# Patient Record
Sex: Male | Born: 1935 | Race: White | Hispanic: No | Smoking: Never smoker
Health system: Southern US, Community
[De-identification: ages and names within clinical notes are randomized; demographics above are authoritative.]

## PROBLEM LIST (undated history)

## (undated) DIAGNOSIS — I639 Cerebral infarction, unspecified: Secondary | ICD-10-CM

## (undated) DIAGNOSIS — E538 Deficiency of other specified B group vitamins: Secondary | ICD-10-CM

## (undated) DIAGNOSIS — K5790 Diverticulosis of intestine, part unspecified, without perforation or abscess without bleeding: Secondary | ICD-10-CM

## (undated) DIAGNOSIS — M109 Gout, unspecified: Secondary | ICD-10-CM

## (undated) DIAGNOSIS — I471 Supraventricular tachycardia, unspecified: Secondary | ICD-10-CM

## (undated) DIAGNOSIS — D369 Benign neoplasm, unspecified site: Secondary | ICD-10-CM

## (undated) DIAGNOSIS — Z86711 Personal history of pulmonary embolism: Secondary | ICD-10-CM

## (undated) DIAGNOSIS — R739 Hyperglycemia, unspecified: Secondary | ICD-10-CM

## (undated) DIAGNOSIS — N189 Chronic kidney disease, unspecified: Secondary | ICD-10-CM

## (undated) DIAGNOSIS — I82409 Acute embolism and thrombosis of unspecified deep veins of unspecified lower extremity: Secondary | ICD-10-CM

## (undated) DIAGNOSIS — I739 Peripheral vascular disease, unspecified: Secondary | ICD-10-CM

## (undated) DIAGNOSIS — E785 Hyperlipidemia, unspecified: Secondary | ICD-10-CM

## (undated) DIAGNOSIS — I10 Essential (primary) hypertension: Secondary | ICD-10-CM

## (undated) DIAGNOSIS — I2510 Atherosclerotic heart disease of native coronary artery without angina pectoris: Secondary | ICD-10-CM

## (undated) DIAGNOSIS — K226 Gastro-esophageal laceration-hemorrhage syndrome: Secondary | ICD-10-CM

## (undated) HISTORY — DX: Gout, unspecified: M10.9

## (undated) HISTORY — DX: Benign neoplasm, unspecified site: D36.9

## (undated) HISTORY — DX: Atherosclerotic heart disease of native coronary artery without angina pectoris: I25.10

## (undated) HISTORY — DX: Acute embolism and thrombosis of unspecified deep veins of unspecified lower extremity: I82.409

## (undated) HISTORY — DX: Supraventricular tachycardia, unspecified: I47.10

## (undated) HISTORY — DX: Peripheral vascular disease, unspecified: I73.9

## (undated) HISTORY — DX: Gastro-esophageal laceration-hemorrhage syndrome: K22.6

## (undated) HISTORY — DX: Diverticulosis of intestine, part unspecified, without perforation or abscess without bleeding: K57.90

## (undated) HISTORY — DX: Personal history of pulmonary embolism: Z86.711

## (undated) HISTORY — PX: PR VEIN BYPASS GRAFT,AORTO-FEM-POP: 35551

## (undated) HISTORY — DX: Chronic kidney disease, unspecified: N18.9

## (undated) HISTORY — PX: APPENDECTOMY: SHX54

## (undated) HISTORY — DX: Cerebral infarction, unspecified: I63.9

## (undated) HISTORY — DX: Deficiency of other specified B group vitamins: E53.8

## (undated) HISTORY — DX: Hyperlipidemia, unspecified: E78.5

## (undated) HISTORY — DX: Hyperglycemia, unspecified: R73.9

## (undated) HISTORY — DX: Essential (primary) hypertension: I10

## (undated) HISTORY — DX: Supraventricular tachycardia: I47.1

---

## 1976-02-29 HISTORY — PX: OTHER SURGICAL HISTORY: SHX169

## 1993-02-28 HISTORY — PX: OTHER SURGICAL HISTORY: SHX169

## 1998-02-10 ENCOUNTER — Ambulatory Visit (HOSPITAL_COMMUNITY): Admission: RE | Admit: 1998-02-10 | Discharge: 1998-02-10 | Payer: Self-pay | Admitting: Cardiology

## 1999-04-09 ENCOUNTER — Ambulatory Visit (HOSPITAL_COMMUNITY): Admission: RE | Admit: 1999-04-09 | Discharge: 1999-04-09 | Payer: Self-pay | Admitting: Geriatric Medicine

## 2000-01-07 ENCOUNTER — Ambulatory Visit (HOSPITAL_COMMUNITY): Admission: RE | Admit: 2000-01-07 | Discharge: 2000-01-07 | Payer: Self-pay | Admitting: Geriatric Medicine

## 2000-03-06 ENCOUNTER — Encounter: Payer: Self-pay | Admitting: *Deleted

## 2000-03-08 ENCOUNTER — Ambulatory Visit (HOSPITAL_COMMUNITY): Admission: RE | Admit: 2000-03-08 | Discharge: 2000-03-08 | Payer: Self-pay | Admitting: *Deleted

## 2000-03-29 ENCOUNTER — Inpatient Hospital Stay (HOSPITAL_COMMUNITY): Admission: RE | Admit: 2000-03-29 | Discharge: 2000-04-02 | Payer: Self-pay | Admitting: *Deleted

## 2000-11-17 ENCOUNTER — Emergency Department (HOSPITAL_COMMUNITY): Admission: EM | Admit: 2000-11-17 | Discharge: 2000-11-17 | Payer: Self-pay | Admitting: *Deleted

## 2004-06-07 ENCOUNTER — Inpatient Hospital Stay (HOSPITAL_COMMUNITY): Admission: EM | Admit: 2004-06-07 | Discharge: 2004-06-11 | Payer: Self-pay | Admitting: Emergency Medicine

## 2004-06-09 ENCOUNTER — Encounter (INDEPENDENT_AMBULATORY_CARE_PROVIDER_SITE_OTHER): Payer: Self-pay | Admitting: *Deleted

## 2004-11-08 ENCOUNTER — Encounter (HOSPITAL_BASED_OUTPATIENT_CLINIC_OR_DEPARTMENT_OTHER): Admission: RE | Admit: 2004-11-08 | Discharge: 2004-12-15 | Payer: Self-pay | Admitting: Surgery

## 2004-11-16 ENCOUNTER — Emergency Department (HOSPITAL_COMMUNITY): Admission: EM | Admit: 2004-11-16 | Discharge: 2004-11-16 | Payer: Self-pay | Admitting: Emergency Medicine

## 2006-03-31 ENCOUNTER — Emergency Department (HOSPITAL_COMMUNITY): Admission: EM | Admit: 2006-03-31 | Discharge: 2006-04-01 | Payer: Self-pay | Admitting: Emergency Medicine

## 2006-04-27 ENCOUNTER — Encounter: Admission: RE | Admit: 2006-04-27 | Discharge: 2006-04-27 | Payer: Self-pay | Admitting: Geriatric Medicine

## 2006-04-27 ENCOUNTER — Inpatient Hospital Stay (HOSPITAL_COMMUNITY): Admission: EM | Admit: 2006-04-27 | Discharge: 2006-05-07 | Payer: Self-pay | Admitting: Emergency Medicine

## 2006-05-13 ENCOUNTER — Emergency Department (HOSPITAL_COMMUNITY): Admission: EM | Admit: 2006-05-13 | Discharge: 2006-05-13 | Payer: Self-pay | Admitting: Emergency Medicine

## 2007-06-14 ENCOUNTER — Ambulatory Visit: Payer: Self-pay | Admitting: *Deleted

## 2007-12-17 ENCOUNTER — Ambulatory Visit: Payer: Self-pay | Admitting: *Deleted

## 2008-07-25 ENCOUNTER — Ambulatory Visit: Payer: Self-pay | Admitting: *Deleted

## 2009-01-27 ENCOUNTER — Ambulatory Visit: Payer: Self-pay | Admitting: Vascular Surgery

## 2009-07-22 ENCOUNTER — Ambulatory Visit: Payer: Self-pay | Admitting: Vascular Surgery

## 2010-07-13 NOTE — Procedures (Signed)
BYPASS GRAFT EVALUATION   INDICATION:  Follow up left leg bypass graft, patient states that he is  asymptomatic.   HISTORY:  Diabetes:  No.  Cardiac:  Arrhythmia.  Hypertension:  Yes.  Smoking:  No.  Previous Surgery:  Left femoral-to-popliteal artery synthetic bypass  graft by Dr. Madilyn Fireman on 03/02/2000.   SINGLE LEVEL ARTERIAL EXAM                               RIGHT              LEFT  Brachial:                    151                152  Anterior tibial:             113                139  Posterior tibial:            107                146  Peroneal:  Ankle/brachial index:        0.74               0.96   PREVIOUS ABI:  Date: 01/27/2009  RIGHT:  0.69  LEFT:  0.96   LOWER EXTREMITY BYPASS GRAFT DUPLEX EXAM:   DUPLEX:  Biphasic Doppler waveforms noted throughout the lower extremity  bypass graft in its native vessels with no increase in velocities.   IMPRESSION:  1. Patent left femoropopliteal bypass graft with no evidence of      stenosis.  2. Stable bilateral ankle brachial indices noted.           ___________________________________________  Quita Skye Hart [place], M.D.   CH/MEDQ  D:  07/22/2009  T:  07/22/2009  Job:  [id number]

## 2010-07-13 NOTE — Procedures (Signed)
BYPASS GRAFT EVALUATION   INDICATION:  Followup left leg bypass graft.   HISTORY:  Diabetes:  No.  Cardiac:  Arrhythmia.  Hypertension:  Yes.  Smoking:  No.  Previous Surgery:  Left femoral to popliteal artery bypass graft with  Gore-Tex on 03/02/2000 by Dr. Madilyn Fireman.   SINGLE LEVEL ARTERIAL EXAM                               RIGHT              LEFT  Brachial:                    170                170  Anterior tibial:             74                 162  Posterior tibial:            124                174  Peroneal:  Ankle/brachial index:        0.73               >1.0   PREVIOUS ABI:  Date: 05/30/2005  RIGHT:  0.68  LEFT:  0.96   LOWER EXTREMITY BYPASS GRAFT DUPLEX EXAM:   DUPLEX:  Doppler arterial waveforms are biphasic proximal to, throughout  and distal to the graft.  Velocities are within normal limits  throughout.   IMPRESSION:  1. Patent left femoral to popliteal artery bypass graft.  2. ABIs are stable bilaterally.      ___________________________________________  P. Liliane Bade, M.D.   DP/MEDQ  D:  06/14/2007  T:  06/14/2007  Job:  [id number]

## 2010-07-13 NOTE — Procedures (Signed)
BYPASS GRAFT EVALUATION   INDICATION:  Follow-up evaluation of lower extremity bypass graft.   HISTORY:  Diabetes:  No.  Cardiac:  Arrhythmia.  Hypertension:  Yes.  Smoking:  No.  Previous Surgery:  Left femoral to popliteal artery bypass graft with  Gore-Tex on 03/02/2000 by Dr. Madilyn Fireman.   SINGLE LEVEL ARTERIAL EXAM                               RIGHT              LEFT  Brachial:                    160                154  Anterior tibial:             85                 140  Posterior tibial:            116                168  Peroneal:  Ankle/brachial index:        0.71               1.02   PREVIOUS ABI:  Date: 12/17/2007  RIGHT:  0.69  LEFT:  0.88   LOWER EXTREMITY BYPASS GRAFT DUPLEX EXAM:   DUPLEX:  Biphasic duplex waveform noted within graft and native  arteries.   IMPRESSION:  1. Patent left fem-pop bypass graft with no evidence of focal      stenosis.  2. Right lower extremity ABI suggests mild to moderate arterial      disease with biphasic Doppler waveform.  3. Normal left lower extremity ABI with biphasic Doppler waveform.   ___________________________________________  P. Liliane Bade, M.D.   AC/MEDQ  D:  07/25/2008  T:  07/25/2008  Job:  [id number]

## 2010-07-13 NOTE — Procedures (Signed)
BYPASS GRAFT EVALUATION   INDICATION:  Follow up left femoral-popliteal artery bypass graft.   HISTORY:  Diabetes:  No  Cardiac:  Arrhythmia  Hypertension:  Yes  Smoking:  No  Previous Surgery:  Left femoral-popliteal artery bypass graft 03/02/2000  by Dr. Madilyn Fireman   SINGLE LEVEL ARTERIAL EXAM                               RIGHT              LEFT  Brachial:                    140                140  Anterior tibial:             85                 124  Posterior tibial:            96                 135  Peroneal:  Ankle/brachial index:        0.69               0.96   PREVIOUS ABI:  Date:  07/25/2008  RIGHT:  0.71  LEFT:  1.02   LOWER EXTREMITY BYPASS GRAFT DUPLEX EXAM:   DUPLEX:  Doppler arterial waveforms appear biphasic proximal to, within,  and distal to left lower extremity bypass graft.   IMPRESSION:  1. Patent left femoral-popliteal artery bypass graft.  2. Stable ABIs bilaterally.  3. No significant changes from previous study.         ___________________________________________  Quita Skye Hart [place], M.D.   AS/MEDQ  D:  01/27/2009  T:  01/28/2009  Job:  [id number]

## 2010-07-13 NOTE — Procedures (Signed)
BYPASS GRAFT EVALUATION   INDICATION:  Follow up left fem-pop bypass graft.   HISTORY:  Diabetes:  No.  Cardiac:  Arrhythmia.  Hypertension:  Yes.  Smoking:  No.  Previous Surgery:  Please see above.   SINGLE LEVEL ARTERIAL EXAM                               RIGHT              LEFT  Brachial:                    152                150  Anterior tibial:             62                 118  Posterior tibial:            105                134  Peroneal:  Ankle/brachial index:        0.69               0.88   PREVIOUS ABI:  Date: 06/14/07  RIGHT:  0.73  LEFT:  >1.0   LOWER EXTREMITY BYPASS GRAFT DUPLEX EXAM:   DUPLEX:  Patent left fem-pop bypass graft with no evidence of focal  stenosis.   IMPRESSION:  1. Patent left femoropopliteal bypass graft with no evidence of focal      stenosis.  2. Mildly abnormal ankle brachial index with biphasic Doppler waveform      noted in the left leg.  3. Moderately abnormal ankle brachial index with monophasic Doppler      waveform noted in the right leg.     ___________________________________________  P. Liliane Bade, M.D.   MG/MEDQ  D:  12/17/2007  T:  12/17/2007  Job:  [id number]

## 2010-07-16 NOTE — Discharge Summary (Signed)
[name], [name]                  ACCOUNT NO.:  [id number]   MEDICAL RECORD NO.:  [id number]          PATIENT TYPE:  INP   LOCATION:  1406                         FACILITY:  [facility]   PHYSICIAN:  Andres Shad. Rudean Curt, MD     DATE OF BIRTH:  02-05-1928   DATE OF ADMISSION:  04/27/2006  DATE OF DISCHARGE:  05/07/2006                               DISCHARGE SUMMARY   DISCHARGE DIAGNOSES:  1. Pulmonary embolism.  2. Gastrointestinal bleed due to Mallory-Weiss tear.   DISCHARGE MEDICATIONS:  1. Coumadin 3 mg p.o. daily.  2. Omeprazole 20 mg p.o. b.i.d.  3. Metoprolol XL 50 mg p.o. daily.  4. Aspirin 81 mg p.o. daily.  5. Red yeast rice extract once daily.   SUMMARY OF HOSPITALIZATION:  [name] is a 77 year old male with a  history of hypertension, dyslipidemia, supraventricular tachycardia and  peripheral vascular disease, who was admitted to the hospital with a  pulmonary embolism on April 27, 2006.  The details of his admission  can be found in his history and physical.  Briefly, the patient was evaluated as an outpatient for pleuritic chest  pain especially under the lower ribs of his right flank. This had  worsened over the course of 1 week.  As part of his outpatient  evaluation he had a CT angiogram of the chest that was positive for  bilateral pulmonary emboli. He never had an oxygen requirement during  his admission. He was admitted to a monitored floor bed where he  received low molecular weight heparin and began on the Coumadin  protocol.  His pulmonary embolism was complicated only by pleuritic  chest pain which was well controlled with Dilaudid.   The  patient's hospital course was complicated by an episode of upper GI  bleeding which occurred on March 5th at which time the patient had an  episode of hematemesis and melena.  He was hemodynamically stable but  felt very clammy and uncomfortable at the time. He was evaluated by  gastroenterology who did an upper endoscopy  and found that he was  bleeding from a Mallory-Weiss tear in his esophagus. This was most  likely due to some episodes of coughing related to the pulmonary  embolism.  He was begun on twice daily proton pump inhibitors and his  Coumadin was briefly held.  On March 7th the patient had a sudden drop  in his hemoglobin from 9.2 to 8.1. This was managed with a transfusion.  He had a second endoscopy by gastroenterology which again revealed only  the Mallory-Weiss tear. Gastroenterology felt it would be safe to  continue anticoagulation him and proton pump inhibitor therapy should be  instituted while the patient receives Coumadin.   The other complication during his hospitalization was an episode of  arthritis in his left knee. The patient states that he has a history of  gout although he has never had gout in the knee before. This occurred  over March 4 through March 8th more or less. It was associated with  swelling, tenderness, stiffness and warmth in the left knee.  A serum  uric acid was slightly elevated at 7.5. The patient received colchicine  and prednisone by mouth, however, this had to be interrupted because of  the GI bleeding which happened around the same time. By the time of  discharge the patient was on no specific therapy for his knee and his  symptoms had entirely resolved.   ASSESSMENT AND PLAN:  1. Pulmonary embolism. The patient will be discharged on Coumadin 3 mg      daily and he has been instructed to follow-up with Dr. Pete Glatter      within the next 2-3 days for a follow-up INR.  He will need to      receive 6 months of therapy with Coumadin as long as he has no      other indications for anticoagulation.  2. Upper gastrointestinal bleed. Per Dr. Rogers Seeds recommendations      the patient will receive omeprazole 20 mg once daily for the      duration of his Coumadin therapy.      Andres Shad. Rudean Curt, MD  Electronically Signed     PML/MEDQ  D:  05/07/2006  T:   05/07/2006  Job:  [id number]   cc:   Hal T. Stoneking, M.D.  Fax: [phone]   Danise Edge, M.D.  Fax: [phone]

## 2010-07-16 NOTE — H&P (Signed)
NAMEILIJA, MAXIM                  ACCOUNT NO.:  [id number]   MEDICAL RECORD NO.:  [id number]          PATIENT TYPE:  INP   LOCATION:  0103                         FACILITY:  [facility]   PHYSICIAN:  Andres Shad. Rudean Curt, MD     DATE OF BIRTH:  1927-11-21   DATE OF ADMISSION:  04/27/2006  DATE OF DISCHARGE:                              HISTORY & PHYSICAL   CHIEF COMPLAINT:  Pulmonary embolism.   HISTORY OF PRESENT ILLNESS:  [name] is a 77 year old male with a past  medical history of hypertension, dyslipidemia, supraventricular  tachycardia, and peripheral vascular disease who is referred by Dr.  Valentina Lucks for admission for a new diagnosis of pulmonary embolism.  The  patient was well until approximately 3 weeks ago, when he developed what  he described as a sinus infection, this resolved.  Then, approximately  one week ago the patient began to develop pleuritic chest pain  especially under the lower ribs on his right flank.  This progressively  worsened over the subsequent week, and then on the day before admission  he began to develop similar symptoms on the left side.  He denied cough  or shortness of breath.  However, his wife feels that he has been short  of breath.  He has not had any syncopal episodes and has not had any  episodes of SVT during this week of symptoms.  He also denies leg  swelling.   REVIEW OF SYSTEMS:  As above.  The patient denies any recent long car  trips or plane trips.   PAST MEDICAL HISTORY:  As above.   CURRENT MEDICATIONS:  1. Toprol 50 mg daily.  2. Aspirin 81 mg daily.  3. Red yeast rice extracts daily.   PHYSICAL EXAMINATION:  VITAL SIGNS:  Temperature 97.2, blood pressure  186/93, heart rate 85, respiratory rate 16, pulse ox 98% on room air.  GENERAL APPEARANCE:  The patient was alert in no apparent distress.  HEENT:  Mucous membranes were moist.  No JVD.  No carotid bruits.  LUNGS:  Significant only for decreased breath sounds at the right base.  Otherwise, they were clear to auscultation bleeding.  CARDIOVASCULAR:  Regular.  Normal S1 and S2.  No murmurs, rubs, or  gallops.  ABDOMEN:  Normal bowel sounds.  Soft, nontender, nondistended.  No  organomegaly.  EXTREMITIES:  There is an old scar on his left eye from an arterial  graft from his peripheral vascular disease.  Otherwise there was no  edema and no calf tenderness.   LABORATORY STUDIES:  White blood cell count 9.5, hemoglobin 14.7,  hematocrit 42.6, platelet count 228, differential was normal.  INR was  1.1.  PTT was 34.  Electrolytes were within normal limits.  His  creatinine was 1.2.  CT angiogram showed pulmonary emboli in both lower  lobes with the right greater than the left.  There was also a small  embolus in the left upper lobe.  An opacity was seen in the right lower  lobe that was consistent with either atelectasis, pneumonia, or a  possible developing  lung infarction, small effusions were seen on both  sides with the left greater than the right.   ASSESSMENT/PLAN:  1. Bilateral pulmonary emboli.  The patient will be placed on      telemetry.  He had already begun therapeutic dose Lovenox.  I will      begin on the Coumadin protocol tomorrow.  I will also contact the      pulmonary critical care service to see if he can be enrolled in      their anticoagulation study.  2. Hypertension.  The patient will continue on his metoprolol,      however, his blood pressure is quite elevated now and I will give      him an additional dose to try and bring his blood pressure under      control.  3. Supraventricular tachycardia.  The patient's rate and rhythm are      normal at the moment.  He will be kept on telemetry.  4. Hyperlipidemia.  The patient's family will be allowed to give the      patient his red yeast rice extract as this is not on the      hospitalist formulary.  5. Peripheral vascular disease/coronary artery disease.  The patient      will continue  aspirin.      Andres Shad. Rudean Curt, MD  Electronically Signed     PML/MEDQ  D:  04/27/2006  T:  04/27/2006  Job:  [id number]   cc:   Hal T. Stoneking, M.D.  Fax: [phone]

## 2010-07-16 NOTE — Discharge Summary (Signed)
NAMELAMAJ, METOYER                  ACCOUNT NO.:  [id number]   MEDICAL RECORD NO.:  [id number]          PATIENT TYPE:  INP   LOCATION:  5524                         FACILITY:  [facility]   PHYSICIAN:  Deirdre Peer. Polite, M.D. DATE OF BIRTH:  12/02/1927   DATE OF ADMISSION:  06/07/2004  DATE OF DISCHARGE:  06/11/2004                                 DISCHARGE SUMMARY   DISCHARGE DIAGNOSES:  1.  Diverticulitis, improved at discharge.  Tolerating p.o. intake without      nausea or vomiting.  2.  Coronary artery disease.  Please note patient has had an abnormal stress      Cardiolite during this hospitalization.  Patient requesting discharge      for further outpatient followup.  Patient has been cleared for discharge      by Dr. Garnette Scheuermann for further outpatient followup with Dr. Amil Amen.  3.  History of supraventricular tachycardia, none documented this      hospitalization.  4.  Syncope probably secondary to vasovagal as a result of abdominal pain.  5.  Abnormal stress test suggesting renal artery stenosis.  Please note MRA      is negative for any renal artery stenosis.   DISCHARGE MEDICATIONS:  1.  Cipro 500 mg one p.o. b.i.d.  2.  Flagyl 500 mg one p.o. q.8h.  3.  Percocet one to two tablets q.6-8h. p.r.n.  4.  Patient is asked to resume other home medications which include aspirin.  5.  Toprol XL 50 mg one p.o. daily.   DISPOSITION:  Patient is being discharged to home in stable condition.  Asked to follow up with Dr. Pete Glatter one week.  Asked to follow up with Dr.  Amil Amen in approximately four days.   STUDIES:  1.  Patient had a CT of the abdomen and pelvis showed diverticulitis without      abscess or perforation, stranding in the mesentery about the sigmoid      colon.  CT also was suggestive of a possible renal artery stenosis.  2.  Patient had an MRA of the abdomen which showed patent single renal      arteries bilaterally with only very slight narrowing of the right renal     ostium which is not significant.  3.  Patient had a Cardiolite stress test.  Showed an ejection fraction of      74%.  Study was abnormal demonstrated stress-induced myocardial ischemia      in the septum and lateral walls.  4.  Patient's EKG within normal limits.  No change compared to September      2002.  Patient had a 2-D echocardiogram, ejection fraction 55-65%.  5.  CBC, BMET within normal limits.  Cardiac enzymes within normal limits.      TSH 5.2.  Urinalysis negative.   HISTORY OF PRESENT ILLNESS:  A 77 year old male with multiple medical  problems who presented to the ED after having a syncopal spell.  Patient had  been experiencing pain in the left lower quadrant of the abdomen and then  had sensation of chills and  fever and had a syncopal spell.  In the ED the  patient was evaluated.  Ultimately the patient was determined to have  diverticulitis.  Admission was deemed necessary for further evaluation and  treatment.  Also of note the patient states that he had been having some  increased frequency of his SVT on an outpatient basis which he had been able  to ablate on his own with Valsalva maneuver.  Admission was deemed necessary  for further evaluation and treatment.  Please see dictated admission H&P for  further details.   PAST MEDICAL HISTORY:  As stated above.   MEDICATIONS:  Per admission H&P.   PAST SURGICAL HISTORY:  Per admission H&P.  Significant for appendectomy,  history of left femoral popliteal bypass in January 2002, coronary  angioplasty by Dr. Amil Amen in 1996.   ALLERGIES:  Reported intolerance to STATIN and ZETIA.   FAMILY HISTORY:  Positive for stroke.  Father died suddenly in his 78s.  Mother history of bladder CA.   REVIEW OF SYSTEMS:  As stated in initial HPI.   HOSPITAL COURSE:  1.  - DIVERTICULITIS:  Patient was admitted to a medicine floor bed for      evaluation and treatment of diverticulitis.  Patient was treated in a      typical  fashion with IV fluids, bowel rest, antibiotics.  Patient's      diverticulitis improved to the point that he was tolerating p.o. intake.      Patient did have bowel movements within the hospital without any signs      of blood.  Patient will require total 14 days of antibiotics for his      diverticulitis.   1.  - ABNORMAL CT SUGGESTING RIGHT RENAL ARTERY STENOSIS:  As stated in the      data section, patient underwent MRA of the renal arteries which did not      show significant renal artery stenosis.  No further evaluation was      taken.   1.  - HISTORY OF SUPRAVENTRICULAR TACHYCARDIA WITH QUESTIONABLE INCREASED      FREQUENT OF EVENTS ON OUTPATIENT BASIS:  Based on this data patient was      seen in consultation by cardiology.  Patient underwent a stress      Cardiolite.  Patient's EF by Cardiolite was within normal limits;      however, it did show reversible ischemia in the septum and the lateral      walls.  In addition, the patient was seen in consultation by EP for      questionable increased frequency of SVT.  At this time the patient did      not want to pursue any further evaluation with EP as far as his SVT.      According to the patient he and Dr. Amil Amen have talked about this      before in the past and as long as his events are not that frequent and      he is able to stop them on his own with Valsalva, no further studies      were indicated.  Also, the patient has been informed by the abnormal      stress test.  At this time patient states that he did not ever have any      trouble with the chest pain and really would like to be discharged and      further managed on an outpatient basis if possible  with Dr. Amil Amen.      Patient has been seen today on rounds by Dr. Garnette Scheuermann and at this time      patient is going to be cleared __________      to be discharged to home with further outpatient followup with Dr.     Amil Amen.  Please note the patient has not had any  trouble with chest      discomfort during this hospitalization, no arrhythmias, and no change in      his EKG.  Patient will continue current outpatient medications and      follow up with Dr. Amil Amen on an outpatient basis.      RDP/MEDQ  D:  06/11/2004  T:  06/11/2004  Job:  [id number]   cc:   Hal T. Stoneking, M.D.  [address] [place], [place] [address]  Fax: [phone]

## 2010-07-16 NOTE — Op Note (Signed)
[place]. [facility]  Patient:    QUNICY, HIGINBOTHAM                           MRN: [id number] Proc. Date: 03/29/00 Adm. Date:  [id number] Attending:  Melvenia Needles CC:         Hal T. Stoneking, M.D.   Operative Report  PREOPERATIVE DIAGNOSIS:  POSTOPERATIVE DIAGNOSIS:  OPERATION PERFORMED: 1. Left common femoral endarterectomy and Dacron patch angioplasty. 2. Left femoral popliteal bypass with 6 mm stretch Gore-Tex graft.  SURGEON:  Denman George, M.D.  ASSISTANT:  ANESTHESIA:  INDICATIONS FOR PROCEDURE:  The patient is a 77 year old male referred for evaluation of claudication of the left lower extremity.  Evaluation revealed a severe stenosis at the left external iliac-common femoral junction and a proximal left superficial femoral artery occlusion.  Reconstitution of the above knee popliteal artery with continuous run-off below this.  The patient was brought to the operating room at this time for left lower extremity revascularization.  DESCRIPTION OF PROCEDURE:  The patient was brought to the operating room in stable condition.  Placed in supine position.  General endotracheal anesthesia induced.  Left leg prepped and draped in sterile fashion.  Longitudinal skin incision made through the left groin.  Dissection carried down through the subcutaneous tissues and lymphatics with electrocautery.  The common femoral artery was exposed proximally at the inguinal ligament, mobilized and encircled with a vessel loop.  The inguinal ligament then mobilized and then the external iliac artery exposed proximal to this and encircled with a vessel loop.  The area of stenosis in the common femoral-external iliac junction could be easily palpated.  Above this, the artery was soft.  The dissection carried distally along the common femoral artery to expose the superficial femoral and profunda origins which were also encircled with vessel loops.  A  second longitudinal skin incision was made along the sartorial groove distal left thigh.  Dissection carried through the subcutaneous tissues.  The fascia incised.  The popliteal artery exposed just distal to the adductor canal and encircled with a vessel loop.  The artery revealed some plaque disease but was generally soft.  A subsartorial tunnel then created between the two incisions.  A 6 mm stretch graft placed through the tunnel.  Patient then administered 5000 units of heparin intravenously.  Adequate circulation time permitted.  The left groin vessels were then controlled with clamps.  A longitudinal arteriotomy was then made in the common femoral artery.  This arteriotomy was then extended proximally up into the external iliac artery.  A large exophytic plaque was identified at the external iliac-common femoral junction.  This was endarterectomized.  A preclotted knitted Dacron patch was then placed over the arteriotomy with running 6-0 Prolene suture.  The clamp was then removed. Excellent flow present.  The clamps were then replaced.  The patch angioplasty was opened longitudinally.  The 6 mm Gore-Tex was anastomosed end-to-side to the Dacron patch with running 6-0 Prolene suture.  Clamps were then removed from this and excellent flow was present through the graft which was controlled with the fistula clamp.  The exposed popliteal artery was then controlled proximally and distally with Glover clamps.  A longitudinal arteriotomy made.  The lumen was widely patent. There was some plaque posterolaterally.  The Gore-Tex graft was beveled and anastomosed end-to-side to the popliteal artery with running 6-0 Prolene suture.  Clamps were then removed.  Excellent flow  present.  Adequate hemostasis obtained.  Sponge and instrument counts were correct.  The wound was irrigated with antibiotic solution.  The left groin incision closed with a deep layer of running 2-0 Vicryl suture and  subcutaneous superficial layer of running 3-0 Vicryl suture.  Skin closed with 3-0 Monocryl.  Half-inch Steri-Strips applied.  The distal thigh incision closed with a deep layer of interrupted 2-0 Vicryl suture, subcutaneous layer of running 3-0 Vicryl suture.  Skin closed with 4-0 Monocryl.  Half-inch Steri-Strips applied.  The patient transferred to recovery room in stable condition. DD:  03/29/00 TD:  03/29/00 Job: [id number]

## 2010-07-16 NOTE — Discharge Summary (Signed)
[place]. [facility]  Patient:    [name], [name]                           MRN: [id number] Adm. Date:  [id number] Disc. Date: [id number] Attending:  Melvenia Needles Dictator:   Lissa Hoard, P.A.-C. CC:         Hal T. Stoneking, M.D.   Discharge Summary  DATE OF BIRTH:  02-Apr-1927  ADMITTING DIAGNOSIS:  Left lower extremity claudication.  SECONDARY DIAGNOSES: 1. Peripheral vascular occlusive disease. 2. Hypertension. 3. Hypercholesterolemia.  DISCHARGE DIAGNOSES: 1. Peripheral vascular occlusive disease. 2. Status post left femoropopliteal bypass.  HOSPITAL PROCEDURES: 1. Left common femoral endarterectomy. 2. Left femoropopliteal bypass graft. 3. Postoperative ankle-brachial indexes.  HOSPITAL COURSE:  Mr. Lyssy was admitted to [facility], March 29, 2000, secondary to lower extremity claudication.  On this date, he underwent a left femoropopliteal bypass graft performed by Dr. Madilyn Fireman under general endotracheal anesthesia.  No complications were noted during the procedure. The patients postoperative course was uncomplicated, and he was discharged home in stable and satisfactory condition on April 01, 2000.  DISCHARGE MEDICATIONS: 1. Tylox 1-2 tablets every 4-6 hours as needed for pain. 2. Monopril 10 mg 1 tablet daily. 3. Toprol XL 25 mg 1 tablet daily. 4. Aspirin 325 mg 1 tablet daily. 5. Lipitor 40 mg 1 tablet daily.  DISCHARGE ACTIVITY:  The patient is told to avoid driving or strenuous activity.  DIET:  Low fat, low salt.  WOUND CARE:  The patient was told he could shower and clean his incisions with soap and water.  DISPOSITION:  Home.  FOLLOW-UP:  The patient is told to follow up with Dr. Madilyn Fireman at his office on April 17, 2000, at 11:50 a.m. DD:  03/31/00 TD:  04/01/00 Job: [id number]

## 2010-07-16 NOTE — Op Note (Signed)
NAME:  [name], FORE NO.:  [id number]   MEDICAL RECORD NO.:  [id number]          PATIENT TYPE:  LINP   LOCATION:                               FACILITY:  [facility]   PHYSICIAN:  Petra Kuba, M.D.    DATE OF BIRTH:  19-Dec-1927   DATE OF PROCEDURE:  05/03/2006  DATE OF DISCHARGE:                               OPERATIVE REPORT   PROCEDURE:  Esophagogastroduodenoscopy with clipping and injection  therapy.   INDICATIONS:  Upper GI bleeding.  Consent was signed after risks,  benefits, methods, options thoroughly, discussed prior to any sedation.   MEDICINES USED:  Fentanyl 60 mcg, Versed 6 mg.   DESCRIPTION OF PROCEDURE:  The video endoscope was inserted by direct  vision.  The esophagus was normal.  There was a little bit of fresh  blood in the distal esophagus, but no signs of active bleeding from the  esophagus.  He might have had a tiny hiatal hernia.  There was a stomach  filled with blood; and we were carefully able to advance around it to  the antrum.  Blood was washed off the antrum which was normal through a  normal pylorus.  There was fresh blood in the bulb which was washed off  and was normal and the scope was advanced around the C-loop to a normal  second portion of the duodenum.   The scope was withdrawn back to the stomach and retroflexed high in the  cardia.  At the GE junction there was an obvious significant Mallory  Weiss tear with some oozing.  The rest of the stomach was filled with  blood; and was unable to be evaluated.  We went ahead and injected  1:10,000 epinephrine x2 injections 2 mL on the first injection and 1 mL  on the second with nice cessation of the oozing.  We then went ahead and  place 3 Endo clips onto the [employer] tear without any obvious  complication or problems.  The area was washed and was not to be made to  bleed.  Air was suctioned.  The scope was straightened, and straight  visualization did show the [employer]  tear to be more in the cardia  than actually in the esophagus just below the GE junction.  You could  see the clips on straight visualization, but could not see the area of  the Mallory-Weiss tear.  The scope was further withdrawn.  No other  abnormalities were seen in the esophagus.  The scope was removed.  The  patient tolerated the procedure well.  There was no obvious immediate  complications.   ENDOSCOPIC DIAGNOSIS:  1. Significant Mallory Weiss tear in the cardia with some oozing.  2. Stomach with increased blood and clots not evaluated.  3. Normal antrum, pylorus, bulb, and second portion of the duodenum      therapy 1:10,000 epinephrine on two injections, 3 mL total, and      three resolution endoclips placed with good resolution of bleeding.   PLAN:  Hold aspirin and Lovenox watch H&H, BUN, PT  closely.  Will  increase pump inhibitors to b.i.d., keep on clear liquids, rescope  p.r.n.  Probably would do that after reversing the Coumadin.  Possibly  if continual bleeding,  might need to be evaluated for a vena cava umbrella although without  obvious cause of his pulmonary embolus and no signs of DVT; doubt that  that would be rewarding.  Questionably needs a hard echocardiogram,  abdominal CT to rule out other etiologies.           ______________________________  Petra Kuba, M.D.     MEM/MEDQ  D:  05/03/2006  T:  05/03/2006  Job:  [id number]   cc:   Andres Shad. Rudean Curt, MD   Danise Edge, M.D.  Fax: [phone]   Hal T. Stoneking, M.D.  Fax: [phone]

## 2010-07-16 NOTE — Op Note (Signed)
[name], [name]                  ACCOUNT NO.:  [id number]   MEDICAL RECORD NO.:  [id number]          PATIENT TYPE:  INP   LOCATION:  1406                         FACILITY:  [facility]   PHYSICIAN:  [name], M.D.   DATE OF BIRTH:  1927/11/11   DATE OF PROCEDURE:  05/05/2006  DATE OF DISCHARGE:                               OPERATIVE REPORT   PROCEDURE:  Esophagogastroduodenoscopy.   PROBLEM:  Mallory-Weiss tear.   HISTORY:  [name] (date of birth [birth year]) was admitted to  [facility] April 27, 2006 to evaluate and treat acute  bilateral pulmonary emboli demonstrated by CT scan of the chest.  The  patient developed hematemesis in the hospital and underwent an  esophagogastroduodenoscopy May 03, 2006 which revealed a Mallory-Weiss  tear at the esophagogastric junction and a stomach filled with blood.   [name] is on Coumadin.  Yesterday, his prothrombin time INR was 3.4.  Today's prothrombin time INR was 3.7.  Hemoglobin yesterday ranged  between 9.4 and 9.8.  This morning's hemoglobin was down to 8.1.   [name] has not had any further episodes of vomiting or hematemesis.  His stool is nonmelenic.  His blood pressure and pulse are normal.   MEDICATION ALLERGIES:  None.  [name] IS INTOLERANT OF STATINS AND  ZETIA.   MEDICATIONS:  Lopressor, Tylenol, Protonix, Zofran, Ambien, oxycodone,  Coumadin.   PAST MEDICAL HISTORY:  1. Claudication secondary to peripheral vascular disease.  2. In 2002, left femoral popliteal bypass surgery.  3. Hypertension.  4. Dyslipidemia.  5. History of paroxysmal supraventricular tachycardia.  6. Gout.  7. Coronary artery disease.  8. Coronary angioplasty.  9. Appendectomy.  10.Rectal cyst removed.  11.Hospitalization, 2006 for diverticulitis demonstrated by CT of the      abdomen and pelvis.  12.February 2007, colonoscopy revealed sigmoid colonic diverticulosis;      two 1 mm ascending colon polyps were  removed.   ENDOSCOPIST:  Dr. Reece Johnson   PREMEDICATION:  Fentanyl 50 mcg, Versed 6 mg.   PROCEDURE:  After obtaining informed consent, [name] was placed in the  left lateral decubitus position.  I administered intravenous fentanyl  and intravenous Versed to achieve conscious sedation for the procedure.  The patient's blood pressure, oxygen saturation and cardiac rhythm were  monitored throughout the procedure and documented in the medical record.   The Pentax therapeutic gastroscope was passed through the posterior  hypopharynx into the proximal esophagus without difficulty.  The  hypopharynx and larynx appeared normal.  I did not visualize the vocal  cords.   Esophagoscopy:  The proximal and mid segments of the esophageal mucosa  appear normal.  There is a healing Mallory-Weiss tear at the  esophagogastric junction best visualized on retroflexion.  The  previously placed Endo-Clips are no longer present.  There is no  bleeding from the Mallory-Weiss tear despite rubbing the gastroscope  against the lesion.  There is no signs of recent bleeding.  Further  therapy of the Mallory-Weiss tear was not performed.   Gastroscopy:  Retroflex view  of the gastric cardia and fundus was normal  except for the visualizes Mallory-Weiss tear at the esophagogastric  junction.  The gastric body, antrum, and pylorus appear completely  normal.  There is no blood in the stomach.   Duodenoscopy:  The duodenal bulb and descending duodenum appear normal.   ASSESSMENT:  Normal esophagogastroduodenoscopy except for the presence  of a healing Mallory-Weiss tear.  This lesion appears to be at low risk  for rebleeding.   RECOMMENDATIONS:  1. I would continue a proton pump inhibitor for approximately 2 weeks      and discontinue the proton pump inhibitor unless it is used to      treat gastroesophageal reflux.  2. I would not completely reverse his prolonged prothrombin time with      fresh frozen  plasma.  At this point, with a prothrombin time of      3.7, I think this will become therapeutic by simply feeding the      patient.  3. Adjust Coumadin to maintain a prothrombin time INR 2-3 range and      probably continue the Coumadin for 6 months.  Coumadin monitoring      can be performed by Dr. Merlene Johnson in the office.  4. I will resume [name] regular diet and check a hemoglobin and      prothrombin time later on this afternoon and in the morning.           ______________________________  [name], M.D.     MJ/MEDQ  D:  05/05/2006  T:  05/05/2006  Job:  [id number]   cc:   Hal T. Stoneking, M.D.  Fax: [phone]

## 2010-07-16 NOTE — Cardiovascular Report (Signed)
[place]. [facility]  Patient:    [name], [name]                           MRN: [id number] Proc. Date: 03/08/00 Adm. Date:  [id number] Disc. Date: [id number] Attending:  Melvenia Needles CC:         [facility]   Cardiac Catheterization  DIAGNOSIS:  Left lower extremity claudication.  SURGEON:  Denman George, M.D.  PROCEDURE:  Mid abdominal aortogram with bilateral lower extremity runoff arteriography.  ACCESS:  Left common femoral artery.  COMPLICATIONS:  None apparent.  CLINICAL NOTE:  This is a 77 year old male with a history of left lower extremity claudication.  Doppler evaluation reveals evidence of left iliac occlusive disease.  He is brought to the [facility]ipheral [facility] at this time for diagnostic arteriography and possible intervention.  DESCRIPTION OF PROCEDURE:  The patient was brought to the catheterization lab in stable condition, placed in the supine position.  Both groins prepped and draped in a sterile fashion.  Administered 2 mg of Nubain, 1 mg of Versed intravenously.  Skin and subcutaneous tissue in left groin instilled with 1% Xylocaine.  A needle easily introduced in the left common femoral artery.  A 0.035 J wire passed through the needle into the mid abdominal aorta.  A 5-French sheath advanced over the guidewire in the left groin.  A standard pigtail catheter was then advanced over the guidewire into the suprarenal aorta.  Standard AP mid abdominal aortogram obtained.  The renal arteries were single bilaterally without significant stenosis.  The infrarenal aorta revealed mild atherosclerotic irregularity without dominant stenosis.  The common iliac arteries bilaterally were widely patent.  Mild plaque disease noted at the origin of the right common iliac artery without rate limiting stenosis.  A pigtail catheter was then brought down to the aortic bifurcation.  Standard AP  pelvic arteriogram was obtained.  This revealed moderate atherosclerotic irregularity of the common iliac arteries bilaterally.  The internal and external iliac arteries were widely patent with brisk flow.  The left external iliac common femoral artery junction revealed a high-grade stenosis estimated to be 70 to 80%.  The right lower extremity revealed widely patent common femoral artery.  The profunda femoris artery was normal.  The right superficial femoral artery revealed mild atherosclerotic irregularity without dominant stenosis.  There was continuous flow to the right popliteal artery.  The right tibioperoneal trunk was widely patent.  The right anterior tibial artery was severely diseased and provided minimal flow to the distal right lower extremity. Dominant flow to the distal right lower extremity was via the posterior tibial and peroneal arteries.  The left lower extremity revealed evidence of a high-grade stenosis at the left external iliac common femoral junction.  The left common femoral artery revealed moderate atherosclerotic irregularity beyond this.  The left profunda femoris artery was patent.  The left superficial femoral artery was occluded at its origin.  Extensive profunda collaterals then reconstituted the above-knee popliteal artery which then flowed continuously to the trifurcation.  Dominant left lower extremity runoff was via the peroneal and posterior tibial arteries.  The left anterior tibial artery was occluded just beyond its origin.  This completed the arteriogram procedure.  There were no apparent complications.  The guidewire was then reinserted and the pigtail catheter removed.  The left femoral sheath was removed.  FINAL IMPRESSION: 1. Single, widely patent renal arteries bilaterally.  2. Mild infrarenal aortic atherosclerosis. 3. High-grade left external iliac common femoral junction stenosis. 4. Left superficial femoral artery occlusion. 5. Mild  tibial vessel occlusive disease.  DISPOSITION:  This results have been discussed with the patient and family members.  The patient will follow up in the office and review options for further treatment at that time. DD:  03/21/00 TD:  03/21/00 Job: [id number]

## 2010-07-16 NOTE — Consult Note (Signed)
[name], [name]                  ACCOUNT NO.:  [id number]   MEDICAL RECORD NO.:  [id number]          PATIENT TYPE:  INP   LOCATION:  5524                         FACILITY:  [facility]   PHYSICIAN:  Armanda Magic, M.D.     DATE OF BIRTH:  [birth year]   DATE OF CONSULTATION:  06/09/2004  DATE OF DISCHARGE:                                   CONSULTATION   CHIEF COMPLAINT:  Syncope.   This is a very pleasant 77 year old white male who has a history of  hypertension and also a history of SVT, hyperlipidemia, peripheral vascular  disease, and is status post left fem-pop bypass.  He has a history of  coronary artery disease and a history of coronary angioplasty in 1996 by Dr.  Corliss Marcus.  He says his last stress test by Dr. Amil Amen was several years  ago.  He presented on June 07, 2004 with 24 hours of chills and left upper  quadrant pain and was noted to have a leukocytosis and a low-grade fever.  Apparently, the patient was noted to have increased heart rate earlier in  the day.  Apparently, the patient told the admitting physician that he was  having palpitations, but in talking with him he says he has not had any  palpitations in over a months.  He said he was in his usual state of health  and then all of a sudden became nauseated, broke out in a sweat, and then  passed out.  He says he does not remember passing out, but a co-worker  actually saw him pass out completely.  Some co-workers had reported to his  daughter apparently that he had been complaining of some chest heaviness and  racing heartbeats, although the patient completely denies this.   PAST SURGICAL HISTORY:  1.  Status post appendectomy.  2.  Status post left femoral-popliteal bypass in January 2002 by Dr. Balinda Quails.  3.  Coronary angioplasty by Dr. Amil Amen in 1996.  4.  Rectal cyst removed in the past.   MEDICATIONS ON ADMISSION:  1.  Toprol XL 50 mg daily.  2.  Aspirin 81 mg daily.  3.  Red rice  yeast.   ALLERGIES:  Cannot tolerate STATINS or ZETIA.   FAMILY HISTORY:  Positive for stroke in his mother.  His father died  suddenly in his 1s.  His mother had bladder CA.   PAST MEDICAL HISTORY:  1.  Coronary artery disease.  2.  SVT.  3.  Hyperlipidemia.  4.  Peripheral vascular disease.  5.  Hypertension.  6.  Newly diagnosed diverticulitis.   REVIEW OF SYSTEMS:  Other that what is stated in the HPI, includes some  right flank pain.  He had stated to his admitting doctor he was having chest  pressure, although he denies any of that.  His last episode of SVT was about  a month ago.  He has also had some left lower quadrant pain.   PHYSICAL EXAMINATION:  VITAL SIGNS:  Blood pressure 151/63; pulse 62;  respirations 18.  He is afebrile.  GENERAL:  He is a well-developed, well-nourished male in no acute distress.  HEENT:  Benign.  NECK:  Supple, without lymphadenopathy.  Carotid upstrokes +2 bilaterally,  no bruits.  LUNGS:  Clear to auscultation throughout.  HEART:  Regular rate and rhythm.  No murmurs, rubs, or gallops.  Normal S1,  S2.  ABDOMEN:  Soft, nontender, nondistended, with normoactive bowel sounds.  No  hepatosplenomegaly.  EXTREMITIES:  No edema.   EKG shows sinus rhythm at 61 beats per minute, with nonspecific ST  abnormality.   LABORATORIES:  CBC:  White cell count 7.8, hemoglobin 13.9, hematocrit 39.4,  platelet count 168.  Sodium 139, potassium 4, chloride 106, CO2 28, glucose  127, BUN 9, creatinine 1.3.  TSH 5.274.  Troponin 0.01, CPK 39 and 0.5.  Cardiac markers x 2 showed CPK-MB of less than 1, troponin less than 0.05,  normal myoglobin.  The second set of cardiac markers showed a CPK-MB of less  than 1, troponin of less than 0.05, and a myoglobin of 118.  TSH is normal  at 5.274.   ASSESSMENT:  1.  Syncopal episode, which sounds vasovagal.  The patient denies any chest      pain or palpitations prior to the event to me, although co-workers       actually said he was complaining of chest pain and palpitations.  EKG in      the hospital is nonischemic.  2.  History of coronary disease, with PCI in 1996.  3.  History of hypertension.  4.  Peripheral vascular disease.  5.  Recent diagnosis of diverticulitis.   PLAN:  1.  Would recommend stress Cardiolite study to rule out inducible ischemia,      given that he is 10 years out from his angioplasty and has not had a      stress test in several years, and there is also a question as to whether      the patient has been having chest pain.  2.  Check a 2D echocardiogram to rule out structural heart disease.  3.  Carotid arterial Dopplers, if not done already.  4.  Continue telemetry monitoring.       TT/MEDQ  D:  06/09/2004  T:  06/09/2004  Job:  [id number]   cc:   Candyce Churn, M.D.  [address] [place]  [place] [address]  Fax: [phone]

## 2010-07-16 NOTE — H&P (Signed)
NAMEACHERON, SUGG NO.:  [id number]   MEDICAL RECORD NO.:  [id number]          PATIENT TYPE:  INP   LOCATION:  5524                         FACILITY:  [facility]   PHYSICIAN:  Candyce Churn, M.D.DATE OF BIRTH:  09-Mar-1927   DATE OF ADMISSION:  06/07/2004  DATE OF DISCHARGE:                                HISTORY & PHYSICAL   CHIEF COMPLAINT:  Abdominal pain and chills.   HISTORY OF PRESENT ILLNESS:  [name] is a pleasant 77 year old male  with a history of hypertension, history of SVT, hypercholesterolemia,  peripheral vascular disease, status post left femoral-popliteal bypass,  gout, coronary artery disease with history of coronary angioplasty in 1996  per Dr. Francisca December, diverticulosis. He presents with about 24 hours of  chills and left upper quadrant pain and now has leukocytosis and low grade  fever. The patient had an increased heart rate earlier today as noted by  palpitations and passed out at work. He apparently was out for about a  minute and he had nausea prior to passing out. Some coworkers have reported  to his daughter, apparently, that he has been complaining of some chest  heaviness and racing heart beats. The chest heaviness is similar to chest  heaviness that he had approximately 10 years ago when he had his coronary  angioplasty.   He is admitted now for treatment and further workup.   PAST SURGICAL HISTORY:  1.  Appendectomy.  2.  Left femoral-popliteal bypass in January 2002 by Dr. Balinda Quails.  3.  Coronary angioplasty by Dr. Francisca December in approximately 1996.  4.  Rectal cyst removal in the past.   MEDICATIONS:  1.  Toprol XL 50 mg q.d.  2.  Aspirin 81 mg q.d.  3.  Red Rice Yeast.   ALLERGIES:  Cannot tolerate STATINS or ZETIA.   FAMILY HISTORY:  Positive for stroke in his mother. Father died suddenly in  his 78s. Mother also had bladder cancer.   REVIEW OF SYMPTOMS:  As per HPI. He had some right flank  pain last week. He  had some chest pressure lately like prior to angioplasty in the past. He has  had some left lower quadrant pain as per HPI. Denies any melena or bright  red blood per rectum.   PHYSICAL EXAMINATION:  GENERAL:  An alert male in no acute distress.  VITAL SIGNS:  Blood pressure 175/83, pulse 62 and regular, respiratory rate  16 and nonlabored, temperature 99.8. O2 saturation on room air is 97%.  HEENT:  Normocephalic and atraumatic. Oropharynx is moist.  NECK:  Supple without bruits, JVD, thyromegaly, lymphadenopathy.  CHEST:  Clear to auscultation.  HEART:  Regular rate and rhythm. No murmurs, gallops, or rubs. S1 and S2  normal.  ABDOMEN:  Guarding in all four quadrants but increased in the left lower  quadrant. No bruits. Does have rebound in the left lower quadrant. Bowel  sounds are decreased.  GENITOURINARY:  Benign and within normal limits.  EXTREMITIES:  Scar on the left thigh where he has had a  previous femoral-  popliteal bypass. He has good dorsalis pedis pulse on the left and faint one  on the right. Capillary refill is mildly sluggish on the right foot and  normal on the left foot.  NEUROLOGICAL:  Reveals there is no peripheral edema. Alert and oriented x 3.  Moves all extremities well.  SKIN:  The patient has a scaly rash of the right lower leg with increased  pigmentation.   LABORATORY DATA:  Chest x-ray shows no active disease. Abdominal and pelvic  CT shows the patient has extensive sclerotic vascular disease with findings  suggestive of renal artery stenosis with a very poorly opacified right renal  artery. There may be cyst in the kidney and the liver. CT scan of the pelvic  revealed extensive diverticular disease and stranding in the mesentery  around the sigmoid colon compatible with diverticulitis. No obvious abscess  or perforation. He is status post appendectomy. EKG reveals sinus rhythm at  64 with no ST segment elevation or depression.    White count is 15,400, hemoglobin 15.6, platelet count 172,000. He has 85%  neutrophils on differential. Sodium 135, potassium 5.0, chloride 105, BUN  18, creatinine 1.5, glucose 120. LFTs were normal. Amylase was 92, lipase of  26. CK is 118 and 129 respectively, two hours apart in the emergency room.  CK-MB is less than 1.0 on both checks and troponin I is less than 0.05 on  both checks in the ER. Urinalysis reveals specific gravity of 1.005 and is  within normal limits.   ASSESSMENT:  A 77 year old male with history of left lower quadrant pain and  a CT of the pelvis consistent with diverticulitis. Also has a possible right  renal artery stenosis and he has also developed recent chest heaviness and  recurrent palpitations which have occurred in the past when he had ischemic  heart disease that required coronary angioplasty.   PLAN:  1.  Diverticulitis:  Treat with intravenous Cipro, intravenous Flagyl, clear      liquid diet, and pain medicines with Phenergan and Dilaudid.  2.  Recent chest pressure:  Repeat cardiac workup to rule out ischemic heart      disease.  3.  Hypertension:  Follow closely. Refrain from ACE inhibitor or ARBs and      continue Toprol and aspirin for now. We will add Imdur 30 mg daily.  4.  History of tachycardia:  Needs to monitor.  5.  History of peripheral vascular disease, aware.  6.  The patient is a statin intolerant.      RNG/MEDQ  D:  06/07/2004  T:  06/08/2004  Job:  [id number]   cc:   Hal T. Stoneking, M.D.  [address] [place], [place] [address]  Fax: [phone]   P. Liliane Bade, M.D.  [address]  [place]  [place] [address]   Francisca December, M.D.  [address]  [place]  [place] [address]  Fax: [phone]

## 2010-07-16 NOTE — H&P (Signed)
[place]  Patient:    Samuel, Johnson                           MRN: [id number] Adm. Date:  03/29/00 Attending:  Denman Johnson, M.D. Dictator:   [name], P.A. CC:         [name], M.D.   History and Physical  DATE OF BIRTH:  1927-06-19  CHIEF COMPLAINT:  PVOD, left lower extremity claudication.  HISTORY OF PRESENT ILLNESS:  [name] is a pleasant 77 year old white male referred by Dr. Pete Johnson for evaluation of peripheral vascular disease/claudication.  For the last two years the patient has been experiencing lower extremity claudication which recently has been deteriorating.  The patient can not walk more than two blocks before the symptoms appear, especially if walking quickly or on an upward incline. Dopplers reveal a right AVI of 0.84 and a left AVI of 0.63.  Arteriogram showed significant stenosis at the left external iliac, femoral junction and left superficial femoral artery occlusion.  After discussion with the patient of options available he has decided to proceed with left fempop bypass graft along with left external iliac, femoral endarterectomy in order to relieve the symptoms scheduled for March 29, 2000.  Other than some hip and left thigh pain he denies any buttock pain, calf pain, or food pain.  No rest pain.  No night pain.  No slow healing ulcers or gangrenous changes.  No ischemic changes.  No peripheral edema or decrease in temperature.  No shortness of breath or dyspnea on exertion.  No chest pain.  Occasionally he experiences palpitations which are chronic secondary to his history of SVT.  PAST MEDICAL HISTORY: 1. PVOD/claudication. 2. Hypertension. 3. Hypercholesterolemia. 4. Previous history of SVT. 5. Osteoarthritis.  PAST SURGICAL HISTORY: 1. Status post PTCA by Dr. Amil Johnson 1996. 2. Status post abdominal angiogram with lower extremity runoff on January 2002 (no records available at the time  of the H&P).  MEDICATIONS: 1. Lipitor 40 mg p.o. q.d. 2. Monopril 10 mg p.o. q.d. 3. Toprol XL 25 mg p.o. q.d. 4. Aspirin 325 mg p.o. q.d. 5. ______ 50 mg p.o. q.d. 6. Vitamin E 400 IU p.o. q.d.  ALLERGIES:  No known drug allergies.  REVIEW OF SYSTEMS:  See HPI and past medical history for significant positives.  Otherwise, no diabetes, kidney disease, or asthma.  FAMILY HISTORY:  Other than mother dying of complications of bladder cancer age 31 is noncontributory.  SOCIAL HISTORY:  The patient is widowed.  He has two children.  He still works part-time as an Probation officer.  No tobacco or alcohol intake.  PHYSICAL EXAMINATION:  GENERAL:  Well-developed, well-nourished 77 year old white male in no acute distress.  Alert and oriented x 3 who looks much younger than his stated age.  VITAL SIGNS:  Blood pressure 140/80, pulse 64, respirations 18.  HEENT:  Normocephalic, atraumatic.  PERRLA.  EOMI.  Fundoscopic examination within normal limits.  NECK:  Supple.  No JVD, bruits, or lymphadenopathy.  CHEST:  Symmetrical on inspirations.  LUNGS:  Clear to auscultation bilaterally.  CARDIOVASCULAR:  Regular rate and rhythm with no rubs, murmurs, or gallops.  ABDOMEN:  Soft, nontender.  Bowel sounds x 4.  No masses or bruits.  GENITOURINARY:  Deferred.  RECTAL:  Deferred.  EXTREMITIES:  No cyanosis, clubbing, or edema.  No ulcerations.  Temperature warm.  Peripheral pulses:  Carotid 2+ bilaterally, femoral 1+  on the left/2+ on the right, popliteal, dorsalis pedis, and posterior tibialis nonpalpable on the left/2+ on the right.  NEUROLOGIC:  Nonfocal.  Steady gait.  DTRs 2+ bilaterally.  Muscle strength 5/5.  ASSESSMENT AND PLAN:  Left pulmonary vascular obstructive disease for left fempop bypass graft along with left external iliac, femoral endarterectomy scheduled on March 29, 2000 at [facility].  [name] has seen and evaluated this patient prior to the  admission and has explained the risks and benefits involving the procedure and the patient has agreed to continue. DD:  03/27/00 TD:  03/27/00 Job: [id number]

## 2010-07-16 NOTE — Consult Note (Signed)
[name], Johnson                  ACCOUNT NO.:  [id number]   MEDICAL RECORD NO.:  [id number]          PATIENT TYPE:  INP   LOCATION:  1406                         FACILITY:  [facility]   PHYSICIAN:  Petra Kuba, M.D.    DATE OF BIRTH:  02-Nov-1927   DATE OF CONSULTATION:  05/03/2006  DATE OF DISCHARGE:                                 CONSULTATION   HISTORY:  The patient seen at request of Dr. Rudean Curt for upper GI  bleeding.  He was recently diagnosed with PE, on Lovenox, Coumadin and  aspirin.  He had been on aspirin at home, has not had any upper tract GI  symptoms other than the above.  He also had a little bit of melena  today.  Dr. Laural Benes has done colonoscopy on him about a year two ago,  without any problems, although he did have two colon polyps, has had  flexible sigmoidoscopy by Dr. Pete Glatter in the past.  Currently, he is  without complaints except for gout.   PAST MEDICAL HISTORY:  Is pertinent for the gout, as well as a vascular  surgery by Dr. Madilyn Fireman and an appendectomy.  He has also had some heart  arrhythmias and increased cholesterol, hypertension.   FAMILY HISTORY:  Negative for any obvious GI problems.   MEDICINES AT HOME:  Include red yeast extracts, aspirin and Toprol.   ALLERGIES:  None.   SOCIAL HISTORY:  Does not drink, does not smoke.  Minimizes other over-  the-counter medicines.   CURRENT MEDICATIONS:  In the hospital, include the Coumadin, Lopressor  Ambien, colchicine, hydro morphine, Zofran, Tylenol and OxyContin.  His  Lovenox has been discontinued.   REVIEW OF SYSTEMS:  Negative except above.   PHYSICAL EXAM:  VITALS:  See chart.  No acute distress, lying  comfortably in the bed.  Sclera anicteric.  NECK:  Supple without  adenopathy.  LUNGS:  Clear.  Regular rate and rhythm.  ABDOMEN:  Soft,  nontender.   LABORATORY DATA:  INR is 2.4 with a hemoglobin of 12.6, normal platelet  count.  Other labs:  Please see computer.  BUN has been normal.   ASSESSMENT:  1. Multiple medical problems, currently with PE, on Coumadin, aspirin      and Lovenox.  2. Hematemesis.   PLAN:  The risks, benefits, methods of endoscopy were discussed.  We  will proceed ASAP.  We compared it to the colonoscopy he had with  further workup and plans pending those findings.           ______________________________  Petra Kuba, M.D.     MEM/MEDQ  D:  05/03/2006  T:  05/03/2006  Job:  [id number]   cc:   Hal T. Stoneking, M.D.  Fax: [phone]   Danise Edge, M.D.  Fax: [phone]   Andres Shad. Rudean Curt, MD

## 2010-07-16 NOTE — Consult Note (Signed)
NAMEGIANNO, VOLNER                  ACCOUNT NO.:  [id number]   MEDICAL RECORD NO.:  [id number]           PATIENT TYPE:   LOCATION:                                 FACILITY:   PHYSICIAN:  Theresia Majors. Tanda Rockers, M.D.DATE OF BIRTH:  06-29-1927   DATE OF CONSULTATION:  11/08/2004  DATE OF DISCHARGE:                                   CONSULTATION   REASON FOR CONSULTATION:  Mr. Ransford is a 77 year old man who has an  ulceration in his right lower extremity.  He is referred for management.   IMPRESSION:  Post-traumatic stasis ulcer.   RECOMMENDATIONS:  Unna boot protocol.   SUBJECTIVE:  Mr. 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  Eight weeks ago he bumped his  right lateral lower extremity on a piece of wood, and this resulted in an  avulsion of the skin.  He treated this with some topical antibiotics and  local care.  It became associated with intense itching and swelling but no  fever.  He was seen by the company physician and treated with topical  antibiotics, but the wound continued to expand.  He was seen by his primary  care physician, Dr. Pete Glatter, who referred him to the wound care and  hyperbaric center for management.   PAST MEDICAL HISTORY:  Remarkable for relatively good health.  He has no  specific disabilities.   He is a reformed smoker.   CURRENT MEDICATIONS:  An antihypertensive, Toprol and Monopril.  He is also  on a 15-day course of doxycycline.   His past surgeries included a femoral-popliteal bypass of the left leg.   SOCIAL HISTORY:  The patient lives locally.  He has children who live in the  area.   REVIEW OF SYSTEMS:  Specifically negative for angina pectoris.  His weight  has been stable.  He denies bowel or bladder changes.  He denies hemoptysis  or syncope.   PHYSICAL EXAMINATION:  GENERAL:  He is alert and oriented, in no acute  distress and in good contact with reality.  HEENT:  Clear.  NECK:   Supple.  Trachea is midline.  CARDIAC:  The heart sounds are normal.  The rhythm is regular.  ABDOMEN:  Soft without aneurysmal dilatation.  EXTREMITIES:  Femoral pulses are present in both groins.  He has a dorsalis  pedis pulse on the left, faint posterior tibial pulse on the right.  There  are marked and chronic changes of stasis in the right lateral lower  extremity with a chronic-appearing ulceration over the lateral aspect of the  right lower extremity.  This wound measures approximately 1.4 cm in  diameter.  It has scant drainage and is associated with a halo of erythema.  Capillary refill in this extremity is normal and the protective sensation is  preserved.  There is no associated adenopathy.   DISCUSSION:  This patient describes a work-related injury in the form of a  post-traumatic stasis ulcer. The presence of his engorged varicose veins in  the right  leg suggest that significant venous hypertension has complicated  his primary healing.  We have explained the progression of management, which  is to include external compression wraps per the Unna boot protocol with a  transition to a 30-40 mm below the knee garment in the future.  The patient  and his daughter seem to understand the explanation, and they expressed an  attitude of compliance.  We will see him in one week.           ______________________________  Theresia Majors. Tanda Rockers, M.D.     Cephus Slater  D:  11/08/2004  T:  11/09/2004  Job:  [id number]   cc:   Hal T. Stoneking, M.D.  Fax: [phone]

## 2010-07-20 ENCOUNTER — Ambulatory Visit (INDEPENDENT_AMBULATORY_CARE_PROVIDER_SITE_OTHER): Payer: Medicare Other | Admitting: Vascular Surgery

## 2010-07-20 ENCOUNTER — Encounter (INDEPENDENT_AMBULATORY_CARE_PROVIDER_SITE_OTHER): Payer: Medicare Other

## 2010-07-20 DIAGNOSIS — I739 Peripheral vascular disease, unspecified: Secondary | ICD-10-CM

## 2010-07-20 DIAGNOSIS — Z48812 Encounter for surgical aftercare following surgery on the circulatory system: Secondary | ICD-10-CM

## 2010-07-20 DIAGNOSIS — I70219 Atherosclerosis of native arteries of extremities with intermittent claudication, unspecified extremity: Secondary | ICD-10-CM

## 2010-07-20 NOTE — Assessment & Plan Note (Signed)
OFFICE VISIT  RYO, KLANG DOB:  1927/08/09                                       07/20/2010 EAVWU#:[id number]  Patient presents today for continued follow-up of his lower extremity arterial insufficiency.  He is an 77 year old gentleman who had undergone a prosthetic graft with Gore-Tex, and a left fem above-knee popliteal bypass in 2002.  He remains quite active at the age of 39.  He had been seen in our office 1 year ago with a patent bypass.  He does report bilateral lower extremity calf claudication symptoms similar right and left.  He does not recall any sudden worsening in this.  He does remain quite active and cares for himself.  He is hypertensive with elevated cholesterol.  Did have a history of pulmonary embolus from a DVT in 2008.  His occupation is Public affairs consultant.  He is widowed with 2 children. Does not smoke or drink alcohol.  Review of systems is otherwise unchanged from prior visit.  PHYSICAL EXAMINATION:  A well-developed, well-nourished white male appearing younger than stated age of age of 36.  Blood pressure today is 195/89, pulse 50, respirations 16.  HEENT:  Normal.  His femoral pulses are 2+ bilaterally.  He has 2+ radial pulses bilaterally.  I do not palpate popliteal or distal pulses bilaterally.  He underwent noninvasive vascular laboratory studies in our office, and it does show somewhat of a diminished flow on both sides.  On the right- side, his ankle arm index was 0.74 a year ago and is 0.61 today.  I have more concern that his ankle-arm index  was 0.96 on the left and today is 0.57.  On duplex imaging, he has occluded his fem-pop bypass.  I discussed this at length with patient.  I explained that he has not had any significant symptoms from claudication and therefore would not recommend any further evaluation or treatment.  He is able to tolerate this level of ischemia with apparently good collateral formation.   We will see him again in 1 year with repeat ankle-arm index, and he knows to notify us should he develop any tissue loss or worsening discomfort.    Larina Earthly, M.D. Electronically Signed  TFE/MEDQ  D:  07/20/2010  T:  07/20/2010  Job:  [id number]  cc:   Hal T. Stoneking, M.D.

## 2010-07-28 NOTE — Procedures (Unsigned)
BYPASS GRAFT EVALUATION  INDICATION:  Follow up left lower extremity bypass graft.  HISTORY: Diabetes:  No. Cardiac:  Arrhythmia. Hypertension:  Yes. Smoking:  No. Previous Surgery:  Left femoral-to-popliteal bypass graft by Dr. Madilyn Fireman, 03/02/2000.  SINGLE LEVEL ARTERIAL EXAM                              RIGHT              LEFT Brachial:                    178                167 Anterior tibial:             86                 91 Posterior tibial:            108                101 Peroneal: Ankle/brachial index:        0.61               0.57  PREVIOUS ABI:  Date: 07/22/2009  RIGHT:  0.74  LEFT:  0.96  LOWER EXTREMITY BYPASS GRAFT DUPLEX EXAM:  DUPLEX:  No flow visualized within the left femoral-to-popliteal bypass graft.  IMPRESSION: 1. Declined ankle brachial indices bilaterally. 2. Occluded left femoral-to-popliteal bypass graft.  ___________________________________________ Larina Earthly, M.D.  EM/MEDQ  D:  07/21/2010  T:  07/21/2010  Job:  [id number]

## 2011-07-18 ENCOUNTER — Telehealth: Payer: Self-pay | Admitting: Surgery

## 2011-07-18 NOTE — Telephone Encounter (Signed)
Message copied by Fredrich Birks on Mon Jul 18, 2011 12:01 PM ------      Message from: Marcellus Scott      Created: Mon Jul 18, 2011 11:36 AM      Contact: [phone]       [name] DOB [birth year] called to reschedule his appointment.            Thanks      Revonda Standard

## 2011-07-18 NOTE — Telephone Encounter (Signed)
Spoke with pt to r/s to 06/12 @ 100pm, dpm

## 2011-08-10 ENCOUNTER — Ambulatory Visit (INDEPENDENT_AMBULATORY_CARE_PROVIDER_SITE_OTHER): Payer: Medicare Other | Admitting: Vascular Surgery

## 2011-08-10 DIAGNOSIS — T82898A Other specified complication of vascular prosthetic devices, implants and grafts, initial encounter: Secondary | ICD-10-CM

## 2011-08-10 DIAGNOSIS — I739 Peripheral vascular disease, unspecified: Secondary | ICD-10-CM

## 2011-08-10 DIAGNOSIS — Z48812 Encounter for surgical aftercare following surgery on the circulatory system: Secondary | ICD-10-CM

## 2011-08-18 ENCOUNTER — Other Ambulatory Visit: Payer: Self-pay | Admitting: *Deleted

## 2011-08-18 DIAGNOSIS — I739 Peripheral vascular disease, unspecified: Secondary | ICD-10-CM

## 2011-08-18 DIAGNOSIS — Z48812 Encounter for surgical aftercare following surgery on the circulatory system: Secondary | ICD-10-CM

## 2013-05-09 ENCOUNTER — Other Ambulatory Visit (HOSPITAL_COMMUNITY): Payer: Self-pay

## 2013-05-21 ENCOUNTER — Other Ambulatory Visit: Payer: Self-pay | Admitting: *Deleted

## 2013-05-21 DIAGNOSIS — I739 Peripheral vascular disease, unspecified: Secondary | ICD-10-CM

## 2013-06-03 ENCOUNTER — Encounter: Payer: Self-pay | Admitting: Vascular Surgery

## 2013-06-04 ENCOUNTER — Other Ambulatory Visit: Payer: Self-pay | Admitting: Vascular Surgery

## 2013-06-04 ENCOUNTER — Encounter: Payer: Self-pay | Admitting: Vascular Surgery

## 2013-06-04 ENCOUNTER — Ambulatory Visit (INDEPENDENT_AMBULATORY_CARE_PROVIDER_SITE_OTHER): Payer: Medicare Other | Admitting: Vascular Surgery

## 2013-06-04 ENCOUNTER — Ambulatory Visit (HOSPITAL_COMMUNITY)
Admission: RE | Admit: 2013-06-04 | Discharge: 2013-06-04 | Disposition: A | Discharge status: Home / Self Care | Payer: Medicare Other | Source: Ambulatory Visit | Attending: Vascular Surgery | Admitting: Vascular Surgery

## 2013-06-04 ENCOUNTER — Ambulatory Visit (INDEPENDENT_AMBULATORY_CARE_PROVIDER_SITE_OTHER)
Admission: RE | Admit: 2013-06-04 | Discharge: 2013-06-04 | Disposition: A | Discharge status: Home / Self Care | Payer: Medicare Other | Source: Ambulatory Visit | Attending: Vascular Surgery | Admitting: Vascular Surgery

## 2013-06-04 VITALS — BP 217/90 | HR 61 | Resp 18 | Ht 68.0 in | Wt 182.2 lb

## 2013-06-04 DIAGNOSIS — I739 Peripheral vascular disease, unspecified: Secondary | ICD-10-CM

## 2013-06-04 NOTE — Progress Notes (Signed)
The patient presents today for continued followup of his known lower extremity arterial insufficiency. He is status post left femoral to popliteal bypass in 2002 with Dr. Amedeo Plenty. He has known graft occlusion since at least 2012. He reports stable claudication. He is more claudication his right calf in his left. He reports this is mildly limiting to him and he is able to tolerate it. He has no rest pain and tissue loss. In reviewing his prior noninvasive studies he had evidence of right superficial artery occlusion dating back at least 2005. He remained stable from a cardiac standpoint  Past Medical History  Diagnosis Date  . CAD (coronary artery disease)   . SVT (supraventricular tachycardia)   . Peripheral vascular disease   . Hyperlipidemia   . Hypertension   . Diverticulosis   . Vitamin B12 deficiency   . Hyperglycemia   . Chronic kidney disease   . Gout   . Mallory-Weiss tear   . Adenomatous polyp   . DVT (deep venous thrombosis)   . History of pulmonary embolus (PE)     from a DVT in 2008    History  Substance Use Topics  . Smoking status: Never Smoker   . Smokeless tobacco: Not on file  . Alcohol Use: No    Family History  Problem Relation Age of Onset  . Cancer Mother     bladder  . CAD Father   . Heart attack Father     Allergies  Allergen Reactions  . Lipitor [Atorvastatin]     Muscle pain   . Zetia [Ezetimibe] Hives    Current outpatient prescriptions:allopurinol (ZYLOPRIM) 300 MG tablet, Take 300 mg by mouth daily., Disp: , Rfl: ;  aspirin 81 MG tablet, Take 81 mg by mouth daily., Disp: , Rfl: ;  cyanocobalamin (,VITAMIN B-12,) 1000 MCG/ML injection, Inject 1,000 mcg into the muscle every 30 (thirty) days., Disp: , Rfl: ;  methocarbamol (ROBAXIN) 500 MG tablet, Take 500 mg by mouth 3 (three) times daily., Disp: , Rfl:   BP 217/90  Pulse 61  Resp 18  Ht 5\' 8"  (1.727 m)  Wt 182 lb 3.2 oz (82.645 kg)  BMI 27.71 kg/m2  Body mass index is 27.71  kg/(m^2).       Physical exam well-developed well-nourished woman appearing younger than stated age 80. Neurologically grossly intact Pulse status 2+ radial and femoral pulses bilaterally. Absent popliteal and distal pulses bilaterally Skin as noted have some changes of venous hypertension and is right calf and ankle. No ulcerations and no evidence of arterial ulcers  Noninvasive vascular studies reveal ankle arm index of 0.62 on the right and 0.65 on the left. Duplex imaging reveals a clip known occlusion of his femoropopliteal graft in the left. He does have occlusion of the superficial femoral artery on the right  Impression and plan: Stable moderate lower extremity arterial insufficiency bilaterally. Again discussed need for him to notify us immediately should he develop any tissue loss or rest pain. He will continue his walking program. Discuss this at length with the patient and his daughter present. We will see him again on an as-needed basis

## 2013-10-21 ENCOUNTER — Other Ambulatory Visit: Payer: Self-pay | Admitting: Geriatric Medicine

## 2013-10-21 DIAGNOSIS — R748 Abnormal levels of other serum enzymes: Secondary | ICD-10-CM

## 2013-11-15 ENCOUNTER — Ambulatory Visit
Admission: RE | Admit: 2013-11-15 | Discharge: 2013-11-15 | Disposition: A | Discharge status: Home / Self Care | Payer: Medicare Other | Source: Ambulatory Visit | Attending: Geriatric Medicine | Admitting: Geriatric Medicine

## 2013-11-15 DIAGNOSIS — R748 Abnormal levels of other serum enzymes: Secondary | ICD-10-CM

## 2015-09-29 DIAGNOSIS — I639 Cerebral infarction, unspecified: Secondary | ICD-10-CM

## 2015-09-29 HISTORY — DX: Cerebral infarction, unspecified: I63.9

## 2015-10-14 ENCOUNTER — Emergency Department (HOSPITAL_COMMUNITY): Payer: Medicare Other

## 2015-10-14 ENCOUNTER — Observation Stay (HOSPITAL_COMMUNITY): Payer: Medicare Other

## 2015-10-14 ENCOUNTER — Inpatient Hospital Stay (HOSPITAL_COMMUNITY)
Admission: EM | Admit: 2015-10-14 | Discharge: 2015-10-21 | DRG: 065 | Disposition: A | Discharge status: Rehabilitation Facility | Payer: Medicare Other | Attending: Family Medicine | Admitting: Family Medicine

## 2015-10-14 ENCOUNTER — Encounter (HOSPITAL_COMMUNITY): Payer: Self-pay | Admitting: Emergency Medicine

## 2015-10-14 DIAGNOSIS — E1151 Type 2 diabetes mellitus with diabetic peripheral angiopathy without gangrene: Secondary | ICD-10-CM | POA: Diagnosis present

## 2015-10-14 DIAGNOSIS — I951 Orthostatic hypotension: Secondary | ICD-10-CM | POA: Diagnosis not present

## 2015-10-14 DIAGNOSIS — E1122 Type 2 diabetes mellitus with diabetic chronic kidney disease: Secondary | ICD-10-CM | POA: Diagnosis present

## 2015-10-14 DIAGNOSIS — R29810 Facial weakness: Secondary | ICD-10-CM | POA: Diagnosis present

## 2015-10-14 DIAGNOSIS — I651 Occlusion and stenosis of basilar artery: Secondary | ICD-10-CM | POA: Diagnosis present

## 2015-10-14 DIAGNOSIS — E663 Overweight: Secondary | ICD-10-CM | POA: Diagnosis present

## 2015-10-14 DIAGNOSIS — I6302 Cerebral infarction due to thrombosis of basilar artery: Secondary | ICD-10-CM | POA: Diagnosis present

## 2015-10-14 DIAGNOSIS — Z8249 Family history of ischemic heart disease and other diseases of the circulatory system: Secondary | ICD-10-CM

## 2015-10-14 DIAGNOSIS — I69322 Dysarthria following cerebral infarction: Secondary | ICD-10-CM

## 2015-10-14 DIAGNOSIS — I739 Peripheral vascular disease, unspecified: Secondary | ICD-10-CM | POA: Diagnosis present

## 2015-10-14 DIAGNOSIS — Z79899 Other long term (current) drug therapy: Secondary | ICD-10-CM

## 2015-10-14 DIAGNOSIS — I455 Other specified heart block: Secondary | ICD-10-CM | POA: Diagnosis not present

## 2015-10-14 DIAGNOSIS — I10 Essential (primary) hypertension: Secondary | ICD-10-CM

## 2015-10-14 DIAGNOSIS — R001 Bradycardia, unspecified: Secondary | ICD-10-CM | POA: Diagnosis not present

## 2015-10-14 DIAGNOSIS — R202 Paresthesia of skin: Secondary | ICD-10-CM

## 2015-10-14 DIAGNOSIS — R200 Anesthesia of skin: Secondary | ICD-10-CM

## 2015-10-14 DIAGNOSIS — I2510 Atherosclerotic heart disease of native coronary artery without angina pectoris: Secondary | ICD-10-CM | POA: Diagnosis present

## 2015-10-14 DIAGNOSIS — E1159 Type 2 diabetes mellitus with other circulatory complications: Secondary | ICD-10-CM | POA: Diagnosis not present

## 2015-10-14 DIAGNOSIS — I639 Cerebral infarction, unspecified: Principal | ICD-10-CM | POA: Diagnosis present

## 2015-10-14 DIAGNOSIS — Z9861 Coronary angioplasty status: Secondary | ICD-10-CM

## 2015-10-14 DIAGNOSIS — G8191 Hemiplegia, unspecified affecting right dominant side: Secondary | ICD-10-CM

## 2015-10-14 DIAGNOSIS — I161 Hypertensive emergency: Secondary | ICD-10-CM | POA: Diagnosis present

## 2015-10-14 DIAGNOSIS — I69391 Dysphagia following cerebral infarction: Secondary | ICD-10-CM

## 2015-10-14 DIAGNOSIS — N189 Chronic kidney disease, unspecified: Secondary | ICD-10-CM

## 2015-10-14 DIAGNOSIS — N183 Chronic kidney disease, stage 3 unspecified: Secondary | ICD-10-CM

## 2015-10-14 DIAGNOSIS — M109 Gout, unspecified: Secondary | ICD-10-CM | POA: Diagnosis present

## 2015-10-14 DIAGNOSIS — Z7982 Long term (current) use of aspirin: Secondary | ICD-10-CM

## 2015-10-14 DIAGNOSIS — Z86711 Personal history of pulmonary embolism: Secondary | ICD-10-CM

## 2015-10-14 DIAGNOSIS — R1310 Dysphagia, unspecified: Secondary | ICD-10-CM

## 2015-10-14 DIAGNOSIS — E119 Type 2 diabetes mellitus without complications: Secondary | ICD-10-CM

## 2015-10-14 DIAGNOSIS — R471 Dysarthria and anarthria: Secondary | ICD-10-CM | POA: Diagnosis present

## 2015-10-14 DIAGNOSIS — M10041 Idiopathic gout, right hand: Secondary | ICD-10-CM

## 2015-10-14 DIAGNOSIS — R4781 Slurred speech: Secondary | ICD-10-CM | POA: Diagnosis not present

## 2015-10-14 DIAGNOSIS — Z6827 Body mass index (BMI) 27.0-27.9, adult: Secondary | ICD-10-CM

## 2015-10-14 DIAGNOSIS — R4701 Aphasia: Secondary | ICD-10-CM | POA: Diagnosis present

## 2015-10-14 DIAGNOSIS — Z86718 Personal history of other venous thrombosis and embolism: Secondary | ICD-10-CM

## 2015-10-14 DIAGNOSIS — E785 Hyperlipidemia, unspecified: Secondary | ICD-10-CM

## 2015-10-14 DIAGNOSIS — I129 Hypertensive chronic kidney disease with stage 1 through stage 4 chronic kidney disease, or unspecified chronic kidney disease: Secondary | ICD-10-CM | POA: Diagnosis present

## 2015-10-14 LAB — I-STAT CHEM 8, ED
BUN: 27 mg/dL — ABNORMAL HIGH (ref 6–20)
Calcium, Ion: 1.22 mmol/L (ref 1.12–1.23)
Chloride: 102 mmol/L (ref 101–111)
Creatinine, Ser: 1.5 mg/dL — ABNORMAL HIGH (ref 0.61–1.24)
Glucose, Bld: 124 mg/dL — ABNORMAL HIGH (ref 65–99)
HEMATOCRIT: 52 % (ref 39.0–52.0)
HEMOGLOBIN: 17.7 g/dL — AB (ref 13.0–17.0)
POTASSIUM: 4.6 mmol/L (ref 3.5–5.1)
Sodium: 140 mmol/L (ref 135–145)
TCO2: 26 mmol/L (ref 0–100)

## 2015-10-14 LAB — DIFFERENTIAL
BASOS ABS: 0 10*3/uL (ref 0.0–0.1)
BASOS PCT: 0 %
EOS ABS: 0.2 10*3/uL (ref 0.0–0.7)
Eosinophils Relative: 2 %
Lymphocytes Relative: 24 %
Lymphs Abs: 2 10*3/uL (ref 0.7–4.0)
Monocytes Absolute: 0.6 10*3/uL (ref 0.1–1.0)
Monocytes Relative: 7 %
NEUTROS PCT: 67 %
Neutro Abs: 5.8 10*3/uL (ref 1.7–7.7)

## 2015-10-14 LAB — URINALYSIS, ROUTINE W REFLEX MICROSCOPIC
BILIRUBIN URINE: NEGATIVE
Glucose, UA: NEGATIVE mg/dL
HGB URINE DIPSTICK: NEGATIVE
KETONES UR: NEGATIVE mg/dL
Leukocytes, UA: NEGATIVE
Nitrite: NEGATIVE
PROTEIN: NEGATIVE mg/dL
Specific Gravity, Urine: 1.01 (ref 1.005–1.030)
pH: 6.5 (ref 5.0–8.0)

## 2015-10-14 LAB — COMPREHENSIVE METABOLIC PANEL
ALBUMIN: 4.8 g/dL (ref 3.5–5.0)
ALT: 103 U/L — ABNORMAL HIGH (ref 17–63)
AST: 71 U/L — AB (ref 15–41)
Alkaline Phosphatase: 53 U/L (ref 38–126)
Anion gap: 7 (ref 5–15)
BUN: 25 mg/dL — AB (ref 6–20)
CHLORIDE: 104 mmol/L (ref 101–111)
CO2: 27 mmol/L (ref 22–32)
Calcium: 9.8 mg/dL (ref 8.9–10.3)
Creatinine, Ser: 1.45 mg/dL — ABNORMAL HIGH (ref 0.61–1.24)
GFR calc Af Amer: 49 mL/min — ABNORMAL LOW (ref >60)
GFR, EST NON AFRICAN AMERICAN: 42 mL/min — AB (ref >60)
Glucose, Bld: 127 mg/dL — ABNORMAL HIGH (ref 65–99)
POTASSIUM: 4.5 mmol/L (ref 3.5–5.1)
Sodium: 138 mmol/L (ref 135–145)
Total Bilirubin: 0.7 mg/dL (ref 0.3–1.2)
Total Protein: 8.4 g/dL — ABNORMAL HIGH (ref 6.5–8.1)

## 2015-10-14 LAB — RAPID URINE DRUG SCREEN, HOSP PERFORMED
AMPHETAMINES: NOT DETECTED
BARBITURATES: NOT DETECTED
BENZODIAZEPINES: NOT DETECTED
Cocaine: NOT DETECTED
Opiates: NOT DETECTED
TETRAHYDROCANNABINOL: NOT DETECTED

## 2015-10-14 LAB — I-STAT TROPONIN, ED: TROPONIN I, POC: 0.01 ng/mL (ref 0.00–0.08)

## 2015-10-14 LAB — CBC
HCT: 50.3 % (ref 39.0–52.0)
Hemoglobin: 17.1 g/dL — ABNORMAL HIGH (ref 13.0–17.0)
MCH: 30.3 pg (ref 26.0–34.0)
MCHC: 34 g/dL (ref 30.0–36.0)
MCV: 89 fL (ref 78.0–100.0)
Platelets: 187 10*3/uL (ref 150–400)
RBC: 5.65 MIL/uL (ref 4.22–5.81)
RDW: 13.8 % (ref 11.5–15.5)
WBC: 8.6 10*3/uL (ref 4.0–10.5)

## 2015-10-14 LAB — PROTIME-INR
INR: 1.01
PROTHROMBIN TIME: 13.3 s (ref 11.4–15.2)

## 2015-10-14 LAB — APTT: APTT: 31 s (ref 24–36)

## 2015-10-14 LAB — ETHANOL

## 2015-10-14 LAB — CBG MONITORING, ED: Glucose-Capillary: 112 mg/dL — ABNORMAL HIGH (ref 65–99)

## 2015-10-14 MED ORDER — STROKE: EARLY STAGES OF RECOVERY BOOK
Freq: Once | Status: AC
  Administered 2015-10-14: 23:00:00
  Filled 2015-10-14 (×2): qty 1

## 2015-10-14 MED ORDER — LOSARTAN POTASSIUM 50 MG PO TABS
50.0000 mg | ORAL_TABLET | Freq: Every day | ORAL | Status: DC
  Administered 2015-10-15 – 2015-10-16 (×2): 50 mg via ORAL
  Filled 2015-10-14 (×2): qty 1

## 2015-10-14 MED ORDER — HYDRALAZINE HCL 20 MG/ML IJ SOLN
5.0000 mg | Freq: Four times a day (QID) | INTRAMUSCULAR | Status: DC | PRN
  Administered 2015-10-15 – 2015-10-16 (×2): 5 mg via INTRAVENOUS
  Filled 2015-10-14 (×2): qty 1

## 2015-10-14 MED ORDER — SENNOSIDES-DOCUSATE SODIUM 8.6-50 MG PO TABS: 1.0000 | ORAL_TABLET | Freq: Every evening | ORAL | Status: DC | PRN

## 2015-10-14 MED ORDER — METOPROLOL TARTRATE 25 MG PO TABS
25.0000 mg | ORAL_TABLET | Freq: Two times a day (BID) | ORAL | Status: DC
  Administered 2015-10-15 – 2015-10-20 (×11): 25 mg via ORAL
  Filled 2015-10-14 (×11): qty 1

## 2015-10-14 MED ORDER — ASPIRIN 300 MG RE SUPP
300.0000 mg | Freq: Every day | RECTAL | Status: DC
  Administered 2015-10-14: 300 mg via RECTAL
  Filled 2015-10-14: qty 1

## 2015-10-14 MED ORDER — ASPIRIN 325 MG PO TABS
325.0000 mg | ORAL_TABLET | Freq: Every day | ORAL | Status: DC
  Administered 2015-10-15 – 2015-10-21 (×7): 325 mg via ORAL
  Filled 2015-10-14 (×7): qty 1

## 2015-10-14 MED ORDER — INSULIN ASPART 100 UNIT/ML ~~LOC~~ SOLN
0.0000 [IU] | SUBCUTANEOUS | Status: DC
  Administered 2015-10-15 (×2): 2 [IU] via SUBCUTANEOUS
  Administered 2015-10-15 – 2015-10-17 (×6): 1 [IU] via SUBCUTANEOUS
  Administered 2015-10-17: 2 [IU] via SUBCUTANEOUS
  Administered 2015-10-18: 1 [IU] via SUBCUTANEOUS
  Administered 2015-10-18: 2 [IU] via SUBCUTANEOUS
  Administered 2015-10-18 (×2): 1 [IU] via SUBCUTANEOUS
  Administered 2015-10-19 (×2): 2 [IU] via SUBCUTANEOUS
  Administered 2015-10-19 – 2015-10-20 (×5): 1 [IU] via SUBCUTANEOUS
  Administered 2015-10-20: 2 [IU] via SUBCUTANEOUS
  Administered 2015-10-20: 1 [IU] via SUBCUTANEOUS
  Administered 2015-10-21: 3 [IU] via SUBCUTANEOUS

## 2015-10-14 MED ORDER — HEPARIN SODIUM (PORCINE) 5000 UNIT/ML IJ SOLN
5000.0000 [IU] | Freq: Three times a day (TID) | INTRAMUSCULAR | Status: DC
  Administered 2015-10-14 – 2015-10-21 (×21): 5000 [IU] via SUBCUTANEOUS
  Filled 2015-10-14 (×21): qty 1

## 2015-10-14 NOTE — ED Notes (Signed)
MD at bedside. 

## 2015-10-14 NOTE — ED Notes (Addendum)
Took PT to restroom. PT unable to give urine sample at this time.

## 2015-10-14 NOTE — ED Provider Notes (Addendum)
[facility] DEPT Provider Note   CSN: [id number] Arrival date & time: 10/14/15  1219     History   Chief Complaint Chief Complaint  Patient presents with  . trouble with speech  . Numbness  . Fatigue    HPI [name] is a 82 y.o. male.  He presents for evaluation of "trouble getting words out" for 3 days. His daughter noticed the same yesterday, and today was concerned that he was worse today, because his lips were blue, so took him to his PCP. His PCP noticed the trouble getting words out, and felt that he was favoring his right leg when walking, so sent him here for evaluation. He also has a painful sensation behind the left ear, that comes and goes. He came by private vehicle. He was able to ambulate. No similar problem in the past. He lives alone. There are no other known modifying factors.    HPI  Past Medical History:  Diagnosis Date  . Adenomatous polyp   . CAD (coronary artery disease)   . Chronic kidney disease   . Diverticulosis   . DVT (deep venous thrombosis) ([place])   . Gout   . History of pulmonary embolus (PE)    from a DVT in 2008  . Hyperglycemia   . Hyperlipidemia   . Hypertension   . Mallory-Weiss tear   . Peripheral vascular disease ([place])   . SVT (supraventricular tachycardia) ([place])   . Vitamin B12 deficiency     Patient Active Problem List   Diagnosis Date Noted  . CVA (cerebral vascular accident) ([place]) 10/14/2015  . Peripheral vascular disease, unspecified ([place]) 06/04/2013    Past Surgical History:  Procedure Laterality Date  .  PTCA of coronary lesion Right 1995  . APPENDECTOMY    . PR VEIN BYPASS GRAFT,AORTO-FEM-POP Left    femoral-popliteal BP by Dr. Drucie Opitz  . repair of aal fissure   1978       Home Medications    Prior to Admission medications   Medication Sig Start Date End Date Taking? Authorizing Provider  amoxicillin (AMOXIL) 500 MG capsule Take 500 mg by mouth every 6 (six) hours.  10/13/15  Yes Historical  Provider, MD  aspirin 81 MG tablet Take 81 mg by mouth daily.   Yes Historical Provider, MD  losartan (COZAAR) 50 MG tablet Take 50 mg by mouth daily. 09/14/15  Yes Historical Provider, MD  metoprolol tartrate (LOPRESSOR) 25 MG tablet Take 25 mg by mouth 2 (two) times daily. 08/03/15  Yes Historical Provider, MD    Family History Family History  Problem Relation Age of Onset  . Cancer Mother     bladder  . CAD Father   . Heart attack Father     Social History Social History  Substance Use Topics  . Smoking status: Never Smoker  . Smokeless tobacco: Never Used  . Alcohol use No     Allergies   Lipitor [atorvastatin] and Zetia [ezetimibe]   Review of Systems Review of Systems  All other systems reviewed and are negative.    Physical Exam Updated Vital Signs BP (!) 222/89   Pulse 60   Temp 97.7 F (36.5 C)   Resp 11   Ht 5\' 7"  (1.702 m)   Wt 177 lb 3.2 oz (80.4 kg)   SpO2 100%   BMI 27.75 kg/m   Physical Exam  Constitutional: He is oriented to person, place, and time. He appears well-developed and well-nourished.  HENT:  Head: Normocephalic  and atraumatic.  Right Ear: External ear normal.  Left Ear: External ear normal.  Eyes: Conjunctivae and EOM are normal. Pupils are equal, round, and reactive to light.  Neck: Normal range of motion and phonation normal. Neck supple.  Cardiovascular: Normal rate, regular rhythm and normal heart sounds.   Pulmonary/Chest: Effort normal and breath sounds normal. He exhibits no bony tenderness.  Abdominal: Soft. There is no tenderness.  Musculoskeletal: Normal range of motion.  Neurological: He is alert and oriented to person, place, and time. No cranial nerve deficit or sensory deficit. He exhibits normal muscle tone (.ewed). Coordination normal.  No dysarthria or nystagmus. Mild expressive aphasia. Normal finger-to-nose and heel-to-shin, bilaterally.  Skin: Skin is warm, dry and intact.  Psychiatric: He has a normal mood and  affect. His behavior is normal. Judgment and thought content normal.  Nursing note and vitals reviewed.    ED Treatments / Results  Labs (all labs ordered are listed, but only abnormal results are displayed) Labs Reviewed  CBC - Abnormal; Notable for the following:       Result Value   Hemoglobin 17.1 (*)    All other components within normal limits  COMPREHENSIVE METABOLIC PANEL - Abnormal; Notable for the following:    Glucose, Bld 127 (*)    BUN 25 (*)    Creatinine, Ser 1.45 (*)    Total Protein 8.4 (*)    AST 71 (*)    ALT 103 (*)    GFR calc non Af Amer 42 (*)    GFR calc Af Amer 49 (*)    All other components within normal limits  I-STAT CHEM 8, ED - Abnormal; Notable for the following:    BUN 27 (*)    Creatinine, Ser 1.50 (*)    Glucose, Bld 124 (*)    Hemoglobin 17.7 (*)    All other components within normal limits  ETHANOL  PROTIME-INR  APTT  DIFFERENTIAL  URINE RAPID DRUG SCREEN, HOSP PERFORMED  URINALYSIS, ROUTINE W REFLEX MICROSCOPIC (NOT AT [facility])  I-STAT TROPOININ, ED    EKG  EKG Interpretation  Date/Time:  Wednesday October 14 2015 12:42:34 EDT Ventricular Rate:  56 PR Interval:    QRS Duration: 128 QT Interval:  431 QTC Calculation: 416 R Axis:   -45 Text Interpretation:  Sinus rhythm Prolonged PR interval Left bundle branch block Since last tracing rate slower  and LBBB is new Confirmed by Eulis Foster  MD, Floye Fesler [id number]) on 10/14/2015 5:31:40 PM       Radiology Mr Brain Wo Contrast  Result Date: 10/14/2015 CLINICAL DATA:  Expressive aphasia. EXAM: MRI HEAD WITHOUT CONTRAST TECHNIQUE: Multiplanar, multiecho pulse sequences of the brain and surrounding structures were obtained without intravenous contrast. COMPARISON:  None. FINDINGS: Acute infarct left pons, moderate in size.  No other acute infarct Mild chronic microvascular ischemic changes in the white matter. Moderate atrophy. Negative for mass or edema.  No shift of the midline structures  Pituitary normal in size. Normal orbital contents. Bilateral cataract extraction. Mucosal edema right maxillary sinus.  Remaining sinuses clear. IMPRESSION: Acute infarct in the left pons. Generalized atrophy with mild chronic microvascular ischemia in the white matter. Electronically Signed   By: Franchot Gallo M.D.   On: 10/14/2015 15:02    Procedures Procedures (including critical care time)  Medications Ordered in ED Medications - No data to display   Initial Impression / Assessment and Plan / ED Course  I have reviewed the triage vital signs and the nursing  notes.  Pertinent labs & imaging results that were available during my care of the patient were reviewed by me and considered in my medical decision making (see chart for details).  Clinical Course   Medications - No data to display  Patient Vitals for the past 24 hrs:  BP Temp Temp src Pulse Resp SpO2 Height Weight  10/14/15 1700 (!) 222/89 - - 60 11 100 % - -  10/14/15 1630 (!) 216/87 - - (!) 55 11 98 % - -  10/14/15 1530 198/90 - - (!) 58 15 97 % - -  10/14/15 1513 (!) 217/91 - - (!) 59 16 99 % - -  10/14/15 1330 (!) 211/74 - - (!) 55 11 98 % - -  10/14/15 1309 (!) 213/90 - - (!) 59 12 96 % - -  10/14/15 1305 - 97.7 F (36.5 C) - - - - - -  10/14/15 1241 (!) 204/84 - - (!) 58 18 100 % - -  10/14/15 1233 - - - - - - 5\' 7"  (1.702 m) 177 lb 3.2 oz (80.4 kg)  10/14/15 1232 (!) 225/102 97.7 F (36.5 C) Oral (!) 57 18 100 % - -    5:00 PM Reevaluation with update and discussion. After initial assessment and treatment, an updated evaluation reveals No change in clinical status, he remains comfortable. Patient and family member updated on findings. Shailey Butterbaugh L   5:01 PM-Consult complete with hospitalist. Patient case explained and discussed. He agrees to admit patient for further evaluation and treatment. He asked that I call the neural hospitalist. Call ended at 17:10  17:12- page to neuro hospitalist- Dr. Leonel Ramsay  will see the patient when he gets to [facility]- 17:14   Final Clinical Impressions(s) / ED Diagnoses   Final diagnoses:  Cerebral infarction due to unspecified mechanism    Acute CVA with aphasia. Patient was not a candidate for thrombolysis, because of duration of symptoms. He will require further evaluation in the hospital setting with consultation and observation.  Nursing Notes Reviewed/ Care Coordinated, and agree without changes. Applicable Imaging Reviewed.  Interpretation of Laboratory Data incorporated into ED treatment   Plan: Admit  New Prescriptions New Prescriptions   No medications on file     Daleen Bo, MD 10/14/15 [place], MD 10/14/15 [phone]

## 2015-10-14 NOTE — H&P (Signed)
[facility] History and Physical   Patient: [name] [id number]   PCP: Mathews Argyle, MD DOB: 23-Jun-1927   DOA: 10/14/2015   DOS: 10/14/2015   DOS: the patient was seen and examined on 10/14/2015  Patient coming from: The patient is coming from home.  Chief Complaint: Slurred speech yesterday, generalized weakness due to before yesterday  HPI: [name] is a 82 y.o. male with Past medical history of essential hypertension, coronary artery disease, chronic kidney disease, type 2 diabetes mellitus. Patient presents with complaints of slurred speech which has been present for last 3 days. Patient also developed difficulty speaking words that was in his mind yesterday. Patient mentions about having some numbness day before yesterday as well. Patient denies any complaints of similar symptoms in the past no chest pain, no shortness of breath, no fever no chills no headache no dizziness no lightheadedness. No diarrhea no constipation. No recent change in medications reported. Patient recently stopped his aspirin for a dental procedure that was performed on 10/13/2015. Patient takes 81 mg aspirin at home. Patient mentions that he has tried all the statins and has not been able to take any of them.  ED Course: On arrival MRI of the brain was performed which was positive for stroke. Neurology was consulted patient was accepted for transfer to [facility].  At his baseline ambulates without any support And is independent for most of his ADL; manages his medication on his own.  Review of Systems: as mentioned in the history of present illness.  A comprehensive review of the other systems is negative.  Past Medical History:  Diagnosis Date  . Adenomatous polyp   . CAD (coronary artery disease)   . Chronic kidney disease   . Diverticulosis   . DVT (deep venous thrombosis) ([place])   . Gout   . History of pulmonary embolus (PE)    from a DVT in 2008  . Hyperglycemia   .  Hyperlipidemia   . Hypertension   . Mallory-Weiss tear   . Peripheral vascular disease ([place])   . SVT (supraventricular tachycardia) ([place])   . Vitamin B12 deficiency    Past Surgical History:  Procedure Laterality Date  .  PTCA of coronary lesion Right 1995  . APPENDECTOMY    . PR VEIN BYPASS GRAFT,AORTO-FEM-POP Left    femoral-popliteal BP by Dr. Drucie Opitz  . repair of aal fissure   1978   Social History:  reports that he has never smoked. He has never used smokeless tobacco. He reports that he does not drink alcohol or use drugs.  Allergies  Allergen Reactions  . Lipitor [Atorvastatin]     Muscle pain   . Zetia [Ezetimibe] Hives     Family History  Problem Relation Age of Onset  . Cancer Mother     bladder  . CAD Father   . Heart attack Father      Prior to Admission medications   Medication Sig Start Date End Date Taking? Authorizing Provider  amoxicillin (AMOXIL) 500 MG capsule Take 500 mg by mouth every 6 (six) hours.  10/13/15  Yes Historical Provider, MD  aspirin 81 MG tablet Take 81 mg by mouth daily.   Yes Historical Provider, MD  losartan (COZAAR) 50 MG tablet Take 50 mg by mouth daily. 09/14/15  Yes Historical Provider, MD  metoprolol tartrate (LOPRESSOR) 25 MG tablet Take 25 mg by mouth 2 (two) times daily. 08/03/15  Yes Historical Provider, MD    Physical Exam: Vitals:  10/14/15 1738 10/14/15 1800 10/14/15 1830 10/14/15 1900  BP: (!) 225/85 (!) 207/96 (!) 216/89 (!) 204/74  Pulse: 62 (!) 59 62 65  Resp: 12 14 12 18   Temp:      TempSrc:      SpO2: 98% 95% 96% 98%  Weight:      Height:        General: Alert, Awake and Oriented to Time, Place and Person. Appear in mild distress Eyes: PERRL, Conjunctiva normal ENT: Oral Mucosa clear moist. Neck: no JVD, no Abnormal Mass Or lumps Cardiovascular: S1 and S2 Present, no Murmur, Peripheral Pulses Present Respiratory: Bilateral Air entry equal and Decreased, Clear to Auscultation, no Crackles, no  wheezes Abdomen: Bowel Sound present, Soft and no tenderness Skin: no redness, no Rash  Extremities: no Pedal edema, no calf tenderness Neurologic: Grossly no focal neuro deficit. Bilaterally Equal motor strength  Labs on Admission:  CBC:  Recent Labs Lab 10/14/15 1255 10/14/15 1307  WBC 8.6  --   NEUTROABS 5.8  --   HGB 17.1* 17.7*  HCT 50.3 52.0  MCV 89.0  --   PLT 187  --    Basic Metabolic Panel:  Recent Labs Lab 10/14/15 1255 10/14/15 1307  NA 138 140  K 4.5 4.6  CL 104 102  CO2 27  --   GLUCOSE 127* 124*  BUN 25* 27*  CREATININE 1.45* 1.50*  CALCIUM 9.8  --    GFR: Estimated Creatinine Clearance: 35.9 mL/min (by C-G formula based on SCr of 1.5 mg/dL). Liver Function Tests:  Recent Labs Lab 10/14/15 1255  AST 71*  ALT 103*  ALKPHOS 53  BILITOT 0.7  PROT 8.4*  ALBUMIN 4.8   No results for input(s): LIPASE, AMYLASE in the last 168 hours. No results for input(s): AMMONIA in the last 168 hours. Coagulation Profile:  Recent Labs Lab 10/14/15 1255  INR 1.01   Cardiac Enzymes: No results for input(s): CKTOTAL, CKMB, CKMBINDEX, TROPONINI in the last 168 hours. BNP (last 3 results) No results for input(s): PROBNP in the last 8760 hours. HbA1C: No results for input(s): HGBA1C in the last 72 hours. CBG: No results for input(s): GLUCAP in the last 168 hours. Lipid Profile: No results for input(s): CHOL, HDL, LDLCALC, TRIG, CHOLHDL, LDLDIRECT in the last 72 hours. Thyroid Function Tests: No results for input(s): TSH, T4TOTAL, FREET4, T3FREE, THYROIDAB in the last 72 hours. Anemia Panel: No results for input(s): VITAMINB12, FOLATE, FERRITIN, TIBC, IRON, RETICCTPCT in the last 72 hours. Urine analysis:    Component Value Date/Time   COLORURINE YELLOW 10/14/2015 1250   APPEARANCEUR CLEAR 10/14/2015 1250   LABSPEC 1.010 10/14/2015 1250   PHURINE 6.5 10/14/2015 1250   GLUCOSEU NEGATIVE 10/14/2015 1250   HGBUR NEGATIVE 10/14/2015 1250   BILIRUBINUR  NEGATIVE 10/14/2015 1250   KETONESUR NEGATIVE 10/14/2015 1250   PROTEINUR NEGATIVE 10/14/2015 1250   NITRITE NEGATIVE 10/14/2015 1250   LEUKOCYTESUR NEGATIVE 10/14/2015 1250    Radiological Exams on Admission: Mr Brain Wo Contrast  Result Date: 10/14/2015 CLINICAL DATA:  Expressive aphasia. EXAM: MRI HEAD WITHOUT CONTRAST TECHNIQUE: Multiplanar, multiecho pulse sequences of the brain and surrounding structures were obtained without intravenous contrast. COMPARISON:  None. FINDINGS: Acute infarct left pons, moderate in size.  No other acute infarct Mild chronic microvascular ischemic changes in the white matter. Moderate atrophy. Negative for mass or edema.  No shift of the midline structures Pituitary normal in size. Normal orbital contents. Bilateral cataract extraction. Mucosal edema right maxillary sinus.  Remaining  sinuses clear. IMPRESSION: Acute infarct in the left pons. Generalized atrophy with mild chronic microvascular ischemia in the white matter. Electronically Signed   By: Franchot Gallo M.D.   On: 10/14/2015 15:02   EKG: Independently reviewed. normal sinus rhythm, nonspecific ST and T waves changes.  Assessment/Plan 1. CVA (cerebral vascular accident) ([place]) Aspirin 325. Patient unable to tolerate statins. No significant deficit at present. Other than slurred speech. PTOT and speech evaluation. Patient failed a swallowing evaluation bedside swallowing by speech therapy also recommended. Monitor on telemetry. Permissive hypertension but blood pressure is significantly elevated and therefore I would resume home blood pressure medication and use when necessary hydralazine. Monitor on telemetry.  2. Accelerated hypertension with permissive hypertension. Monitor. When necessary hydralazine. Avoid aggressive control of blood pressure.  3 type 2 diabetes mellitus. Check hemoglobin A1c. Continuous every 4 hours sliding scale insulin. Patient and glipizide at home which will be  on hold.  4. Dyslipidemia. Patient unable to tolerate any statins. We'll monitor. Follow lipid profile   Nutrition: npo DVT Prophylaxis: subcutaneous Heparin  Advance goals of care discussion: Full code   Consults: Neurology Dr. Leonel Ramsay consulted by ER  Family Communication: family was present at bedside, at the time of interview.  Opportunity was given to ask question and all questions were answered satisfactorily.  Disposition: Admitted as observation, telemetry unit. Likely to be discharged home, in 2 days.  Author: Berle Mull, MD [facility] Pager: [phone] 10/14/2015  If 7PM-7AM, please contact night-coverage [email or web address] Password [email or web address]

## 2015-10-14 NOTE — ED Notes (Signed)
Patient transported to MRI 

## 2015-10-14 NOTE — ED Notes (Signed)
Patient returned from MRI.

## 2015-10-14 NOTE — ED Notes (Signed)
Neuro checks and vitals signs delayed due to the MRI.

## 2015-10-14 NOTE — ED Notes (Signed)
Hospitalist at bedside 

## 2015-10-14 NOTE — ED Triage Notes (Signed)
Patient presents from [facility] complained of trouble developing his words on Sunday. He was having trouble finding his words. He felt a tingling sensation in the left side of his neck and left upper back. This is persistent since. Having trouble getting certain words out. He felt a little bit dizzy and mildly unsteady when walking. He's had no visual change, trouble swallowing or definite weakness in that extremity. He stopped taking aspirin 3 days prior to the symptoms starting.

## 2015-10-14 NOTE — ED Notes (Signed)
Pharmacy tech in room to reconcile medications

## 2015-10-15 ENCOUNTER — Observation Stay (HOSPITAL_COMMUNITY): Payer: Medicare Other

## 2015-10-15 ENCOUNTER — Observation Stay (HOSPITAL_BASED_OUTPATIENT_CLINIC_OR_DEPARTMENT_OTHER): Payer: Medicare Other

## 2015-10-15 DIAGNOSIS — D72829 Elevated white blood cell count, unspecified: Secondary | ICD-10-CM | POA: Diagnosis not present

## 2015-10-15 DIAGNOSIS — R739 Hyperglycemia, unspecified: Secondary | ICD-10-CM | POA: Diagnosis not present

## 2015-10-15 DIAGNOSIS — R29810 Facial weakness: Secondary | ICD-10-CM | POA: Diagnosis present

## 2015-10-15 DIAGNOSIS — Z86718 Personal history of other venous thrombosis and embolism: Secondary | ICD-10-CM | POA: Diagnosis not present

## 2015-10-15 DIAGNOSIS — M1990 Unspecified osteoarthritis, unspecified site: Secondary | ICD-10-CM | POA: Diagnosis not present

## 2015-10-15 DIAGNOSIS — N183 Chronic kidney disease, stage 3 (moderate): Secondary | ICD-10-CM | POA: Diagnosis present

## 2015-10-15 DIAGNOSIS — I63212 Cerebral infarction due to unspecified occlusion or stenosis of left vertebral arteries: Secondary | ICD-10-CM | POA: Diagnosis not present

## 2015-10-15 DIAGNOSIS — I69391 Dysphagia following cerebral infarction: Secondary | ICD-10-CM | POA: Diagnosis not present

## 2015-10-15 DIAGNOSIS — I6302 Cerebral infarction due to thrombosis of basilar artery: Secondary | ICD-10-CM | POA: Diagnosis not present

## 2015-10-15 DIAGNOSIS — R471 Dysarthria and anarthria: Secondary | ICD-10-CM | POA: Diagnosis present

## 2015-10-15 DIAGNOSIS — I10 Essential (primary) hypertension: Secondary | ICD-10-CM | POA: Diagnosis not present

## 2015-10-15 DIAGNOSIS — Z9861 Coronary angioplasty status: Secondary | ICD-10-CM | POA: Diagnosis not present

## 2015-10-15 DIAGNOSIS — M109 Gout, unspecified: Secondary | ICD-10-CM | POA: Diagnosis present

## 2015-10-15 DIAGNOSIS — R4781 Slurred speech: Secondary | ICD-10-CM | POA: Diagnosis present

## 2015-10-15 DIAGNOSIS — E785 Hyperlipidemia, unspecified: Secondary | ICD-10-CM | POA: Diagnosis present

## 2015-10-15 DIAGNOSIS — Z6827 Body mass index (BMI) 27.0-27.9, adult: Secondary | ICD-10-CM | POA: Diagnosis not present

## 2015-10-15 DIAGNOSIS — I639 Cerebral infarction, unspecified: Secondary | ICD-10-CM | POA: Diagnosis present

## 2015-10-15 DIAGNOSIS — I69322 Dysarthria following cerebral infarction: Secondary | ICD-10-CM | POA: Diagnosis not present

## 2015-10-15 DIAGNOSIS — I6789 Other cerebrovascular disease: Secondary | ICD-10-CM | POA: Diagnosis not present

## 2015-10-15 DIAGNOSIS — I6350 Cerebral infarction due to unspecified occlusion or stenosis of unspecified cerebral artery: Secondary | ICD-10-CM | POA: Diagnosis not present

## 2015-10-15 DIAGNOSIS — E1151 Type 2 diabetes mellitus with diabetic peripheral angiopathy without gangrene: Secondary | ICD-10-CM | POA: Diagnosis present

## 2015-10-15 DIAGNOSIS — F329 Major depressive disorder, single episode, unspecified: Secondary | ICD-10-CM | POA: Diagnosis not present

## 2015-10-15 DIAGNOSIS — M6289 Other specified disorders of muscle: Secondary | ICD-10-CM | POA: Diagnosis not present

## 2015-10-15 DIAGNOSIS — R110 Nausea: Secondary | ICD-10-CM | POA: Diagnosis not present

## 2015-10-15 DIAGNOSIS — I638 Other cerebral infarction: Secondary | ICD-10-CM | POA: Diagnosis not present

## 2015-10-15 DIAGNOSIS — R55 Syncope and collapse: Secondary | ICD-10-CM | POA: Diagnosis not present

## 2015-10-15 DIAGNOSIS — E118 Type 2 diabetes mellitus with unspecified complications: Secondary | ICD-10-CM | POA: Diagnosis not present

## 2015-10-15 DIAGNOSIS — I129 Hypertensive chronic kidney disease with stage 1 through stage 4 chronic kidney disease, or unspecified chronic kidney disease: Secondary | ICD-10-CM | POA: Diagnosis present

## 2015-10-15 DIAGNOSIS — R4701 Aphasia: Secondary | ICD-10-CM | POA: Diagnosis not present

## 2015-10-15 DIAGNOSIS — E1159 Type 2 diabetes mellitus with other circulatory complications: Secondary | ICD-10-CM | POA: Diagnosis not present

## 2015-10-15 DIAGNOSIS — R001 Bradycardia, unspecified: Secondary | ICD-10-CM | POA: Diagnosis not present

## 2015-10-15 DIAGNOSIS — E663 Overweight: Secondary | ICD-10-CM | POA: Diagnosis present

## 2015-10-15 DIAGNOSIS — E1140 Type 2 diabetes mellitus with diabetic neuropathy, unspecified: Secondary | ICD-10-CM | POA: Diagnosis not present

## 2015-10-15 DIAGNOSIS — I2510 Atherosclerotic heart disease of native coronary artery without angina pectoris: Secondary | ICD-10-CM | POA: Diagnosis present

## 2015-10-15 DIAGNOSIS — G8191 Hemiplegia, unspecified affecting right dominant side: Secondary | ICD-10-CM | POA: Diagnosis present

## 2015-10-15 DIAGNOSIS — Z7982 Long term (current) use of aspirin: Secondary | ICD-10-CM | POA: Diagnosis not present

## 2015-10-15 DIAGNOSIS — E1122 Type 2 diabetes mellitus with diabetic chronic kidney disease: Secondary | ICD-10-CM | POA: Diagnosis present

## 2015-10-15 DIAGNOSIS — N390 Urinary tract infection, site not specified: Secondary | ICD-10-CM | POA: Diagnosis not present

## 2015-10-15 DIAGNOSIS — R1310 Dysphagia, unspecified: Secondary | ICD-10-CM | POA: Diagnosis present

## 2015-10-15 DIAGNOSIS — R58 Hemorrhage, not elsewhere classified: Secondary | ICD-10-CM | POA: Diagnosis not present

## 2015-10-15 DIAGNOSIS — I651 Occlusion and stenosis of basilar artery: Secondary | ICD-10-CM | POA: Diagnosis present

## 2015-10-15 DIAGNOSIS — D62 Acute posthemorrhagic anemia: Secondary | ICD-10-CM | POA: Diagnosis not present

## 2015-10-15 DIAGNOSIS — R339 Retention of urine, unspecified: Secondary | ICD-10-CM | POA: Diagnosis not present

## 2015-10-15 DIAGNOSIS — I498 Other specified cardiac arrhythmias: Secondary | ICD-10-CM | POA: Diagnosis not present

## 2015-10-15 DIAGNOSIS — Z86711 Personal history of pulmonary embolism: Secondary | ICD-10-CM | POA: Diagnosis not present

## 2015-10-15 DIAGNOSIS — M79643 Pain in unspecified hand: Secondary | ICD-10-CM | POA: Diagnosis not present

## 2015-10-15 DIAGNOSIS — I455 Other specified heart block: Secondary | ICD-10-CM | POA: Diagnosis not present

## 2015-10-15 DIAGNOSIS — Z8249 Family history of ischemic heart disease and other diseases of the circulatory system: Secondary | ICD-10-CM | POA: Diagnosis not present

## 2015-10-15 DIAGNOSIS — Z79899 Other long term (current) drug therapy: Secondary | ICD-10-CM | POA: Diagnosis not present

## 2015-10-15 DIAGNOSIS — I161 Hypertensive emergency: Secondary | ICD-10-CM | POA: Diagnosis present

## 2015-10-15 DIAGNOSIS — I63512 Cerebral infarction due to unspecified occlusion or stenosis of left middle cerebral artery: Secondary | ICD-10-CM | POA: Diagnosis not present

## 2015-10-15 DIAGNOSIS — R319 Hematuria, unspecified: Secondary | ICD-10-CM | POA: Diagnosis not present

## 2015-10-15 LAB — ECHOCARDIOGRAM COMPLETE
HEIGHTINCHES: 67 in
WEIGHTICAEL: 2835.2 [oz_av]

## 2015-10-15 LAB — GLUCOSE, CAPILLARY
GLUCOSE-CAPILLARY: 103 mg/dL — AB (ref 65–99)
GLUCOSE-CAPILLARY: 148 mg/dL — AB (ref 65–99)
Glucose-Capillary: 114 mg/dL — ABNORMAL HIGH (ref 65–99)
Glucose-Capillary: 119 mg/dL — ABNORMAL HIGH (ref 65–99)
Glucose-Capillary: 160 mg/dL — ABNORMAL HIGH (ref 65–99)
Glucose-Capillary: 195 mg/dL — ABNORMAL HIGH (ref 65–99)

## 2015-10-15 MED ORDER — PRAVASTATIN SODIUM 20 MG PO TABS
20.0000 mg | ORAL_TABLET | Freq: Every day | ORAL | Status: DC
  Administered 2015-10-16 – 2015-10-21 (×5): 20 mg via ORAL
  Filled 2015-10-15 (×5): qty 1

## 2015-10-15 MED ORDER — ACETAMINOPHEN 325 MG PO TABS
650.0000 mg | ORAL_TABLET | Freq: Four times a day (QID) | ORAL | Status: DC | PRN
  Administered 2015-10-15: 650 mg via ORAL
  Filled 2015-10-15: qty 2

## 2015-10-15 MED ORDER — AMOXICILLIN 500 MG PO CAPS
500.0000 mg | ORAL_CAPSULE | Freq: Three times a day (TID) | ORAL | Status: DC
  Administered 2015-10-16 – 2015-10-17 (×5): 500 mg via ORAL
  Filled 2015-10-15 (×7): qty 1

## 2015-10-15 NOTE — Consult Note (Signed)
Neurology Consult Note  Reason for Consultation: stroke  Requesting provider: Daleen Bo, MD  CC: trouble talking  HPI: This is an 30-yo RH man who presented to [facility] ED today after experiencing difficulty speaking for the past 2-3 days. History is obtained directly from the patient who is an excellent historian. I have also reviewed his chart at length.   He reports that approximately 3 days ago, he noticed that he was having some difficulty with his speech. He describes that he has been stumbling over words and at times cannot get them out appropriately. His symptoms have remained stable since he first noticed them. He initially denied any additional symptoms. However, further in conversation, he recalled that he was having difficulty moving his right leg from the gas to the brake when he was driving 2 days ago. I notice in the emergency department physician's note that it was mentioned that his PCP felt he was favoring his right leg while walking. The patient tells me that this has been a long-standing issue for the past several years and he does not feel like his walking has been any different over the past couple of days. The only other symptom that he reports is a crawling sensation behind his left ear that extends into the left occipital region. This is intermittent and appeared a few days before his speech deficits. He denies any headache or pain in his neck. He denies any vision loss, double vision, vertigo, hearing changes, difficulty swallowing, numbness, weakness, balance problems, or gait changes.  He had no intention of coming to the hospital to be evaluated. However, he states that this morning a family member felt like his lips looked blue so they took him to his primary care physician. During this visit, his PCP noted that he was having difficulty with his speech and sent him to the emergency department. Her graph of note, the patient typically takes a baby aspirin every day.  However, he stopped taking this on 10/11/15 due to some upcoming dental work.  PMH:  Past Medical History:  Diagnosis Date  . Adenomatous polyp   . CAD (coronary artery disease)   . Chronic kidney disease   . Diverticulosis   . DVT (deep venous thrombosis) ([place])   . Gout   . History of pulmonary embolus (PE)    from a DVT in 2008  . Hyperglycemia   . Hyperlipidemia   . Hypertension   . Mallory-Weiss tear   . Peripheral vascular disease ([place])   . SVT (supraventricular tachycardia) ([place])   . Vitamin B12 deficiency     PSH:  Past Surgical History:  Procedure Laterality Date  .  PTCA of coronary lesion Right 1995  . APPENDECTOMY    . PR VEIN BYPASS GRAFT,AORTO-FEM-POP Left    femoral-popliteal BP by Dr. Drucie Opitz  . repair of aal fissure   1978    Family history: Family History  Problem Relation Age of Onset  . Cancer Mother     bladder  . CAD Father   . Heart attack Father     Social history:  Social History   Social History  . Marital status: Widowed    Spouse name: N/A  . Number of children: N/A  . Years of education: N/A   Occupational History  . Not on file.   Social History Main Topics  . Smoking status: Never Smoker  . Smokeless tobacco: Never Used  . Alcohol use No  . Drug use: No  .  Sexual activity: Not on file   Other Topics Concern  . Not on file   Social History Narrative  . No narrative on file    Current outpatient meds: Current Meds  Medication Sig  . amoxicillin (AMOXIL) 500 MG capsule Take 500 mg by mouth every 6 (six) hours.   Marland Kitchen aspirin 81 MG tablet Take 81 mg by mouth daily.  Marland Kitchen losartan (COZAAR) 50 MG tablet Take 50 mg by mouth daily.  . metoprolol tartrate (LOPRESSOR) 25 MG tablet Take 25 mg by mouth 2 (two) times daily.    Current inpatient meds:  Current Facility-Administered Medications  Medication Dose Route Frequency Provider Last Rate Last Dose  .  stroke: mapping our early stages of recovery book   Does not apply  Once Lavina Hamman, MD      . aspirin suppository 300 mg  300 mg Rectal Daily Lavina Hamman, MD   300 mg at 10/14/15 2305   Or  . aspirin tablet 325 mg  325 mg Oral Daily Lavina Hamman, MD      . heparin injection 5,000 Units  5,000 Units Subcutaneous Q8H Lavina Hamman, MD   5,000 Units at 10/14/15 2326  . hydrALAZINE (APRESOLINE) injection 5 mg  5 mg Intravenous Q6H PRN Lavina Hamman, MD      . insulin aspart (novoLOG) injection 0-9 Units  0-9 Units Subcutaneous Q4H Lavina Hamman, MD      . losartan (COZAAR) tablet 50 mg  50 mg Oral Daily Lavina Hamman, MD      . metoprolol tartrate (LOPRESSOR) tablet 25 mg  25 mg Oral BID Lavina Hamman, MD      . senna-docusate (Senokot-S) tablet 1 tablet  1 tablet Oral QHS PRN Lavina Hamman, MD        Allergies: Allergies  Allergen Reactions  . Lipitor [Atorvastatin]     Muscle pain   . Zetia [Ezetimibe] Hives    ROS: As per HPI. A full 14-point review of systems was performed and is otherwise notable for chronic pain in his right lower extremity related to vascular disease. He has frequent cramping in his right calf and foot. He also reports that he has weakness in his right leg but it sounds like he is describing vascular claudication. He reports that he has some difficulty initiating his urinary stream but feels that he empties his bladder completely once he starts. He has some tightness in the muscles in his neck and base of his skull. The remainder of his review of systems is unremarkable. PE:  BP (!) 178/72 (BP Location: Right Arm)   Pulse (!) 59   Temp 98.2 F (36.8 C)   Resp 17   Ht 5\' 7"  (1.702 m)   Wt 80.4 kg (177 lb 3.2 oz)   SpO2 95%   BMI 27.75 kg/m   General: WDWN, no acute distress. AAO x4.  He has minimal dysarthria but does have some occasional hesitancy with a tendency to stumble over words periodically. No aphasia. Follows commands briskly. Affect is bright with congruent mood. Comportment is normal.  HEENT:  Normocephalic. Neck supple without LAD.  He has mild muscle tightness in the cervical portion of the trapezius muscle and along the nuchal ridge. MMM, OP clear. Dentition good. Sclerae anicteric. No conjunctival injection.  CV: Regular, no murmur. Carotid pulses full and symmetric, no bruits. Distal pulses 1+. Lungs: CTAB.  Abdomen: Soft, non-distended, non-tender. Bowel sounds present x4.  Extremities:  No C/C/E. He has chronic hyperpigmentation consistent with vascular insufficiency in both lower extremities below the knees. Neuro:  CN: Pupils are equal and round. They are symmetrically reactive from 3-->2 mm. EOMI  are notable for saccadic pursuits without nystagmus. No reported diplopia. Facial sensation is intact to light touch. Face is symmetric at rest with normal strength and mobility. Hearing is intact to conversational voice. Palate elevates symmetrically and uvula is midline. Voice is normal in tone, pitch and quality. Bilateral SCM and trapezii are 5/5. Tongue is midline with normal bulk and mobility.  Motor: Normal bulk, tone, and strength. He developed some muscle cramping in the right calf during strength testing in that extremity. No tremor or other abnormal movements. There is a slight drift of the left arm without pronation .  Sensation: Intact to light touch. Vibration is markedly diminished in both lower extremities to the level of the knee.  DTRs: 2+, symmetric with absent ankle jerks bilaterally. Toes mute bilaterally. No pathologic reflexes.  Coordination: Finger-to-nose is slower and more deliberate on the right but without dysmetria. Finger taps are slower on the right.   Labs:  Lab Results  Component Value Date   WBC 8.6 10/14/2015   HGB 17.7 (H) 10/14/2015   HCT 52.0 10/14/2015   PLT 187 10/14/2015   GLUCOSE 124 (H) 10/14/2015   ALT 103 (H) 10/14/2015   AST 71 (H) 10/14/2015   NA 140 10/14/2015   K 4.6 10/14/2015   CL 102 10/14/2015   CREATININE 1.50 (H) 10/14/2015    BUN 27 (H) 10/14/2015   CO2 27 10/14/2015   INR 1.01 10/14/2015    Imaging:  I have personally and independently reviewed the MRI scan of the brain without contrast from 10/14/15. This shows restricted diffusion in the left paramedian pons consistent with acute ischemic infarction. He has a mild burden of chronic small vessel ischemic change. Moderate diffuse generalized atrophy is present with hydrocephalus ex vacuo. There is a prominent sulcus in the right frontal lobe likely represents anatomic variation.  I have personally and independently reviewed the MRA of the head without contrast from 10/14/15. He has a dominant right vertebral artery with the left vertebral poorly visualized along the majority of its course. There is some filling of the distal segment of the left vertebral artery just proximal to the origin of the basilar artery which may be retrograde filling. This may represent anatomic variation versus possible occlusion of this vessel. There is some irregularity along the course of the basilar artery with some moderate narrowing in the middle of the vessel, consistent with atherosclerotic change. There is also irregular narrowing in both internal carotid arteries, worse on the left than the right, again consistent with atherosclerotic change. A focal area of narrowing is noted in the distal portion of the left posterior cerebral artery. A focal area of irregularity is noted in the left MCA near the junction with the left posterior communicating artery that measures approximately 1.4 mm that is of unclear significance.  I have reviewed the radiologist's interpretations of this scan and agree with reported findings.   Assessment and Plan:  1. Acute Ischemic Stroke: This is an acute stroke involving the paramedian left pons via a pontine perforator. It is most likely thrombotic in etiology though given his reports of dysesthesias around the left ear and poor visualization of the  intracranial left vertebral artery, consideration must also be given to vertebral dissection. Known risk factors for cerebrovascular disease in this patient include  coronary artery disease, peripheral arterial disease, diabetes, hypertension, hyperlipidemia, and age. TTE, carotid Dopplers, fasting lipids, and hemoglobin a1c have been ordered and are pending. Given the abnormalities in the vertebral artery and the left MCA noted on MRA of the head, CT angiogram of the head and neck may be helpful to further assess, though his creatinine is borderline at 1.5. It is unclear what his baseline creatinine is but it may be prudent to avoid iodinated contrast until his creatinine improves. Further testing will be determined by results from these initial studies. This event occurred after he stopped taking his aspirin in preparation for some dental work. Recommend resuming antiplatelet therapy with aspirin 81 mg daily for secondary stroke prevention once cleared to take oral medications. In the meantime, this can be given via suppository. He has not been able to tolerate statins in the past because of muscle discomfort so these will be deferred at this time. Ensure adequate glucose control. Allow permissive hypertension in the acute phase, treating only SBP greater than 220 mmHg and/or DBP greater than 110 mmHg. Avoid fever and hyperglycemia as these can extend the infarct. Avoid hypotonic IVF to minimize exacerbation of post-stroke edema. Initiate rehab services. DVT prophylaxis as needed.   2. Dysarthria: This is acute, consistent with pontine infarction. At this time, he appears to have some hesitancy with his speech and stumbles over words every now and then but all in all his speech is relatively clear. Speech therapy to evaluate and treat.  3. Dysphagia: This is acute, consistent with pontine infarction. He did not pass his swallow screen in the emergency department and remains nothing by mouth until he can be  cleared by speech therapy.  4. Right lower extremity weakness: This is acute, consistent with pontine infarction. He reports some difficulty with moving his leg when he was trying to drive. He does not have any significant weakness on confrontational testing tonight. Recommend physical therapy to evaluate and treat.  This was discussed with the patient and he is in agreement with the plan as noted. He was given the opportunity to ask any questions and these were addressed to his satisfaction.  Thank you for the opportunity to participate in this patient's care. Please don't hesitate to call with any questions or concerns. The stroke team will assume care moving forward.

## 2015-10-15 NOTE — Progress Notes (Signed)
Rehab Admissions Coordinator Note:  Patient was screened by Retta Diones for appropriateness for an Inpatient Acute Rehab Consult.  At this time, we are recommending Inpatient Rehab consult.  Retta Diones 10/15/2015, 4:02 PM  I can be reached at [phone].

## 2015-10-15 NOTE — Progress Notes (Signed)
**  Preliminary report by tech**  Carotid duplex completed. Findings are consistent with a 1-39 percent stenosis involving the right internal carotid artery and the left internal carotid artery. The vertebral arteries demonstrate antegrade flow.  10/15/15 11:56 AM [name] RVT

## 2015-10-15 NOTE — Progress Notes (Signed)
PROGRESS NOTE    [name]  [id number] DOB: 08/30/1927 DOA: 10/14/2015 PCP: Mathews Argyle, MD   Brief Narrative:  [name] is a pleasant 82 year old gentleman with a past medical history of coronary artery disease, hypertension, chronic disease, diabetes mellitus admitted to medicine service on 10/14/2015 when he presented with complaints of slurred speech, workup included MRI of brain that revealed an acute infarct in the left pons.    Assessment & Plan:   Principal Problem:   CVA (cerebral vascular accident) [facility]) Active Problems:   Peripheral vascular disease, unspecified ([place])   CVA (cerebral infarction)   Accelerated hypertension   Type 2 diabetes mellitus ([place])  1.  Acute CVA -[name] presenting with a 3 day history of dysarthria, workup included MRI of brain showing an acute CVA involving left pons. He was evaluated by neurology overnight. -Carotid Dopplers showed 1-39% stenosis involving right and left internal carotid arteries, transthoracic echocardiogram showing EF of 60-65% with grade 1 diastolic dysfunction -Continue antiplatelet therapy with aspirin -Pending fasting lipid panel, patient having a statin allergy listed -Await further recommendations from stroke team  2.  Hypertension. -Patient presented with stroke, allowing for permissive hypertension, breaking blood pressures down slowly. Continue Cozaar 50 mg by mouth dail and metoprolol 25 mg by mouth twice a day.   3.  Chronic kidney disease  -Lab work showing creatinine of 1.5   DVT prophylaxis: subcutaneous heparin  Code Status: Full Code  Family Communication:  Disposition Plan:   Consultants:   Neurology   Procedures:     Subjective: Reporting some improvement to dysarthria, denies new focal neurological deficits   Objective: Vitals:   10/15/15 1018 10/15/15 1042 10/15/15 1248 10/15/15 1455  BP:  (!) 192/72 (!) 165/80 (!) 151/69  Pulse:  87  77  Resp:    18  Temp: 100 F (37.8 C)    97.4 F (36.3 C)  TempSrc:    Oral  SpO2:    96%  Weight:      Height:        Intake/Output Summary (Last 24 hours) at 10/15/15 1518 Last data filed at 10/15/15 1035  Gross per 24 hour  Intake              200 ml  Output                0 ml  Net              200 ml   Filed Weights   10/14/15 1233  Weight: 80.4 kg (177 lb 3.2 oz)    Examination:  General exam: Appears calm and comfortable, no acute distress   Respiratory system: Clear to auscultation. Respiratory effort normal. Cardiovascular system: S1 & S2 heard, RRR. No JVD, murmurs, rubs, gallops or clicks. No pedal edema. Gastrointestinal system: Abdomen is nondistended, soft and nontender. No organomegaly or masses felt. Normal bowel sounds heard. Central nervous system: he has mild dysarthria, no other deficits, 5-5 strength to bilateral upper and lower extremities  Extremities: Symmetric 5 x 5 power. Skin: No rashes, lesions or ulcers Psychiatry: has mild dysarthria  Data Reviewed: I have personally reviewed following labs and imaging studies  CBC:  Recent Labs Lab 10/14/15 1255 10/14/15 1307  WBC 8.6  --   NEUTROABS 5.8  --   HGB 17.1* 17.7*  HCT 50.3 52.0  MCV 89.0  --   PLT 187  --    Basic Metabolic Panel:  Recent Labs Lab 10/14/15 1255 10/14/15  1307  NA 138 140  K 4.5 4.6  CL 104 102  CO2 27  --   GLUCOSE 127* 124*  BUN 25* 27*  CREATININE 1.45* 1.50*  CALCIUM 9.8  --    GFR: Estimated Creatinine Clearance: 35.9 mL/min (by C-G formula based on SCr of 1.5 mg/dL). Liver Function Tests:  Recent Labs Lab 10/14/15 1255  AST 71*  ALT 103*  ALKPHOS 53  BILITOT 0.7  PROT 8.4*  ALBUMIN 4.8   No results for input(s): LIPASE, AMYLASE in the last 168 hours. No results for input(s): AMMONIA in the last 168 hours. Coagulation Profile:  Recent Labs Lab 10/14/15 1255  INR 1.01   Cardiac Enzymes: No results for input(s): CKTOTAL, CKMB, CKMBINDEX, TROPONINI in the last 168 hours. BNP  (last 3 results) No results for input(s): PROBNP in the last 8760 hours. HbA1C: No results for input(s): HGBA1C in the last 72 hours. CBG:  Recent Labs Lab 10/14/15 2033 10/15/15 0022 10/15/15 0409 10/15/15 0751 10/15/15 1240  GLUCAP 112* 119* 103* 114* 160*   Lipid Profile: No results for input(s): CHOL, HDL, LDLCALC, TRIG, CHOLHDL, LDLDIRECT in the last 72 hours. Thyroid Function Tests: No results for input(s): TSH, T4TOTAL, FREET4, T3FREE, THYROIDAB in the last 72 hours. Anemia Panel: No results for input(s): VITAMINB12, FOLATE, FERRITIN, TIBC, IRON, RETICCTPCT in the last 72 hours. Sepsis Labs: No results for input(s): PROCALCITON, LATICACIDVEN in the last 168 hours.  No results found for this or any previous visit (from the past 240 hour(s)).       Radiology Studies: Samuel Brain Wo Contrast  Result Date: 10/14/2015 CLINICAL DATA:  Expressive aphasia. EXAM: MRI HEAD WITHOUT CONTRAST TECHNIQUE: Multiplanar, multiecho pulse sequences of the brain and surrounding structures were obtained without intravenous contrast. COMPARISON:  None. FINDINGS: Acute infarct left pons, moderate in size.  No other acute infarct Mild chronic microvascular ischemic changes in the white matter. Moderate atrophy. Negative for mass or edema.  No shift of the midline structures Pituitary normal in size. Normal orbital contents. Bilateral cataract extraction. Mucosal edema right maxillary sinus.  Remaining sinuses clear. IMPRESSION: Acute infarct in the left pons. Generalized atrophy with mild chronic microvascular ischemia in the white matter. Electronically Signed   By: Franchot Gallo M.D.   On: 10/14/2015 15:02   Samuel Jodene Nam Head/brain [id number] Cm  Result Date: 10/14/2015 CLINICAL DATA:  Expressive aphasia EXAM: MRA HEAD WITHOUT CONTRAST TECHNIQUE: Angiographic images of the Circle of Willis were obtained using MRA technique without intravenous contrast. COMPARISON:  Same day brain MRI FINDINGS: Intracranial  internal carotid arteries: There is moderate to severe narrowing of the intracranial left internal carotid artery, worst at the cavernous and clinoid segments. There is multifocal moderate atherosclerotic narrowing of the intracranial right ICA. Anterior cerebral arteries: Normal. Middle cerebral arteries: Possible 1.5 cm aneurysm versus atherosclerotic irregularity at the left P-comm origin. Otherwise normal. Posterior communicating arteries: Absent bilaterally Posterior cerebral arteries: Normal. Basilar artery: There is moderate narrowing of the midportion of the basilar artery. Vertebral arteries: Right-dominant. Minimal flow related enhancement seen within the proximal V4 segment of the left vertebral artery. Superior cerebellar arteries: Normal. Anterior inferior cerebellar arteries: Normal. Posterior inferior cerebellar arteries: Normal. IMPRESSION: 1. Moderate narrowing of the midportion of the basilar artery, likely secondary to atherosclerotic disease. 2. Multifocal moderate to severe narrowing of the intracranial internal carotid arteries, left worse than right. 3. Possible 1.5 mm aneurysm versus atherosclerotic irregularity at the left posterior communicating artery origin. Electronically Signed   By:  Ulyses Jarred M.D.   On: 10/14/2015 20:28        Scheduled Meds: . aspirin  300 mg Rectal Daily   Or  . aspirin  325 mg Oral Daily  . heparin  5,000 Units Subcutaneous Q8H  . insulin aspart  0-9 Units Subcutaneous Q4H  . losartan  50 mg Oral Daily  . metoprolol tartrate  25 mg Oral BID   Continuous Infusions:    LOS: 0 days    Time spent: 30 min    Kelvin Cellar, MD [facility] Pager [phone]  If 7PM-7AM, please contact night-coverage [email or web address] Password [email or web address] 10/15/2015, 3:18 PM

## 2015-10-15 NOTE — Evaluation (Signed)
Occupational Therapy Evaluation Patient Details Name: [name] MRN: [id number] DOB: 1927/09/11 Today's Date: 10/15/2015    History of Present Illness 82 y.o. male admitted for slurred speech and RLE weakness. MRI (+) for acute L pons infarct. PMH significant for HTN, HLD, CAD, SVT, PVD, hx of DVT and resulting PE (2008), gout, and CKD.    Clinical Impression   PTA, pt was independent with all ADLs, IADLs, and mobility. Pt currently presents with RLE weakness, balance deficits, dysarthria, word finding difficulties, and emotional lability. Pt required min assist for basic transfers and ADLs primarily for balance and redirection to task due to emotional lability. Unable to fully assess vision and cognition as pt was very emotional and was crying spontaneously throughout session. Will plan to assess these areas of function in subsequent sessions. Feel pt is an excellent candidate for CIR as he great potential to reach mod I level of functioning and has a very supportive family. Will continue to follow acutely.    Follow Up Recommendations  CIR    Equipment Recommendations  Other (comment) (TBD at next venue)    Recommendations for Other Services Rehab consult     Precautions / Restrictions Precautions Precautions: Fall Restrictions Weight Bearing Restrictions: No      Mobility Bed Mobility Overal bed mobility: Needs Assistance Bed Mobility: Supine to Sit;Sit to Supine     Supine to sit: Min assist Sit to supine: Min assist   General bed mobility comments: Min assist for trunk support to come to sitting. Assist to reposition in bed as well.   Transfers Overall transfer level: Needs assistance Equipment used: Rolling walker (2 wheeled);None Transfers: Sit to/from Stand Sit to Stand: Min assist         General transfer comment: Min assist for boost to stand to stabilize balance upon standing. Initial sit-stand pt attempted to stand without RW and was unable to take steps  without UE support from therapist. Pt performed much better with RW and required min guard assist for short-distance ambulation.    Balance Overall balance assessment: Needs assistance Sitting-balance support: No upper extremity supported;Feet supported Sitting balance-Leahy Scale: Good     Standing balance support: No upper extremity supported;During functional activity Standing balance-Leahy Scale: Poor Standing balance comment: Pt performed better with RW for transfers and ambulation.                            ADL                                               Vision Additional Comments: Did not test as pt was very emotional throughout session and required consoling   Perception     Praxis      Pertinent Vitals/Pain Pain Assessment: No/denies pain     Hand Dominance Right   Extremity/Trunk Assessment Upper Extremity Assessment Upper Extremity Assessment: Overall WFL for tasks assessed   Lower Extremity Assessment Lower Extremity Assessment: Defer to PT evaluation   Cervical / Trunk Assessment Cervical / Trunk Assessment: Kyphotic   Communication Communication Communication: Expressive difficulties (Dysarthric. word finding difficulty - worse this PM per pt)   Cognition Arousal/Alertness: Awake/alert Behavior During Therapy:  (emotionally labile) Overall Cognitive Status: Difficult to assess  General Comments       Exercises       Shoulder Instructions      Home Living Family/patient expects to be discharged to:: Private residence Living Arrangements: Alone Available Help at Discharge: Family Type of Home: House                       Home Equipment: Gilford Rile - 2 wheels   Additional Comments: Unable to obtain as pt was very emotional and crying when asked about home  Lives With: Alone    Prior Functioning/Environment Level of Independence: Independent             OT Diagnosis:  Paresis;Cognitive deficits   OT Problem List: Decreased strength;Decreased activity tolerance;Impaired balance (sitting and/or standing);Decreased coordination;Decreased cognition;Decreased safety awareness;Decreased knowledge of use of DME or AE   OT Treatment/Interventions: Self-care/ADL training;Therapeutic exercise;Neuromuscular education;DME and/or AE instruction;Therapeutic activities;Cognitive remediation/compensation;Patient/family education;Balance training    OT Goals(Current goals can be found in the care plan section) Acute Rehab OT Goals Patient Stated Goal: "just get me home" OT Goal Formulation: With patient Time For Goal Achievement: 10/29/15 Potential to Achieve Goals: Good ADL Goals Pt Will Perform Grooming: with modified independence;standing Pt Will Perform Upper Body Bathing: with modified independence;sitting Pt Will Perform Lower Body Bathing: with modified independence;sit to/from stand Pt Will Transfer to Toilet: with modified independence;regular height toilet;ambulating Pt Will Perform Toileting - Clothing Manipulation and hygiene: with modified independence;sit to/from stand  OT Frequency: Min 3X/week   Barriers to D/C: Decreased caregiver support  Lives alone, has very supportive family       Co-evaluation              End of Session Equipment Utilized During Treatment: Gait belt;Rolling walker Nurse Communication: Mobility status  Activity Tolerance: Other (comment) (Pt limited by emotional lability) Patient left: in bed;with call bell/phone within reach;with family/visitor present   Time: [id number] OT Time Calculation (min): 30 min Charges:  OT General Charges $OT Visit: 1 Procedure OT Evaluation $OT Eval Moderate Complexity: 1 Procedure OT Treatments $Self Care/Home Management : 8-22 mins G-Codes:    Redmond Baseman, OTR/L Pager: [phone] 10/15/2015, 3:53 PM

## 2015-10-15 NOTE — Progress Notes (Signed)
STROKE TEAM PROGRESS NOTE   HISTORY OF PRESENT ILLNESS (per record) This is an 82-yo RH man who presented to [facility] ED today after experiencing difficulty speaking for the past 2-3 days. History is obtained directly from the patient who is an excellent historian.   He reports that approximately 3 days ago, he noticed that he was having some difficulty with his speech. He describes that he has been stumbling over words and at times cannot get them out appropriately. His symptoms have remained stable since he first noticed them. He initially denied any additional symptoms. However, further in conversation, he recalled that he was having difficulty moving his right leg from the gas to the brake when he was driving 2 days ago. In the emergency department physician's note it was mentioned that his PCP felt he was favoring his right leg while walking. The patient tells that this has been a long-standing issue for the past several years and he does not feel like his walking has been any different over the past couple of days. The only other symptom that he reports is a crawling sensation behind his left ear that extends into the left occipital region. This is intermittent and appeared a few days before his speech deficits. He denies any headache or pain in his neck. He denies any vision loss, double vision, vertigo, hearing changes, difficulty swallowing, numbness, weakness, balance problems, or gait changes.  He had no intention of coming to the hospital to be evaluated. However, he states that this morning a family member felt like his lips looked blue so they took him to his primary care physician. During this visit, his PCP noted that he was having difficulty with his speech and sent him to the emergency department. Her graph of note, the patient typically takes a baby aspirin every day. However, he stopped taking this on 10/11/15 due to some upcoming dental work.  Patient was not considered for IV t-PA  secondary to unclear onset. He was admitted for further evaluation and treatment.   SUBJECTIVE (INTERVAL HISTORY) Daughter is at bedside. Pt has chronic right LE weakness. Still has right facial droop and mild dysarthria. Hx of SVT and treated with IV injection and stated that Valsalva could stop the episode. He used to follow up with Dr. Tamala Julian in cardiology several years ago. Had hx of PE but only once. Denies DVT hx.    OBJECTIVE Temp:  [97.7 F (36.5 C)-98.5 F (36.9 C)] 98.2 F (36.8 C) (08/17 0535) Pulse Rate:  [55-70] 63 (08/17 0535) Cardiac Rhythm: Normal sinus rhythm;Bundle branch block;Heart block (08/16 2127) Resp:  [11-18] 18 (08/17 0535) BP: (171-229)/(72-102) 171/75 (08/17 0535) SpO2:  [95 %-100 %] 99 % (08/17 0535) Weight:  [80.4 kg (177 lb 3.2 oz)] 80.4 kg (177 lb 3.2 oz) (08/16 1233)  CBC:  Recent Labs Lab 10/14/15 1255 10/14/15 1307  WBC 8.6  --   NEUTROABS 5.8  --   HGB 17.1* 17.7*  HCT 50.3 52.0  MCV 89.0  --   PLT 187  --     Basic Metabolic Panel:  Recent Labs Lab 10/14/15 1255 10/14/15 1307  NA 138 140  K 4.5 4.6  CL 104 102  CO2 27  --   GLUCOSE 127* 124*  BUN 25* 27*  CREATININE 1.45* 1.50*  CALCIUM 9.8  --     Lipid Panel: No results found for: CHOL, TRIG, HDL, CHOLHDL, VLDL, LDLCALC HgbA1c: No results found for: HGBA1C Urine Drug Screen:  Component Value Date/Time   LABOPIA NONE DETECTED 10/14/2015 1250   COCAINSCRNUR NONE DETECTED 10/14/2015 1250   LABBENZ NONE DETECTED 10/14/2015 1250   AMPHETMU NONE DETECTED 10/14/2015 1250   THCU NONE DETECTED 10/14/2015 1250   LABBARB NONE DETECTED 10/14/2015 1250      IMAGING I have personally reviewed the radiological images below and agree with the radiology interpretations.  Mr Brain Wo Contrast 10/14/2015 Acute infarct in the left pons. Generalized atrophy with mild chronic microvascular ischemia in the white matter.   Mr Jodene Nam Head/brain Wo Cm 10/14/2015 1. Moderate narrowing of  the midportion of the basilar artery, likely secondary to atherosclerotic disease. 2. Multifocal moderate to severe narrowing of the intracranial internal carotid arteries, left worse than right. 3. Possible 1.5 mm aneurysm versus atherosclerotic irregularity at the left posterior communicating artery origin.   Carotid Doppler   There is 1-39% bilateral ICA stenosis. Vertebral artery flow is antegrade.    TTE -  - Left ventricle: The cavity size was normal. Systolic function was   normal. The estimated ejection fraction was in the range of 60%   to 65%. Wall motion was normal; there were no regional wall   motion abnormalities. Doppler parameters are consistent with   abnormal left ventricular relaxation (grade 1 diastolic   dysfunction). Doppler parameters are consistent with   indeterminate ventricular filling pressure. - Aortic valve: Transvalvular velocity was within the normal range.   There was no stenosis. There was mild regurgitation. - Mitral valve: Transvalvular velocity was within the normal range.   There was no evidence for stenosis. There was mild regurgitation. - Right ventricle: The cavity size was normal. Wall thickness was   normal. Systolic function was normal. - Atrial septum: No defect or patent foramen ovale was identified   by color flow Doppler. - Tricuspid valve: There was no regurgitation.   PHYSICAL EXAM  Temp:  [97.4 F (36.3 C)-100 F (37.8 C)] 97.4 F (36.3 C) (08/17 1455) Pulse Rate:  [59-87] 77 (08/17 1455) Resp:  [12-19] 18 (08/17 1455) BP: (151-229)/(68-90) 151/69 (08/17 1455) SpO2:  [94 %-99 %] 96 % (08/17 1455)  General - Well nourished, well developed, in no apparent distress.  Ophthalmologic - Sharp disc margins OU.   Cardiovascular - Regular rate and rhythm.  Mental Status -  Level of arousal and orientation to time, place, and person were intact. Language including expression, naming, repetition, comprehension was assessed and found  intact. Fund of Knowledge was assessed and was intact.  Cranial Nerves II - XII - II - Visual field intact OU. III, IV, VI - Extraocular movements intact. V - Facial sensation intact bilaterally. VII - right facial droop. VIII - Hearing & vestibular intact bilaterally. X - Palate elevates symmetrically. XI - Chin turning & shoulder shrug intact bilaterally XII - Tongue protrusion intact.  Motor Strength - The patient's strength was normal in all extremities except subtle right dexterity difficulty and RLE 5-/5 proximal and pronator drift was absent.  Bulk was normal and fasciculations were absent.   Motor Tone - Muscle tone was assessed at the neck and appendages and was normal.  Reflexes - The patient's reflexes were 1+ in all extremities and he had no pathological reflexes.  Sensory - Light touch, temperature/pinprick were assessed and were symmetrical.    Coordination - The patient had normal movements in the hands and feet with no ataxia or dysmetria.  Tremor was absent.  Gait and Station - deferred.   ASSESSMENT/PLAN Mr. Mckinnon  Hammerschmidt is a 82 y.o. male with history of hypertension, coronary artery disease, chronic kidney disease, type 2 diabetes mellitus presenting with trouble talking. He did not receive IV t-PA due to unknown onset, LKW.   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 L > R narrowing intracranial arteries. Possible L PCA 1.5 mm aneurysm  Carotid Doppler  No significant stenosis   2D Echo  EF 60-65%  LDL pending  HgbA1c pending  Heparin 5000 units sq tid for VTE prophylaxis  DIET DYS 3 Room service appropriate? Yes; Fluid consistency: Thin  aspirin 81 mg daily prior to admission, now on aspirin 325 mg daily. Due to BA stenosis, recommend DAPT for 3 months and then plavix alone.  Patient  counseled to be compliant with his antithrombotic medications  Ongoing aggressive stroke risk factor management  Therapy recommendations:  pending   Disposition:  pending   Hx of SVT  Followed with Dr. Tamala Julian in cardiology in the past  Treated with IV injection  Valsalva helped to abort episodes  Do not feel needs further cardiac event monitoring as current stroke likely small vessel event and hx of SVT pretty typical.  Hypertensive Emergency  BP 225/102 on arrival in setting of neurologic symptoms  Remains elevated  Permissive hypertension (OK if < 220/120) but gradually normalize in 5-7 days  Long-term BP goal 130-150 due to BA stenosis  Hyperlipidemia  Home meds:  No statin  LDL pending, goal < 70  Hx of lipitor allergy  Put on low dose pravastatin  Continue statin at discharge  DM  A1C pending  SSI  CBG monitoring  Other Stroke Risk Factors  Advanced age  Overweight, Body mass index is 27.75 kg/m., recommend weight loss, diet and exercise as appropriate   Coronary artery disease - s/p PTCA  PVD s/p fem pop bypass  Other Active Problems  CKD  Hx PE  Tooth extraction - follow up with dentist for antiplatelet management perioperatively  Hospital day # 0  Neurology will sign off. Please call with questions. Pt will follow up with Cecille Rubin NP at [facility] in about 2 months. Thanks for the consult.  Rosalin Hawking, MD PhD Stroke Neurology 10/15/2015 6:20 PM     To contact Stroke Continuity provider, please refer to [email or web address] After hours, contact General Neurology

## 2015-10-15 NOTE — Evaluation (Signed)
Speech Language Pathology Evaluation Patient Details Name: [name] MRN: [id number] DOB: 05-28-1927 Today's Date: 10/15/2015 Time: [id number] SLP Time Calculation (min) (ACUTE ONLY): 49 min  Problem List:  Patient Active Problem List   Diagnosis Date Noted  . CVA (cerebral vascular accident) ([place]) 10/14/2015  . CVA (cerebral infarction) 10/14/2015  . Accelerated hypertension 10/14/2015  . Type 2 diabetes mellitus ([place]) 10/14/2015  . Peripheral vascular disease, unspecified ([place]) 06/04/2013   Past Medical History:  Past Medical History:  Diagnosis Date  . Adenomatous polyp   . CAD (coronary artery disease)   . Chronic kidney disease   . Diverticulosis   . DVT (deep venous thrombosis) ([place])   . Gout   . History of pulmonary embolus (PE)    from a DVT in 2008  . Hyperglycemia   . Hyperlipidemia   . Hypertension   . Mallory-Weiss tear   . Peripheral vascular disease ([place])   . SVT (supraventricular tachycardia) ([place])   . Vitamin B12 deficiency    Past Surgical History:  Past Surgical History:  Procedure Laterality Date  .  PTCA of coronary lesion Right 1995  . APPENDECTOMY    . PR VEIN BYPASS GRAFT,AORTO-FEM-POP Left    femoral-popliteal BP by Dr. Drucie Opitz  . repair of aal fissure   1978   HPI:  Pt is an 82 y/o m admitted due to neuro changes including "stumbling over words". He has been diagnosed with paramedian pons stroke.   Assessment / Plan / Recommendation Clinical Impression  Pt seen for cognitive-linguistic and speech assessment following admission for stroke. Pt reports feeling like he is "stumbling over" his words in the last few days. Pt does not appear to have language impairment based on tasks focusing on expressive and receptive abilities. Basic cognition appears to be intact. Possible mild impairment noted with motor speech as speech production is fluent, but characterized by mild intermittent hesitations and occasional difficulty with pronunciation. Pt is  aware of deficits and was able to benefit from slowed rate of speech. Difficulties are most recognized during complex repetition tasks. Will follow for further differential treatment. Current limitations do not appear to impede pt's independence and he is able to utilize circumlocution to compensate for deficits with min A. Will follow.     SLP Assessment  Patient needs continued Speech Lanaguage Pathology Services    Follow Up Recommendations  None (pending progress)    Frequency and Duration min 1 x/week  1 week      SLP Evaluation Prior Functioning  Cognitive/Linguistic Baseline: Within functional limits Type of Home: House  Lives With: Alone Available Help at Discharge: Family Vocation: Retired   Associate Professor  Overall Cognitive Status: Within Functional Limits for tasks assessed (higher level cognition should be assessed in further detail) Arousal/Alertness: Awake/alert Orientation Level: Oriented X4 Attention: Sustained Sustained Attention: Appears intact Memory: Appears intact Awareness: Appears intact Problem Solving: Appears intact    Comprehension  Auditory Comprehension Overall Auditory Comprehension: Appears within functional limits for tasks assessed Reading Comprehension Reading Status: Within funtional limits    Expression Verbal Expression Overall Verbal Expression: Impaired Initiation: No impairment Level of Generative/Spontaneous Verbalization: Conversation Repetition: Impaired Level of Impairment: Sentence level Naming: No impairment Pragmatics: No impairment Effective Techniques:  (slowed rate) Written Expression Dominant Hand: Right Written Expression: Within Functional Limits   Oral / Motor  Oral Motor/Sensory Function Overall Oral Motor/Sensory Function: Within functional limits Motor Speech Overall Motor Speech: Appears within functional limits for tasks assessed Respiration: Within functional limits  Phonation: Normal Resonance: Within  functional limits Articulation: Within functional limitis Intelligibility: Intelligible Motor Planning: Impaired Level 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

## 2015-10-15 NOTE — Progress Notes (Signed)
PT Cancellation Note  Patient Details Name: Cortlan Cortes MRN: [id number] DOB: 1928-02-01   Cancelled Treatment:    Reason Eval/Treat Not Completed: Patient declined, no reason specified;Other (comment) (Pt fatigued and emotional and family requesting hold of further therapy evals today.  Will see 8/18 as able. 10/15/2015  Donnella Sham, [place] [phone]  (pager)   Quenesha Douglass, [name] 10/15/2015, 4:08 PM

## 2015-10-15 NOTE — Evaluation (Signed)
Clinical/Bedside Swallow Evaluation Patient Details  Name: [name] MRN: [id number] Date of Birth: 1927-08-21  Today's Date: 10/15/2015 Time: SLP Start Time (ACUTE ONLY): [id number] SLP Stop Time (ACUTE ONLY): 0954 SLP Time Calculation (min) (ACUTE ONLY): 49 min  Past Medical History:  Past Medical History:  Diagnosis Date  . Adenomatous polyp   . CAD (coronary artery disease)   . Chronic kidney disease   . Diverticulosis   . DVT (deep venous thrombosis) ([place])   . Gout   . History of pulmonary embolus (PE)    from a DVT in 2008  . Hyperglycemia   . Hyperlipidemia   . Hypertension   . Mallory-Weiss tear   . Peripheral vascular disease ([place])   . SVT (supraventricular tachycardia) ([place])   . Vitamin B12 deficiency    Past Surgical History:  Past Surgical History:  Procedure Laterality Date  .  PTCA of coronary lesion Right 1995  . APPENDECTOMY    . PR VEIN BYPASS GRAFT,AORTO-FEM-POP Left    femoral-popliteal BP by Dr. Drucie Opitz  . repair of aal fissure   1978   HPI:  Pt is an 82 y/o m admitted due to neuro changes including "stumbling over words". He has been diagnosed with paramedian pons stroke.   Assessment / Plan / Recommendation Clinical Impression  Pt seen for clinical assessment of swallow function after failing stroke swallow screen. Pt denied any difficulty swallowing. He tolerated all consistencies trialed WNL. No s/s aspiration noted. Recommend: Dys 3 diet with thins. Meats moistened with sauce or gravy due to dental pain with mastication. No further SLP followup for swallowing function needed.     Aspiration Risk  No limitations    Diet Recommendation Dysphagia 3 (Mech soft);Thin liquid   Liquid Administration via: Cup;Straw Medication Administration: Whole meds with liquid Supervision: Patient able to self feed Postural Changes: Seated upright at 90 degrees    Other  Recommendations Oral Care Recommendations: Oral care QID   Follow up Recommendations   (see  cognitive assessment)    Frequency and Duration            Prognosis        Swallow Study   General Date of Onset: 10/12/15 HPI: Pt is an 82 y/o m admitted due to neuro changes including "stumbling over words". He has been diagnosed with paramedian pons stroke. Type of Study: Bedside Swallow Evaluation Previous Swallow Assessment: failed stroke swallow screen Diet Prior to this Study: NPO Temperature Spikes Noted: No Respiratory Status: Room air History of Recent Intubation: No Behavior/Cognition: Alert;Cooperative;Pleasant mood Oral Cavity Assessment: Within Functional Limits Oral Care Completed by SLP: No Oral Cavity - Dentition: Adequate natural dentition (painful tooth on L lower- was due for an extraction) Vision: Functional for self-feeding Self-Feeding Abilities: Able to feed self Patient Positioning: Upright in bed Baseline Vocal Quality: Normal Volitional Cough: Strong Volitional Swallow: Able to elicit    Oral/Motor/Sensory Function Overall Oral Motor/Sensory Function: Within functional limits   Ice Chips Ice chips: Within functional limits Presentation: Spoon   Thin Liquid Thin Liquid: Within functional limits Presentation: Cup;Straw;Self Fed    Nectar Thick     Honey Thick     Puree Puree: Within functional limits Presentation: Self Fed;Spoon   Solid   GO   Solid: Within functional limits (except for careful mastication secondary to dental pain) Presentation: Self Fed    Functional Assessment Tool Used: clinician judgement Functional Limitations: Swallowing Swallow Current Status [id number]): At least 1 percent but less than  20 percent impaired, limited or restricted Swallow Goal Status [phone]): At least 1 percent but less than 20 percent impaired, limited or restricted Swallow Discharge Status [phone]): At least 1 percent but less than 20 percent impaired, limited or restricted   Vinetta Bergamo MA, [place] Pager [phone] 10/15/2015,12:41 PM

## 2015-10-16 ENCOUNTER — Inpatient Hospital Stay (HOSPITAL_COMMUNITY): Payer: Medicare Other

## 2015-10-16 DIAGNOSIS — I6350 Cerebral infarction due to unspecified occlusion or stenosis of unspecified cerebral artery: Secondary | ICD-10-CM

## 2015-10-16 DIAGNOSIS — E785 Hyperlipidemia, unspecified: Secondary | ICD-10-CM

## 2015-10-16 DIAGNOSIS — I10 Essential (primary) hypertension: Secondary | ICD-10-CM

## 2015-10-16 DIAGNOSIS — I951 Orthostatic hypotension: Secondary | ICD-10-CM

## 2015-10-16 DIAGNOSIS — R471 Dysarthria and anarthria: Secondary | ICD-10-CM

## 2015-10-16 LAB — GLUCOSE, CAPILLARY
GLUCOSE-CAPILLARY: 132 mg/dL — AB (ref 65–99)
GLUCOSE-CAPILLARY: 132 mg/dL — AB (ref 65–99)
GLUCOSE-CAPILLARY: 143 mg/dL — AB (ref 65–99)
GLUCOSE-CAPILLARY: 146 mg/dL — AB (ref 65–99)
Glucose-Capillary: 116 mg/dL — ABNORMAL HIGH (ref 65–99)
Glucose-Capillary: 118 mg/dL — ABNORMAL HIGH (ref 65–99)
Glucose-Capillary: 159 mg/dL — ABNORMAL HIGH (ref 65–99)

## 2015-10-16 MED ORDER — IOPAMIDOL (ISOVUE-370) INJECTION 76%
INTRAVENOUS | Status: AC
  Filled 2015-10-16: qty 50

## 2015-10-16 MED ORDER — IOPAMIDOL (ISOVUE-370) INJECTION 76%
50.0000 mL | Freq: Once | INTRAVENOUS | Status: AC | PRN
  Administered 2015-10-16: 50 mL via INTRAVENOUS

## 2015-10-16 MED ORDER — CLOPIDOGREL BISULFATE 75 MG PO TABS
75.0000 mg | ORAL_TABLET | Freq: Every day | ORAL | Status: DC
  Administered 2015-10-16 – 2015-10-21 (×6): 75 mg via ORAL
  Filled 2015-10-16 (×6): qty 1

## 2015-10-16 MED ORDER — LOSARTAN POTASSIUM 50 MG PO TABS
100.0000 mg | ORAL_TABLET | Freq: Every day | ORAL | Status: DC
  Administered 2015-10-17: 100 mg via ORAL
  Filled 2015-10-16: qty 2

## 2015-10-16 NOTE — Progress Notes (Signed)
PROGRESS NOTE    [name]  [id number] DOB: 1927/11/20 DOA: 10/14/2015 PCP: Mathews Argyle, MD   Brief Narrative:  [name] is a pleasant 82 year old gentleman with a past medical history of coronary artery disease, hypertension, chronic disease, diabetes mellitus admitted to medicine service on 10/14/2015 when he presented with complaints of slurred speech, workup included MRI of brain that revealed an acute infarct in the left pons.    Assessment & Plan:   Principal Problem:   CVA (cerebral vascular accident) [facility]) Active Problems:   Peripheral vascular disease, unspecified ([place])   CVA (cerebral infarction)   Accelerated hypertension   Type 2 diabetes mellitus ([place])  1.  Acute CVA -Samuel Reuther presenting with a 3 day history of dysarthria, workup included MRI of brain showing an acute CVA involving left pons. He was evaluated by neurology overnight. -Carotid Dopplers showed 1-39% stenosis involving right and left internal carotid arteries, transthoracic echocardiogram showing EF of 60-65% with grade 1 diastolic dysfunction -Continue antiplatelet therapy with aspirin -10/16/2015 patient reporting difficulty swallowing, having concerns for worsening focal deficits Code CVA was called as he underwent stat CT scan of head. Radiology did not report new intracranial abnormality. CTA showing the left vertebral artery severely atherosclerotic with moderate to severe irregularity and stenosis of the proximal basilar artery -Spoke with Neurology will treat with dual agent antiplatelet therapy with ASA and Plavix.    2.  Hypertension. -Patient presented with stroke, allowing for permissive hypertension, breaking blood pressures down slowly.  -Will increase Cozaar to 100 mg by mouth dail and continue metoprolol at 25 mg by mouth twice a day, as we work to gradually bring down blood pressures  3.  Chronic kidney disease  -Lab work showing creatinine of 1.5   DVT prophylaxis: subcutaneous  heparin  Code Status: Full Code  Family Communication: Spoke with his wife was present at bedside Disposition Plan: Plan for inpatient rehabilitation  Consultants:   Neurology   Procedures:   Transthoracic echocardiogram performed on 10/15/2015 Impression: - Left ventricle: The cavity size was normal. Systolic function was   normal. The estimated ejection fraction was in the range of 60%   to 65%. Wall motion was normal; there were no regional wall   motion abnormalities. Doppler parameters are consistent with   abnormal left ventricular relaxation (grade 1 diastolic   dysfunction). Doppler parameters are consistent with   indeterminate ventricular filling pressure.  Subjective: Patient reporting difficulty swallowing this morning, he is concerned about stroke symptoms  Objective: Vitals:   10/15/15 2218 10/16/15 0030 10/16/15 0437 10/16/15 1021  BP: (!) 213/81 (!) 190/92 (!) 202/73 (!) 171/70  Pulse: 62  62 68  Resp: 17  17 18   Temp: 97.7 F (36.5 C)  97.6 F (36.4 C) 97.5 F (36.4 C)  TempSrc: Oral  Oral Oral  SpO2: 96%  96% 93%  Weight:      Height:        Intake/Output Summary (Last 24 hours) at 10/16/15 1446 Last data filed at 10/16/15 1300  Gross per 24 hour  Intake              500 ml  Output                0 ml  Net              500 ml   Filed Weights   10/14/15 1233  Weight: 80.4 kg (177 lb 3.2 oz)    Examination:  General exam:  In distress, tearful   Respiratory system: Clear to auscultation. Respiratory effort normal. Cardiovascular system: S1 & S2 heard, RRR. No JVD, murmurs, rubs, gallops or clicks. No pedal edema. Gastrointestinal system: Abdomen is nondistended, soft and nontender. No organomegaly or masses felt. Normal bowel sounds heard. Central nervous system: There is mild right sided facial droop, along with 4/5 muscle strength to right upper and right lower extremities.  Extremities: Symmetric 5 x 5 power. Skin: No rashes, lesions or  ulcers Psychiatry: has mild dysarthria  Data Reviewed: I have personally reviewed following labs and imaging studies  CBC:  Recent Labs Lab 10/14/15 1255 10/14/15 1307  WBC 8.6  --   NEUTROABS 5.8  --   HGB 17.1* 17.7*  HCT 50.3 52.0  MCV 89.0  --   PLT 187  --    Basic Metabolic Panel:  Recent Labs Lab 10/14/15 1255 10/14/15 1307  NA 138 140  K 4.5 4.6  CL 104 102  CO2 27  --   GLUCOSE 127* 124*  BUN 25* 27*  CREATININE 1.45* 1.50*  CALCIUM 9.8  --    GFR: Estimated Creatinine Clearance: 35.9 mL/min (by C-G formula based on SCr of 1.5 mg/dL). Liver Function Tests:  Recent Labs Lab 10/14/15 1255  AST 71*  ALT 103*  ALKPHOS 53  BILITOT 0.7  PROT 8.4*  ALBUMIN 4.8   No results for input(s): LIPASE, AMYLASE in the last 168 hours. No results for input(s): AMMONIA in the last 168 hours. Coagulation Profile:  Recent Labs Lab 10/14/15 1255  INR 1.01   Cardiac Enzymes: No results for input(s): CKTOTAL, CKMB, CKMBINDEX, TROPONINI in the last 168 hours. BNP (last 3 results) No results for input(s): PROBNP in the last 8760 hours. HbA1C: No results for input(s): HGBA1C in the last 72 hours. CBG:  Recent Labs Lab 10/15/15 2010 10/16/15 0027 10/16/15 0436 10/16/15 0749 10/16/15 1225  GLUCAP 195* 132* 116* 159* 143*   Lipid Profile: No results for input(s): CHOL, HDL, LDLCALC, TRIG, CHOLHDL, LDLDIRECT in the last 72 hours. Thyroid Function Tests: No results for input(s): TSH, T4TOTAL, FREET4, T3FREE, THYROIDAB in the last 72 hours. Anemia Panel: No results for input(s): VITAMINB12, FOLATE, FERRITIN, TIBC, IRON, RETICCTPCT in the last 72 hours. Sepsis Labs: No results for input(s): PROCALCITON, LATICACIDVEN in the last 168 hours.  No results found for this or any previous visit (from the past 240 hour(s)).       Radiology Studies: Ct Angio Head W Or Wo Contrast  Addendum Date: 10/16/2015   ADDENDUM REPORT: 10/16/2015 10:20 ADDENDUM: Study  discussed by telephone with Dr. Roland Rack on 10/16/2015 At 1014 hours. We also discussed the bulky soft plaque or mural thrombus in the distal arch, which is not strongly suspected to be a factor in the intracranial ischemia given noted is distal to the left subclavian origin. Electronically Signed   By: Genevie Ann M.D.   On: 10/16/2015 10:20   Result Date: 10/16/2015 CLINICAL DATA:  82 year old male with right side weakness aphasia and trouble swallowing. Initial encounter. Left paracentral brainstem infarct on recent MRI. EXAM: CT ANGIOGRAPHY HEAD AND NECK TECHNIQUE: Multidetector CT imaging of the head and neck was performed using the standard protocol during bolus administration of intravenous contrast. Multiplanar CT image reconstructions and MIPs were obtained to evaluate the vascular anatomy. Carotid stenosis measurements (when applicable) are obtained utilizing NASCET criteria, using the distal internal carotid diameter as the denominator. CONTRAST:  50 mL Isovue 370 COMPARISON:  Head CT without  contrast 912 hours today. Brain MRI and intracranial MRA 10/14/2015. FINDINGS: CTA NECK Skeleton: No acute osseous abnormality identified. Fairly advanced cervical spine degeneration. Visualized paranasal sinuses and mastoids are stable and well pneumatized. Other neck: Mild upper lung atelectasis. No superior mediastinal lymphadenopathy. Negative thyroid, larynx, pharynx, parapharyngeal spaces, retropharyngeal space, sublingual space, submandibular glands and parotid glands. No cervical lymphadenopathy. Aortic arch: 4 vessel arch configuration, the left vertebral artery arises directly from the arch. Bulky soft plaque or mural thrombus in the distal arch, primarily distal to the left subclavian origin. Superimposed more ordinary soft and calcified arch atherosclerosis. Right carotid system: Circumferential soft plaque in the right CCA, but no hemodynamically significant stenosis proximal to the right carotid  bifurcation. At the bifurcation right ICA origins stenosis up to 55% occurs. Cervical right ICA otherwise is negative. Left carotid system: No left CCA origin stenosis despite soft and calcified plaque. Circumferential soft plaque in the left CCA, including some low-density plaque in the medial vessel at the level of the a hyoid (series 701, image 65). Soft and calcified plaque at the left carotid bifurcation without stenosis. Circumferential soft plaque been affects the left ICA distal to the bulb, but no hemodynamically significant stenosis occurs. Vertebral arteries:Right subclavian artery origin fifty % stenosis with respect to the distal vessel related to soft plaque. Calcified plaque at the right vertebral artery origin with mild to moderate stenosis. Calcified plaque in the right V1 segment without stenosis. No other right vertebral artery stenosis to the skullbase. The left vertebral artery arises directly from the arch, but its origin and proximal segment are highly stenotic. The V1 segment is diminutive and highly irregular. The left V2 segment is slightly better enhancing. There are tandem moderate to severe stenoses in the left V3 segment, and where the vessel crosses the dura. See intracranial findings below. CTA HEAD Posterior circulation: The dominant distal right vertebral artery demonstrates calcified plaque at the skullbase not resulting in significant stenosis. The right PICA origin is patent. The right vertebral artery supplies the basilar. The distal left vertebral artery is occluded from the C1 level to the left PICA origin. The vessel is poorly reconstituted from the left PICA to the vertebrobasilar junction, with another short segmental occlusion. There is moderate to severe irregularity and stenosis of the proximal basilar artery between the vertebrobasilar junction and right IAC origin as seen on series 703, image 115. This appears more pronounced than on the intracranial MRA yesterday.  Basilar remains patent. Posterior communicating arteries are diminutive or absent. SCA and PCA origins are patent although there is bilateral PCA origin moderate to severe stenosis. The left PCA branches then are within normal limits. There is AA severe right PCA P2 segment stenosis beyond which distal right PCA branches are poorly enhancing. This is stable to the MRA finding yesterday. Anterior circulation: Both ICA siphons are patent although there is moderate to severe siphon soft and calcified plaque. There is severe stenosis at the right anterior genu related the calcified plaque. There is moderate to severe stenosis in the left cavernous segment and in the proximal left supraclinoid segment related to soft and calcified plaque. Both carotid termini remain patent. Both MCA and ACA origins are normal. Anterior communicating artery and bilateral ACA branches are within normal limits. Left MCA M1 segment, bifurcation, and left MCA branches are within normal limits. Right MCA M1 segment, trifurcation, and right proximal right MCA branches are within normal limits. There is some distal right M2 irregularity and stenosis in the  posterior division (series 706, image 20). Venous sinuses: Patent. Anatomic variants: The left vertebral artery arises directly from the arch. Dominant right vertebral artery. IMPRESSION: 1. Negative for intracranial emergent large vessel occlusion. However, the left vertebral artery is severely atherosclerotic throughout its course and is intermittently occluded in the left V4 segment. And there is moderate to severe irregularity and stenosis of the proximal basilar artery which appears somewhat more pronounced than on the MRA yesterday. 2. Additionally there is moderate to severe intracranial atherosclerosis of the bilateral ICA siphons and right PCA P2 segment. 3. Abundant soft plaque in both cervical carotid arteries, with some areas of low-density soft plaque noted. Stenosis up to 55%  occurs. 4. Mild to moderate stenosis of the origin of the dominant right vertebral artery. Electronically Signed: By: Genevie Ann M.D. On: 10/16/2015 10:12   Ct Angio Neck W Or Wo Contrast  Addendum Date: 10/16/2015   ADDENDUM REPORT: 10/16/2015 10:20 ADDENDUM: Study discussed by telephone with Dr. Roland Rack on 10/16/2015 At 1014 hours. We also discussed the bulky soft plaque or mural thrombus in the distal arch, which is not strongly suspected to be a factor in the intracranial ischemia given noted is distal to the left subclavian origin. Electronically Signed   By: Genevie Ann M.D.   On: 10/16/2015 10:20   Result Date: 10/16/2015 CLINICAL DATA:  82 year old male with right side weakness aphasia and trouble swallowing. Initial encounter. Left paracentral brainstem infarct on recent MRI. EXAM: CT ANGIOGRAPHY HEAD AND NECK TECHNIQUE: Multidetector CT imaging of the head and neck was performed using the standard protocol during bolus administration of intravenous contrast. Multiplanar CT image reconstructions and MIPs were obtained to evaluate the vascular anatomy. Carotid stenosis measurements (when applicable) are obtained utilizing NASCET criteria, using the distal internal carotid diameter as the denominator. CONTRAST:  50 mL Isovue 370 COMPARISON:  Head CT without contrast 912 hours today. Brain MRI and intracranial MRA 10/14/2015. FINDINGS: CTA NECK Skeleton: No acute osseous abnormality identified. Fairly advanced cervical spine degeneration. Visualized paranasal sinuses and mastoids are stable and well pneumatized. Other neck: Mild upper lung atelectasis. No superior mediastinal lymphadenopathy. Negative thyroid, larynx, pharynx, parapharyngeal spaces, retropharyngeal space, sublingual space, submandibular glands and parotid glands. No cervical lymphadenopathy. Aortic arch: 4 vessel arch configuration, the left vertebral artery arises directly from the arch. Bulky soft plaque or mural thrombus in the  distal arch, primarily distal to the left subclavian origin. Superimposed more ordinary soft and calcified arch atherosclerosis. Right carotid system: Circumferential soft plaque in the right CCA, but no hemodynamically significant stenosis proximal to the right carotid bifurcation. At the bifurcation right ICA origins stenosis up to 55% occurs. Cervical right ICA otherwise is negative. Left carotid system: No left CCA origin stenosis despite soft and calcified plaque. Circumferential soft plaque in the left CCA, including some low-density plaque in the medial vessel at the level of the a hyoid (series 701, image 65). Soft and calcified plaque at the left carotid bifurcation without stenosis. Circumferential soft plaque been affects the left ICA distal to the bulb, but no hemodynamically significant stenosis occurs. Vertebral arteries:Right subclavian artery origin fifty % stenosis with respect to the distal vessel related to soft plaque. Calcified plaque at the right vertebral artery origin with mild to moderate stenosis. Calcified plaque in the right V1 segment without stenosis. No other right vertebral artery stenosis to the skullbase. The left vertebral artery arises directly from the arch, but its origin and proximal segment are highly stenotic. The  V1 segment is diminutive and highly irregular. The left V2 segment is slightly better enhancing. There are tandem moderate to severe stenoses in the left V3 segment, and where the vessel crosses the dura. See intracranial findings below. CTA HEAD Posterior circulation: The dominant distal right vertebral artery demonstrates calcified plaque at the skullbase not resulting in significant stenosis. The right PICA origin is patent. The right vertebral artery supplies the basilar. The distal left vertebral artery is occluded from the C1 level to the left PICA origin. The vessel is poorly reconstituted from the left PICA to the vertebrobasilar junction, with another short  segmental occlusion. There is moderate to severe irregularity and stenosis of the proximal basilar artery between the vertebrobasilar junction and right IAC origin as seen on series 703, image 115. This appears more pronounced than on the intracranial MRA yesterday. Basilar remains patent. Posterior communicating arteries are diminutive or absent. SCA and PCA origins are patent although there is bilateral PCA origin moderate to severe stenosis. The left PCA branches then are within normal limits. There is AA severe right PCA P2 segment stenosis beyond which distal right PCA branches are poorly enhancing. This is stable to the MRA finding yesterday. Anterior circulation: Both ICA siphons are patent although there is moderate to severe siphon soft and calcified plaque. There is severe stenosis at the right anterior genu related the calcified plaque. There is moderate to severe stenosis in the left cavernous segment and in the proximal left supraclinoid segment related to soft and calcified plaque. Both carotid termini remain patent. Both MCA and ACA origins are normal. Anterior communicating artery and bilateral ACA branches are within normal limits. Left MCA M1 segment, bifurcation, and left MCA branches are within normal limits. Right MCA M1 segment, trifurcation, and right proximal right MCA branches are within normal limits. There is some distal right M2 irregularity and stenosis in the posterior division (series 706, image 20). Venous sinuses: Patent. Anatomic variants: The left vertebral artery arises directly from the arch. Dominant right vertebral artery. IMPRESSION: 1. Negative for intracranial emergent large vessel occlusion. However, the left vertebral artery is severely atherosclerotic throughout its course and is intermittently occluded in the left V4 segment. And there is moderate to severe irregularity and stenosis of the proximal basilar artery which appears somewhat more pronounced than on the MRA  yesterday. 2. Additionally there is moderate to severe intracranial atherosclerosis of the bilateral ICA siphons and right PCA P2 segment. 3. Abundant soft plaque in both cervical carotid arteries, with some areas of low-density soft plaque noted. Stenosis up to 55% occurs. 4. Mild to moderate stenosis of the origin of the dominant right vertebral artery. Electronically Signed: By: Genevie Ann M.D. On: 10/16/2015 10:12   Samuel Brain Wo Contrast  Result Date: 10/14/2015 CLINICAL DATA:  Expressive aphasia. EXAM: MRI HEAD WITHOUT CONTRAST TECHNIQUE: Multiplanar, multiecho pulse sequences of the brain and surrounding structures were obtained without intravenous contrast. COMPARISON:  None. FINDINGS: Acute infarct left pons, moderate in size.  No other acute infarct Mild chronic microvascular ischemic changes in the white matter. Moderate atrophy. Negative for mass or edema.  No shift of the midline structures Pituitary normal in size. Normal orbital contents. Bilateral cataract extraction. Mucosal edema right maxillary sinus.  Remaining sinuses clear. IMPRESSION: Acute infarct in the left pons. Generalized atrophy with mild chronic microvascular ischemia in the white matter. Electronically Signed   By: Franchot Gallo M.D.   On: 10/14/2015 15:02   Samuel Jodene Nam Head/brain Wo Cm  Result Date: 10/14/2015 CLINICAL DATA:  Expressive aphasia EXAM: MRA HEAD WITHOUT CONTRAST TECHNIQUE: Angiographic images of the Circle of Willis were obtained using MRA technique without intravenous contrast. COMPARISON:  Same day brain MRI FINDINGS: Intracranial internal carotid arteries: There is moderate to severe narrowing of the intracranial left internal carotid artery, worst at the cavernous and clinoid segments. There is multifocal moderate atherosclerotic narrowing of the intracranial right ICA. Anterior cerebral arteries: Normal. Middle cerebral arteries: Possible 1.5 cm aneurysm versus atherosclerotic irregularity at the left P-comm origin.  Otherwise normal. Posterior communicating arteries: Absent bilaterally Posterior cerebral arteries: Normal. Basilar artery: There is moderate narrowing of the midportion of the basilar artery. Vertebral arteries: Right-dominant. Minimal flow related enhancement seen within the proximal V4 segment of the left vertebral artery. Superior cerebellar arteries: Normal. Anterior inferior cerebellar arteries: Normal. Posterior inferior cerebellar arteries: Normal. IMPRESSION: 1. Moderate narrowing of the midportion of the basilar artery, likely secondary to atherosclerotic disease. 2. Multifocal moderate to severe narrowing of the intracranial internal carotid arteries, left worse than right. 3. Possible 1.5 mm aneurysm versus atherosclerotic irregularity at the left posterior communicating artery origin. Electronically Signed   By: Ulyses Jarred M.D.   On: 10/14/2015 20:28   Ct Head Code Stroke Wo Contrast  Addendum Date: 10/16/2015   ADDENDUM REPORT: 10/16/2015 09:30 ADDENDUM: Study discussed by telephone with Dr. Roland Rack on 10/16/2015 At 0926 hours. Electronically Signed   By: Genevie Ann M.D.   On: 10/16/2015 09:30   Result Date: 10/16/2015 CLINICAL DATA:  Code stroke. 82 year old male with right side weakness aphasia and trouble swallowing. Initial encounter. Left paracentral brainstem infarct on recent MRI. EXAM: CT HEAD WITHOUT CONTRAST TECHNIQUE: Contiguous axial images were obtained from the base of the skull through the vertex without intravenous contrast. COMPARISON:  Brain MRI and intracranial MRA 10/14/2015 FINDINGS: Visualized paranasal sinuses and mastoids are stable and well pneumatized. No acute osseous abnormality identified. No acute orbit or scalp soft tissue findings. Extensive Calcified atherosclerosis at the skull base. Mild hypodensity corresponding to the left paracentral pontine infarct seen by MRI. No associated hemorrhage or mass effect. No superimposed acute cortically based infarct  identified. No acute intracranial hemorrhage identified. ASPECTS [facility] Stroke Program Early CT Score) Total score (0-10 with 10 being normal): 10. Stable cerebral volume. No ventriculomegaly. Basilar cisterns remain patent. IMPRESSION: 1. Expected CT appearance of the left paracentral brainstem infarct seen by MRI 2 days ago. No associated hemorrhage or mass effect. 2. No superimposed acute cortically based infarct or new intracranial abnormality. 3. ASPECTS is 10. Electronically Signed: By: Genevie Ann M.D. On: 10/16/2015 09:23        Scheduled Meds: . amoxicillin  500 mg Oral Q8H  . aspirin  300 mg Rectal Daily   Or  . aspirin  325 mg Oral Daily  . heparin  5,000 Units Subcutaneous Q8H  . insulin aspart  0-9 Units Subcutaneous Q4H  . iopamidol      . losartan  50 mg Oral Daily  . metoprolol tartrate  25 mg Oral BID  . pravastatin  20 mg Oral q1800   Continuous Infusions:    LOS: 1 day    Time spent: 35 min    Kelvin Cellar, MD [facility] Pager [phone]  If 7PM-7AM, please contact night-coverage [email or web address] Password [facility] 10/16/2015, 2:46 PM

## 2015-10-16 NOTE — Consult Note (Signed)
Physical Medicine and Rehabilitation Consult  Reason for Consult: Slurred speech and difficulty talking.   Referring Physician: Dr. Coralyn Pear   HPI: [name] is a 82 y.o. male with history of CAD, SVT, CKD, T2DM PVD with RLE claudication who was admitted on 10/14/15 with difficulty talking, difficulty moving RLE and difficulty with RUE coordination.  Patient had stopped taking his ASA 8/13 in anticipation of upcoming dental procedure. UDS negative  MRI brain done revealing acute left pontine infarct, generalized atrophy, moderate narrowing midportion of basilar artery and multifocal moderate to severe narrowing of intracranial internal carotid arteries L > R.Carotid dopplers without significant stenosis.  Cardiac echo with EF [id number], normal systolic function and mild MVR/AVR.  [name] recommended increasing ASA 325 mg with plavix X 3 months followed by plavix alone due to BA stenosis. BP goal 130 -150 due to BA stenosis. OT evaluations done yesterday and CIR recommended for balance deficits. .   Review of Systems  Constitutional: Negative for fever.  Eyes: Positive for blurred vision.  Respiratory: Negative for cough.   Cardiovascular: Negative for chest pain.  Gastrointestinal: Negative.   Genitourinary: Negative.   Musculoskeletal: Positive for joint pain. Negative for falls, myalgias and neck pain.  Skin: Negative for rash.  Neurological: Positive for dizziness and focal weakness. Negative for headaches.  Endo/Heme/Allergies: Negative.   Psychiatric/Behavioral: Negative.   All other systems reviewed and are negative.     Past Medical History:  Diagnosis Date  . Adenomatous polyp   . CAD (coronary artery disease)   . Chronic kidney disease   . Diverticulosis   . DVT (deep venous thrombosis) ([place])   . Gout   . History of pulmonary embolus (PE)    from a DVT in 2008  . Hyperglycemia   . Hyperlipidemia   . Hypertension   . Mallory-Weiss tear   . Peripheral vascular  disease ([place])   . SVT (supraventricular tachycardia) ([place])   . Vitamin B12 deficiency     Past Surgical History:  Procedure Laterality Date  .  PTCA of coronary lesion Right 1995  . APPENDECTOMY    . PR VEIN BYPASS GRAFT,AORTO-FEM-POP Left    femoral-popliteal BP by Dr. Drucie Opitz  . repair of aal fissure   1978    Family History  Problem Relation Age of Onset  . Cancer Mother     bladder  . CAD Father   . Heart attack Father     Social History:  Widowed. Retired Air traffic controller.  He drives and babysit's his granddaughter daily. Per reports that he has never smoked. He has never used smokeless tobacco. He reports that he does not drink alcohol or use drugs.    Allergies  Allergen Reactions  . Lipitor [Atorvastatin]     Muscle pain   . Zetia [Ezetimibe] Hives    Medications Prior to Admission  Medication Sig Dispense Refill  . amoxicillin (AMOXIL) 500 MG capsule Take 500 mg by mouth every 6 (six) hours.     Marland Kitchen aspirin 81 MG tablet Take 81 mg by mouth daily.    Marland Kitchen losartan (COZAAR) 50 MG tablet Take 50 mg by mouth daily.    . metoprolol tartrate (LOPRESSOR) 25 MG tablet Take 25 mg by mouth 2 (two) times daily.      Home: Home Living Family/patient expects to be discharged to:: Private residence Living Arrangements: Alone Available Help at Discharge: Family Type of Home: House Home Equipment: Gilford Rile - 2 wheels Additional Comments:  Unable to obtain as pt was very emotional and crying when asked about home  Lives With: Alone  Functional History: Prior Function Level of Independence: Independent Functional Status:  Mobility: Bed Mobility Overal bed mobility: Needs Assistance Bed Mobility: Supine to Sit, Sit to Supine Supine to sit: Min assist Sit to supine: Min assist General bed mobility comments: Min assist for trunk support to come to sitting. Assist to reposition in bed as well.  Transfers Overall transfer level: Needs assistance Equipment used:  Rolling walker (2 wheeled), None Transfers: Sit to/from Stand Sit to Stand: Min assist General transfer comment: Min assist for boost to stand to stabilize balance upon standing. Initial sit-stand pt attempted to stand without RW and was unable to take steps without UE support from therapist. Pt performed much better with RW and required min guard assist for short-distance ambulation.      ADL:    Cognition: Cognition Overall Cognitive Status: Difficult to assess Arousal/Alertness: Awake/alert Orientation Level: Oriented X4 Attention: Sustained Sustained Attention: Appears intact Memory: Appears intact Awareness: Appears intact Problem Solving: Appears intact Cognition Arousal/Alertness: Awake/alert Behavior During Therapy:  (emotionally labile) Overall Cognitive Status: Difficult to assess Difficult to assess due to:  (Pt emotional and crying throughout session)  Blood pressure (!) 202/73, pulse 62, temperature 97.6 F (36.4 C), temperature source Oral, resp. rate 17, height 5\' 7"  (1.702 m), weight 80.4 kg (177 lb 3.2 oz), SpO2 96 %. Physical Exam  Constitutional: He appears well-developed.  HENT:  Head: Normocephalic.  Eyes: Pupils are equal, round, and reactive to light.  Neck: Normal range of motion.  Cardiovascular: Normal rate.   Respiratory: Effort normal.  GI: Soft.  Musculoskeletal: Normal range of motion.  Neurological:  Speech slurred. Has word finding deficits and delays with processing. RUE 4 to 4+/5 with decreased [place]. RLE 4+/5. No gross sensory abnl.   Psychiatric: He has a normal mood and affect. His behavior is normal.    Results for orders placed or performed during the hospital encounter of 10/14/15 (from the past 24 hour(s))  Glucose, capillary     Status: Abnormal   Collection Time: 10/15/15 12:40 PM  Result Value Ref Range   Glucose-Capillary 160 (H) 65 - 99 mg/dL  Glucose, capillary     Status: Abnormal   Collection Time: 10/15/15  5:07 PM    Result Value Ref Range   Glucose-Capillary 148 (H) 65 - 99 mg/dL  Glucose, capillary     Status: Abnormal   Collection Time: 10/15/15  8:10 PM  Result Value Ref Range   Glucose-Capillary 195 (H) 65 - 99 mg/dL  Glucose, capillary     Status: Abnormal   Collection Time: 10/16/15 12:27 AM  Result Value Ref Range   Glucose-Capillary 132 (H) 65 - 99 mg/dL  Glucose, capillary     Status: Abnormal   Collection Time: 10/16/15  4:36 AM  Result Value Ref Range   Glucose-Capillary 116 (H) 65 - 99 mg/dL  Glucose, capillary     Status: Abnormal   Collection Time: 10/16/15  7:49 AM  Result Value Ref Range   Glucose-Capillary 159 (H) 65 - 99 mg/dL   Mr Brain Wo Contrast  Result Date: 10/14/2015 CLINICAL DATA:  Expressive aphasia. EXAM: MRI HEAD WITHOUT CONTRAST TECHNIQUE: Multiplanar, multiecho pulse sequences of the brain and surrounding structures were obtained without intravenous contrast. COMPARISON:  None. FINDINGS: Acute infarct left pons, moderate in size.  No other acute infarct Mild chronic microvascular ischemic changes in the white matter. Moderate  atrophy. Negative for mass or edema.  No shift of the midline structures Pituitary normal in size. Normal orbital contents. Bilateral cataract extraction. Mucosal edema right maxillary sinus.  Remaining sinuses clear. IMPRESSION: Acute infarct in the left pons. Generalized atrophy with mild chronic microvascular ischemia in the white matter. Electronically Signed   By: Franchot Gallo M.D.   On: 10/14/2015 15:02   Mr Jodene Nam Head/brain [id number] Cm  Result Date: 10/14/2015 CLINICAL DATA:  Expressive aphasia EXAM: MRA HEAD WITHOUT CONTRAST TECHNIQUE: Angiographic images of the Circle of Willis were obtained using MRA technique without intravenous contrast. COMPARISON:  Same day brain MRI FINDINGS: Intracranial internal carotid arteries: There is moderate to severe narrowing of the intracranial left internal carotid artery, worst at the cavernous and clinoid  segments. There is multifocal moderate atherosclerotic narrowing of the intracranial right ICA. Anterior cerebral arteries: Normal. Middle cerebral arteries: Possible 1.5 cm aneurysm versus atherosclerotic irregularity at the left P-comm origin. Otherwise normal. Posterior communicating arteries: Absent bilaterally Posterior cerebral arteries: Normal. Basilar artery: There is moderate narrowing of the midportion of the basilar artery. Vertebral arteries: Right-dominant. Minimal flow related enhancement seen within the proximal V4 segment of the left vertebral artery. Superior cerebellar arteries: Normal. Anterior inferior cerebellar arteries: Normal. Posterior inferior cerebellar arteries: Normal. IMPRESSION: 1. Moderate narrowing of the midportion of the basilar artery, likely secondary to atherosclerotic disease. 2. Multifocal moderate to severe narrowing of the intracranial internal carotid arteries, left worse than right. 3. Possible 1.5 mm aneurysm versus atherosclerotic irregularity at the left posterior communicating artery origin. Electronically Signed   By: Ulyses Jarred M.D.   On: 10/14/2015 20:28    Assessment/Plan: Diagnosis: left pontine infarct with right hemiparesis dysarthria/dysphagia/processing delays 1. Does the need for close, 24 hr/day medical supervision in concert with the patient's rehab needs make it unreasonable for this patient to be served in a less intensive setting? Yes 2. Co-Morbidities requiring supervision/potential complications: HTn, PVD, DM2 3. Due to bladder management, bowel management, safety, skin/wound care, disease management, medication administration, pain management and patient education, does the patient require 24 hr/day rehab nursing? Yes 4. Does the patient require coordinated care of a physician, rehab nurse, PT (1-2 hrs/day, 5 days/week), OT (1-2 hrs/day, 5 days/week) and SLP (1-2 hrs/day, 5 days/week) to address physical and functional deficits in the  context of the above medical diagnosis(es)? Yes Addressing deficits in the following areas: balance, endurance, locomotion, strength, transferring, bowel/bladder control, bathing, dressing, feeding, grooming, toileting, cognition, speech, language, swallowing and psychosocial support 5. Can the patient actively participate in an intensive therapy program of at least 3 hrs of therapy per day at least 5 days per week? Yes 6. The potential for patient to make measurable gains while on inpatient rehab is excellent 7. Anticipated functional outcomes upon discharge from inpatient rehab are modified independent  with PT, modified independent with OT, modified independent and supervision with SLP. 8. Estimated rehab length of stay to reach the above functional goals is: 7-10 days 9. Does the patient have adequate social supports and living environment to accommodate these discharge functional goals? Yes 10. Anticipated D/C setting: Home 11. Anticipated post D/C treatments: HH therapy and Outpatient therapy 12. Overall Rehab/Functional Prognosis: excellent  RECOMMENDATIONS: This patient's condition is appropriate for continued rehabilitative care in the following setting: CIR Patient has agreed to participate in recommended program. Yes Note that insurance prior authorization may be required for reimbursement for recommended care.  Comment: Rehab Admissions Coordinator to follow up.  Thanks,  Alroy Dust  Alen Blew, Samuel, Mellody Drown     10/16/2015

## 2015-10-16 NOTE — Progress Notes (Signed)
STROKE TEAM PROGRESS NOTE   SUBJECTIVE (INTERVAL HISTORY) Daughter is at bedside. This am he reports difficulty getting food down, solids worse than liquids. He first noticed difficulty when he got OOB to go to the bathroom, feeling left ear numbness. Patient feels he is having difficulty getting words out, difficulty using right hand for breakfast. He was dizzy. During rounds, he appeared anxious, tearful. Michela Pitcher he was worried.    OBJECTIVE Temp:  [97.4 F (36.3 C)-100 F (37.8 C)] 97.6 F (36.4 C) (08/18 0437) Pulse Rate:  [62-87] 62 (08/18 0437) Cardiac Rhythm: Normal sinus rhythm (08/18 0700) Resp:  [17-19] 17 (08/18 0437) BP: (151-213)/(68-92) 202/73 (08/18 0437) SpO2:  [94 %-96 %] 96 % (08/18 0437)  CBC:   Recent Labs Lab 10/14/15 1255 10/14/15 1307  WBC 8.6  --   NEUTROABS 5.8  --   HGB 17.1* 17.7*  HCT 50.3 52.0  MCV 89.0  --   PLT 187  --     Basic Metabolic Panel:   Recent Labs Lab 10/14/15 1255 10/14/15 1307  NA 138 140  K 4.5 4.6  CL 104 102  CO2 27  --   GLUCOSE 127* 124*  BUN 25* 27*  CREATININE 1.45* 1.50*  CALCIUM 9.8  --     Lipid Panel: No results found for: CHOL, TRIG, HDL, CHOLHDL, VLDL, LDLCALC HgbA1c: No results found for: HGBA1C Urine Drug Screen:     Component Value Date/Time   LABOPIA NONE DETECTED 10/14/2015 1250   COCAINSCRNUR NONE DETECTED 10/14/2015 1250   LABBENZ NONE DETECTED 10/14/2015 1250   AMPHETMU NONE DETECTED 10/14/2015 1250   THCU NONE DETECTED 10/14/2015 1250   LABBARB NONE DETECTED 10/14/2015 1250      IMAGING I have personally reviewed the radiological images below and agree with the radiology interpretations.  Mr Brain Wo Contrast 10/14/2015 Acute infarct in the left pons. Generalized atrophy with mild chronic microvascular ischemia in the white matter.   Mr Jodene Nam Head/brain Wo Cm 10/14/2015 1. Moderate narrowing of the midportion of the basilar artery, likely secondary to atherosclerotic disease. 2.  Multifocal moderate to severe narrowing of the intracranial internal carotid arteries, left worse than right. 3. Possible 1.5 mm aneurysm versus atherosclerotic irregularity at the left posterior communicating artery origin.   Carotid Doppler   There is 1-39% bilateral ICA stenosis. Vertebral artery flow is antegrade.    TTE - Left ventricle: The cavity size was normal. Systolic function was normal. The estimated ejection fraction was in the range of 60% to 65%. Wall motion was normal; there were no regional wall motionbnormalities. Doppler parameters are consistent with abnormal left ventricular relaxation (grade 1 diastolic dysfunction). Doppler parameters are consistent with indeterminate ventricular filling pressure. - Aortic valve: Transvalvular velocity was within the normal range. There was no stenosis. There was mild regurgitation. - Mitral valve: Transvalvular velocity was within the normal range. There was no evidence for stenosis. There was mild regurgitation. - Right ventricle: The cavity size was normal. Wall thickness was normal. Systolic function was normal. - Atrial septum: No defect or patent foramen ovale was identified by color flow Doppler. - Tricuspid valve: There was no regurgitation.  Ct Head Code Stroke Wo Contrast 10/16/2015 1. Expected CT appearance of the left paracentral brainstem infarct seen by MRI 2 days ago. No associated hemorrhage or mass effect. 2. No superimposed acute cortically based infarct or new intracranial abnormality. 3. ASPECTS is 10.   CT angio Head and Neck 10/16/2015 1. Negative for intracranial emergent large  vessel occlusion. However, the left vertebral artery is severely atherosclerotic throughout its course and is intermittently occluded in the left V4 segment. And there is moderate to severe irregularity and stenosis of the proximal basilar artery which appears somewhat more pronounced than on the MRA yesterday. 2. Additionally there is moderate  to severe intracranial atherosclerosis of the bilateral ICA siphons and right PCA P2 segment. 3. Abundant soft plaque in both cervical carotid arteries, with some areas of low-density soft plaque noted. Stenosis up to 55% occurs. 4. Mild to moderate stenosis of the origin of the dominant right vertebral artery.    PHYSICAL EXAM  Temp:  [97.4 F (36.3 C)-100 F (37.8 C)] 97.6 F (36.4 C) (08/18 0437) Pulse Rate:  [62-87] 62 (08/18 0437) Resp:  [17-19] 17 (08/18 0437) BP: (151-213)/(68-92) 202/73 (08/18 0437) SpO2:  [94 %-96 %] 96 % (08/18 0437)  General - Well nourished, well developed, in no apparent distress.  Ophthalmologic - fundi not visualized due to noncooperation  Cardiovascular - Regular rate and rhythm.  Mental Status -  Level of arousal and orientation to time, place, and person were intact. Anxious and tearful. Language including expression, naming, repetition, comprehension was assessed and found intact. Fund of Knowledge was assessed and was intact.  Cranial Nerves II - XII - II - Visual field intact OU. III, IV, VI - Extraocular movements intact. V - Facial sensation intact bilaterally. VII - right facial droop improved. VIII - Hearing & vestibular intact bilaterally. X - Palate elevates symmetrically. XI - Chin turning & shoulder shrug intact bilaterally XII - Tongue protrusion intact.  Motor Strength - The patient's strength was normal in all extremities except subtle right dexterity difficulty and RLE 5-/5 proximal and pronator drift was absent.  Bulk was normal and fasciculations were absent.   Motor Tone - Muscle tone was assessed at the neck and appendages and was normal.  Reflexes - The patient's reflexes were 1+ in all extremities and he had no pathological reflexes.  Sensory - Light touch, temperature/pinprick were assessed and were symmetrical.    Coordination - The patient had normal movements in the hands and feet with no ataxia or dysmetria.   Tremor was absent.  Gait and Station - deferred.   ASSESSMENT/PLAN Mr. Rulon Freitag is a 82 y.o. male with history of hypertension, coronary artery disease, chronic kidney disease, type 2 diabetes mellitus presenting with trouble talking. He did not receive IV t-PA due to unknown onset, LKW.   Probable Orthostatic Hypotension  Secondary to getting OOB to fast to go to bathroom this am  Dizzy when OOB  Worsening of stroke symptoms - trouble swallowing and words out, tearful and anxious  Code stroke called by RN - on assessment, pt without new symptoms. NIHSS=0  CT without acute change, L paracentral brainstem infarct from 2 days ago seen  Patient improved when lying flat  Will check orthostatic vitals  Place TED hose  CTA head and neck - left VA and BA and bilateral ICA siphons and right PCA P2 segment stenoses.  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 L > R narrowing intracranial arteries. Possible L PCA 1.5 mm aneurysm  Carotid Doppler  No significant stenosis   2D Echo  EF 60-65%  LDL canceled, will reorder  HgbA1c canceled, will reorder  Heparin 5000 units  sq tid for VTE prophylaxis Diet NPO time specified  aspirin 81 mg daily prior to admission, now on aspirin 325 mg daily. Due to intracranial stenosis, recommend DAPT for 3 months and then plavix alone.  Patient counseled to be compliant with his antithrombotic medications  Ongoing aggressive stroke risk factor management  Therapy recommendations:  CIR  Disposition:  pending    Hx of SVT  Followed with Dr. Tamala Julian in cardiology in the past  Treated with IV injection  Valsalva helped to abort episodes  Do not feel needs further cardiac event monitoring as current stroke likely small vessel event and hx of SVT pretty typical.  Hypertensive  Emergency  BP 225/102 on arrival in setting of neurologic symptoms  Remains elevated  Permissive hypertension (OK if < 220/120) but gradually normalize in 5-7 days  Long-term BP goal 130-150 due to BA stenosis  Hyperlipidemia  Home meds:  No statin  LDL canceled, will reorder, goal < 70  Hx of lipitor allergy  Put on low dose pravastatin  Continue statin at discharge  DM  A1C canceled, will reorder  SSI  CBG monitoring  Other Stroke Risk Factors  Advanced age  Overweight, Body mass index is 27.75 kg/m., recommend weight loss, diet and exercise as appropriate   Coronary artery disease - s/p PTCA  PVD s/p fem pop bypass  Other Active Problems  CKD  Hx PE  Tooth extraction - follow up with dentist for antiplatelet management perioperatively  Hospital day # 1  Neurology will sign off. Please call with questions. Pt will follow up with Cecille Rubin NP at [facility] in about 2 months. Thanks for the consult.  Rosalin Hawking, MD PhD Stroke Neurology 10/16/2015 5:08 PM    To contact Stroke Continuity provider, please refer to [email or web address] After hours, contact General Neurology

## 2015-10-16 NOTE — Progress Notes (Signed)
Patient complained of difficulty swallowing and trouble getting words out at about  0858 to  Coralyn Pear, MD who was at bedside at the time. MD assessed patient and Charge nurse was notified,  stroke called, pt taken to CT. Patient was alert and orientedX4 the whole time. Pt. Presently in CT. Will continue to monitor.

## 2015-10-16 NOTE — Progress Notes (Signed)
Speech Language Pathology Treatment: Cognitive-Linquistic  Patient Details Name: [name] MRN: [id number] DOB: 07/01/1927 Today's Date: 10/16/2015 Time: [id number] SLP Time Calculation (min) (ACUTE ONLY): 8 min  Assessment / Plan / Recommendation Clinical Impression  Pt seen for brief speech treatment for dysarthria following BSE. SLP introduced speech strategies to facilitate conversational speech. Sister and granddaughter reported difficulty understanding pt on the phone. Pt given mild cues to slow rate and over exaggerate lips and tongue. Will continue ST with hopeful rehab admission soon.     HPI HPI: Pt is an 82 y/o m admitted due to neuro changes including "stumbling over words". He has been diagnosed with paramedian pons stroke.BSE 8/17 revealed functional and safe swallow. Dys 3/thin recommended due to dental pain. 8/18 pt complained of difficulty speaking, swallowing and pocketing of food. Repeat CT negative.        SLP Plan  Continue with current plan of care     Recommendations  Medication Administration: Whole meds with liquid Compensations: Slow rate;Small sips/bites;Lingual sweep for clearance of pocketing             General recommendations: Rehab consult Oral Care Recommendations: Oral care BID Follow up Recommendations: Inpatient Rehab Plan: Continue with current plan of care                    Houston Siren 10/16/2015, 3:19 PM   Orbie Pyo Colvin Caroli.Ed [employer] [phone]

## 2015-10-16 NOTE — Evaluation (Addendum)
Clinical/Bedside Swallow Evaluation Patient Details  Name: [name] MRN: [id number] Date of Birth: 11-11-1927  Today's Date: 10/16/2015 Time: SLP Start Time (ACUTE ONLY): 1417 SLP Stop Time (ACUTE ONLY): 1435 SLP Time Calculation (min) (ACUTE ONLY): 18 min  Past Medical History:  Past Medical History:  Diagnosis Date  . Adenomatous polyp   . CAD (coronary artery disease)   . Chronic kidney disease   . Diverticulosis   . DVT (deep venous thrombosis) ([place])   . Gout   . History of pulmonary embolus (PE)    from a DVT in 2008  . Hyperglycemia   . Hyperlipidemia   . Hypertension   . Mallory-Weiss tear   . Peripheral vascular disease ([place])   . SVT (supraventricular tachycardia) ([place])   . Vitamin B12 deficiency    Past Surgical History:  Past Surgical History:  Procedure Laterality Date  .  PTCA of coronary lesion Right 1995  . APPENDECTOMY    . PR VEIN BYPASS GRAFT,AORTO-FEM-POP Left    femoral-popliteal BP by Dr. Drucie Opitz  . repair of aal fissure   1978   HPI:  Pt is an 82 y/o m admitted due to neuro changes including "stumbling over words". He has been diagnosed with paramedian pons stroke.BSE 8/17 revealed functional and safe swallow. Dys 3/thin recommended due to dental pain. 8/18 pt complained of difficulty speaking, swallowing and pocketing of food. Order to reassess swallow.    Assessment / Plan / Recommendation Clinical Impression  Pt presents with mild right facial droop compared to documentation from yesterday's BSE. Saliva leakage on right side during oral motor exam not present yesterday per documentation. Mastication was timely with independent self monitoring for pocketing on right side. No s/s aspiration. Consumed pills with thin water via RN without difficulty. Pt stated he had difficulty manipulating eggs and sausage this morning. Grimaced during swallow with cracker complaining of esophageal sensation which pt reports has been present for years (hx of  esophageal tear/hemoorrhage). Recommend resume Dys 3 texture, thin liquids, pills with thin, check right side for pocketing, masticate on left side oral cavity, sit upright and remain upright. ST will briefly follow up for safety and efficiency with swallow.        Aspiration Risk  Mild aspiration risk    Diet Recommendation Dysphagia 3 (Mech soft);Thin liquid   Liquid Administration via: Cup;Straw Medication Administration: Whole meds with liquid Supervision: Patient able to self feed Compensations: Slow rate;Small sips/bites;Lingual sweep for clearance of pocketing Postural Changes: Remain upright for at least 30 minutes after po intake;Seated upright at 90 degrees    Other  Recommendations Oral Care Recommendations: Oral care BID   Follow up Recommendations  None    Frequency and Duration min 1 x/week  1 week       Prognosis Prognosis for Safe Diet Advancement: Good      Swallow Study   General HPI: Pt is an 82 y/o m admitted due to neuro changes including "stumbling over words". He has been diagnosed with paramedian pons stroke.BSE 8/17 revealed functional and safe swallow. Dys 3/thin recommended due to dental pain. 8/18 pt complained of difficulty speaking, swallowing and pocketing of food. Order to reassess swallow.  Type of Study: Bedside Swallow Evaluation Previous Swallow Assessment:  (see HPI) Diet Prior to this Study: NPO Temperature Spikes Noted: No Respiratory Status: Room air History of Recent Intubation: No Behavior/Cognition: Alert;Cooperative;Pleasant mood Oral Cavity Assessment: Within Functional Limits Oral Care Completed by SLP: No Oral Cavity - Dentition: Adequate  natural dentition Vision: Functional for self-feeding Self-Feeding Abilities: Able to feed self Patient Positioning: Upright in bed Baseline Vocal Quality: Low vocal intensity Volitional Cough: Strong Volitional Swallow: Able to elicit    Oral/Motor/Sensory Function Overall Oral  Motor/Sensory Function: Mild impairment Facial ROM: Reduced right Facial Symmetry: Abnormal symmetry right Facial Strength: Reduced right Lingual ROM: Within Functional Limits Lingual Symmetry: Within Functional Limits Lingual Strength: Within Functional Limits Velum: Within Functional Limits Mandible: Within Functional Limits   Ice Chips Ice chips: Not tested   Thin Liquid Thin Liquid: Within functional limits Presentation: Spoon;Cup    Nectar Thick Nectar Thick Liquid: Not tested   Honey Thick Honey Thick Liquid: Not tested   Puree Puree: Not tested   Solid   GO   Solid: Within functional limits (indep used tongue to check for right pocketing )        Mick Sell Orbie Pyo 10/16/2015,3:16 PM  Orbie Pyo [place] [phone]

## 2015-10-16 NOTE — Code Documentation (Signed)
82yo male admitted s/p acute infarct in the left pons on 10/14/2015.  Patient c/o difficulty swallowing and getting his words out at 0858 this morning.  Code stroke activated.  Patient transported to CT.  CT head and CTA completed.  Stroke team to the bedside.  NIHSS 0, see documentation for details and code stroke times.  Patient very emotional and tearful and feels like he is having trouble getting his words out.  Patient transported back to 5W12.  No acute stroke treatment at this time.  Bedside handoff with Debbra Riding, RN.

## 2015-10-16 NOTE — Evaluation (Signed)
Physical Therapy Evaluation Patient Details Name: [name] MRN: [id number] DOB: 1927-05-12 Today's Date: 10/16/2015   History of Present Illness  82 y.o. male admitted for slurred speech and RLE weakness. MRI (+) for acute L pons infarct. PMH significant for HTN, HLD, CAD, SVT, PVD, hx of DVT and resulting PE (2008), gout, and CKD.   Clinical Impression  Pt admitted with/for slurred speech and R LE weakness. Positive for L pons infarct..  Pt currently limited functionally due to the problems listed. ( See problems list.)   Pt will benefit from PT to maximize function and safety in order to get ready for next venue listed below.     Follow Up Recommendations CIR    Equipment Recommendations  None recommended by PT (TBD)    Recommendations for Other Services Rehab consult     Precautions / Restrictions Precautions Precautions: Fall      Mobility  Bed Mobility Overal bed mobility: Needs Assistance Bed Mobility: Supine to Sit;Sit to Supine     Supine to sit: Min assist Sit to supine: Min assist   General bed mobility comments: heavy use of rail, min assist for stability  Transfers Overall transfer level: Needs assistance Equipment used: 1 person hand held assist Transfers: Sit to/from Stand Sit to Stand: Min assist         General transfer comment: min for steady assist  Ambulation/Gait Ambulation/Gait assistance: Min assist Ambulation Distance (Feet): 120 Feet Assistive device: 1 person hand held assist Gait Pattern/deviations: Step-through pattern   Gait velocity interpretation: Below normal speed for age/gender General Gait Details: mildly steady in general, but several episodes of instability and mild LOB when lost focus on task.  Stairs            Wheelchair Mobility    Modified Rankin (Stroke Patients Only) Modified Rankin (Stroke Patients Only) Pre-Morbid Rankin Score: No symptoms Modified Rankin: Moderately severe disability     Balance  Overall balance assessment: Needs assistance Sitting-balance support: No upper extremity supported Sitting balance-Leahy Scale: Good     Standing balance support: Single extremity supported;No upper extremity supported Standing balance-Leahy Scale: Fair Standing balance comment: stood at EOB without assist and with shoulder width BOS, turning his head to scan.                             Pertinent Vitals/Pain Pain Assessment: No/denies pain Faces Pain Scale: Hurts a little bit Pain Intervention(s): Monitored during session    [place] expects to be discharged to:: Private residence Living Arrangements: Alone Available Help at Discharge: Family (not sure if they can be with him 24/7 initially) Type of Home: House         Home Equipment: Gilford Rile - 2 wheels      Prior Function Level of Independence: Independent               Hand Dominance   Dominant Hand: Right    Extremity/Trunk Assessment   Upper Extremity Assessment: Defer to OT evaluation           Lower Extremity Assessment: RLE deficits/detail;LLE deficits/detail RLE Deficits / Details: proximal weaknesses 4/5, generally coordinated. LLE Deficits / Details: WFL, mild proximal weakness     Communication   Communication: Expressive difficulties  Cognition Arousal/Alertness: Awake/alert Behavior During Therapy: Restless Overall Cognitive Status: Within Functional Limits for tasks assessed  General Comments      Exercises        Assessment/Plan    PT Assessment Patient needs continued PT services  PT Diagnosis Difficulty walking;Abnormality of gait;Generalized weakness   PT Problem List Decreased strength;Decreased activity tolerance;Decreased balance;Decreased mobility;Decreased coordination  PT Treatment Interventions Gait training;Functional mobility training;Therapeutic activities;Balance training;Neuromuscular  re-education;Patient/family education;DME instruction   PT Goals (Current goals can be found in the Care Plan section) Acute Rehab PT Goals Patient Stated Goal: "just get me home" PT Goal Formulation: With patient Time For Goal Achievement: 10/30/15 Potential to Achieve Goals: Good    Frequency Min 3X/week   Barriers to discharge        Co-evaluation               End of Session   Activity Tolerance: Patient tolerated treatment well Patient left: in bed;with call bell/phone within reach;with family/visitor present Nurse Communication: Mobility status         Time: [id number] PT Time Calculation (min) (ACUTE ONLY): 25 min   Charges:   PT Evaluation $PT Eval Moderate Complexity: 1 Procedure PT Treatments $Gait Training: 8-22 mins   PT G Codes:        Jaleea Alesi, [name] 10/16/2015, 5:02 PM 10/16/2015  Donnella Sham, PT [phone]  (pager)

## 2015-10-16 NOTE — Progress Notes (Signed)
Patient down in CT for follow up therapy. Daughter in room and reports that patient with neurological changes this am--increase difficulty speaking, pocketing of food as well as decrease in RUE coordination. Will follow later

## 2015-10-17 ENCOUNTER — Inpatient Hospital Stay (HOSPITAL_COMMUNITY): Payer: Medicare Other

## 2015-10-17 DIAGNOSIS — I69391 Dysphagia following cerebral infarction: Secondary | ICD-10-CM

## 2015-10-17 LAB — LIPID PANEL
Cholesterol: 207 mg/dL — ABNORMAL HIGH (ref 0–200)
HDL: 34 mg/dL — ABNORMAL LOW (ref >40)
LDL CALC: 141 mg/dL — AB (ref 0–99)
Total CHOL/HDL Ratio: 6.1 RATIO
Triglycerides: 159 mg/dL — ABNORMAL HIGH (ref <150)
VLDL: 32 mg/dL (ref 0–40)

## 2015-10-17 LAB — BASIC METABOLIC PANEL WITH GFR
Anion gap: 8 (ref 5–15)
BUN: 21 mg/dL — ABNORMAL HIGH (ref 6–20)
CO2: 22 mmol/L (ref 22–32)
Calcium: 9.3 mg/dL (ref 8.9–10.3)
Chloride: 105 mmol/L (ref 101–111)
Creatinine, Ser: 1.49 mg/dL — ABNORMAL HIGH (ref 0.61–1.24)
GFR calc Af Amer: 47 mL/min — ABNORMAL LOW
GFR calc non Af Amer: 41 mL/min — ABNORMAL LOW
Glucose, Bld: 133 mg/dL — ABNORMAL HIGH (ref 65–99)
Potassium: 3.9 mmol/L (ref 3.5–5.1)
Sodium: 135 mmol/L (ref 135–145)

## 2015-10-17 LAB — GLUCOSE, CAPILLARY
GLUCOSE-CAPILLARY: 142 mg/dL — AB (ref 65–99)
GLUCOSE-CAPILLARY: 143 mg/dL — AB (ref 65–99)
Glucose-Capillary: 131 mg/dL — ABNORMAL HIGH (ref 65–99)
Glucose-Capillary: 125 mg/dL — ABNORMAL HIGH (ref 65–99)
Glucose-Capillary: 195 mg/dL — ABNORMAL HIGH (ref 65–99)

## 2015-10-17 LAB — CBC
HCT: 49.2 % (ref 39.0–52.0)
Hemoglobin: 16.6 g/dL (ref 13.0–17.0)
MCH: 30.2 pg (ref 26.0–34.0)
MCHC: 33.7 g/dL (ref 30.0–36.0)
MCV: 89.6 fL (ref 78.0–100.0)
Platelets: 178 K/uL (ref 150–400)
RBC: 5.49 MIL/uL (ref 4.22–5.81)
RDW: 13.9 % (ref 11.5–15.5)
WBC: 8.4 K/uL (ref 4.0–10.5)

## 2015-10-17 LAB — PLATELET INHIBITION P2Y12: Platelet Function  P2Y12: 138 [PRU] — ABNORMAL LOW (ref 194–418)

## 2015-10-17 MED ORDER — AMOXICILLIN 250 MG/5ML PO SUSR
500.0000 mg | Freq: Three times a day (TID) | ORAL | Status: DC
  Administered 2015-10-17 – 2015-10-21 (×9): 500 mg via ORAL
  Filled 2015-10-17 (×15): qty 10

## 2015-10-17 NOTE — Progress Notes (Signed)
Asked stroke team to come back by and reassess patient. According to patient he feels like he is getting weak and repeating the episode he had yesterday. Dr Coralyn Pear is also aware and has re-eval patient. SLP also has re-eval patient. Will continue to monitor. Joslyn Hy, MSN, RN, [employer]

## 2015-10-17 NOTE — Progress Notes (Signed)
Ambulated in hall with walker. No incident. Joslyn Hy, MSN, RN, CMSRN\

## 2015-10-17 NOTE — Progress Notes (Signed)
Speech Language Pathology Treatment: Dysphagia  Patient Details Name: [name] MRN: [id number] DOB: September 18, 1927 Today's Date: 10/17/2015 Time: [id number] SLP Time Calculation (min) (ACUTE ONLY): 21 min  Assessment / Plan / Recommendation Clinical Impression  Requested by MD to f/u with patient given reoccurrence of CVA symptoms (right facial droop, right arm weakness) this am, similar to episode 8/18. Evaluation results largely consistent with function 8/18 in which patient presents with mild-moderate ([perhaps slightly increased from 8/18) right sided facial droop but with functional mastication of solids and no overt s/s of aspiration. Patient continues to c/o globus sensation post swallow with both liquids and solids which is not new but potentially worsening. Suspect this is related to esophageal dysfunction (hx of esophageal tear) rather than a result of pontine CVA. Provided patient and wife with education regarding recommendations for alternation of bite and sip in hopes of alleviating esophageal symptoms. At this time, recommend continuation of current diet with aspiration and reflux precautions and barium swallow to evaluate esophageal function. Will f/u for results of barium swallow to determine further SLP needs.    HPI HPI: Pt is an 82 y/o m admitted due to neuro changes including "stumbling over words". He has been diagnosed with paramedian pons stroke.BSE 8/17 revealed functional and safe swallow. Dys 3/thin recommended due to dental pain. 8/18 pt complained of difficulty speaking, swallowing and pocketing of food. Order to reassess swallow.       SLP Plan  Continue with current plan of care     Recommendations  Diet recommendations: Dysphagia 3 (mechanical soft);Thin liquid Liquids provided via: Cup;Straw Medication Administration: Crushed with puree Supervision: Patient able to self feed;Intermittent supervision to cue for compensatory strategies Compensations: Slow rate;Small  sips/bites;Follow solids with liquid Postural Changes and/or Swallow Maneuvers: Seated upright 90 degrees;Upright 30-60 min after meal             General recommendations: Rehab consult Oral Care Recommendations: Oral care BID Follow up Recommendations: Inpatient Rehab Plan: Continue with current plan of care     [place], [place] [phone]   [place] 10/17/2015, 11:19 AM

## 2015-10-17 NOTE — Progress Notes (Signed)
PROGRESS NOTE    [name]  [id number] DOB: [birth year] DOA: 10/14/2015 PCP: Mathews Argyle, MD   Brief Narrative:  [name] is a pleasant 82 year old gentleman with a past medical history of coronary artery disease, hypertension, chronic disease, diabetes mellitus admitted to medicine service on 10/14/2015 when he presented with complaints of slurred speech, workup included MRI of brain that revealed an acute infarct in the left pons.    Assessment & Plan:   Principal Problem:   CVA (cerebral vascular accident) [facility]) Active Problems:   Peripheral vascular disease, unspecified ([place])   CVA (cerebral infarction)   Accelerated hypertension   Type 2 diabetes mellitus ([place])   HLD (hyperlipidemia)   Essential hypertension   Orthostatic hypotension  1.  Acute CVA -[name] presenting with a 3 day history of dysarthria, workup included MRI of brain showing an acute CVA involving left pons. He was evaluated by neurology overnight. -Carotid Dopplers showed 1-39% stenosis involving right and left internal carotid arteries, transthoracic echocardiogram showing EF of 60-65% with grade 1 diastolic dysfunction -Continue antiplatelet therapy with aspirin -10/16/2015 patient reporting difficulty swallowing, having concerns for worsening focal deficits Code CVA was called as he underwent stat CT scan of head. Radiology did not report new intracranial abnormality. CTA showing the left vertebral artery severely atherosclerotic with moderate to severe irregularity and stenosis of the proximal basilar artery -Spoke with Neurology will treat with dual agent antiplatelet therapy with ASA and Plavix. -On 10/17/2015 he reported difficulties swallowing, presentation similar to 10/16/2015. SLP and Neurology re-consulted. SLP recommended barium swallow.  -Repeat MRI pending.   2.  Hypertension. -Patient presented with stroke, allowing for permissive hypertension, breaking blood pressures down slowly.    -Will increase Cozaar to 100 mg by mouth dail and continue metoprolol at 25 mg by mouth twice a day, as we work to gradually bring down blood pressures  3.  Chronic kidney disease  -Lab work showing creatinine of 1.5   DVT prophylaxis: subcutaneous heparin  Code Status: Full Code  Family Communication: Spoke with his wife was present at bedside Disposition Plan: Plan for inpatient rehabilitation  Consultants:   Neurology   Procedures:   Transthoracic echocardiogram performed on 10/15/2015 Impression: - Left ventricle: The cavity size was normal. Systolic function was   normal. The estimated ejection fraction was in the range of 60%   to 65%. Wall motion was normal; there were no regional wall   motion abnormalities. Doppler parameters are consistent with   abnormal left ventricular relaxation (grade 1 diastolic   dysfunction). Doppler parameters are consistent with   indeterminate ventricular filling pressure.  Subjective: Patient again reporting difficulty swallowing this morning, symptoms similar to yesterday he states.   Objective: Vitals:   10/16/15 2117 10/16/15 2118 10/17/15 0546 10/17/15 0851  BP: (!) 165/74 (!) 147/72 (!) 165/72 (!) 183/72  Pulse: 64  65 80  Resp: 19  18   Temp: 97.6 F (36.4 C)  98 F (36.7 C)   TempSrc:   Oral   SpO2: 97%  93%   Weight:      Height:        Intake/Output Summary (Last 24 hours) at 10/17/15 1417 Last data filed at 10/17/15 1019  Gross per 24 hour  Intake               50 ml  Output                0 ml  Net  50 ml   Filed Weights   10/14/15 1233  Weight: 80.4 kg (177 lb 3.2 oz)    Examination:  General exam: Anxious Respiratory system: Clear to auscultation. Respiratory effort normal. Cardiovascular system: S1 & S2 heard, RRR. No JVD, murmurs, rubs, gallops or clicks. No pedal edema. Gastrointestinal system: Abdomen is nondistended, soft and nontender. No organomegaly or masses felt. Normal bowel  sounds heard. Central nervous system: There is mild right sided facial droop, along with 4/5 muscle strength to right upper and right lower extremities.  Extremities: Symmetric 5 x 5 power. Skin: No rashes, lesions or ulcers Psychiatry: has mild dysarthria  Data Reviewed: I have personally reviewed following labs and imaging studies  CBC:  Recent Labs Lab 10/14/15 1255 10/14/15 1307 10/17/15 0551  WBC 8.6  --  8.4  NEUTROABS 5.8  --   --   HGB 17.1* 17.7* 16.6  HCT 50.3 52.0 49.2  MCV 89.0  --  89.6  PLT 187  --  0000000   Basic Metabolic Panel:  Recent Labs Lab 10/14/15 1255 10/14/15 1307 10/17/15 0551  NA 138 140 135  K 4.5 4.6 3.9  CL 104 102 105  CO2 27  --  22  GLUCOSE 127* 124* 133*  BUN 25* 27* 21*  CREATININE 1.45* 1.50* 1.49*  CALCIUM 9.8  --  9.3   GFR: Estimated Creatinine Clearance: 36.1 mL/min (by C-G formula based on SCr of 1.49 mg/dL). Liver Function Tests:  Recent Labs Lab 10/14/15 1255  AST 71*  ALT 103*  ALKPHOS 53  BILITOT 0.7  PROT 8.4*  ALBUMIN 4.8   No results for input(s): LIPASE, AMYLASE in the last 168 hours. No results for input(s): AMMONIA in the last 168 hours. Coagulation Profile:  Recent Labs Lab 10/14/15 1255  INR 1.01   Cardiac Enzymes: No results for input(s): CKTOTAL, CKMB, CKMBINDEX, TROPONINI in the last 168 hours. BNP (last 3 results) No results for input(s): PROBNP in the last 8760 hours. HbA1C: No results for input(s): HGBA1C in the last 72 hours. CBG:  Recent Labs Lab 10/16/15 2028 10/16/15 2353 10/17/15 0410 10/17/15 0846 10/17/15 1136  GLUCAP 132* 118* 131* 142* 143*   Lipid Profile:  Recent Labs  10/17/15 0551  CHOL 207*  HDL 34*  LDLCALC 141*  TRIG 159*  CHOLHDL 6.1   Thyroid Function Tests: No results for input(s): TSH, T4TOTAL, FREET4, T3FREE, THYROIDAB in the last 72 hours. Anemia Panel: No results for input(s): VITAMINB12, FOLATE, FERRITIN, TIBC, IRON, RETICCTPCT in the last 72  hours. Sepsis Labs: No results for input(s): PROCALCITON, LATICACIDVEN in the last 168 hours.  No results found for this or any previous visit (from the past 240 hour(s)).       Radiology Studies: Ct Angio Head W Or Wo Contrast  Addendum Date: 10/16/2015   ADDENDUM REPORT: 10/16/2015 10:20 ADDENDUM: Study discussed by telephone with Dr. Roland Rack on 10/16/2015 At 1014 hours. We also discussed the bulky soft plaque or mural thrombus in the distal arch, which is not strongly suspected to be a factor in the intracranial ischemia given noted is distal to the left subclavian origin. Electronically Signed   By: Genevie Ann M.D.   On: 10/16/2015 10:20   Result Date: 10/16/2015 CLINICAL DATA:  82 year old male with right side weakness aphasia and trouble swallowing. Initial encounter. Left paracentral brainstem infarct on recent MRI. EXAM: CT ANGIOGRAPHY HEAD AND NECK TECHNIQUE: Multidetector CT imaging of the head and neck was performed using the standard protocol  during bolus administration of intravenous contrast. Multiplanar CT image reconstructions and MIPs were obtained to evaluate the vascular anatomy. Carotid stenosis measurements (when applicable) are obtained utilizing NASCET criteria, using the distal internal carotid diameter as the denominator. CONTRAST:  50 mL Isovue 370 COMPARISON:  Head CT without contrast 912 hours today. Brain MRI and intracranial MRA 10/14/2015. FINDINGS: CTA NECK Skeleton: No acute osseous abnormality identified. Fairly advanced cervical spine degeneration. Visualized paranasal sinuses and mastoids are stable and well pneumatized. Other neck: Mild upper lung atelectasis. No superior mediastinal lymphadenopathy. Negative thyroid, larynx, pharynx, parapharyngeal spaces, retropharyngeal space, sublingual space, submandibular glands and parotid glands. No cervical lymphadenopathy. Aortic arch: 4 vessel arch configuration, the left vertebral artery arises directly from the  arch. Bulky soft plaque or mural thrombus in the distal arch, primarily distal to the left subclavian origin. Superimposed more ordinary soft and calcified arch atherosclerosis. Right carotid system: Circumferential soft plaque in the right CCA, but no hemodynamically significant stenosis proximal to the right carotid bifurcation. At the bifurcation right ICA origins stenosis up to 55% occurs. Cervical right ICA otherwise is negative. Left carotid system: No left CCA origin stenosis despite soft and calcified plaque. Circumferential soft plaque in the left CCA, including some low-density plaque in the medial vessel at the level of the a hyoid (series 701, image 65). Soft and calcified plaque at the left carotid bifurcation without stenosis. Circumferential soft plaque been affects the left ICA distal to the bulb, but no hemodynamically significant stenosis occurs. Vertebral arteries:Right subclavian artery origin fifty % stenosis with respect to the distal vessel related to soft plaque. Calcified plaque at the right vertebral artery origin with mild to moderate stenosis. Calcified plaque in the right V1 segment without stenosis. No other right vertebral artery stenosis to the skullbase. The left vertebral artery arises directly from the arch, but its origin and proximal segment are highly stenotic. The V1 segment is diminutive and highly irregular. The left V2 segment is slightly better enhancing. There are tandem moderate to severe stenoses in the left V3 segment, and where the vessel crosses the dura. See intracranial findings below. CTA HEAD Posterior circulation: The dominant distal right vertebral artery demonstrates calcified plaque at the skullbase not resulting in significant stenosis. The right PICA origin is patent. The right vertebral artery supplies the basilar. The distal left vertebral artery is occluded from the C1 level to the left PICA origin. The vessel is poorly reconstituted from the left PICA to  the vertebrobasilar junction, with another short segmental occlusion. There is moderate to severe irregularity and stenosis of the proximal basilar artery between the vertebrobasilar junction and right IAC origin as seen on series 703, image 115. This appears more pronounced than on the intracranial MRA yesterday. Basilar remains patent. Posterior communicating arteries are diminutive or absent. SCA and PCA origins are patent although there is bilateral PCA origin moderate to severe stenosis. The left PCA branches then are within normal limits. There is AA severe right PCA P2 segment stenosis beyond which distal right PCA branches are poorly enhancing. This is stable to the MRA finding yesterday. Anterior circulation: Both ICA siphons are patent although there is moderate to severe siphon soft and calcified plaque. There is severe stenosis at the right anterior genu related the calcified plaque. There is moderate to severe stenosis in the left cavernous segment and in the proximal left supraclinoid segment related to soft and calcified plaque. Both carotid termini remain patent. Both MCA and ACA origins are normal. Anterior  communicating artery and bilateral ACA branches are within normal limits. Left MCA M1 segment, bifurcation, and left MCA branches are within normal limits. Right MCA M1 segment, trifurcation, and right proximal right MCA branches are within normal limits. There is some distal right M2 irregularity and stenosis in the posterior division (series 706, image 20). Venous sinuses: Patent. Anatomic variants: The left vertebral artery arises directly from the arch. Dominant right vertebral artery. IMPRESSION: 1. Negative for intracranial emergent large vessel occlusion. However, the left vertebral artery is severely atherosclerotic throughout its course and is intermittently occluded in the left V4 segment. And there is moderate to severe irregularity and stenosis of the proximal basilar artery which  appears somewhat more pronounced than on the MRA yesterday. 2. Additionally there is moderate to severe intracranial atherosclerosis of the bilateral ICA siphons and right PCA P2 segment. 3. Abundant soft plaque in both cervical carotid arteries, with some areas of low-density soft plaque noted. Stenosis up to 55% occurs. 4. Mild to moderate stenosis of the origin of the dominant right vertebral artery. Electronically Signed: By: Genevie Ann M.D. On: 10/16/2015 10:12   Ct Angio Neck W Or Wo Contrast  Addendum Date: 10/16/2015   ADDENDUM REPORT: 10/16/2015 10:20 ADDENDUM: Study discussed by telephone with Dr. Roland Rack on 10/16/2015 At 1014 hours. We also discussed the bulky soft plaque or mural thrombus in the distal arch, which is not strongly suspected to be a factor in the intracranial ischemia given noted is distal to the left subclavian origin. Electronically Signed   By: Genevie Ann M.D.   On: 10/16/2015 10:20   Result Date: 10/16/2015 CLINICAL DATA:  82 year old male with right side weakness aphasia and trouble swallowing. Initial encounter. Left paracentral brainstem infarct on recent MRI. EXAM: CT ANGIOGRAPHY HEAD AND NECK TECHNIQUE: Multidetector CT imaging of the head and neck was performed using the standard protocol during bolus administration of intravenous contrast. Multiplanar CT image reconstructions and MIPs were obtained to evaluate the vascular anatomy. Carotid stenosis measurements (when applicable) are obtained utilizing NASCET criteria, using the distal internal carotid diameter as the denominator. CONTRAST:  50 mL Isovue 370 COMPARISON:  Head CT without contrast 912 hours today. Brain MRI and intracranial MRA 10/14/2015. FINDINGS: CTA NECK Skeleton: No acute osseous abnormality identified. Fairly advanced cervical spine degeneration. Visualized paranasal sinuses and mastoids are stable and well pneumatized. Other neck: Mild upper lung atelectasis. No superior mediastinal  lymphadenopathy. Negative thyroid, larynx, pharynx, parapharyngeal spaces, retropharyngeal space, sublingual space, submandibular glands and parotid glands. No cervical lymphadenopathy. Aortic arch: 4 vessel arch configuration, the left vertebral artery arises directly from the arch. Bulky soft plaque or mural thrombus in the distal arch, primarily distal to the left subclavian origin. Superimposed more ordinary soft and calcified arch atherosclerosis. Right carotid system: Circumferential soft plaque in the right CCA, but no hemodynamically significant stenosis proximal to the right carotid bifurcation. At the bifurcation right ICA origins stenosis up to 55% occurs. Cervical right ICA otherwise is negative. Left carotid system: No left CCA origin stenosis despite soft and calcified plaque. Circumferential soft plaque in the left CCA, including some low-density plaque in the medial vessel at the level of the a hyoid (series 701, image 65). Soft and calcified plaque at the left carotid bifurcation without stenosis. Circumferential soft plaque been affects the left ICA distal to the bulb, but no hemodynamically significant stenosis occurs. Vertebral arteries:Right subclavian artery origin fifty % stenosis with respect to the distal vessel related to soft plaque. Calcified  plaque at the right vertebral artery origin with mild to moderate stenosis. Calcified plaque in the right V1 segment without stenosis. No other right vertebral artery stenosis to the skullbase. The left vertebral artery arises directly from the arch, but its origin and proximal segment are highly stenotic. The V1 segment is diminutive and highly irregular. The left V2 segment is slightly better enhancing. There are tandem moderate to severe stenoses in the left V3 segment, and where the vessel crosses the dura. See intracranial findings below. CTA HEAD Posterior circulation: The dominant distal right vertebral artery demonstrates calcified plaque at  the skullbase not resulting in significant stenosis. The right PICA origin is patent. The right vertebral artery supplies the basilar. The distal left vertebral artery is occluded from the C1 level to the left PICA origin. The vessel is poorly reconstituted from the left PICA to the vertebrobasilar junction, with another short segmental occlusion. There is moderate to severe irregularity and stenosis of the proximal basilar artery between the vertebrobasilar junction and right IAC origin as seen on series 703, image 115. This appears more pronounced than on the intracranial MRA yesterday. Basilar remains patent. Posterior communicating arteries are diminutive or absent. SCA and PCA origins are patent although there is bilateral PCA origin moderate to severe stenosis. The left PCA branches then are within normal limits. There is AA severe right PCA P2 segment stenosis beyond which distal right PCA branches are poorly enhancing. This is stable to the MRA finding yesterday. Anterior circulation: Both ICA siphons are patent although there is moderate to severe siphon soft and calcified plaque. There is severe stenosis at the right anterior genu related the calcified plaque. There is moderate to severe stenosis in the left cavernous segment and in the proximal left supraclinoid segment related to soft and calcified plaque. Both carotid termini remain patent. Both MCA and ACA origins are normal. Anterior communicating artery and bilateral ACA branches are within normal limits. Left MCA M1 segment, bifurcation, and left MCA branches are within normal limits. Right MCA M1 segment, trifurcation, and right proximal right MCA branches are within normal limits. There is some distal right M2 irregularity and stenosis in the posterior division (series 706, image 20). Venous sinuses: Patent. Anatomic variants: The left vertebral artery arises directly from the arch. Dominant right vertebral artery. IMPRESSION: 1. Negative for  intracranial emergent large vessel occlusion. However, the left vertebral artery is severely atherosclerotic throughout its course and is intermittently occluded in the left V4 segment. And there is moderate to severe irregularity and stenosis of the proximal basilar artery which appears somewhat more pronounced than on the MRA yesterday. 2. Additionally there is moderate to severe intracranial atherosclerosis of the bilateral ICA siphons and right PCA P2 segment. 3. Abundant soft plaque in both cervical carotid arteries, with some areas of low-density soft plaque noted. Stenosis up to 55% occurs. 4. Mild to moderate stenosis of the origin of the dominant right vertebral artery. Electronically Signed: By: Genevie Ann M.D. On: 10/16/2015 10:12   Ct Head Code Stroke Wo Contrast  Addendum Date: 10/16/2015   ADDENDUM REPORT: 10/16/2015 09:30 ADDENDUM: Study discussed by telephone with Dr. Roland Rack on 10/16/2015 At 0926 hours. Electronically Signed   By: Genevie Ann M.D.   On: 10/16/2015 09:30   Result Date: 10/16/2015 CLINICAL DATA:  Code stroke. 82 year old male with right side weakness aphasia and trouble swallowing. Initial encounter. Left paracentral brainstem infarct on recent MRI. EXAM: CT HEAD WITHOUT CONTRAST TECHNIQUE: Contiguous axial images were  obtained from the base of the skull through the vertex without intravenous contrast. COMPARISON:  Brain MRI and intracranial MRA 10/14/2015 FINDINGS: Visualized paranasal sinuses and mastoids are stable and well pneumatized. No acute osseous abnormality identified. No acute orbit or scalp soft tissue findings. Extensive Calcified atherosclerosis at the skull base. Mild hypodensity corresponding to the left paracentral pontine infarct seen by MRI. No associated hemorrhage or mass effect. No superimposed acute cortically based infarct identified. No acute intracranial hemorrhage identified. ASPECTS [facility] Stroke Program Early CT Score) Total score (0-10 with  10 being normal): 10. Stable cerebral volume. No ventriculomegaly. Basilar cisterns remain patent. IMPRESSION: 1. Expected CT appearance of the left paracentral brainstem infarct seen by MRI 2 days ago. No associated hemorrhage or mass effect. 2. No superimposed acute cortically based infarct or new intracranial abnormality. 3. ASPECTS is 10. Electronically Signed: By: Genevie Ann M.D. On: 10/16/2015 09:23        Scheduled Meds: . amoxicillin  500 mg Oral Q8H  . aspirin  300 mg Rectal Daily   Or  . aspirin  325 mg Oral Daily  . clopidogrel  75 mg Oral Daily  . heparin  5,000 Units Subcutaneous Q8H  . insulin aspart  0-9 Units Subcutaneous Q4H  . losartan  100 mg Oral Daily  . metoprolol tartrate  25 mg Oral BID  . pravastatin  20 mg Oral q1800   Continuous Infusions:    LOS: 2 days    Time spent: 25 min    Kelvin Cellar, MD [facility] Pager [phone]  If 7PM-7AM, please contact night-coverage [email or web address] Password [email or web address] 10/17/2015, 2:17 PM

## 2015-10-17 NOTE — Progress Notes (Addendum)
STROKE TEAM PROGRESS NOTE   HPI: This is an 82-yo RH man who presented to [facility] ED today after experiencing difficulty speaking for the past 2-3 days. History is obtained directly from the patient who is an excellent historian. I have also reviewed his chart at length.   He reports that approximately 3 days ago, he noticed that he was having some difficulty with his speech. He describes that he has been stumbling over words and at times cannot get them out appropriately. His symptoms have remained stable since he first noticed them. He initially denied any additional symptoms. However, further in conversation, he recalled that he was having difficulty moving his right leg from the gas to the brake when he was driving 2 days ago. I notice in the emergency department physician's note that it was mentioned that his PCP felt he was favoring his right leg while walking. The patient tells me that this has been a long-standing issue for the past several years and he does not feel like his walking has been any different over the past couple of days. The only other symptom that he reports is a crawling sensation behind his left ear that extends into the left occipital region. This is intermittent and appeared a few days before his speech deficits. He denies any headache or pain in his neck. He denies any vision loss, double vision, vertigo, hearing changes, difficulty swallowing, numbness, weakness, balance problems, or gait changes.  He had no intention of coming to the hospital to be evaluated. However, he states that this morning a family member felt like his lips looked blue so they took him to his primary care physician. During this visit, his PCP noted that he was having difficulty with his speech and sent him to the emergency department. Her graph of note, the patient typically takes a baby aspirin every day. However, he stopped taking this on 10/11/15 due to some upcoming dental work.   SUBJECTIVE  (INTERVAL HISTORY) Daughter, grand-daughter and great-grand-daughter at bedside. This am he reports difficulty getting food down, numbness on the left face and back of head.  He states worsened symptoms when standing, during micturition and post-prandially.   OBJECTIVE Temp:  [97.3 F (36.3 C)-98 F (36.7 C)] 98 F (36.7 C) (08/19 0546) Pulse Rate:  [57-80] 80 (08/19 0851) Cardiac Rhythm: Normal sinus rhythm (08/19 0705) Resp:  [18-19] 18 (08/19 0546) BP: (147-183)/(72-74) 183/72 (08/19 0851) SpO2:  [93 %-97 %] 93 % (08/19 0546)  CBC:   Recent Labs Lab 10/14/15 1255 10/14/15 1307 10/17/15 0551  WBC 8.6  --  8.4  NEUTROABS 5.8  --   --   HGB 17.1* 17.7* 16.6  HCT 50.3 52.0 49.2  MCV 89.0  --  89.6  PLT 187  --  0000000    Basic Metabolic Panel:   Recent Labs Lab 10/14/15 1255 10/14/15 1307 10/17/15 0551  NA 138 140 135  K 4.5 4.6 3.9  CL 104 102 105  CO2 27  --  22  GLUCOSE 127* 124* 133*  BUN 25* 27* 21*  CREATININE 1.45* 1.50* 1.49*  CALCIUM 9.8  --  9.3    Lipid Panel:     Component Value Date/Time   CHOL 207 (H) 10/17/2015 0551   TRIG 159 (H) 10/17/2015 0551   HDL 34 (L) 10/17/2015 0551   CHOLHDL 6.1 10/17/2015 0551   VLDL 32 10/17/2015 0551   LDLCALC 141 (H) 10/17/2015 0551   HgbA1c: No results found for: HGBA1C  Urine Drug Screen:     Component Value Date/Time   LABOPIA NONE DETECTED 10/14/2015 1250   COCAINSCRNUR NONE DETECTED 10/14/2015 1250   LABBENZ NONE DETECTED 10/14/2015 1250   AMPHETMU NONE DETECTED 10/14/2015 1250   THCU NONE DETECTED 10/14/2015 1250   LABBARB NONE DETECTED 10/14/2015 1250      IMAGING I have personally reviewed the radiological images below and agree with the radiology interpretations.  Mr Brain Wo Contrast 10/14/2015 Acute infarct in the left pons. Generalized atrophy with mild chronic microvascular ischemia in the white matter.   Mr Jodene Nam Head/brain Wo Cm 10/14/2015 1. Moderate narrowing of the midportion of the  basilar artery, likely secondary to atherosclerotic disease. 2. Multifocal moderate to severe narrowing of the intracranial internal carotid arteries, left worse than right. 3. Possible 1.5 mm aneurysm versus atherosclerotic irregularity at the left posterior communicating artery origin.   Carotid Doppler   There is 1-39% bilateral ICA stenosis. Vertebral artery flow is antegrade.    TTE - Left ventricle: The cavity size was normal. Systolic function was normal. The estimated ejection fraction was in the range of 60% to 65%. Wall motion was normal; there were no regional wall motionbnormalities. Doppler parameters are consistent with abnormal left ventricular relaxation (grade 1 diastolic dysfunction). Doppler parameters are consistent with indeterminate ventricular filling pressure. - Aortic valve: Transvalvular velocity was within the normal range. There was no stenosis. There was mild regurgitation. - Mitral valve: Transvalvular velocity was within the normal range. There was no evidence for stenosis. There was mild regurgitation. - Right ventricle: The cavity size was normal. Wall thickness was normal. Systolic function was normal. - Atrial septum: No defect or patent foramen ovale was identified by color flow Doppler. - Tricuspid valve: There was no regurgitation.  Ct Head Code Stroke Wo Contrast 10/16/2015 1. Expected CT appearance of the left paracentral brainstem infarct seen by MRI 2 days ago. No associated hemorrhage or mass effect.  2. No superimposed acute cortically based infarct or new intracranial abnormality.  3. ASPECTS is 10.   CT angio Head and Neck 10/16/2015 1. Negative for intracranial emergent large vessel occlusion. However, the left vertebral artery is severely atherosclerotic throughout its course and is intermittently occluded in the left V4 segment. And there is moderate to severe irregularity and stenosis of the proximal basilar artery which appears somewhat more  pronounced than on the MRA yesterday. 2. Additionally there is moderate to severe intracranial atherosclerosis of the bilateral ICA siphons and right PCA P2 segment. 3. Abundant soft plaque in both cervical carotid arteries, with some areas of low-density soft plaque noted. Stenosis up to 55% occurs. 4. Mild to moderate stenosis of the origin of the dominant right vertebral artery.    PHYSICAL EXAM  Temp:  [97.3 F (36.3 C)-98 F (36.7 C)] 98 F (36.7 C) (08/19 0546) Pulse Rate:  [57-80] 80 (08/19 0851) Resp:  [18-19] 18 (08/19 0546) BP: (147-183)/(72-74) 183/72 (08/19 0851) SpO2:  [93 %-97 %] 93 % (08/19 0546)  General - Well nourished, well developed, in no apparent distress.  Cardiovascular - Regular rate and rhythm.  Pulmonary- CTA  Abdomen:  Soft NT, ND  Extrem:  No C/C/E  Mental Status -  Level of arousal and orientation to time, place, and person were intact. Anxious and tearful. Language including expression, naming, repetition, comprehension was assessed and found intact. Fund of Knowledge was assessed and was intact. + more dysarthria today  Cranial Nerves II - XII - II - Visual field  intact OU. III, IV, VI - Extraocular movements intact. V - Facial sensation intact bilaterally. VII - right facial droop improved. VIII - Hearing & vestibular intact bilaterally. X - Palate elevates symmetrically. XI - Chin turning & shoulder shrug intact bilaterally XII - Tongue protrusion intact.  Motor Strength - The patient's strength was normal in all extremities except subtle right dexterity difficulty and RLE 5-/5 proximal and pronator drift was absent.  Bulk was normal and fasciculations were absent.   Motor Tone - Muscle tone was assessed at the neck and appendages and was normal.  Reflexes - The patient's reflexes were 1+ in all extremities and he had no pathological reflexes.  Sensory - Light touch, temperature/pinprick were assessed and were symmetrical.     Coordination - The patient had normal movements in the hands and feet with no ataxia or dysmetria.  Tremor was absent.  Gait and Station - deferred.   ASSESSMENT/PLAN Mr. Sameul Schrick is a 82 y.o. male with history of hypertension, coronary artery disease, chronic kidney disease, type 2 diabetes mellitus presenting with trouble talking. He did not receive IV t-PA due to unknown onset, LKW.   Probable Orthostatic Hypotension  Secondary to getting OOB to fast to go to bathroom this am  Dizzy when OOB  Worsening of stroke symptoms - trouble swallowing and words out, tearful and anxious  Code stroke called by RN - on assessment, pt without new symptoms. NIHSS=0  CT without acute change, L paracentral brainstem infarct from 2 days ago seen  Patient improved when lying flat  Will check orthostatic vitals  Place TED hose  CTA head and neck - left VA and BA and bilateral ICA siphons and right PCA P2 segment stenoses.  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 L > R narrowing intracranial arteries. Possible L PCA 1.5 mm aneurysm  Carotid Doppler  No significant stenosis   2D Echo  EF 60-65%  LDL canceled, will reorder  HgbA1c canceled, will reorder  Heparin 5000 units sq tid for VTE prophylaxis DIET DYS 3 Room service appropriate? Yes; Fluid consistency: Thin  aspirin 81 mg daily prior to admission, now on aspirin 325 mg and Plavix 75 mg daily.    Due to intracranial stenosis, recommend DAPT for 3 months and then plavix alone.  Patient counseled to be compliant with his antithrombotic medications  Ongoing aggressive stroke risk factor management  Therapy recommendations:  CIR  Disposition:  pending    Hx of SVT  Followed with Dr. Tamala Julian in cardiology in the past  Treated with IV injection  Valsalva  helped to abort episodes  Do not feel needs further cardiac event monitoring as current stroke likely small vessel event and hx of SVT pretty typical.  Hypertensive Emergency  BP 225/102 on arrival in setting of neurologic symptoms  Remains elevated  Permissive hypertension (OK if < 220/120) but gradually normalize in 5-7 days  Long-term BP goal 130-150 due to BA stenosis  Hyperlipidemia  Home meds:  No statin  LDL canceled, will reorder, goal < 70  Hx of lipitor allergy  Put on low dose pravastatin  Continue statin at discharge  DM  A1C canceled, will reorder  SSI  CBG monitoring  Other Stroke Risk Factors  Advanced age  Overweight, Body mass index is 27.75 kg/m., recommend weight loss, diet  and exercise as appropriate   Coronary artery disease - s/p PTCA  PVD s/p fem pop bypass  Other Active Problems  CKD  Hx PE  Tooth extraction - follow up with dentist for antiplatelet management perioperatively  Hospital day # 2    Reconsult  Neurology signed off 10/15/2015  Reconsult requested  ATTENDING NOTE: Patient was seen and examined by me personally. Documentation reflects findings. The laboratory and radiographic studies reviewed by me. ROS completed by me personally and pertinent positives fully documented  Condition:  worsened  Assessment and plan completed by me personally and fully documented above. Plans/Recommendations include:     STAT diffusion MRI of head:  There is moderate worsening of the LEFT paramedian pontine area of restricted diffusion representing acute infarction, as compared with 10/14/2015. There is slight increase in dorsal extension towards the floor of the fourth ventricle, there is more lateral extension towards the brachium pontis, and there is more craniocaudal extension towards the midbrain and inferior pons. No contralateral extension. No areas of cerebellar or cerebral hemispheric acute infarction are  observed.  Checking Plavix assay to determine if responder  Checking orthostatics  Recommend liberalizing BP for now; held cozaar for now  Discussed with primary team  SIGNED BY: Dr. Elissa Hefty       To contact Stroke Continuity provider, please refer to [email or web address] After hours, contact General Neurology

## 2015-10-18 DIAGNOSIS — R1310 Dysphagia, unspecified: Secondary | ICD-10-CM

## 2015-10-18 LAB — GLUCOSE, CAPILLARY
GLUCOSE-CAPILLARY: 123 mg/dL — AB (ref 65–99)
GLUCOSE-CAPILLARY: 125 mg/dL — AB (ref 65–99)
GLUCOSE-CAPILLARY: 129 mg/dL — AB (ref 65–99)
GLUCOSE-CAPILLARY: 149 mg/dL — AB (ref 65–99)
GLUCOSE-CAPILLARY: 161 mg/dL — AB (ref 65–99)
Glucose-Capillary: 124 mg/dL — ABNORMAL HIGH (ref 65–99)

## 2015-10-18 LAB — HEMOGLOBIN A1C
HEMOGLOBIN A1C: 7.3 % — AB (ref 4.8–5.6)
MEAN PLASMA GLUCOSE: 163 mg/dL

## 2015-10-18 MED ORDER — POLYETHYLENE GLYCOL 3350 17 G PO PACK
17.0000 g | PACK | Freq: Every day | ORAL | Status: DC
  Administered 2015-10-18 – 2015-10-21 (×3): 17 g via ORAL
  Filled 2015-10-18 (×3): qty 1

## 2015-10-18 MED ORDER — BISACODYL 10 MG RE SUPP: 10.0000 mg | Freq: Every day | RECTAL | Status: DC | PRN

## 2015-10-18 MED ORDER — DOCUSATE SODIUM 100 MG PO CAPS
100.0000 mg | ORAL_CAPSULE | Freq: Two times a day (BID) | ORAL | Status: DC
  Administered 2015-10-18 – 2015-10-21 (×6): 100 mg via ORAL
  Filled 2015-10-18 (×6): qty 1

## 2015-10-18 NOTE — Progress Notes (Addendum)
STROKE TEAM PROGRESS NOTE   HPI: This is an 82-yo RH man who presented to [facility] today after experiencing difficulty speaking for the past 2-3 days. History is obtained directly from the patient who is an excellent historian. I have also reviewed his chart at length.   He reports that approximately 3 days ago, he noticed that he was having some difficulty with his speech. He describes that he has been stumbling over words and at times cannot get them out appropriately. His symptoms have remained stable since he first noticed them. He initially denied any additional symptoms. However, further in conversation, he recalled that he was having difficulty moving his right leg from the gas to the brake when he was driving 2 days ago. I notice in the emergency department physician's note that it was mentioned that his PCP felt he was favoring his right leg while walking. The patient tells me that this has been a long-standing issue for the past several years and he does not feel like his walking has been any different over the past couple of days. The only other symptom that he reports is a crawling sensation behind his left ear that extends into the left occipital region. This is intermittent and appeared a few days before his speech deficits. He denies any headache or pain in his neck. He denies any vision loss, double vision, vertigo, hearing changes, difficulty swallowing, numbness, weakness, balance problems, or gait changes.  He had no intention of coming to the hospital to be evaluated. However, he states that this morning a family member felt like his lips looked blue so they took him to his primary care physician. During this visit, his PCP noted that he was having difficulty with his speech and sent him to the emergency department. Her graph of note, the patient typically takes a baby aspirin every day. However, he stopped taking this on 10/11/15 due to some upcoming dental work.   SUBJECTIVE  (INTERVAL HISTORY) Initially, grand-daughter at the bedside and later joined by daughter and great-grand-daughter at bedside. This am he reports feeling stronger and having less difficulty with eating.  His RN was at the bedside and described patient's walk with assistance today.  Apparently as compared to previous, patient needs more assistance since the extension of his stroke.  I explained to family that patient will be reassessed and the needs for rehab determined based on new exam.  Also discussed the fact that patient had a tendency to have "blue mood"    OBJECTIVE Temp:  [97.4 F (36.3 C)-98.3 F (36.8 C)] 97.8 F (36.6 C) (08/20 0423) Pulse Rate:  [59-80] 60 (08/20 0423) Cardiac Rhythm: Normal sinus rhythm (08/20 0100) Resp:  [12-18] 18 (08/20 0423) BP: (158-184)/(70-95) 176/74 (08/20 0423) SpO2:  [92 %-98 %] 93 % (08/20 0423)  CBC:   Recent Labs Lab 10/14/15 1255 10/14/15 1307 10/17/15 0551  WBC 8.6  --  8.4  NEUTROABS 5.8  --   --   HGB 17.1* 17.7* 16.6  HCT 50.3 52.0 49.2  MCV 89.0  --  89.6  PLT 187  --  0000000    Basic Metabolic Panel:   Recent Labs Lab 10/14/15 1255 10/14/15 1307 10/17/15 0551  NA 138 140 135  K 4.5 4.6 3.9  CL 104 102 105  CO2 27  --  22  GLUCOSE 127* 124* 133*  BUN 25* 27* 21*  CREATININE 1.45* 1.50* 1.49*  CALCIUM 9.8  --  9.3  Lipid Panel:     Component Value Date/Time   CHOL 207 (H) 10/17/2015 0551   TRIG 159 (H) 10/17/2015 0551   HDL 34 (L) 10/17/2015 0551   CHOLHDL 6.1 10/17/2015 0551   VLDL 32 10/17/2015 0551   LDLCALC 141 (H) 10/17/2015 0551   HgbA1c: No results found for: HGBA1C Urine Drug Screen:     Component Value Date/Time   LABOPIA NONE DETECTED 10/14/2015 1250   COCAINSCRNUR NONE DETECTED 10/14/2015 1250   LABBENZ NONE DETECTED 10/14/2015 1250   AMPHETMU NONE DETECTED 10/14/2015 1250   THCU NONE DETECTED 10/14/2015 1250   LABBARB NONE DETECTED 10/14/2015 1250      IMAGING I have personally reviewed  the radiological images below and agree with the radiology interpretations.  Mr Brain Wo Contrast 10/14/2015 Acute infarct in the left pons. Generalized atrophy with mild chronic microvascular ischemia in the white matter.   Mr Jodene Nam Head/brain Wo Cm 10/14/2015 1. Moderate narrowing of the midportion of the basilar artery, likely secondary to atherosclerotic disease. 2. Multifocal moderate to severe narrowing of the intracranial internal carotid arteries, left worse than right. 3. Possible 1.5 mm aneurysm versus atherosclerotic irregularity at the left posterior communicating artery origin.   Carotid Doppler   There is 1-39% bilateral ICA stenosis. Vertebral artery flow is antegrade.    TTE - Left ventricle: The cavity size was normal. Systolic function was normal. The estimated ejection fraction was in the range of 60% to 65%. Wall motion was normal; there were no regional wall motionbnormalities. Doppler parameters are consistent with abnormal left ventricular relaxation (grade 1 diastolic dysfunction). Doppler parameters are consistent with indeterminate ventricular filling pressure. - Aortic valve: Transvalvular velocity was within the normal range. There was no stenosis. There was mild regurgitation. - Mitral valve: Transvalvular velocity was within the normal range. There was no evidence for stenosis. There was mild regurgitation. - Right ventricle: The cavity size was normal. Wall thickness was normal. Systolic function was normal. - Atrial septum: No defect or patent foramen ovale was identified by color flow Doppler. - Tricuspid valve: There was no regurgitation.  Ct Head Code Stroke Wo Contrast 10/16/2015 1. Expected CT appearance of the left paracentral brainstem infarct seen by MRI 2 days ago. No associated hemorrhage or mass effect.  2. No superimposed acute cortically based infarct or new intracranial abnormality.  3. ASPECTS is 10.   CT angio Head and Neck 10/16/2015 1. Negative  for intracranial emergent large vessel occlusion. However, the left vertebral artery is severely atherosclerotic throughout its course and is intermittently occluded in the left V4 segment. And there is moderate to severe irregularity and stenosis of the proximal basilar artery which appears somewhat more pronounced than on the MRA yesterday. 2. Additionally there is moderate to severe intracranial atherosclerosis of the bilateral ICA siphons and right PCA P2 segment. 3. Abundant soft plaque in both cervical carotid arteries, with some areas of low-density soft plaque noted. Stenosis up to 55% occurs. 4. Mild to moderate stenosis of the origin of the dominant right vertebral artery.    PHYSICAL EXAM  Temp:  [97.4 F (36.3 C)-98.3 F (36.8 C)] 97.8 F (36.6 C) (08/20 0423) Pulse Rate:  [59-80] 60 (08/20 0423) Resp:  [12-18] 18 (08/20 0423) BP: (158-184)/(70-95) 176/74 (08/20 0423) SpO2:  [92 %-98 %] 93 % (08/20 0423)  General - Well nourished, well developed, in no apparent distress.  Cardiovascular - Regular rate and rhythm.  Pulmonary- CTA  Abdomen:  Soft NT, ND  Extrem:  No C/C/E  Mental Status -  Level of arousal and orientation to time, place, and person were intact. Flat affect. Language including expression, naming, repetition, comprehension was assessed and found intact. Fund of Knowledge was assessed and was intact. + more dysarthria today  Cranial Nerves II - XII - II - Visual field intact OU. III, IV, VI - Extraocular movements intact. V - Facial sensation intact bilaterally. VII - right facial droop improved. VIII - Hearing & vestibular intact bilaterally. X - Palate elevates symmetrically. XI - Chin turning & shoulder shrug intact bilaterally XII - Tongue protrusion intact.  Motor Strength - The patient's strength was normal in all extremities except subtle right dexterity difficulty and RLE 5-/5 proximal and pronator drift was absent.  Bulk was normal and  fasciculations were absent.   Motor Tone - Muscle tone was assessed at the neck and appendages and was normal.  Sensory - Light touch, temperature/pinprick were assessed and were symmetrical.    Coordination - The patient had normal movements in the hands and feet with no ataxia or dysmetria.  Tremor was absent.  Gait and Station - deferred.   ASSESSMENT/PLAN [name] is a 82 y.o. male with history of hypertension, coronary artery disease, chronic kidney disease, type 2 diabetes mellitus presenting with trouble talking. He did not receive IV t-PA due to unknown onset, LKW.   Probable Orthostatic Hypotension  Secondary to getting OOB to fast to go to bathroom this am  Dizzy when OOB  Worsening of stroke symptoms - trouble swallowing and words out, tearful and anxious  Code stroke called by RN - on assessment, pt without new symptoms. NIHSS=0  CT without acute change, L paracentral brainstem infarct from 2 days ago seen  Patient improved when lying flat  Will check orthostatic vitals  Place TED hose  CTA head and neck - left VA and BA and bilateral ICA siphons and right PCA P2 segment stenoses.  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 L > R narrowing intracranial arteries. Possible L PCA 1.5 mm aneurysm  Carotid Doppler  No significant stenosis   2D Echo  EF 60-65%  LDL canceled, will reorder  HgbA1c canceled, will reorder  Heparin 5000 units sq tid for VTE prophylaxis DIET DYS 3 Room service appropriate? Yes; Fluid consistency: Thin  aspirin 81 mg daily prior to admission, now on aspirin 325 mg and Plavix 75 mg daily.    Due to intracranial stenosis, recommend DAPT for 3 months and then plavix alone.  Patient counseled to be compliant with his antithrombotic medications  Ongoing aggressive  stroke risk factor management  Therapy recommendations:  CIR  Disposition:  pending    Hx of SVT  Followed with Dr. Tamala Julian in cardiology in the past  Treated with IV injection  Valsalva helped to abort episodes  Do not feel needs further cardiac event monitoring as current stroke likely small vessel event and hx of SVT pretty typical.  Hypertensive Emergency  BP 225/102 on arrival in setting of neurologic symptoms  Remains elevated  Permissive hypertension (OK if < 220/120) but gradually normalize in 5-7 days  Long-term BP goal 130-150 due to BA stenosis  Hyperlipidemia  Home meds:  No statin  LDL canceled, will reorder, goal < 70  Hx of lipitor allergy  Put on low dose  pravastatin  Continue statin at discharge  DM  A1C canceled, will reorder  SSI  CBG monitoring  Other Stroke Risk Factors  Advanced age  Overweight, Body mass index is 27.75 kg/m., recommend weight loss, diet and exercise as appropriate   Coronary artery disease - s/p PTCA  PVD s/p fem pop bypass  Other Active Problems  CKD  Hx PE  Tooth extraction - follow up with dentist for antiplatelet management perioperatively  Hospital day # 3    Reconsult  Neurology signed off 10/15/2015  Reconsult requested  ATTENDING NOTE: Patient was seen and examined by me personally. Documentation reflects findings. The laboratory and radiographic studies reviewed by me. ROS completed by me personally and pertinent positives fully documented  Condition:  worsened  Assessment and plan completed by me personally and fully documented above. Plans/Recommendations include:     STAT diffusion MRI of head on 10/17/2015:  There is moderate worsening of the LEFT paramedian pontine area of restricted diffusion representing acute infarction, as compared with 10/14/2015. There is slight increase in dorsal extension towards the floor of the fourth ventricle, there is more lateral extension towards the  brachium pontis, and there is more craniocaudal extension towards the midbrain and inferior pons. No contralateral extension. No areas of cerebellar or cerebral hemispheric acute infarction are observed.  I discussed the MRI with grand-daughter and patient.  Checked Plavix assay to determine if responder.  Value was 138 however, lab indicated hemolysis of sample.  Will contact lab to determine recommendation regarding redraw or lab interpretation  Checking orthostatics-  Discussed with RN and he will check them now  Recommend liberalizing BP for now; hold cozaar for now.  Consider normotension goals after acute stroke period >72 hours after extension  SIGNED BY: Dr. Elissa Hefty    Addendum:  Follow-up with lab.  First dram of the Plavix assay had hemolysis but the second draw did not and per the lab may be used for interpretation    To contact Stroke Continuity provider, please refer to [email or web address] After hours, contact General Neurology

## 2015-10-18 NOTE — Progress Notes (Signed)
PROGRESS NOTE    [name]  [id number] DOB: [birth year] DOA: 10/14/2015 PCP: Mathews Argyle, MD   Brief Narrative:  [name] is a pleasant 82 year old gentleman with a past medical history of coronary artery disease, hypertension, chronic disease, diabetes mellitus admitted to medicine service on 10/14/2015 when he presented with complaints of slurred speech, workup included MRI of brain that revealed an acute infarct in the left pons.    Assessment & Plan:   Principal Problem:   CVA (cerebral vascular accident) [name]) Active Problems:   Peripheral vascular disease, unspecified ([place])   CVA (cerebral infarction)   Accelerated hypertension   Type 2 diabetes mellitus ([place])   HLD (hyperlipidemia)   Essential hypertension   Orthostatic hypotension   Dysphagia  1.  Acute CVA -[name] presenting with a 3 day history of dysarthria, workup included MRI of brain showing an acute CVA involving left pons. He was evaluated by neurology overnight. -Carotid Dopplers showed 1-39% stenosis involving right and left internal carotid arteries, transthoracic echocardiogram showing EF of 60-65% with grade 1 diastolic dysfunction -Continue antiplatelet therapy with aspirin -10/16/2015 patient reporting difficulty swallowing, having concerns for worsening focal deficits Code CVA was called as he underwent stat CT scan of head. Radiology did not report new intracranial abnormality. CTA showing the left vertebral artery severely atherosclerotic with moderate to severe irregularity and stenosis of the proximal basilar artery -Spoke with Neurology will treat with dual agent antiplatelet therapy with ASA and Plavix. -On 10/17/2015 he reported difficulties swallowing, presentation similar to 10/16/2015. SLP and Neurology re-consulted. SLP recommended barium swallow.  -Repeat MRI on 10/17/2015 showing extension of acute left pontine CVA, spoke with neurology recommending continuing dual agent antiplatelet  therapy.  -Plan for CIR when neurologically stable  -10/18/2015 Pending barium swallow   2.  Hypertension. -Patient presented with stroke, allowing for permissive hypertension, breaking blood pressures down slowly.  -Will increase Cozaar to 100 mg by mouth dail and continue metoprolol at 25 mg by mouth twice a day, as we work to gradually bring down blood pressures  3.  Chronic kidney disease  -Lab work showing creatinine of 1.5   DVT prophylaxis: subcutaneous heparin  Code Status: Full Code  Family Communication: Spoke with his wife was present at bedside Disposition Plan: Plan for inpatient rehabilitation  Consultants:   Neurology   Procedures:   Transthoracic echocardiogram performed on 10/15/2015 Impression: - Left ventricle: The cavity size was normal. Systolic function was   normal. The estimated ejection fraction was in the range of 60%   to 65%. Wall motion was normal; there were no regional wall   motion abnormalities. Doppler parameters are consistent with   abnormal left ventricular relaxation (grade 1 diastolic   dysfunction). Doppler parameters are consistent with   indeterminate ventricular filling pressure.  Subjective: [name] reports feeling about the same, ambulated down the hallway today.   Objective: Vitals:   10/17/15 1823 10/17/15 2017 10/18/15 0007 10/18/15 0423  BP: (!) 158/95 (!) 184/70 (!) 166/70 (!) 176/74  Pulse: 74 65 (!) 59 60  Resp: 12 18 18 18   Temp: 98.3 F (36.8 C) 97.4 F (36.3 C) 97.7 F (36.5 C) 97.8 F (36.6 C)  TempSrc:      SpO2: 98% 92% 96% 93%  Weight:      Height:        Intake/Output Summary (Last 24 hours) at 10/18/15 1310 Last data filed at 10/18/15 0900  Gross per 24 hour  Intake  130 ml  Output                0 ml  Net              130 ml   Filed Weights   10/14/15 1233  Weight: 80.4 kg (177 lb 3.2 oz)    Examination:  General exam: NAD Respiratory system: Clear to auscultation. Respiratory  effort normal. Cardiovascular system: S1 & S2 heard, RRR. No JVD, murmurs, rubs, gallops or clicks. No pedal edema. Gastrointestinal system: Abdomen is nondistended, soft and nontender. No organomegaly or masses felt. Normal bowel sounds heard. Central nervous system: There is mild right sided facial droop, along with 4/5 muscle strength to right upper and right lower extremities.  Extremities: Symmetric 5 x 5 power. Skin: No rashes, lesions or ulcers Psychiatry: has mild dysarthria  Data Reviewed: I have personally reviewed following labs and imaging studies  CBC:  Recent Labs Lab 10/14/15 1255 10/14/15 1307 10/17/15 0551  WBC 8.6  --  8.4  NEUTROABS 5.8  --   --   HGB 17.1* 17.7* 16.6  HCT 50.3 52.0 49.2  MCV 89.0  --  89.6  PLT 187  --  0000000   Basic Metabolic Panel:  Recent Labs Lab 10/14/15 1255 10/14/15 1307 10/17/15 0551  NA 138 140 135  K 4.5 4.6 3.9  CL 104 102 105  CO2 27  --  22  GLUCOSE 127* 124* 133*  BUN 25* 27* 21*  CREATININE 1.45* 1.50* 1.49*  CALCIUM 9.8  --  9.3   GFR: Estimated Creatinine Clearance: 36.1 mL/min (by C-G formula based on SCr of 1.49 mg/dL). Liver Function Tests:  Recent Labs Lab 10/14/15 1255  AST 71*  ALT 103*  ALKPHOS 53  BILITOT 0.7  PROT 8.4*  ALBUMIN 4.8   No results for input(s): LIPASE, AMYLASE in the last 168 hours. No results for input(s): AMMONIA in the last 168 hours. Coagulation Profile:  Recent Labs Lab 10/14/15 1255  INR 1.01   Cardiac Enzymes: No results for input(s): CKTOTAL, CKMB, CKMBINDEX, TROPONINI in the last 168 hours. BNP (last 3 results) No results for input(s): PROBNP in the last 8760 hours. HbA1C:  Recent Labs  10/17/15 0551  HGBA1C 7.3*   CBG:  Recent Labs Lab 10/17/15 2011 10/18/15 0006 10/18/15 0422 10/18/15 0756 10/18/15 1141  GLUCAP 195* 123* 129* 125* 124*   Lipid Profile:  Recent Labs  10/17/15 0551  CHOL 207*  HDL 34*  LDLCALC 141*  TRIG 159*  CHOLHDL 6.1    Thyroid Function Tests: No results for input(s): TSH, T4TOTAL, FREET4, T3FREE, THYROIDAB in the last 72 hours. Anemia Panel: No results for input(s): VITAMINB12, FOLATE, FERRITIN, TIBC, IRON, RETICCTPCT in the last 72 hours. Sepsis Labs: No results for input(s): PROCALCITON, LATICACIDVEN in the last 168 hours.  No results found for this or any previous visit (from the past 240 hour(s)).       Radiology Studies: [facility] Brain Ltd W/o Cm  Result Date: 10/17/2015 CLINICAL DATA:  Worsening aphasia and numbness with tingling of the LEFT side of the face. EXAM: MRI HEAD WITHOUT CONTRAST TECHNIQUE: Diffusion and ADC sequences of the brain and surrounding structures were obtained without intravenous contrast. COMPARISON:  MRI brain 10/14/2015.  CTA head neck 10/16/2015. FINDINGS: Only diffusion-weighted imaging was requested. There is moderate worsening of the LEFT paramedian pontine area of restricted diffusion representing acute infarction, as compared with 10/14/2015. There is slight increase in dorsal extension towards the floor  of the fourth ventricle, there is more lateral extension towards the brachium pontis, and there is more craniocaudal extension towards the midbrain and inferior pons. No contralateral extension. No areas of cerebellar or cerebral hemispheric acute infarction are observed. IMPRESSION: Moderate worsening of acute LEFT paramedian pontine infarction as compared with 10/14/2015. Electronically Signed   By: Staci Righter M.D.   On: 10/17/2015 15:47        Scheduled Meds: . amoxicillin  500 mg Oral Q8H  . aspirin  300 mg Rectal Daily   Or  . aspirin  325 mg Oral Daily  . clopidogrel  75 mg Oral Daily  . docusate sodium  100 mg Oral BID  . heparin  5,000 Units Subcutaneous Q8H  . insulin aspart  0-9 Units Subcutaneous Q4H  . metoprolol tartrate  25 mg Oral BID  . polyethylene glycol  17 g Oral Daily  . pravastatin  20 mg Oral q1800   Continuous Infusions:    LOS: 3  days    Time spent: 25 min    Kelvin Cellar, MD [facility] Pager [phone]  If 7PM-7AM, please contact night-coverage [email or web address] Password [email or web address] 10/18/2015, 1:10 PM

## 2015-10-19 DIAGNOSIS — R4701 Aphasia: Secondary | ICD-10-CM

## 2015-10-19 LAB — VAS US CAROTID
LEFT ECA DIAS: 0 cm/s
LEFT VERTEBRAL DIAS: 2 cm/s
LICAPDIAS: -16 cm/s
LICAPSYS: -55 cm/s
Left CCA dist dias: -11 cm/s
Left CCA dist sys: -53 cm/s
Left CCA prox dias: 8 cm/s
Left CCA prox sys: 69 cm/s
RCCAPDIAS: 7 cm/s
RCCAPSYS: 40 cm/s
RIGHT ECA DIAS: -7 cm/s
RIGHT VERTEBRAL DIAS: 10 cm/s
Right cca dist sys: -76 cm/s

## 2015-10-19 LAB — GLUCOSE, CAPILLARY
GLUCOSE-CAPILLARY: 111 mg/dL — AB (ref 65–99)
GLUCOSE-CAPILLARY: 116 mg/dL — AB (ref 65–99)
GLUCOSE-CAPILLARY: 185 mg/dL — AB (ref 65–99)
GLUCOSE-CAPILLARY: 187 mg/dL — AB (ref 65–99)
Glucose-Capillary: 138 mg/dL — ABNORMAL HIGH (ref 65–99)

## 2015-10-19 NOTE — Progress Notes (Signed)
Speech Language Pathology Treatment: Dysphagia  Patient Details Name: [name] MRN: [id number] DOB: [birth year] Today's Date: 10/19/2015 Time: [id number] SLP Time Calculation (min) (ACUTE ONLY): 24 min  Assessment / Plan / Recommendation Clinical Impression  ST follow up for therapeutic diet tolerance due to worsening symptoms.  Chart review was completed and the patient has been afebrile and lungs have been clear.  Nursing reporting very inconsistent cough with intake.  Patient continues to complain of symptoms consistent with esophageal dysphagia.  In addition, the patient has two abscessed teeth that require removal.  Question if some of the patient's discomfort that is seen while he is swallowing is related to pain from the abscessed teeth.  Meal observation was completed using dry solids, pureed material and thin liquids via self fed cup and straw sips.  No obvious issues were noted except for discomfort while swallowing.  Recommend continue with a dysphagia 3 diet with thin liquids.  Meds crushed in puree.  Alternate food and liquids and sit up for 30-60 minutes after meals.  Consider regular barium swallow to fully assess esophageal function given the patient's complaints and history of esophageal tear.   HPI HPI: Pt is an 82 y/o m admitted due to neuro changes including "stumbling over words". He has been diagnosed with paramedian pons stroke.BSE 8/17 revealed functional and safe swallow. Dys 3/thin recommended due to dental pain. 8/18 pt complained of difficulty speaking, swallowing and pocketing of food. Order to reassess swallow.  Question possible extension of CVA due to worsening symptoms.        SLP Plan  Continue with current plan of care     Recommendations  Diet recommendations: Dysphagia 3 (mechanical soft);Thin liquid Liquids provided via: Cup;Straw Medication Administration: Crushed with puree Supervision: Patient able to self feed;Intermittent supervision to cue for  compensatory strategies Compensations: Slow rate;Small sips/bites;Lingual sweep for clearance of pocketing;Other (Comment) (alternate food/liquids) Postural Changes and/or Swallow Maneuvers: Seated upright 90 degrees;Upright 30-60 min after meal             General recommendations: Rehab consult Oral Care Recommendations: Oral care BID Follow up Recommendations: Inpatient Rehab Plan: Continue with current plan of care     [place]es, MA, CCC-SLP Acute Rehab SLP [phone] Lamar Sprinkles 10/19/2015, 10:40 AM

## 2015-10-19 NOTE — Clinical Social Work Note (Signed)
Clinical Social Work Assessment  Patient Details  Name: [name] MRN: [id number] Date of Birth: [birth year]  Date of referral:  10/19/15               Reason for consult:  Facility Placement                Permission sought to share information with:  Family Supports, Chartered certified accountant granted to share information::  Yes, Verbal Permission Granted  Name::        Agency::  SNFs  Relationship::  dtr  Contact Information:     Housing/Transportation Living arrangements for the past 2 months:  Single Family Home Source of Information:  Patient, Adult Children Patient Interpreter Needed:  None Criminal Activity/Legal Involvement Pertinent to Current Situation/Hospitalization:  No - Comment as needed Significant Relationships:  Adult Children Lives with:  Self Do you feel safe going back to the place where you live?  No Need for family participation in patient care:  No (Coment)  Care giving concerns: pt needing intensive rehab to recovery from stroke- lives alone and needs to be more mobile to be safe at home.   Social Worker assessment / plan: CSW spoke with pt and pt dtr at bedside concerning PT recommendation for rehab.  CSW discussed CIR vs SNF and explained recommendation for SNF if CIR not an option.  Employment status:  Retired Nurse, adult PT Recommendations:  Inpatient [place] / Referral to community resources:  [place]  Patient/Family's Response to care:  Pt and pt dtr very hopeful for CIR and want that as their first choice even if paying privately is the option- agreeable to SNF search but don't want to pursue that option.  Patient/Family's Understanding of and Emotional Response to Diagnosis, Current Treatment, and Prognosis:  Pt very frustrated by slow progress and became upset when couldn't think of the appropriate words to say- pt very motivated to recovery functioning.  Emotional  Assessment Appearance:  Appears stated age Attitude/Demeanor/Rapport:    Affect (typically observed):  Appropriate, Frustrated Orientation:  Oriented to Self, Oriented to Place, Oriented to  Time, Oriented to Situation Alcohol / Substance use:  Not Applicable Psych involvement (Current and /or in the community):  No (Comment)  Discharge Needs  Concerns to be addressed:  Care Coordination Readmission within the last 30 days:  No Current discharge risk:  Physical Impairment Barriers to Discharge:  Continued Medical Work up   Jorge Ny, LCSW 10/19/2015, 10:23 AM

## 2015-10-19 NOTE — Progress Notes (Signed)
Rehab admissions - I have called and opened the case with [facility] medicare requesting acute inpatient rehab admission.  I will update all once I hear back from insurance case manager.  Call me for questions.  [phone]

## 2015-10-19 NOTE — Progress Notes (Signed)
STROKE TEAM PROGRESS NOTE   SUBJECTIVE (INTERVAL HISTORY) Doing well overall. No new complaints.    OBJECTIVE Temp:  [97.5 F (36.4 C)-97.8 F (36.6 C)] 97.5 F (36.4 C) (08/21 1100) Pulse Rate:  [60-89] 89 (08/21 1122) Cardiac Rhythm: Normal sinus rhythm (08/21 0815) Resp:  [18-20] 20 (08/21 1100) BP: (170-198)/(70-90) 172/78 (08/21 1122) SpO2:  [94 %-97 %] 95 % (08/21 0429)  CBC:   Recent Labs Lab 10/14/15 1255 10/14/15 1307 10/17/15 0551  WBC 8.6  --  8.4  NEUTROABS 5.8  --   --   HGB 17.1* 17.7* 16.6  HCT 50.3 52.0 49.2  MCV 89.0  --  89.6  PLT 187  --  0000000   Basic Metabolic Panel:   Recent Labs Lab 10/14/15 1255 10/14/15 1307 10/17/15 0551  NA 138 140 135  K 4.5 4.6 3.9  CL 104 102 105  CO2 27  --  22  GLUCOSE 127* 124* 133*  BUN 25* 27* 21*  CREATININE 1.45* 1.50* 1.49*  CALCIUM 9.8  --  9.3   Lipid Panel:     Component Value Date/Time   CHOL 207 (H) 10/17/2015 0551   TRIG 159 (H) 10/17/2015 0551   HDL 34 (L) 10/17/2015 0551   CHOLHDL 6.1 10/17/2015 0551   VLDL 32 10/17/2015 0551   LDLCALC 141 (H) 10/17/2015 0551   HgbA1c:  Lab Results  Component Value Date   HGBA1C 7.3 (H) 10/17/2015   Urine Drug Screen:     Component Value Date/Time   LABOPIA NONE DETECTED 10/14/2015 1250   COCAINSCRNUR NONE DETECTED 10/14/2015 1250   LABBENZ NONE DETECTED 10/14/2015 1250   AMPHETMU NONE DETECTED 10/14/2015 1250   THCU NONE DETECTED 10/14/2015 1250   LABBARB NONE DETECTED 10/14/2015 1250      IMAGING MR Brain 8/19 moderate worsening of acute left paramedian pontine infarction as compared to 10/14/2015 per radiology report. Upon review, does not appear to be worsened.     PHYSICAL EXAM General - Well nourished, well developed, in no apparent distress.  Cardiovascular - Regular rate and rhythm.  Pulmonary- CTA  Abdomen:  Soft NT, ND  Extrem:  No C/C/E  Mental Status -  Level of arousal and orientation to time, place, and person were  intact. Flat affect. Language including expression, naming, repetition, comprehension was assessed and found intact. Fund of Knowledge was assessed and was intact. + more dysarthria today  Cranial Nerves II - XII - II - Visual field intact OU. III, IV, VI - Extraocular movements intact. V - Facial sensation intact bilaterally. VII - right facial droop improved. VIII - Hearing & vestibular intact bilaterally. X - Palate elevates symmetrically. XI - Chin turning & shoulder shrug intact bilaterally XII - Tongue protrusion intact.  Motor Strength - The patient's strength was normal in all extremities except subtle right dexterity difficulty and RLE 5-/5 proximal and pronator drift was absent.  Bulk was normal and fasciculations were absent.   Motor Tone - Muscle tone was assessed at the neck and appendages and was normal.  Sensory - Light touch, temperature/pinprick were assessed and were symmetrical.    Coordination - The patient had normal movements in the hands and feet with no ataxia or dysmetria.  Tremor was absent.  Gait and Station - deferred.   ASSESSMENT/PLAN [name] is a 82 y.o. male with history of hypertension, coronary artery disease, chronic kidney disease, type 2 diabetes mellitus presenting with trouble talking. He did not receive IV t-PA due  to unknown onset, LKW.   Probable Orthostatic Hypotension  Secondary to getting OOB to fast to go to bathroom this am  Dizzy when OOB  Worsening of stroke symptoms - trouble swallowing and words out, tearful and anxious  Code stroke called by RN - on assessment, pt without new symptoms. NIHSS=0  CT without acute change, L paracentral brainstem infarct from 2 days ago seen  Patient improved when lying flat  Will check orthostatic vitals  Place TED hose  CTA head and neck - left VA and BA and bilateral ICA siphons and right PCA P2 segment stenoses.  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 L > R narrowing intracranial arteries. Possible L PCA 1.5 mm aneurysm  Carotid Doppler  No significant stenosis   2D Echo  EF 60-65%  LDL 141  HgbA1c 7.3  Heparin 5000 units sq tid for VTE prophylaxis DIET DYS 3 Room service appropriate? Yes; Fluid consistency: Thin  aspirin 81 mg daily prior to admission, now on aspirin 325 mg and Plavix 75 mg daily.    Due to intracranial stenosis, recommend DAPT for 3 months and then plavix alone.  P2y12 138   Patient counseled to be compliant with his antithrombotic medications  Ongoing aggressive stroke risk factor management  Therapy recommendations:  CIR. Admissions coordinator following - awaiting U[employer] approval  Disposition:  pending    Hx of SVT  Followed with Dr. Tamala Julian in cardiology in the past  Treated with IV injection  Valsalva helped to abort episodes  Do not feel needs further cardiac event monitoring as current stroke likely small vessel event and hx of SVT pretty typical.  Hypertensive Emergency  BP 225/102 on arrival in setting of neurologic symptoms  Remains elevated  Permissive hypertension (OK if < 220/120) but gradually normalize in 5-7 days  Long-term BP goal 130-150 due to BA stenosis Recommend liberalizing BP for now; hold cozaar for now.  Consider normotension goals after acute stroke period >72 hours after extension  Hyperlipidemia  Home meds:  No statin  LDL 141, goal < 70  Hx of lipitor allergy  Put on low dose pravastatin  Continue statin at discharge  Diabetes type II  A1C 7.3  SSI  CBG monitoring  Other Stroke Risk Factors  Advanced age  Overweight, Body mass index is 27.75 kg/m., recommend weight loss, diet and exercise as appropriate   Coronary artery disease - s/p PTCA  PVD s/p fem pop bypass  Other Active Problems  CKD  Hx  PE  Tooth extraction - follow up with dentist for antiplatelet management perioperatively  Hospital day # 4  No further workup from neurology standpoint. Please page if further questions arise. .  Discussed with Dr. Shellia Carwin, MD  Internal Medicine PGY-3 I have personally examined this patient, reviewed notes, independently viewed imaging studies, participated in medical decision making and plan of care. I have made any additions or clarifications directly to the above note. Agree with note above. Discussed with daughter at the bedside and answered questions. Greater than 50% of time during this 25 minute visit was spent on counseling and coordination of care about stroke risk, recurrence and treatment and answering questions. Stroke team will sign off. Kindly call for questions  Antony Contras, MD Medical Director [facility] Pager: [phone] 10/19/2015  4:37 PM   To contact Stroke Continuity provider, please refer to [email or web address] After hours, contact General Neurology

## 2015-10-19 NOTE — NC FL2 (Signed)
[place] LEVEL OF CARE SCREENING TOOL     IDENTIFICATION  Patient Name: [name] Birthdate: August 16, 1927 Sex: male Admission Date (Current Location): 10/14/2015  [facility] and [place] Number:  [employer] and Address:  [facility] [place] [address] [place], [place] [address]      Provider Number: [id number]  Attending Physician Name and Address:  Kelvin Cellar, MD  Relative Name and Phone Number:       Current Level of Care: Hospital Recommended Level of Care: [place] Prior Approval Number:    Date Approved/Denied:   PASRR Number: [id number]  Discharge Plan: SNF    Current Diagnoses: Patient Active Problem List   Diagnosis Date Noted  . Dysphagia   . HLD (hyperlipidemia)   . Essential hypertension   . Orthostatic hypotension   . CVA (cerebral vascular accident) ([place]) 10/14/2015  . CVA (cerebral infarction) 10/14/2015  . Accelerated hypertension 10/14/2015  . Type 2 diabetes mellitus ([place]) 10/14/2015  . Peripheral vascular disease, unspecified ([place]) 06/04/2013    Orientation RESPIRATION BLADDER Height & Weight     Self, Time, Situation, Place  Normal Continent Weight: 80.4 kg (177 lb 3.2 oz) Height:  5\' 7"  (170.2 cm)  BEHAVIORAL SYMPTOMS/MOOD NEUROLOGICAL BOWEL NUTRITION STATUS      Continent Diet  AMBULATORY STATUS COMMUNICATION OF NEEDS Skin   Limited Assist Verbally Normal                       Personal Care Assistance Level of Assistance  Bathing, Dressing Bathing Assistance: Limited assistance   Dressing Assistance: Limited assistance     Functional Limitations Info             SPECIAL CARE FACTORS FREQUENCY  PT (By licensed PT), OT (By licensed OT)     PT Frequency: 5/wk OT Frequency: 5/wk            Contractures      Additional Factors Info  Code Status, Allergies, Insulin Sliding Scale Code Status Info: FULL Allergies Info: NKA   Insulin Sliding Scale Info:  6/day       Current Medications (10/19/2015):  This is the current hospital active medication list Current Facility-Administered Medications  Medication Dose Route Frequency Provider Last Rate Last Dose  . acetaminophen (TYLENOL) tablet 650 mg  650 mg Oral Q6H PRN Kelvin Cellar, MD   650 mg at 10/15/15 1851  . amoxicillin (AMOXIL) 250 MG/5ML suspension 500 mg  500 mg Oral Q8H Kelvin Cellar, MD   500 mg at 10/19/15 0600  . aspirin suppository 300 mg  300 mg Rectal Daily Lavina Hamman, MD   300 mg at 10/14/15 2305   Or  . aspirin tablet 325 mg  325 mg Oral Daily Lavina Hamman, MD   325 mg at 10/18/15 1026  . bisacodyl (DULCOLAX) suppository 10 mg  10 mg Rectal Daily PRN Kelvin Cellar, MD      . clopidogrel (PLAVIX) tablet 75 mg  75 mg Oral Daily Kelvin Cellar, MD   75 mg at 10/18/15 1026  . docusate sodium (COLACE) capsule 100 mg  100 mg Oral BID Kelvin Cellar, MD   100 mg at 10/18/15 2200  . heparin injection 5,000 Units  5,000 Units Subcutaneous Q8H Lavina Hamman, MD   5,000 Units at 10/19/15 0600  . hydrALAZINE (APRESOLINE) injection 5 mg  5 mg Intravenous Q6H PRN Lavina Hamman, MD   5 mg at  10/16/15 0508  . insulin aspart (novoLOG) injection 0-9 Units  0-9 Units Subcutaneous Q4H Lavina Hamman, MD   1 Units at 10/19/15 [phone]  . metoprolol tartrate (LOPRESSOR) tablet 25 mg  25 mg Oral BID Lavina Hamman, MD   25 mg at 10/18/15 2200  . polyethylene glycol (MIRALAX / GLYCOLAX) packet 17 g  17 g Oral Daily Kelvin Cellar, MD   17 g at 10/18/15 1026  . pravastatin (PRAVACHOL) tablet 20 mg  20 mg Oral q1800 Rosalin Hawking, MD   20 mg at 10/18/15 1801  . senna-docusate (Senokot-S) tablet 1 tablet  1 tablet Oral QHS PRN Lavina Hamman, MD         Discharge Medications: Please see discharge summary for a list of discharge medications.  Relevant Imaging Results:  Relevant Lab Results:   Additional Information SS#: [id number]  Jorge Ny, LCSW

## 2015-10-19 NOTE — Progress Notes (Signed)
Occupational Therapy Treatment Patient Details Name: [name] MRN: [id number] DOB: 12-10-1927 Today's Date: 10/19/2015    History of present illness 82 y.o. male admitted for slurred speech and RLE weakness. MRI (+) for acute L pons infarct. PMH significant for HTN, HLD, CAD, SVT, PVD, hx of DVT and resulting PE (2008), gout, and CKD.    OT comments  Pt very concerned over changes with retracted penis and decrease ability to void. Pt reports need to have doctor address this concern. RN Marc Morgans made aware at the end of OT session. Pt demonstrates decr balance, sequence for safety with basic transfer and inattention to the R.    Follow Up Recommendations  CIR    Equipment Recommendations  Other (comment)    Recommendations for Other Services Rehab consult    Precautions / Restrictions Precautions Precautions: Fall       Mobility Bed Mobility               General bed mobility comments: in chair on arrival  Transfers Overall transfer level: Needs assistance Equipment used: Rolling walker (2 wheeled) Transfers: Sit to/from Stand Sit to Stand: Min assist         General transfer comment: needed (A) to sequence task    Balance Overall balance assessment: Needs assistance Sitting-balance support: Bilateral upper extremity supported;Feet supported Sitting balance-Leahy Scale: Fair     Standing balance support: Single extremity supported;During functional activity Standing balance-Leahy Scale: Fair Standing balance comment: pt needed to lean with abdomen against sink with hand hygiene and single UE support when reaching.                    ADL Overall ADL's : Needs assistance/impaired     Grooming: Wash/dry hands;Minimal assistance;Standing Grooming Details (indicate cue type and reason): incr time required                 Toilet Transfer: Minimal assistance;Ambulation;RW;BSC Toilet Transfer Details (indicate cue type and reason): cues for  positioning and to make sure patient does not pull down pants prior to proper alignment with toilet.    Toileting - Clothing Manipulation Details (indicate cue type and reason): pt reports retraction of penis and inability to properly void bladder. RN Marc Morgans made aware. pt with minimal void at this time     Functional mobility during ADLs: Minimal assistance;Rolling walker General ADL Comments: pt requires cues to initiate sit<>stand and to correctly sequence RW.       Vision                 Additional Comments: Pt with R inattention noted during session. pt needed cues to locate hand soap on the R. Pt states " i am having trouble driving this thing so something has changed about my eyes"   Perception     Praxis      Cognition   Behavior During Therapy: Restless Overall Cognitive Status: Within Functional Limits for tasks assessed                       Extremity/Trunk Assessment               Exercises     Shoulder Instructions       General Comments      Pertinent Vitals/ Pain       Pain Assessment: No/denies pain  Home Living  Prior Functioning/Environment              Frequency Min 3X/week     Progress Toward Goals  OT Goals(current goals can now be found in the care plan section)  Progress towards OT goals: Progressing toward goals  Acute Rehab OT Goals Patient Stated Goal: "just get me home" OT Goal Formulation: With patient Time For Goal Achievement: 10/29/15 Potential to Achieve Goals: Good ADL Goals Pt Will Perform Grooming: with modified independence;standing Pt Will Perform Upper Body Bathing: with modified independence;sitting Pt Will Perform Lower Body Bathing: with modified independence;sit to/from stand Pt Will Transfer to Toilet: with modified independence;regular height toilet;ambulating Pt Will Perform Toileting - Clothing Manipulation and hygiene: with  modified independence;sit to/from stand  Plan Discharge plan remains appropriate    Co-evaluation                 End of Session Equipment Utilized During Treatment: Gait belt;Rolling walker   Activity Tolerance Patient tolerated treatment well   Patient Left in chair;with call bell/phone within reach;with family/visitor present;Other (comment) (x2 visitors)   Nurse Communication Mobility status;Precautions        Time: [phone] OT Time Calculation (min): 14 min  Charges: OT General Charges $OT Visit: 1 Procedure OT Treatments $Self Care/Home Management : 8-22 mins  Parke Poisson B 10/19/2015, 3:40 PM  Jeri Modena   OTR/L Pager: [phone] Office: [phone] .

## 2015-10-19 NOTE — Progress Notes (Signed)
PROGRESS NOTE    Masaaki Sturgell  [id number] DOB: 1927/10/28 DOA: 10/14/2015 PCP: Mathews Argyle, MD   Brief Narrative:  Mr Mayall is a pleasant 82 year old gentleman with a past medical history of coronary artery disease, hypertension, chronic disease, diabetes mellitus admitted to medicine service on 10/14/2015 when he presented with complaints of slurred speech, workup included MRI of brain that revealed an acute infarct in the left pons.    Assessment & Plan:   Principal Problem:   CVA (cerebral vascular accident) [facility]) Active Problems:   Peripheral vascular disease, unspecified ([place])   CVA (cerebral infarction)   Accelerated hypertension   Type 2 diabetes mellitus ([place])   HLD (hyperlipidemia)   Essential hypertension   Orthostatic hypotension   Dysphagia  1.  Acute CVA -Mr Schaal presenting with a 3 day history of dysarthria, workup included MRI of brain showing an acute CVA involving left pons. He was evaluated by neurology overnight. -Carotid Dopplers showed 1-39% stenosis involving right and left internal carotid arteries, transthoracic echocardiogram showing EF of 60-65% with grade 1 diastolic dysfunction -Continue antiplatelet therapy with aspirin -10/16/2015 patient reporting difficulty swallowing, having concerns for worsening focal deficits Code CVA was called as he underwent stat CT scan of head. Radiology did not report new intracranial abnormality. CTA showing the left vertebral artery severely atherosclerotic with moderate to severe irregularity and stenosis of the proximal basilar artery -Spoke with Neurology will treat with dual agent antiplatelet therapy with ASA and Plavix. -On 10/17/2015 he reported difficulties swallowing, presentation similar to 10/16/2015. SLP and Neurology re-consulted. SLP recommended barium swallow.  -Repeat MRI on 10/17/2015 showing extension of acute left pontine CVA, spoke with neurology recommending continuing dual agent antiplatelet  therapy.  -Plan for CIR when neurologically stable  -10/19/2015 Pending barium swallow, otherwise remains neurologically stable  2.  Hypertension. -Patient presented with stroke, allowing for permissive hypertension, breaking blood pressures down slowly.  -Will increase Cozaar to 100 mg by mouth dail and continue metoprolol at 25 mg by mouth twice a day, as we work to gradually bring down blood pressures  3.  Chronic kidney disease  -Lab work showing creatinine of 1.5   DVT prophylaxis: subcutaneous heparin  Code Status: Full Code  Family Communication: Spoke with his wife was present at bedside Disposition Plan: Plan for inpatient rehabilitation, insurance authorization pending  Consultants:   Neurology   Procedures:   Transthoracic echocardiogram performed on 10/15/2015 Impression: - Left ventricle: The cavity size was normal. Systolic function was   normal. The estimated ejection fraction was in the range of 60%   to 65%. Wall motion was normal; there were no regional wall   motion abnormalities. Doppler parameters are consistent with   abnormal left ventricular relaxation (grade 1 diastolic   dysfunction). Doppler parameters are consistent with   indeterminate ventricular filling pressure.  Subjective: Mr Beger reporting doing about the same  Objective: Vitals:   10/19/15 0027 10/19/15 0429 10/19/15 1100 10/19/15 1122  BP: (!) 195/90 (!) 198/70 (!) 172/78 (!) 172/78  Pulse: 60 65 89 89  Resp: 18 18 20    Temp: 97.7 F (36.5 C) 97.8 F (36.6 C) 97.5 F (36.4 C)   TempSrc:   Oral   SpO2: 94% 95%    Weight:      Height:       No intake or output data in the 24 hours ending 10/19/15 1408 Filed Weights   10/14/15 1233  Weight: 80.4 kg (177 lb 3.2 oz)    Examination:  General exam: NAD, sitting at bedside Respiratory system: Clear to auscultation. Respiratory effort normal. Cardiovascular system: S1 & S2 heard, RRR. No JVD, murmurs, rubs, gallops or clicks. No  pedal edema. Gastrointestinal system: Abdomen is nondistended, soft and nontender. No organomegaly or masses felt. Normal bowel sounds heard. Central nervous system: There is mild right sided facial droop, along with 4/5 muscle strength to right upper and right lower extremities.  Extremities: Symmetric 5 x 5 power. Skin: No rashes, lesions or ulcers Psychiatry: has mild dysarthria  Data Reviewed: I have personally reviewed following labs and imaging studies  CBC:  Recent Labs Lab 10/14/15 1255 10/14/15 1307 10/17/15 0551  WBC 8.6  --  8.4  NEUTROABS 5.8  --   --   HGB 17.1* 17.7* 16.6  HCT 50.3 52.0 49.2  MCV 89.0  --  89.6  PLT 187  --  0000000   Basic Metabolic Panel:  Recent Labs Lab 10/14/15 1255 10/14/15 1307 10/17/15 0551  NA 138 140 135  K 4.5 4.6 3.9  CL 104 102 105  CO2 27  --  22  GLUCOSE 127* 124* 133*  BUN 25* 27* 21*  CREATININE 1.45* 1.50* 1.49*  CALCIUM 9.8  --  9.3   GFR: Estimated Creatinine Clearance: 36.1 mL/min (by C-G formula based on SCr of 1.49 mg/dL). Liver Function Tests:  Recent Labs Lab 10/14/15 1255  AST 71*  ALT 103*  ALKPHOS 53  BILITOT 0.7  PROT 8.4*  ALBUMIN 4.8   No results for input(s): LIPASE, AMYLASE in the last 168 hours. No results for input(s): AMMONIA in the last 168 hours. Coagulation Profile:  Recent Labs Lab 10/14/15 1255  INR 1.01   Cardiac Enzymes: No results for input(s): CKTOTAL, CKMB, CKMBINDEX, TROPONINI in the last 168 hours. BNP (last 3 results) No results for input(s): PROBNP in the last 8760 hours. HbA1C:  Recent Labs  10/17/15 0551  HGBA1C 7.3*   CBG:  Recent Labs Lab 10/18/15 2016 10/19/15 0025 10/19/15 0425 10/19/15 0809 10/19/15 1157  GLUCAP 161* 116* 111* 138* 185*   Lipid Profile:  Recent Labs  10/17/15 0551  CHOL 207*  HDL 34*  LDLCALC 141*  TRIG 159*  CHOLHDL 6.1   Thyroid Function Tests: No results for input(s): TSH, T4TOTAL, FREET4, T3FREE, THYROIDAB in the last  72 hours. Anemia Panel: No results for input(s): VITAMINB12, FOLATE, FERRITIN, TIBC, IRON, RETICCTPCT in the last 72 hours. Sepsis Labs: No results for input(s): PROCALCITON, LATICACIDVEN in the last 168 hours.  No results found for this or any previous visit (from the past 240 hour(s)).       Radiology Studies: Mr Brain Ltd W/o Cm  Result Date: 10/17/2015 CLINICAL DATA:  Worsening aphasia and numbness with tingling of the LEFT side of the face. EXAM: MRI HEAD WITHOUT CONTRAST TECHNIQUE: Diffusion and ADC sequences of the brain and surrounding structures were obtained without intravenous contrast. COMPARISON:  MRI brain 10/14/2015.  CTA head neck 10/16/2015. FINDINGS: Only diffusion-weighted imaging was requested. There is moderate worsening of the LEFT paramedian pontine area of restricted diffusion representing acute infarction, as compared with 10/14/2015. There is slight increase in dorsal extension towards the floor of the fourth ventricle, there is more lateral extension towards the brachium pontis, and there is more craniocaudal extension towards the midbrain and inferior pons. No contralateral extension. No areas of cerebellar or cerebral hemispheric acute infarction are observed. IMPRESSION: Moderate worsening of acute LEFT paramedian pontine infarction as compared with 10/14/2015. Electronically Signed  By: Staci Righter M.D.   On: 10/17/2015 15:47        Scheduled Meds: . amoxicillin  500 mg Oral Q8H  . aspirin  300 mg Rectal Daily   Or  . aspirin  325 mg Oral Daily  . clopidogrel  75 mg Oral Daily  . docusate sodium  100 mg Oral BID  . heparin  5,000 Units Subcutaneous Q8H  . insulin aspart  0-9 Units Subcutaneous Q4H  . metoprolol tartrate  25 mg Oral BID  . polyethylene glycol  17 g Oral Daily  . pravastatin  20 mg Oral q1800   Continuous Infusions:    LOS: 4 days    Time spent: 15 min    Kelvin Cellar, MD [facility] Pager [phone]  If  7PM-7AM, please contact night-coverage [email or web address] Password [email or web address] 10/19/2015, 2:08 PM

## 2015-10-19 NOTE — PMR Pre-admission (Signed)
PMR Admission Coordinator Pre-Admission Assessment  Patient: [name] is an 82 y.o., male MRN: [id number] DOB: 1927-11-09 Height: _0  (170.2 cm) Weight: 80.4 kg (177 lb 3.2 oz)              Insurance Information HMO: Yes    PPO:       PCP:       IPA:       80/20:       OTHER:  Group # [id number] PRIMARY: U[employer] Medicare      Policy#: [id number]      Subscriber:  Watt Climes CM Name: Sharlett Iles      Phone#: [phone]     Fax#: [phone] Pre-Cert#: [id number] - See below     Employer: Retired Benefits:  Phone #: [phone]     Name: Purcell Nails. Date:  03/01/15     Deduct:  $0     Out of Pocket Max: $4900 (met $0)      Life Max: unlimited CIR: $345 days 1-5      SNF: $0 days 1-20; $160 days 21-51; $0 days 52-100 Outpatient: medical necessity     Co-Pay: $40 copay Home Health: 100%      Co-Pay: none DME: 80%     Co-Pay: 20% Providers: in network benefits only - Note we are in a denial mode at this time with an expedited appeal in process.  We should know something from the appeal in the next 3 days.  Medicaid Application Date:        Case Manager:   Disability Application Date:        Case Worker:    Emergency Contact Information Contact Information    Name Relation Home Work Mobile   Allayne Gitelman Daughter [phone]  [phone]   Laney Pastor Daughter [phone]       Current Medical History  Patient Admitting Diagnosis:  left pontine infarct with right hemiparesis dysarthria/dysphagia/processing delays   History of Present Illness: An 82 y.o.malewith history of Gout,CADmaintained on aspirin, SVT, CKDwith creatinine 1.50, T2DM PVD with RLE claudicationwho was admitted on 10/14/15 with difficulty talking, difficulty moving RLE and difficulty with RUE coordination.Per chart review patient lives alone independently using a walker prior to admission.Patient had stopped taking his ASA 8/13 in anticipation of upcoming dental procedure. UDS negative MRI brain/CT angiogram  of head and neckdone revealing acute left pontine infarct, generalized atrophy, moderate narrowing midportion of basilar artery and multifocal moderate to severe narrowing of intracranial internal carotid arteries L >R.Carotid dopplers without significant stenosis. Cardiac echo with EF 57-26%, normal systolic function and mild MVR/AVR. [name] recommended increasing ASA 325 mg with plavix X 3 months followed by plavix alone due to BA stenosis. BP goal 130 -150 due to BA stenosis.Subcutaneous heparin for DVT prophylaxis.On the afternoon of 10/20/2015 after arrangements being made to be admitted to CIR patient while sitting up in chair became unresponsive with heart rate in the 40s. Patient had received metoprolol at morning. EKG showed prolongation of the P-P interval with resultant significant pauseand heart rate 56. Rapid response contacted. He was transferred to ICU and cardiology consulted. It was felt asystole likely vagally mediated and no acute indication for pacemaker and cardiology signed off as patient had no additional episodes. Right hand and wrist swollen tender suspect flareup of gout as well as right knee with colchicine and prednisone initiated 10/21/2015. Tolerating a mechanical soft diet. Physical and occupational therapy evaluations completed with recommendations of physical medicine rehabilitation consult. Patient to be admitted  for comprehensive inpatient rehabilitation program.    Total: 3=NIH  Past Medical History  Past Medical History:  Diagnosis Date  . Adenomatous polyp   . CAD (coronary artery disease)   . Chronic kidney disease   . Diverticulosis   . DVT (deep venous thrombosis) ([place])   . Gout   . History of pulmonary embolus (PE)    from a DVT in 2008  . Hyperglycemia   . Hyperlipidemia   . Hypertension   . Mallory-Weiss tear   . Peripheral vascular disease ([place])   . SVT (supraventricular tachycardia) ([place])   . Vitamin B12 deficiency     Family History   family history includes CAD in his father; Cancer in his mother; Heart attack in his father.  Prior Rehab/Hospitalizations: No previous rehab.  Has the patient had major surgery during 100 days prior to admission? No  Current Medications   Current Facility-Administered Medications:  .  0.9 %  sodium chloride infusion, , Intravenous, Continuous, Kelvin Cellar, MD, Last Rate: 75 mL/hr at 10/21/15 0743 .  acetaminophen (TYLENOL) tablet 650 mg, 650 mg, Oral, Q6H PRN, Kelvin Cellar, MD, 650 mg at 10/15/15 1851 .  amoxicillin (AMOXIL) 250 MG/5ML suspension 500 mg, 500 mg, Oral, Q8H, Kelvin Cellar, MD, Stopped at 10/20/15 2200 .  antiseptic oral rinse (CPC / CETYLPYRIDINIUM CHLORIDE 0.05%) solution 7 mL, 7 mL, Mouth Rinse, BID, Patrecia Pour, MD .  aspirin suppository 300 mg, 300 mg, Rectal, Daily, 300 mg at 10/14/15 2305 **OR** aspirin tablet 325 mg, 325 mg, Oral, Daily, Lavina Hamman, MD, 325 mg at 10/21/15 1158 .  bisacodyl (DULCOLAX) suppository 10 mg, 10 mg, Rectal, Daily PRN, Kelvin Cellar, MD .  clopidogrel (PLAVIX) tablet 75 mg, 75 mg, Oral, Daily, Kelvin Cellar, MD, 75 mg at 10/21/15 1158 .  [COMPLETED] colchicine tablet 1.2 mg, 1.2 mg, Oral, Once, 1.2 mg at 10/21/15 1158 **FOLLOWED BY** colchicine tablet 0.6 mg, 0.6 mg, Oral, Once, Patrecia Pour, MD .  docusate sodium (COLACE) capsule 100 mg, 100 mg, Oral, BID, Kelvin Cellar, MD, 100 mg at 10/21/15 1159 .  heparin injection 5,000 Units, 5,000 Units, Subcutaneous, Q8H, Lavina Hamman, MD, 5,000 Units at 10/21/15 4431 .  hydrALAZINE (APRESOLINE) injection 5 mg, 5 mg, Intravenous, Q6H PRN, Lavina Hamman, MD, 5 mg at 10/16/15 0508 .  hydrALAZINE (APRESOLINE) tablet 10 mg, 10 mg, Oral, Q6H, Patrecia Pour, MD, 10 mg at 10/21/15 1158 .  insulin aspart (novoLOG) injection 0-9 Units, 0-9 Units, Subcutaneous, Q4H, Lavina Hamman, MD, 1 Units at 10/20/15 2024 .  polyethylene glycol (MIRALAX / GLYCOLAX) packet 17 g, 17 g, Oral, Daily,  Kelvin Cellar, MD, 17 g at 10/21/15 1200 .  pravastatin (PRAVACHOL) tablet 20 mg, 20 mg, Oral, q1800, Rosalin Hawking, MD, 20 mg at 10/19/15 1709 .  predniSONE (DELTASONE) tablet 40 mg, 40 mg, Oral, Q breakfast, Patrecia Pour, MD, 40 mg at 10/21/15 1159 .  senna-docusate (Senokot-S) tablet 1 tablet, 1 tablet, Oral, QHS PRN, Lavina Hamman, MD  Patients Current Diet: DIET DYS 3 Room service appropriate? Yes; Fluid consistency: Thin  Precautions / Restrictions Precautions Precautions: Fall Restrictions Weight Bearing Restrictions: No   Has the patient had 2 or more falls or a fall with injury in the past year?No  Prior Activity Level Community (5-7x/wk): Went out daily, was driving.  Home Assistive Devices / Equipment Home Assistive Devices/Equipment: None Home Equipment: Bedside commode, Environmental consultant - 2 wheels, [place] - single point  Prior Device  Use: Indicate devices/aids used by the patient prior to current illness, exacerbation or injury? None.  Daughter reports that no device was used at home.  Prior Functional Level Prior Function Level of Independence: Independent  Self Care: Did the patient need help bathing, dressing, using the toilet or eating?  Independent  Indoor Mobility: Did the patient need assistance with walking from room to room (with or without device)? Independent  Stairs: Did the patient need assistance with internal or external stairs (with or without device)? Independent  Functional Cognition: Did the patient need help planning regular tasks such as shopping or remembering to take medications? Independent  Current Functional Level Cognition  Arousal/Alertness: Awake/alert Overall Cognitive Status: Within Functional Limits for tasks assessed Difficult to assess due to:  (Pt emotional and crying throughout session) Orientation Level: Oriented to person, Oriented to place, Disoriented to situation, Disoriented to time Attention: Sustained Sustained Attention:  Appears intact Memory: Appears intact Awareness: Appears intact Problem Solving: Appears intact    Extremity Assessment (includes Sensation/Coordination)  Upper Extremity Assessment: Defer to OT evaluation  Lower Extremity Assessment: RLE deficits/detail, LLE deficits/detail RLE Deficits / Details: proximal weaknesses 4/5, generally coordinated. LLE Deficits / Details: WFL, mild proximal weakness    ADLs  Overall ADL's : Needs assistance/impaired Grooming: Wash/dry hands, Minimal assistance, Standing Grooming Details (indicate cue type and reason): incr time required Toilet Transfer: Minimal assistance, Ambulation, RW, BSC Toilet Transfer Details (indicate cue type and reason): cues for positioning and to make sure patient does not pull down pants prior to proper alignment with toilet.  Toileting - Clothing Manipulation Details (indicate cue type and reason): pt reports retraction of penis and inability to properly void bladder. RN Marc Morgans made aware. pt with minimal void at this time Functional mobility during ADLs: Minimal assistance, Rolling walker General ADL Comments: pt requires cues to initiate sit<>stand and to correctly sequence RW.     Mobility  Overal bed mobility: Needs Assistance Bed Mobility: Supine to Sit, Sit to Supine Supine to sit: Min assist Sit to supine: Min assist General bed mobility comments: min asssit to come to sitting for LEs and elevation of trunk    Transfers  Overall transfer level: Needs assistance Equipment used: 1 person hand held assist Transfers: Sit to/from Stand Sit to Stand: Min assist, +2 safety/equipment General transfer comment: min assist to boost to stand to stabilize balance upon standing.  Pt needed handheld assist on left hand.  Right hand with gout and very sore.  Pt was able to stand for 2 min at EOB shifting weight and picking up one LE at a time.  Pt demonstrated incr weight on right hemibody.  Needed cues for midline as head even  flexed to right.     Ambulation / Gait / Stairs / Wheelchair Mobility  Ambulation/Gait Ambulation/Gait assistance: Min assist, Min guard Ambulation Distance (Feet): 130 Feet Assistive device: Rolling walker (2 wheeled) Gait Pattern/deviations: Step-through pattern General Gait Details: mildly unsteady worsening with fatigue, but better with RW.  Worked on heel/toe gait Gait velocity interpretation: Below normal speed for age/gender    Posture / Balance Balance Overall balance assessment: Needs assistance Sitting-balance support: Feet supported, Single extremity supported Sitting balance-Leahy Scale: Fair Standing balance support: Single extremity supported, During functional activity Standing balance-Leahy Scale: Fair Standing balance comment: Pt was able to stand with single UE support on one side and guard on other side with min to mod assist for static stance.  Pt limited by right UE and LE pain  due to gout.     Special needs/care consideration BiPAP/CPAP No CPM No Continuous Drip IV No Dialysis No         Life Vest No Oxygen No Special Bed No Trach Size No Wound Vac (area) No     Skin No                            Bowel mgmt: Last BM 10/19/15.  Reports problems of constipation. Bladder mgmt: Voiding up in bathroom with assistance Diabetic mgmt Yes, on oral medications at home.    Previous Home Environment Living Arrangements: Alone  Lives With: Alone Available Help at Discharge: Family, Available 24 hours/day Type of Home: House Home Layout: One level Home Access: Stairs to enter Entrance Stairs-Rails: None Entrance Stairs-Number of Steps: 2+1 Bathroom Shower/Tub: Public librarian, Multimedia programmer: Standard Home Care Services: No Additional Comments: Unable to obtain as pt was very emotional and crying when asked about home  Discharge Living Setting Plans for Discharge Living Setting: House, Lives with (comment) (Plans home with daughter and  son-in-law.)  Also has a granddaughter and a great granddaughter in daughters home. Type of Home at Discharge: House Discharge Home Layout: Two level, Laundry or work area in basement, Able to live on main level with bedroom/bathroom Alternate Level Stairs-Number of Steps: Flight Discharge Home Access: Stairs to enter Technical brewer of Steps: 3 Does the patient have any problems obtaining your medications?: No  Social/Family/Support Systems Patient Roles: Parent (Has 2 daughters, grand daughter, great grand daughter.) Contact Information: [name] Wimbs - daughter Anticipated Caregiver: daughter Anticipated Caregiver's Contact Information: [name] - daughter - [phone] Ability/Limitations of Caregiver: Daughter can work from home and provide supervision Caregiver Availability: 24/7 Discharge Plan Discussed with Primary Caregiver: Yes Is Caregiver In Agreement with Plan?: Yes Does Caregiver/Family have Issues with Lodging/Transportation while Pt is in Rehab?: No  Goals/Additional Needs Patient/Family Goal for Rehab: PT/OT mod I, SLP mod I and supervision goals Expected length of stay: 7-10 days Cultural Considerations: [employer] Dietary Needs: Dys 3, thin liquids Equipment Needs: TBD Pt/Family Agrees to Admission and willing to participate: Yes Program Orientation Provided & Reviewed with Pt/Caregiver Including Roles  & Responsibilities: Yes  Decrease burden of Care through IP rehab admission: N/A  Possible need for SNF placement upon discharge: Not anticipated  Patient Condition: This patient's medical and functional status has changed since the consult dated: 10/16/15 in which the Rehabilitation Physician determined and documented that the patient's condition is appropriate for intensive rehabilitative care in an inpatient rehabilitation facility. See "History of Present Illness" (above) for medical update. Functional changes are: Currently requiring min assist for  transfers and min to min guard assist 130 feet RW. Patient's medical and functional status update has been discussed with the Rehabilitation physician and patient remains appropriate for inpatient rehabilitation. Will admit to inpatient rehab today.   Preadmission Screen Completed By:  Retta Diones, 10/21/2015 1:49 PM ______________________________________________________________________   Discussed status with Dr. Posey Pronto on 10/21/15 at 1348 and received telephone approval for admission today.  Admission Coordinator:  Retta Diones, time1348/Date08/23/17

## 2015-10-19 NOTE — Progress Notes (Signed)
Physical Therapy Treatment Patient Details Name: [name] MRN: [id number] DOB: 07-07-1927 Today's Date: 10/19/2015    History of Present Illness 82 y.o. male admitted for slurred speech and RLE weakness. MRI (+) for acute L pons infarct. PMH significant for HTN, HLD, CAD, SVT, PVD, hx of DVT and resulting PE (2008), gout, and CKD.     PT Comments    Progressing slowly.  Able to work on heel/toe gait with use of the RW.  Noticeable fatigue as expected.   Follow Up Recommendations  CIR     Equipment Recommendations  None recommended by PT    Recommendations for Other Services Rehab consult     Precautions / Restrictions Precautions Precautions: Fall    Mobility  Bed Mobility Overal bed mobility: Needs Assistance             General bed mobility comments: in chair on arrival  Transfers Overall transfer level: Needs assistance Equipment used: Rolling walker (2 wheeled) Transfers: Sit to/from Stand Sit to Stand: Min assist         General transfer comment: needed (A) to sequence task  Ambulation/Gait Ambulation/Gait assistance: Min assist;Min guard Ambulation Distance (Feet): 130 Feet Assistive device: Rolling walker (2 wheeled) Gait Pattern/deviations: Step-through pattern     General Gait Details: mildly unsteady worsening with fatigue, but better with RW.  Worked on heel/toe Microbiologist Rankin (Stroke Patients Only) Modified Rankin (Stroke Patients Only) Pre-Morbid Rankin Score: No symptoms Modified Rankin: Moderately severe disability     Balance Overall balance assessment: Needs assistance Sitting-balance support: Bilateral upper extremity supported;Feet supported Sitting balance-Leahy Scale: Fair     Standing balance support: Single extremity supported;During functional activity Standing balance-Leahy Scale: Fair Standing balance comment: pt needed to lean with abdomen against sink with  hand hygiene and single UE support when reaching.  Able to reach out and pinch the paper towel with 3 tries.                    Cognition Arousal/Alertness: Awake/alert Behavior During Therapy: Restless Overall Cognitive Status: Within Functional Limits for tasks assessed                      Exercises      General Comments        Pertinent Vitals/Pain Pain Assessment: No/denies pain Pain Intervention(s): Monitored during session;Repositioned    Home Living                      Prior Function            PT Goals (current goals can now be found in the care plan section) Acute Rehab PT Goals Patient Stated Goal: "just get me home" PT Goal Formulation: With patient Time For Goal Achievement: 10/30/15 Potential to Achieve Goals: Good Progress towards PT goals: Progressing toward goals    Frequency  Min 3X/week    PT Plan Current plan remains appropriate    Co-evaluation             End of Session   Activity Tolerance: Patient tolerated treatment well;Patient limited by fatigue Patient left: in bed;with call bell/phone within reach;with family/visitor present     Time: [id number] PT Time Calculation (min) (ACUTE ONLY): 24 min  Charges:  $Gait Training: 8-22 mins $Therapeutic Activity: 8-22 mins  G Codes:      [name], [name] 10/19/2015, 3:53 PM  10/19/2015  Donnella Sham, PT [phone]  (pager)

## 2015-10-20 ENCOUNTER — Inpatient Hospital Stay (HOSPITAL_COMMUNITY): Payer: Medicare Other

## 2015-10-20 DIAGNOSIS — I498 Other specified cardiac arrhythmias: Secondary | ICD-10-CM

## 2015-10-20 DIAGNOSIS — R55 Syncope and collapse: Secondary | ICD-10-CM

## 2015-10-20 LAB — GLUCOSE, CAPILLARY
GLUCOSE-CAPILLARY: 134 mg/dL — AB (ref 65–99)
Glucose-Capillary: 129 mg/dL — ABNORMAL HIGH (ref 65–99)
Glucose-Capillary: 130 mg/dL — ABNORMAL HIGH (ref 65–99)
Glucose-Capillary: 145 mg/dL — ABNORMAL HIGH (ref 65–99)
Glucose-Capillary: 169 mg/dL — ABNORMAL HIGH (ref 65–99)
Glucose-Capillary: 98 mg/dL (ref 65–99)

## 2015-10-20 LAB — MRSA PCR SCREENING: MRSA by PCR: NEGATIVE

## 2015-10-20 MED ORDER — SODIUM CHLORIDE 0.9 % IV SOLN
INTRAVENOUS | Status: DC
  Administered 2015-10-20 – 2015-10-21 (×2): via INTRAVENOUS

## 2015-10-20 MED ORDER — ONDANSETRON HCL 4 MG/2ML IJ SOLN
4.0000 mg | Freq: Once | INTRAMUSCULAR | Status: AC
  Administered 2015-10-20: 4 mg via INTRAVENOUS

## 2015-10-20 MED ORDER — ONDANSETRON HCL 4 MG/2ML IJ SOLN
INTRAMUSCULAR | Status: AC
  Administered 2015-10-20: 4 mg
  Filled 2015-10-20: qty 2

## 2015-10-20 NOTE — Progress Notes (Signed)
NIHSS completed. Scored 7.  Pt lethargic but follows commands.  Katie, RN from [facility] came to [facility] to look at patient stated that this was his baseline.  Family at bedside.

## 2015-10-20 NOTE — Progress Notes (Signed)
PROGRESS NOTE    [name]  [id number] DOB: 1927/08/14 DOA: 10/14/2015 PCP: Mathews Argyle, MD   Brief Narrative:  Mr [name] is a pleasant 82 year old gentleman with a past medical history of coronary artery disease, hypertension, chronic disease, diabetes mellitus admitted to medicine service on 10/14/2015 when he presented with complaints of slurred speech, workup included MRI of brain that revealed an acute infarct in the left pons. On 10/16/2015 he reported having dark red or difficulties swallowing along with worsening right-sided weakness for which could CVA was called. He underwent a CT scan of the head that was stable. CTA showing left vertebral artery atherosclerotic with moderate to severe irregularity and stenosis of the proximal basilar artery. Neurology recommending treatment with dual agent antiplatelet therapy with aspirin and Plavix for 3 once the Plavix monotherapy thereafter. Plans have been made for discharge to CIR. On 10/20/2015 rapid response called, daughter reported that he became unresponsive. Telemetry reviewed have been bradycardic with heart rates in the 40s and then has an 11 second pause. Prior to that she denied seizure-like activity or worsening of his focal neurological deficits. He received morning dose of metoprolol. He regained consciousness, vitals were stable, EKG showed sinus rhythm with HR of  transferred to SDU. Cardiology was consulted.    Assessment & Plan:   Principal Problem:   CVA (cerebral vascular accident) ([place]) Active Problems:   Peripheral vascular disease, unspecified ([place])   CVA (cerebral infarction)   Accelerated hypertension   Type 2 diabetes mellitus ([place])   HLD (hyperlipidemia)   Essential hypertension   Orthostatic hypotension   Dysphagia   Aphasia  1.  Arrhythmia/Bradycardia -On 10/20/2015 Mr Truby becoming unresponsive, rapid response called.  -Telemetry strips reviewed, he had sinus brady with HR's in the 40's the an 11  sec pause. EKG showed a sinus rhythm  -Earlier in the day had received metoprolol, which has now been discontinued -He was transferred to SDU with cardiology being consulted.   2.  Acute CVA -Mr [name] presenting with a 3 day history of dysarthria, workup included MRI of brain showing an acute CVA involving left pons. He was evaluated by neurology overnight. -Carotid Dopplers showed 1-39% stenosis involving right and left internal carotid arteries, transthoracic echocardiogram showing EF of 60-65% with grade 1 diastolic dysfunction -Continue antiplatelet therapy with aspirin -10/16/2015 patient reporting difficulty swallowing, having concerns for worsening focal deficits Code CVA was called as he underwent stat CT scan of head. Radiology did not report new intracranial abnormality. CTA showing the left vertebral artery severely atherosclerotic with moderate to severe irregularity and stenosis of the proximal basilar artery -Spoke with Neurology will treat with dual agent antiplatelet therapy with ASA and Plavix. -On 10/17/2015 he reported difficulties swallowing, presentation similar to 10/16/2015. SLP and Neurology re-consulted. SLP recommended barium swallow.  -Repeat MRI on 10/17/2015 showing extension of acute left pontine CVA, spoke with neurology recommending continuing dual agent antiplatelet therapy.  -Plan for CIR when neurologically stable  -Pending barium swallow, otherwise remains neurologically stable -Transfer to CIR held due to today's events  2.  Hypertension. -Patient presented with stroke, allowing for permissive hypertension, breaking blood pressures down slowly.  -Stopped Metoprolol  3.  Chronic kidney disease  -Lab work showing creatinine of 1.5   DVT prophylaxis: subcutaneous heparin  Code Status: Full Code  Family Communication: Spoke with his daughter who was present at bedsdie Disposition Plan: Transferred to SDU  Consultants:   Neurology   Procedures:    Transthoracic echocardiogram performed on  10/15/2015 Impression: - Left ventricle: The cavity size was normal. Systolic function was   normal. The estimated ejection fraction was in the range of 60%   to 65%. Wall motion was normal; there were no regional wall   motion abnormalities. Doppler parameters are consistent with   abnormal left ventricular relaxation (grade 1 diastolic   dysfunction). Doppler parameters are consistent with   indeterminate ventricular filling pressure.  Subjective: During my assessment he had reported doing about the same, later rapid response called he was found to be unresponsive, regained consciousness   Objective: Vitals:   10/19/15 1122 10/19/15 2131 10/20/15 0317 10/20/15 0528  BP: (!) 172/78 (!) 174/71 139/73 (!) 148/68  Pulse: 89 67 66 71  Resp:  18 18 18   Temp:  98.2 F (36.8 C) 98.3 F (36.8 C) 98.5 F (36.9 C)  TempSrc:  Oral Oral Oral  SpO2:  97% 95% 95%  Weight:      Height:        Intake/Output Summary (Last 24 hours) at 10/20/15 1424 Last data filed at 10/20/15 1027  Gross per 24 hour  Intake              200 ml  Output                0 ml  Net              200 ml   Filed Weights   10/14/15 1233  Weight: 80.4 kg (177 lb 3.2 oz)    Examination:  General exam: Seen after rapid response, coning too, appears confused.  Respiratory system: Clear to auscultation. Respiratory effort normal. Cardiovascular system: S1 & S2 heard, RRR. No JVD, murmurs, rubs, gallops or clicks. No pedal edema. Gastrointestinal system: Abdomen is nondistended, soft and nontender. No organomegaly or masses felt. Normal bowel sounds heard. Central nervous system: There is mild right sided facial droop, along with 4/5 muscle strength to right upper and right lower extremities.  Extremities: 4/5 muscle strength to right upper and lower extremity Skin: No rashes, lesions or ulcers Psychiatry: has mild dysarthria  Data Reviewed: I have personally reviewed  following labs and imaging studies  CBC:  Recent Labs Lab 10/14/15 1255 10/14/15 1307 10/17/15 0551  WBC 8.6  --  8.4  NEUTROABS 5.8  --   --   HGB 17.1* 17.7* 16.6  HCT 50.3 52.0 49.2  MCV 89.0  --  89.6  PLT 187  --  0000000   Basic Metabolic Panel:  Recent Labs Lab 10/14/15 1255 10/14/15 1307 10/17/15 0551  NA 138 140 135  K 4.5 4.6 3.9  CL 104 102 105  CO2 27  --  22  GLUCOSE 127* 124* 133*  BUN 25* 27* 21*  CREATININE 1.45* 1.50* 1.49*  CALCIUM 9.8  --  9.3   GFR: Estimated Creatinine Clearance: 36.1 mL/min (by C-G formula based on SCr of 1.49 mg/dL). Liver Function Tests:  Recent Labs Lab 10/14/15 1255  AST 71*  ALT 103*  ALKPHOS 53  BILITOT 0.7  PROT 8.4*  ALBUMIN 4.8   No results for input(s): LIPASE, AMYLASE in the last 168 hours. No results for input(s): AMMONIA in the last 168 hours. Coagulation Profile:  Recent Labs Lab 10/14/15 1255  INR 1.01   Cardiac Enzymes: No results for input(s): CKTOTAL, CKMB, CKMBINDEX, TROPONINI in the last 168 hours. BNP (last 3 results) No results for input(s): PROBNP in the last 8760 hours. HbA1C: No results for input(s): HGBA1C  in the last 72 hours. CBG:  Recent Labs Lab 10/19/15 2002 10/20/15 0011 10/20/15 0525 10/20/15 0745 10/20/15 1133  GLUCAP 187* 129* 130* 134* 169*   Lipid Profile: No results for input(s): CHOL, HDL, LDLCALC, TRIG, CHOLHDL, LDLDIRECT in the last 72 hours. Thyroid Function Tests: No results for input(s): TSH, T4TOTAL, FREET4, T3FREE, THYROIDAB in the last 72 hours. Anemia Panel: No results for input(s): VITAMINB12, FOLATE, FERRITIN, TIBC, IRON, RETICCTPCT in the last 72 hours. Sepsis Labs: No results for input(s): PROCALCITON, LATICACIDVEN in the last 168 hours.  Recent Results (from the past 240 hour(s))  MRSA PCR Screening     Status: None   Collection Time: 10/20/15 11:59 AM  Result Value Ref Range Status   MRSA by PCR NEGATIVE NEGATIVE Final    Comment:        The  GeneXpert MRSA Assay (FDA approved for NASAL specimens only), is one component of a comprehensive MRSA colonization surveillance program. It is not intended to diagnose MRSA infection nor to guide or monitor treatment for MRSA infections.          Radiology Studies: No results found.      Scheduled Meds: . amoxicillin  500 mg Oral Q8H  . aspirin  300 mg Rectal Daily   Or  . aspirin  325 mg Oral Daily  . clopidogrel  75 mg Oral Daily  . docusate sodium  100 mg Oral BID  . heparin  5,000 Units Subcutaneous Q8H  . insulin aspart  0-9 Units Subcutaneous Q4H  . polyethylene glycol  17 g Oral Daily  . pravastatin  20 mg Oral q1800   Continuous Infusions: . sodium chloride 75 mL/hr at 10/20/15 1230     LOS: 5 days    Time spent: 35 min    Kelvin Cellar, MD [facility] Pager [phone]  If 7PM-7AM, please contact night-coverage [email or web address] Password [facility] 10/20/2015, 2:24 PM

## 2015-10-20 NOTE — Progress Notes (Signed)
Speech Language Pathology Treatment: Dysphagia;Cognitive-Linquistic  Patient Details Name: [name] MRN: [id number] DOB: 1927-11-01 Today's Date: 10/20/2015 Time: [id number] SLP Time Calculation (min) (ACUTE ONLY): 26 min  Assessment / Plan / Recommendation Clinical Impression  Skilled treatment session focused on addressing dysphagia goals. Upon SLP arrival patient was up in the chair consuming Dys.3 textures and thin liquids.  Patient continues to report a globus sensation with PO and SLP facilitated session by providing Min verbal cues to alternate every bite of solids with a thin liquid bolus.  Patient initially demonstrated no overt s/s of aspiration; however, as trials went on patient exhibited cough x1 which thin liquid wash appeared to reduce.  Daughter present to observe session and cues today.  Continue to recommend regular barium swallow to fully assess esophageal function given the patient's complaints and history of esophageal tear. MD and RN present and reported that study should be completed today.  Plan to continue to follow for results as well as tolerance.  Skilled treatment session also focused on addressing speech-language goals.  Patient required increased time and Min verbal cues for word finding to express complex wants and needs as well as Min verbal cues to utilize increased vocal intensity, which also increased intelligibility in conversation.  Continue to recommend IP Rehab for post acute follow up therapies, patient and daughter in agreement.  Continue with current plan of care.     HPI HPI: Pt is an 82 y/o m admitted due to neuro changes including "stumbling over words". He has been diagnosed with paramedian pons stroke.BSE 8/17 revealed functional and safe swallow. Dys 3/thin recommended due to dental pain. 8/18 pt complained of difficulty speaking, swallowing and pocketing of food. Order to reassess swallow.  Question possible extension of CVA due to worsening symptoms.   Repeat MRI completed        SLP Plan  Continue with current plan of care     Recommendations  Diet recommendations: Dysphagia 3 (mechanical soft);Thin liquid Liquids provided via: Cup;Straw Medication Administration: Crushed with puree Supervision: Patient able to self feed;Intermittent supervision to cue for compensatory strategies Compensations: Slow rate;Small sips/bites;Lingual sweep for clearance of pocketing;Follow solids with liquid Postural Changes and/or Swallow Maneuvers: Seated upright 90 degrees;Upright 30-60 min after meal             Oral Care Recommendations: Oral care BID Follow up Recommendations: Inpatient Rehab Plan: Continue with current plan of care     GO              Carmelia Roller., CCC-SLP [id number]   [place] 10/20/2015, 10:46 AM

## 2015-10-20 NOTE — Progress Notes (Signed)
Chaplain responded to a Code Blue to the room of the patient.  The medical team was accessing medical status upon Chaplain arrival to the room. The patient's daughter was present, and Chaplain provided support and prayer of support. The patient was transferred to  2C07 and daughter was escorted to the Unit.  Mable Fill will continue to follow up for spiritual care support, Chaplain Yaakov Guthrie [phone]

## 2015-10-20 NOTE — Progress Notes (Addendum)
4 mg of zofran given IV. Pt. Is clean, sitting up in the bed, report given to RN. Robina Ade RN

## 2015-10-20 NOTE — Care Management Important Message (Signed)
Important Message  Patient Details  Name: [name] MRN: [id number] Date of Birth: 11-Oct-1927   Medicare Important Message Given:  Yes    Loann Quill 10/20/2015, 10:19 AM

## 2015-10-20 NOTE — Progress Notes (Signed)
Per staff, alerted by monitor tech, patient with brady HR.  Per staff upon arrival patient sitting in chair became unresponsive.  Had an 11 sec pause on monitor per staff.   Quickly assisted to bed.  Upon my arrival patient is responsive, breathing on his own with HR in the 50s.  BP  189/89 manually O2 sats 100% on NRB.  2nd IV started by IV team. Lung sounds clear, heart tones regular. Daughter at bedside during event.  12 lead EKG done.  Patient transferred to 2c07 with zoll monitor and O2.  RN at bedside to receive patient.

## 2015-10-20 NOTE — Consult Note (Signed)
ELECTROPHYSIOLOGY CONSULT NOTE    Patient ID: [name] MRN: [id number], DOB/AGE: [birth year] 82 y.o.  Admit date: 10/14/2015 Date of Consult: 10/20/2015  Primary Physician: Mathews Argyle, MD Primary Cardiologist: new to [place]  Referring MD: Coralyn Pear   Reason for Consultation: pause on telemetry   HPI:  Zuhair Turrentine is a 82 y.o. male with a past medical history significant for hypertension, CKD, diabetes, hyperlipidemia, PVD, and prior PE who presented on the day of admission with a 3 day history of slurred speech. He was found to have a pontine infarct and has been recovering with plans to go to CIR later today. He was sitting in the chair and developed prolongation of the P-P interval with resultant significant pause and syncope.  He has been transferred to [facility] and EP has been asked to evaluate. He is on Metoprolol 25mg  twice daily and received a dose this morning.   Echo this admission demonstrated EF 60-65%, no RWMA, grade 1 diastolic dysfunction, mild MR.   He currently complains of nausea.  ROS is otherwise not able to be obtained 2/2 baseline mental status.   Past Medical History:  Diagnosis Date  . Adenomatous polyp   . CAD (coronary artery disease)   . Chronic kidney disease   . Diverticulosis   . DVT (deep venous thrombosis) ([place])   . Gout   . History of pulmonary embolus (PE)    from a DVT in 2008  . Hyperglycemia   . Hyperlipidemia   . Hypertension   . Mallory-Weiss tear   . Peripheral vascular disease ([place])   . SVT (supraventricular tachycardia) ([place])   . Vitamin B12 deficiency      Surgical History:  Past Surgical History:  Procedure Laterality Date  .  PTCA of coronary lesion Right 1995  . APPENDECTOMY    . PR VEIN BYPASS GRAFT,AORTO-FEM-POP Left    femoral-popliteal BP by Dr. Drucie Opitz  . repair of aal fissure   1978     Prescriptions Prior to Admission  Medication Sig Dispense Refill Last Dose  . amoxicillin (AMOXIL) 500 MG capsule  Take 500 mg by mouth every 6 (six) hours.    10/14/2015 at Unknown time  . aspirin 81 MG tablet Take 81 mg by mouth daily.   Past Week at Unknown time  . losartan (COZAAR) 50 MG tablet Take 50 mg by mouth daily.   10/14/2015 at Unknown time  . metoprolol tartrate (LOPRESSOR) 25 MG tablet Take 25 mg by mouth 2 (two) times daily.   10/14/2015 at 0830    Inpatient Medications:  . amoxicillin  500 mg Oral Q8H  . aspirin  300 mg Rectal Daily   Or  . aspirin  325 mg Oral Daily  . clopidogrel  75 mg Oral Daily  . docusate sodium  100 mg Oral BID  . heparin  5,000 Units Subcutaneous Q8H  . insulin aspart  0-9 Units Subcutaneous Q4H  . metoprolol tartrate  25 mg Oral BID  . ondansetron  4 mg Intravenous Once  . polyethylene glycol  17 g Oral Daily  . pravastatin  20 mg Oral q1800    Allergies:  Allergies  Allergen Reactions  . Lipitor [Atorvastatin]     Muscle pain   . Zetia [Ezetimibe] Hives    Social History   Social History  . Marital status: Widowed    Spouse name: N/A  . Number of children: N/A  . Years of education: N/A   Occupational History  .  Not on file.   Social History Main Topics  . Smoking status: Never Smoker  . Smokeless tobacco: Never Used  . Alcohol use No  . Drug use: No  . Sexual activity: Not on file   Other Topics Concern  . Not on file   Social History Narrative  . No narrative on file     Family History  Problem Relation Age of Onset  . Cancer Mother     bladder  . CAD Father   . Heart attack Father      Review of Systems: All other systems reviewed and are otherwise negative except as noted above.  Physical Exam: Vitals:   10/19/15 1122 10/19/15 2131 10/20/15 0317 10/20/15 0528  BP: (!) 172/78 (!) 174/71 139/73 (!) 148/68  Pulse: 89 67 66 71  Resp:  18 18 18   Temp:  98.2 F (36.8 C) 98.3 F (36.8 C) 98.5 F (36.9 C)  TempSrc:  Oral Oral Oral  SpO2:  97% 95% 95%  Weight:      Height:        GEN- The patient is elderly and  chronically ill appearing, drowsy but awakens HEENT: normocephalic, atraumatic; sclera clear, conjunctiva pink; hearing intact; oropharynx clear; neck supple  Lungs- Clear to ausculation bilaterally, normal work of breathing.  No wheezes, rales, rhonchi Heart- Regular rate and rhythm  GI- soft, non-tender, non-distended, bowel sounds present  Extremities- no clubbing, cyanosis, or edema MS- no significant deformity or atrophy Skin- warm and dry, no rash or lesion Psych- euthymic mood, full affect Neuro- strength and sensation are intact  Labs:   Lab Results  Component Value Date   WBC 8.4 10/17/2015   HGB 16.6 10/17/2015   HCT 49.2 10/17/2015   MCV 89.6 10/17/2015   PLT 178 10/17/2015    Recent Labs Lab 10/14/15 1255  10/17/15 0551  NA 138  < > 135  K 4.5  < > 3.9  CL 104  < > 105  CO2 27  --  22  BUN 25*  < > 21*  CREATININE 1.45*  < > 1.49*  CALCIUM 9.8  --  9.3  PROT 8.4*  --   --   BILITOT 0.7  --   --   ALKPHOS 53  --   --   ALT 103*  --   --   AST 71*  --   --   GLUCOSE 127*  < > 133*  < > = values in this interval not displayed.    Radiology/Studies: Ct Angio Head W Or Wo Contrast Addendum Date: 10/16/2015   ADDENDUM REPORT: 10/16/2015 10:20 ADDENDUM: Study discussed by telephone with Dr. Roland Rack on 10/16/2015 At 1014 hours. We also discussed the bulky soft plaque or mural thrombus in the distal arch, which is not strongly suspected to be a factor in the intracranial ischemia given noted is distal to the left subclavian origin. Electronically Signed   By: Genevie Ann M.D.   On: 10/16/2015 10:20  Result Date: 10/16/2015 CLINICAL DATA:  82 year old male with right side weakness aphasia and trouble swallowing. Initial encounter. Left paracentral brainstem infarct on recent MRI. EXAM: CT ANGIOGRAPHY HEAD AND NECK TECHNIQUE: Multidetector CT imaging of the head and neck was performed using the standard protocol during bolus administration of intravenous contrast.  Multiplanar CT image reconstructions and MIPs were obtained to evaluate the vascular anatomy. Carotid stenosis measurements (when applicable) are obtained utilizing NASCET criteria, using the distal internal carotid diameter as the denominator.  CONTRAST:  50 mL Isovue 370 COMPARISON:  Head CT without contrast 912 hours today. Brain MRI and intracranial MRA 10/14/2015. FINDINGS: CTA NECK Skeleton: No acute osseous abnormality identified. Fairly advanced cervical spine degeneration. Visualized paranasal sinuses and mastoids are stable and well pneumatized. Other neck: Mild upper lung atelectasis. No superior mediastinal lymphadenopathy. Negative thyroid, larynx, pharynx, parapharyngeal spaces, retropharyngeal space, sublingual space, submandibular glands and parotid glands. No cervical lymphadenopathy. Aortic arch: 4 vessel arch configuration, the left vertebral artery arises directly from the arch. Bulky soft plaque or mural thrombus in the distal arch, primarily distal to the left subclavian origin. Superimposed more ordinary soft and calcified arch atherosclerosis. Right carotid system: Circumferential soft plaque in the right CCA, but no hemodynamically significant stenosis proximal to the right carotid bifurcation. At the bifurcation right ICA origins stenosis up to 55% occurs. Cervical right ICA otherwise is negative. Left carotid system: No left CCA origin stenosis despite soft and calcified plaque. Circumferential soft plaque in the left CCA, including some low-density plaque in the medial vessel at the level of the a hyoid (series 701, image 65). Soft and calcified plaque at the left carotid bifurcation without stenosis. Circumferential soft plaque been affects the left ICA distal to the bulb, but no hemodynamically significant stenosis occurs. Vertebral arteries:Right subclavian artery origin fifty % stenosis with respect to the distal vessel related to soft plaque. Calcified plaque at the right vertebral  artery origin with mild to moderate stenosis. Calcified plaque in the right V1 segment without stenosis. No other right vertebral artery stenosis to the skullbase. The left vertebral artery arises directly from the arch, but its origin and proximal segment are highly stenotic. The V1 segment is diminutive and highly irregular. The left V2 segment is slightly better enhancing. There are tandem moderate to severe stenoses in the left V3 segment, and where the vessel crosses the dura. See intracranial findings below. CTA HEAD Posterior circulation: The dominant distal right vertebral artery demonstrates calcified plaque at the skullbase not resulting in significant stenosis. The right PICA origin is patent. The right vertebral artery supplies the basilar. The distal left vertebral artery is occluded from the C1 level to the left PICA origin. The vessel is poorly reconstituted from the left PICA to the vertebrobasilar junction, with another short segmental occlusion. There is moderate to severe irregularity and stenosis of the proximal basilar artery between the vertebrobasilar junction and right IAC origin as seen on series 703, image 115. This appears more pronounced than on the intracranial MRA yesterday. Basilar remains patent. Posterior communicating arteries are diminutive or absent. SCA and PCA origins are patent although there is bilateral PCA origin moderate to severe stenosis. The left PCA branches then are within normal limits. There is AA severe right PCA P2 segment stenosis beyond which distal right PCA branches are poorly enhancing. This is stable to the MRA finding yesterday. Anterior circulation: Both ICA siphons are patent although there is moderate to severe siphon soft and calcified plaque. There is severe stenosis at the right anterior genu related the calcified plaque. There is moderate to severe stenosis in the left cavernous segment and in the proximal left supraclinoid segment related to soft and  calcified plaque. Both carotid termini remain patent. Both MCA and ACA origins are normal. Anterior communicating artery and bilateral ACA branches are within normal limits. Left MCA M1 segment, bifurcation, and left MCA branches are within normal limits. Right MCA M1 segment, trifurcation, and right proximal right MCA branches are within normal limits.  There is some distal right M2 irregularity and stenosis in the posterior division (series 706, image 20). Venous sinuses: Patent. Anatomic variants: The left vertebral artery arises directly from the arch. Dominant right vertebral artery. IMPRESSION: 1. Negative for intracranial emergent large vessel occlusion. However, the left vertebral artery is severely atherosclerotic throughout its course and is intermittently occluded in the left V4 segment. And there is moderate to severe irregularity and stenosis of the proximal basilar artery which appears somewhat more pronounced than on the MRA yesterday. 2. Additionally there is moderate to severe intracranial atherosclerosis of the bilateral ICA siphons and right PCA P2 segment. 3. Abundant soft plaque in both cervical carotid arteries, with some areas of low-density soft plaque noted. Stenosis up to 55% occurs. 4. Mild to moderate stenosis of the origin of the dominant right vertebral artery. Electronically Signed: By: Genevie Ann M.D. On: 10/16/2015 10:12   Mr Brain Wo Contrast Result Date: 10/14/2015 CLINICAL DATA:  Expressive aphasia. EXAM: MRI HEAD WITHOUT CONTRAST TECHNIQUE: Multiplanar, multiecho pulse sequences of the brain and surrounding structures were obtained without intravenous contrast. COMPARISON:  None. FINDINGS: Acute infarct left pons, moderate in size.  No other acute infarct Mild chronic microvascular ischemic changes in the white matter. Moderate atrophy. Negative for mass or edema.  No shift of the midline structures Pituitary normal in size. Normal orbital contents. Bilateral cataract extraction.  Mucosal edema right maxillary sinus.  Remaining sinuses clear. IMPRESSION: Acute infarct in the left pons. Generalized atrophy with mild chronic microvascular ischemia in the white matter. Electronically Signed   By: Franchot Gallo M.D.   On: 10/14/2015 15:02   [id number] rhythm, rate 56  TELEMETRY: sinus rhythm, earlier today, sinus bradycardia with progressive P-P lengthening followed by pause of 12 seconds   Assessment/Plan: 1.  Asystole Likely vagally mediated with P-P prolongation prior to pause Avoid AVN blocking agents   No acute indication for PPM  2.  CVA Management per primary team  3.  HTN Would treat with non-AVN blocking agents    Signed, Chanetta Marshall, NP 10/20/2015 12:01 PM  EP Attending  Patient seen and examined. Agree with the findings as noted by Chanetta Marshall, NP. The patient has had syncope in the setting of nausea and urge to vomit. He was noted to have a 12 second pause. Would suggest holding his beta blocker, treating nausea aggressively with Zofran, and observe on Tele. If he continues to have brady and syncope, would place a PPM. He denies any h/o syncope in the past. His exam is notable for slurred speech which persists after his pontine stroke.  Mikle Bosworth.D.

## 2015-10-20 NOTE — Progress Notes (Signed)
Respiratory therapy note- Code blue called to 5w. Upon arrival [name] was bing orall suctioned. Moderate amount of gastric contents. BBS equal and diminished, with pulse, EKG being obtained. NRB placed, with 100% fio2. Patient was transported to 2c-07, report given to repsiratory therapist covering that unit. Marland Kitchen

## 2015-10-20 NOTE — Progress Notes (Signed)
Rehab admissions - Noted events over the past two hours.  I had met with patient and his daughter earlier today.  Plan was for inpatient rehab admission for today.  On hold now for inpatient rehab in light of medical decline.  We do have a denial from [facility], but daughter wanted to admit patient anyway and have me pursue expedited appeal.  I will pursue expedited appeal for patient.  I will continue to follow for possible inpatient rehab admission once patient medically stable.  Call me for questions.  #[phone]

## 2015-10-21 ENCOUNTER — Inpatient Hospital Stay (HOSPITAL_COMMUNITY)
Admission: RE | Admit: 2015-10-21 | Discharge: 2015-11-11 | DRG: 056 | Disposition: A | Discharge status: Home Health | Payer: Medicare Other | Source: Intra-hospital | Attending: Physical Medicine & Rehabilitation | Admitting: Physical Medicine & Rehabilitation

## 2015-10-21 ENCOUNTER — Encounter (HOSPITAL_COMMUNITY): Payer: Self-pay | Admitting: *Deleted

## 2015-10-21 ENCOUNTER — Inpatient Hospital Stay (HOSPITAL_COMMUNITY): Payer: Medicare Other

## 2015-10-21 DIAGNOSIS — R319 Hematuria, unspecified: Secondary | ICD-10-CM | POA: Diagnosis not present

## 2015-10-21 DIAGNOSIS — D62 Acute posthemorrhagic anemia: Secondary | ICD-10-CM

## 2015-10-21 DIAGNOSIS — I2510 Atherosclerotic heart disease of native coronary artery without angina pectoris: Secondary | ICD-10-CM | POA: Diagnosis present

## 2015-10-21 DIAGNOSIS — N189 Chronic kidney disease, unspecified: Secondary | ICD-10-CM

## 2015-10-21 DIAGNOSIS — M79643 Pain in unspecified hand: Secondary | ICD-10-CM | POA: Diagnosis not present

## 2015-10-21 DIAGNOSIS — Z86718 Personal history of other venous thrombosis and embolism: Secondary | ICD-10-CM

## 2015-10-21 DIAGNOSIS — E785 Hyperlipidemia, unspecified: Secondary | ICD-10-CM | POA: Diagnosis present

## 2015-10-21 DIAGNOSIS — E118 Type 2 diabetes mellitus with unspecified complications: Secondary | ICD-10-CM | POA: Diagnosis not present

## 2015-10-21 DIAGNOSIS — R3911 Hesitancy of micturition: Secondary | ICD-10-CM

## 2015-10-21 DIAGNOSIS — I63512 Cerebral infarction due to unspecified occlusion or stenosis of left middle cerebral artery: Secondary | ICD-10-CM | POA: Diagnosis not present

## 2015-10-21 DIAGNOSIS — N390 Urinary tract infection, site not specified: Secondary | ICD-10-CM | POA: Diagnosis not present

## 2015-10-21 DIAGNOSIS — I6350 Cerebral infarction due to unspecified occlusion or stenosis of unspecified cerebral artery: Secondary | ICD-10-CM | POA: Diagnosis not present

## 2015-10-21 DIAGNOSIS — I69351 Hemiplegia and hemiparesis following cerebral infarction affecting right dominant side: Principal | ICD-10-CM

## 2015-10-21 DIAGNOSIS — I639 Cerebral infarction, unspecified: Secondary | ICD-10-CM | POA: Diagnosis present

## 2015-10-21 DIAGNOSIS — D72829 Elevated white blood cell count, unspecified: Secondary | ICD-10-CM | POA: Diagnosis not present

## 2015-10-21 DIAGNOSIS — Z888 Allergy status to other drugs, medicaments and biological substances status: Secondary | ICD-10-CM

## 2015-10-21 DIAGNOSIS — R339 Retention of urine, unspecified: Secondary | ICD-10-CM | POA: Diagnosis not present

## 2015-10-21 DIAGNOSIS — I10 Essential (primary) hypertension: Secondary | ICD-10-CM | POA: Diagnosis not present

## 2015-10-21 DIAGNOSIS — R110 Nausea: Secondary | ICD-10-CM | POA: Diagnosis not present

## 2015-10-21 DIAGNOSIS — I69391 Dysphagia following cerebral infarction: Secondary | ICD-10-CM

## 2015-10-21 DIAGNOSIS — M6289 Other specified disorders of muscle: Secondary | ICD-10-CM | POA: Diagnosis not present

## 2015-10-21 DIAGNOSIS — E1151 Type 2 diabetes mellitus with diabetic peripheral angiopathy without gangrene: Secondary | ICD-10-CM | POA: Diagnosis present

## 2015-10-21 DIAGNOSIS — E1122 Type 2 diabetes mellitus with diabetic chronic kidney disease: Secondary | ICD-10-CM | POA: Diagnosis present

## 2015-10-21 DIAGNOSIS — F329 Major depressive disorder, single episode, unspecified: Secondary | ICD-10-CM | POA: Diagnosis not present

## 2015-10-21 DIAGNOSIS — T380X5A Adverse effect of glucocorticoids and synthetic analogues, initial encounter: Secondary | ICD-10-CM

## 2015-10-21 DIAGNOSIS — I69322 Dysarthria following cerebral infarction: Secondary | ICD-10-CM

## 2015-10-21 DIAGNOSIS — I129 Hypertensive chronic kidney disease with stage 1 through stage 4 chronic kidney disease, or unspecified chronic kidney disease: Secondary | ICD-10-CM | POA: Diagnosis present

## 2015-10-21 DIAGNOSIS — I739 Peripheral vascular disease, unspecified: Secondary | ICD-10-CM

## 2015-10-21 DIAGNOSIS — R1310 Dysphagia, unspecified: Secondary | ICD-10-CM | POA: Diagnosis present

## 2015-10-21 DIAGNOSIS — N183 Chronic kidney disease, stage 3 unspecified: Secondary | ICD-10-CM

## 2015-10-21 DIAGNOSIS — I638 Other cerebral infarction: Secondary | ICD-10-CM

## 2015-10-21 DIAGNOSIS — G8191 Hemiplegia, unspecified affecting right dominant side: Secondary | ICD-10-CM | POA: Diagnosis not present

## 2015-10-21 DIAGNOSIS — I6302 Cerebral infarction due to thrombosis of basilar artery: Secondary | ICD-10-CM

## 2015-10-21 DIAGNOSIS — M1990 Unspecified osteoarthritis, unspecified site: Secondary | ICD-10-CM | POA: Diagnosis not present

## 2015-10-21 DIAGNOSIS — M25521 Pain in right elbow: Secondary | ICD-10-CM

## 2015-10-21 DIAGNOSIS — Z86711 Personal history of pulmonary embolism: Secondary | ICD-10-CM

## 2015-10-21 DIAGNOSIS — F4321 Adjustment disorder with depressed mood: Secondary | ICD-10-CM | POA: Diagnosis present

## 2015-10-21 DIAGNOSIS — E1149 Type 2 diabetes mellitus with other diabetic neurological complication: Secondary | ICD-10-CM

## 2015-10-21 DIAGNOSIS — E1140 Type 2 diabetes mellitus with diabetic neuropathy, unspecified: Secondary | ICD-10-CM | POA: Diagnosis not present

## 2015-10-21 DIAGNOSIS — Z8674 Personal history of sudden cardiac arrest: Secondary | ICD-10-CM

## 2015-10-21 DIAGNOSIS — R4701 Aphasia: Secondary | ICD-10-CM

## 2015-10-21 DIAGNOSIS — I455 Other specified heart block: Secondary | ICD-10-CM

## 2015-10-21 DIAGNOSIS — F09 Unspecified mental disorder due to known physiological condition: Secondary | ICD-10-CM | POA: Diagnosis present

## 2015-10-21 DIAGNOSIS — R58 Hemorrhage, not elsewhere classified: Secondary | ICD-10-CM

## 2015-10-21 DIAGNOSIS — Z79899 Other long term (current) drug therapy: Secondary | ICD-10-CM

## 2015-10-21 DIAGNOSIS — R001 Bradycardia, unspecified: Secondary | ICD-10-CM | POA: Diagnosis present

## 2015-10-21 DIAGNOSIS — M109 Gout, unspecified: Secondary | ICD-10-CM | POA: Diagnosis present

## 2015-10-21 DIAGNOSIS — M10041 Idiopathic gout, right hand: Secondary | ICD-10-CM

## 2015-10-21 DIAGNOSIS — R739 Hyperglycemia, unspecified: Secondary | ICD-10-CM | POA: Diagnosis not present

## 2015-10-21 DIAGNOSIS — F32A Depression, unspecified: Secondary | ICD-10-CM

## 2015-10-21 DIAGNOSIS — Z7982 Long term (current) use of aspirin: Secondary | ICD-10-CM

## 2015-10-21 DIAGNOSIS — E119 Type 2 diabetes mellitus without complications: Secondary | ICD-10-CM

## 2015-10-21 DIAGNOSIS — K5900 Constipation, unspecified: Secondary | ICD-10-CM | POA: Diagnosis not present

## 2015-10-21 DIAGNOSIS — R531 Weakness: Secondary | ICD-10-CM

## 2015-10-21 LAB — CBC WITH DIFFERENTIAL/PLATELET
Basophils Absolute: 0 10*3/uL (ref 0.0–0.1)
Basophils Relative: 0 %
EOS PCT: 0 %
Eosinophils Absolute: 0 10*3/uL (ref 0.0–0.7)
HCT: 47.5 % (ref 39.0–52.0)
Hemoglobin: 15.9 g/dL (ref 13.0–17.0)
LYMPHS ABS: 0.6 10*3/uL — AB (ref 0.7–4.0)
LYMPHS PCT: 6 %
MCH: 30.3 pg (ref 26.0–34.0)
MCHC: 33.5 g/dL (ref 30.0–36.0)
MCV: 90.5 fL (ref 78.0–100.0)
MONO ABS: 0.3 10*3/uL (ref 0.1–1.0)
MONOS PCT: 3 %
Neutro Abs: 8.5 10*3/uL — ABNORMAL HIGH (ref 1.7–7.7)
Neutrophils Relative %: 91 %
PLATELETS: 155 10*3/uL (ref 150–400)
RBC: 5.25 MIL/uL (ref 4.22–5.81)
RDW: 13.6 % (ref 11.5–15.5)
WBC: 9.4 10*3/uL (ref 4.0–10.5)

## 2015-10-21 LAB — BASIC METABOLIC PANEL
Anion gap: 8 (ref 5–15)
BUN: 17 mg/dL (ref 6–20)
CALCIUM: 8.9 mg/dL (ref 8.9–10.3)
CO2: 20 mmol/L — ABNORMAL LOW (ref 22–32)
CREATININE: 1.39 mg/dL — AB (ref 0.61–1.24)
Chloride: 108 mmol/L (ref 101–111)
GFR calc Af Amer: 51 mL/min — ABNORMAL LOW (ref >60)
GFR, EST NON AFRICAN AMERICAN: 44 mL/min — AB (ref >60)
GLUCOSE: 124 mg/dL — AB (ref 65–99)
POTASSIUM: 4.4 mmol/L (ref 3.5–5.1)
SODIUM: 136 mmol/L (ref 135–145)

## 2015-10-21 LAB — COMPREHENSIVE METABOLIC PANEL
ALT: 46 U/L (ref 17–63)
ANION GAP: 8 (ref 5–15)
AST: 28 U/L (ref 15–41)
Albumin: 3.4 g/dL — ABNORMAL LOW (ref 3.5–5.0)
Alkaline Phosphatase: 52 U/L (ref 38–126)
BILIRUBIN TOTAL: 0.8 mg/dL (ref 0.3–1.2)
BUN: 18 mg/dL (ref 6–20)
CALCIUM: 9.2 mg/dL (ref 8.9–10.3)
CHLORIDE: 104 mmol/L (ref 101–111)
CO2: 22 mmol/L (ref 22–32)
CREATININE: 1.44 mg/dL — AB (ref 0.61–1.24)
GFR, EST AFRICAN AMERICAN: 49 mL/min — AB (ref >60)
GFR, EST NON AFRICAN AMERICAN: 42 mL/min — AB (ref >60)
GLUCOSE: 233 mg/dL — AB (ref 65–99)
Potassium: 4.2 mmol/L (ref 3.5–5.1)
Sodium: 134 mmol/L — ABNORMAL LOW (ref 135–145)
Total Protein: 7.1 g/dL (ref 6.5–8.1)

## 2015-10-21 LAB — GLUCOSE, CAPILLARY
GLUCOSE-CAPILLARY: 103 mg/dL — AB (ref 65–99)
GLUCOSE-CAPILLARY: 119 mg/dL — AB (ref 65–99)
GLUCOSE-CAPILLARY: 241 mg/dL — AB (ref 65–99)
Glucose-Capillary: 124 mg/dL — ABNORMAL HIGH (ref 65–99)
Glucose-Capillary: 264 mg/dL — ABNORMAL HIGH (ref 65–99)

## 2015-10-21 LAB — TSH: TSH: 1.852 u[IU]/mL (ref 0.350–4.500)

## 2015-10-21 MED ORDER — PREDNISONE 20 MG PO TABS
40.0000 mg | ORAL_TABLET | Freq: Every day | ORAL | Status: DC
  Administered 2015-10-22 – 2015-10-25 (×4): 40 mg via ORAL
  Filled 2015-10-21 (×5): qty 2

## 2015-10-21 MED ORDER — INSULIN ASPART 100 UNIT/ML ~~LOC~~ SOLN: 0.0000 [IU] | Freq: Three times a day (TID) | SUBCUTANEOUS | Status: DC

## 2015-10-21 MED ORDER — CLOPIDOGREL BISULFATE 75 MG PO TABS
75.0000 mg | ORAL_TABLET | Freq: Every day | ORAL | Status: DC
  Administered 2015-10-22 – 2015-11-11 (×21): 75 mg via ORAL
  Filled 2015-10-21 (×21): qty 1

## 2015-10-21 MED ORDER — ONDANSETRON HCL 4 MG/2ML IJ SOLN
4.0000 mg | Freq: Four times a day (QID) | INTRAMUSCULAR | Status: DC | PRN
  Administered 2015-10-26 – 2015-10-27 (×2): 4 mg via INTRAVENOUS
  Filled 2015-10-21 (×2): qty 2

## 2015-10-21 MED ORDER — HYDRALAZINE HCL 10 MG PO TABS: 10.0000 mg | ORAL_TABLET | Freq: Four times a day (QID) | ORAL | Status: DC

## 2015-10-21 MED ORDER — BISACODYL 10 MG RE SUPP: 10.0000 mg | Freq: Every day | RECTAL | Status: DC | PRN

## 2015-10-21 MED ORDER — COLCHICINE 0.6 MG PO TABS
1.2000 mg | ORAL_TABLET | Freq: Once | ORAL | Status: AC
  Administered 2015-10-21: 1.2 mg via ORAL
  Filled 2015-10-21: qty 2

## 2015-10-21 MED ORDER — INSULIN ASPART 100 UNIT/ML ~~LOC~~ SOLN: 0.0000 [IU] | SUBCUTANEOUS | Status: DC

## 2015-10-21 MED ORDER — ACETAMINOPHEN 325 MG PO TABS
650.0000 mg | ORAL_TABLET | Freq: Four times a day (QID) | ORAL | Status: DC | PRN
  Administered 2015-10-26 – 2015-11-08 (×7): 650 mg via ORAL
  Filled 2015-10-21 (×7): qty 2

## 2015-10-21 MED ORDER — SORBITOL 70 % SOLN
30.0000 mL | Freq: Every day | Status: DC | PRN
  Administered 2015-10-29: 30 mL via ORAL
  Filled 2015-10-21: qty 30

## 2015-10-21 MED ORDER — HYDRALAZINE HCL 10 MG PO TABS
10.0000 mg | ORAL_TABLET | Freq: Four times a day (QID) | ORAL | Status: DC
  Administered 2015-10-22 – 2015-10-23 (×6): 10 mg via ORAL
  Filled 2015-10-21 (×6): qty 1

## 2015-10-21 MED ORDER — ASPIRIN 325 MG PO TABS: 325.0000 mg | ORAL_TABLET | Freq: Every day | ORAL | Status: DC

## 2015-10-21 MED ORDER — INSULIN ASPART 100 UNIT/ML ~~LOC~~ SOLN
0.0000 [IU] | Freq: Three times a day (TID) | SUBCUTANEOUS | Status: DC
  Administered 2015-10-22: 5 [IU] via SUBCUTANEOUS
  Administered 2015-10-22: 2 [IU] via SUBCUTANEOUS
  Administered 2015-10-23: 1 [IU] via SUBCUTANEOUS
  Administered 2015-10-23: 5 [IU] via SUBCUTANEOUS
  Administered 2015-10-24: 2 [IU] via SUBCUTANEOUS
  Administered 2015-10-24: 3 [IU] via SUBCUTANEOUS
  Administered 2015-10-24 – 2015-10-25 (×2): 2 [IU] via SUBCUTANEOUS
  Administered 2015-10-25: 7 [IU] via SUBCUTANEOUS
  Administered 2015-10-26: 3 [IU] via SUBCUTANEOUS
  Administered 2015-10-26: 5 [IU] via SUBCUTANEOUS
  Administered 2015-10-27: 2 [IU] via SUBCUTANEOUS
  Administered 2015-10-27: 3 [IU] via SUBCUTANEOUS
  Administered 2015-10-27: 7 [IU] via SUBCUTANEOUS
  Administered 2015-10-28 (×3): 2 [IU] via SUBCUTANEOUS
  Administered 2015-10-29: 3 [IU] via SUBCUTANEOUS
  Administered 2015-10-29 – 2015-10-30 (×3): 2 [IU] via SUBCUTANEOUS
  Administered 2015-10-30: 3 [IU] via SUBCUTANEOUS
  Administered 2015-10-30 – 2015-10-31 (×2): 2 [IU] via SUBCUTANEOUS
  Administered 2015-10-31: 5 [IU] via SUBCUTANEOUS
  Administered 2015-11-01 (×2): 2 [IU] via SUBCUTANEOUS
  Administered 2015-11-02: 3 [IU] via SUBCUTANEOUS
  Administered 2015-11-02: 2 [IU] via SUBCUTANEOUS
  Administered 2015-11-03: 1 [IU] via SUBCUTANEOUS
  Administered 2015-11-03: 2 [IU] via SUBCUTANEOUS
  Administered 2015-11-03: 7 [IU] via SUBCUTANEOUS
  Administered 2015-11-04 (×2): 3 [IU] via SUBCUTANEOUS
  Administered 2015-11-04: 1 [IU] via SUBCUTANEOUS
  Administered 2015-11-05: 3 [IU] via SUBCUTANEOUS
  Administered 2015-11-05 – 2015-11-08 (×5): 1 [IU] via SUBCUTANEOUS
  Administered 2015-11-09 (×2): 5 [IU] via SUBCUTANEOUS
  Administered 2015-11-10: 1 [IU] via SUBCUTANEOUS
  Administered 2015-11-10: 9 [IU] via SUBCUTANEOUS
  Administered 2015-11-10: 1 [IU] via SUBCUTANEOUS

## 2015-10-21 MED ORDER — POLYETHYLENE GLYCOL 3350 17 G PO PACK
17.0000 g | PACK | Freq: Every day | ORAL | Status: DC
  Administered 2015-10-22 – 2015-10-28 (×7): 17 g via ORAL
  Filled 2015-10-21 (×7): qty 1

## 2015-10-21 MED ORDER — DOCUSATE SODIUM 100 MG PO CAPS
100.0000 mg | ORAL_CAPSULE | Freq: Two times a day (BID) | ORAL | Status: DC
  Administered 2015-10-21 – 2015-11-11 (×42): 100 mg via ORAL
  Filled 2015-10-21 (×42): qty 1

## 2015-10-21 MED ORDER — CETYLPYRIDINIUM CHLORIDE 0.05 % MT LIQD: 7.0000 mL | Freq: Two times a day (BID) | OROMUCOSAL | Status: DC

## 2015-10-21 MED ORDER — ASPIRIN 300 MG RE SUPP
300.0000 mg | Freq: Every day | RECTAL | Status: DC
  Filled 2015-10-21 (×5): qty 1

## 2015-10-21 MED ORDER — ASPIRIN 325 MG PO TABS
325.0000 mg | ORAL_TABLET | Freq: Every day | ORAL | Status: DC
  Administered 2015-10-22 – 2015-11-11 (×21): 325 mg via ORAL
  Filled 2015-10-21 (×21): qty 1

## 2015-10-21 MED ORDER — PRAVASTATIN SODIUM 20 MG PO TABS: 20.0000 mg | ORAL_TABLET | Freq: Every day | ORAL | Status: DC

## 2015-10-21 MED ORDER — CLOPIDOGREL BISULFATE 75 MG PO TABS: 75.0000 mg | ORAL_TABLET | Freq: Every day | ORAL | Status: DC

## 2015-10-21 MED ORDER — PREDNISONE 20 MG PO TABS
40.0000 mg | ORAL_TABLET | Freq: Every day | ORAL | Status: DC
  Administered 2015-10-21: 40 mg via ORAL
  Filled 2015-10-21: qty 2

## 2015-10-21 MED ORDER — PRAVASTATIN SODIUM 20 MG PO TABS
20.0000 mg | ORAL_TABLET | Freq: Every day | ORAL | Status: DC
  Administered 2015-10-22 – 2015-11-10 (×20): 20 mg via ORAL
  Filled 2015-10-21 (×20): qty 1

## 2015-10-21 MED ORDER — HYDRALAZINE HCL 10 MG PO TABS
10.0000 mg | ORAL_TABLET | Freq: Four times a day (QID) | ORAL | Status: DC
  Administered 2015-10-21 (×2): 10 mg via ORAL
  Filled 2015-10-21 (×2): qty 1

## 2015-10-21 MED ORDER — HEPARIN SODIUM (PORCINE) 5000 UNIT/ML IJ SOLN: 5000.0000 [IU] | Freq: Three times a day (TID) | INTRAMUSCULAR | Status: DC

## 2015-10-21 MED ORDER — HEPARIN SODIUM (PORCINE) 5000 UNIT/ML IJ SOLN
5000.0000 [IU] | Freq: Three times a day (TID) | INTRAMUSCULAR | Status: DC
  Administered 2015-10-21 – 2015-10-23 (×7): 5000 [IU] via SUBCUTANEOUS
  Filled 2015-10-21 (×9): qty 1

## 2015-10-21 MED ORDER — SENNOSIDES-DOCUSATE SODIUM 8.6-50 MG PO TABS
1.0000 | ORAL_TABLET | Freq: Every evening | ORAL | Status: DC | PRN
  Administered 2015-10-29: 1 via ORAL
  Filled 2015-10-21: qty 1

## 2015-10-21 MED ORDER — PREDNISONE 20 MG PO TABS: 40.0000 mg | ORAL_TABLET | Freq: Every day | ORAL | Status: DC

## 2015-10-21 MED ORDER — ONDANSETRON HCL 4 MG PO TABS
4.0000 mg | ORAL_TABLET | Freq: Four times a day (QID) | ORAL | Status: DC | PRN
  Administered 2015-10-27 – 2015-11-07 (×13): 4 mg via ORAL
  Filled 2015-10-21 (×13): qty 1

## 2015-10-21 MED ORDER — COLCHICINE 0.6 MG PO TABS
0.6000 mg | ORAL_TABLET | Freq: Once | ORAL | Status: AC
  Administered 2015-10-21: 0.6 mg via ORAL
  Filled 2015-10-21: qty 1

## 2015-10-21 MED FILL — Medication: Qty: 1 | Status: AC

## 2015-10-21 NOTE — Interval H&P Note (Signed)
[name] was admitted today to Inpatient Rehabilitation with the diagnosis of left pontine infarct.  The patient's history has been reviewed, patient examined, and there is no change in status.  Patient continues to be appropriate for intensive inpatient rehabilitation.  I have reviewed the patient's chart and labs.  Questions were answered to the patient's satisfaction. The PAPE has been reviewed and assessment remains appropriate.  Ankit Lorie Phenix 10/21/2015, 9:59 PM

## 2015-10-21 NOTE — Progress Notes (Signed)
Meredith Staggers, MD Physician Signed Physical Medicine and Rehabilitation  Consult Note Date of Service: 10/16/2015 8:36 AM  Related encounter: ED to Hosp-Admission (Current) from 10/14/2015 in [place] Collapse All   [] Hide copied text [] Hover for attribution information      Physical Medicine and Rehabilitation Consult  Reason for Consult: Slurred speech and difficulty talking.   Referring Physician: Dr. Coralyn Pear   HPI: [name] is a 82 y.o. male with history of CAD, SVT, CKD, T2DM PVD with RLE claudication who was admitted on 10/14/15 with difficulty talking, difficulty moving RLE and difficulty with RUE coordination.  Patient had stopped taking his ASA 8/13 in anticipation of upcoming dental procedure. UDS negative  MRI brain done revealing acute left pontine infarct, generalized atrophy, moderate narrowing midportion of basilar artery and multifocal moderate to severe narrowing of intracranial internal carotid arteries L > R.Carotid dopplers without significant stenosis.  Cardiac echo with EF [id number], normal systolic function and mild MVR/AVR.  [name] recommended increasing ASA 325 mg with plavix X 3 months followed by plavix alone due to BA stenosis. BP goal 130 -150 due to BA stenosis. OT evaluations done yesterday and CIR recommended for balance deficits. .   Review of Systems  Constitutional: Negative for fever.  Eyes: Positive for blurred vision.  Respiratory: Negative for cough.   Cardiovascular: Negative for chest pain.  Gastrointestinal: Negative.   Genitourinary: Negative.   Musculoskeletal: Positive for joint pain. Negative for falls, myalgias and neck pain.  Skin: Negative for rash.  Neurological: Positive for dizziness and focal weakness. Negative for headaches.  Endo/Heme/Allergies: Negative.   Psychiatric/Behavioral: Negative.   All other systems reviewed and are negative.     Past Medical History:    Diagnosis Date  . Adenomatous polyp   . CAD (coronary artery disease)   . Chronic kidney disease   . Diverticulosis   . DVT (deep venous thrombosis) ([place])   . Gout   . History of pulmonary embolus (PE)    from a DVT in 2008  . Hyperglycemia   . Hyperlipidemia   . Hypertension   . Mallory-Weiss tear   . Peripheral vascular disease ([place])   . SVT (supraventricular tachycardia) ([place])   . Vitamin B12 deficiency          Past Surgical History:  Procedure Laterality Date  .  PTCA of coronary lesion Right 1995  . APPENDECTOMY    . PR VEIN BYPASS GRAFT,AORTO-FEM-POP Left    femoral-popliteal BP by Dr. Drucie Opitz  . repair of aal fissure   1978          Family History  Problem Relation Age of Onset  . Cancer Mother     bladder  . CAD Father   . Heart attack Father     Social History:  Widowed. Retired Air traffic controller.  He drives and babysit's his granddaughter daily. Per reports that he has never smoked. He has never used smokeless tobacco. He reports that he does not drink alcohol or use drugs.         Allergies  Allergen Reactions  . Lipitor [Atorvastatin]     Muscle pain  . Zetia [Ezetimibe] Hives          Medications Prior to Admission  Medication Sig Dispense Refill  . amoxicillin (AMOXIL) 500 MG capsule Take 500 mg by mouth every 6 (six) hours.     Marland Kitchen aspirin 81 MG tablet Take  81 mg by mouth daily.    Marland Kitchen losartan (COZAAR) 50 MG tablet Take 50 mg by mouth daily.    . metoprolol tartrate (LOPRESSOR) 25 MG tablet Take 25 mg by mouth 2 (two) times daily.      Home: Home Living Family/patient expects to be discharged to:: Private residence Living Arrangements: Alone Available Help at Discharge: Family Type of Home: [place]: [place] - 2 wheels Additional Comments: Unable to obtain as pt was very emotional and crying when asked about home  Lives With: Alone  Functional History: Prior  Function Level of Independence: Independent Functional Status:  Mobility: Bed Mobility Overal bed mobility: Needs Assistance Bed Mobility: Supine to Sit, Sit to Supine Supine to sit: Min assist Sit to supine: Min assist General bed mobility comments: Min assist for trunk support to come to sitting. Assist to reposition in bed as well.  Transfers Overall transfer level: Needs assistance Equipment used: Rolling walker (2 wheeled), None Transfers: Sit to/from Stand Sit to Stand: Min assist General transfer comment: Min assist for boost to stand to stabilize balance upon standing. Initial sit-stand pt attempted to stand without RW and was unable to take steps without UE support from therapist. Pt performed much better with RW and required min guard assist for short-distance ambulation.      ADL:    Cognition: Cognition Overall Cognitive Status: Difficult to assess Arousal/Alertness: Awake/alert Orientation Level: Oriented X4 Attention: Sustained Sustained Attention: Appears intact Memory: Appears intact Awareness: Appears intact Problem Solving: Appears intact Cognition Arousal/Alertness: Awake/alert Behavior During Therapy:  (emotionally labile) Overall Cognitive Status: Difficult to assess Difficult to assess due to:  (Pt emotional and crying throughout session)  Blood pressure (!) 202/73, pulse 62, temperature 97.6 F (36.4 C), temperature source Oral, resp. rate 17, height 5\' 7"  (1.702 m), weight 80.4 kg (177 lb 3.2 oz), SpO2 96 %. Physical Exam  Constitutional: He appears well-developed.  HENT:  Head: Normocephalic.  Eyes: Pupils are equal, round, and reactive to light.  Neck: Normal range of motion.  Cardiovascular: Normal rate.   Respiratory: Effort normal.  GI: Soft.  Musculoskeletal: Normal range of motion.  Neurological:  Speech slurred. Has word finding deficits and delays with processing. RUE 4 to 4+/5 with decreased [place]. RLE 4+/5. No gross sensory abnl.    Psychiatric: He has a normal mood and affect. His behavior is normal.    Lab Results Last 24 Hours       Results for orders placed or performed during the hospital encounter of 10/14/15 (from the past 24 hour(s))  Glucose, capillary     Status: Abnormal   Collection Time: 10/15/15 12:40 PM  Result Value Ref Range   Glucose-Capillary 160 (H) 65 - 99 mg/dL  Glucose, capillary     Status: Abnormal   Collection Time: 10/15/15  5:07 PM  Result Value Ref Range   Glucose-Capillary 148 (H) 65 - 99 mg/dL  Glucose, capillary     Status: Abnormal   Collection Time: 10/15/15  8:10 PM  Result Value Ref Range   Glucose-Capillary 195 (H) 65 - 99 mg/dL  Glucose, capillary     Status: Abnormal   Collection Time: 10/16/15 12:27 AM  Result Value Ref Range   Glucose-Capillary 132 (H) 65 - 99 mg/dL  Glucose, capillary     Status: Abnormal   Collection Time: 10/16/15  4:36 AM  Result Value Ref Range   Glucose-Capillary 116 (H) 65 - 99 mg/dL  Glucose, capillary     Status:  Abnormal   Collection Time: 10/16/15  7:49 AM  Result Value Ref Range   Glucose-Capillary 159 (H) 65 - 99 mg/dL      Imaging Results (Last 48 hours)  Mr Brain Wo Contrast  Result Date: 10/14/2015 CLINICAL DATA:  Expressive aphasia. EXAM: MRI HEAD WITHOUT CONTRAST TECHNIQUE: Multiplanar, multiecho pulse sequences of the brain and surrounding structures were obtained without intravenous contrast. COMPARISON:  None. FINDINGS: Acute infarct left pons, moderate in size.  No other acute infarct Mild chronic microvascular ischemic changes in the white matter. Moderate atrophy. Negative for mass or edema.  No shift of the midline structures Pituitary normal in size. Normal orbital contents. Bilateral cataract extraction. Mucosal edema right maxillary sinus.  Remaining sinuses clear. IMPRESSION: Acute infarct in the left pons. Generalized atrophy with mild chronic microvascular ischemia in the white matter. Electronically  Signed   By: Franchot Gallo M.D.   On: 10/14/2015 15:02   Mr Jodene Nam Head/brain [id number] Cm  Result Date: 10/14/2015 CLINICAL DATA:  Expressive aphasia EXAM: MRA HEAD WITHOUT CONTRAST TECHNIQUE: Angiographic images of the Circle of Willis were obtained using MRA technique without intravenous contrast. COMPARISON:  Same day brain MRI FINDINGS: Intracranial internal carotid arteries: There is moderate to severe narrowing of the intracranial left internal carotid artery, worst at the cavernous and clinoid segments. There is multifocal moderate atherosclerotic narrowing of the intracranial right ICA. Anterior cerebral arteries: Normal. Middle cerebral arteries: Possible 1.5 cm aneurysm versus atherosclerotic irregularity at the left P-comm origin. Otherwise normal. Posterior communicating arteries: Absent bilaterally Posterior cerebral arteries: Normal. Basilar artery: There is moderate narrowing of the midportion of the basilar artery. Vertebral arteries: Right-dominant. Minimal flow related enhancement seen within the proximal V4 segment of the left vertebral artery. Superior cerebellar arteries: Normal. Anterior inferior cerebellar arteries: Normal. Posterior inferior cerebellar arteries: Normal. IMPRESSION: 1. Moderate narrowing of the midportion of the basilar artery, likely secondary to atherosclerotic disease. 2. Multifocal moderate to severe narrowing of the intracranial internal carotid arteries, left worse than right. 3. Possible 1.5 mm aneurysm versus atherosclerotic irregularity at the left posterior communicating artery origin. Electronically Signed   By: Ulyses Jarred M.D.   On: 10/14/2015 20:28     Assessment/Plan: Diagnosis: left pontine infarct with right hemiparesis dysarthria/dysphagia/processing delays 1. Does the need for close, 24 hr/day medical supervision in concert with the patient's rehab needs make it unreasonable for this patient to be served in a less intensive setting?  Yes 2. Co-Morbidities requiring supervision/potential complications: HTn, PVD, DM2 3. Due to bladder management, bowel management, safety, skin/wound care, disease management, medication administration, pain management and patient education, does the patient require 24 hr/day rehab nursing? Yes 4. Does the patient require coordinated care of a physician, rehab nurse, PT (1-2 hrs/day, 5 days/week), OT (1-2 hrs/day, 5 days/week) and SLP (1-2 hrs/day, 5 days/week) to address physical and functional deficits in the context of the above medical diagnosis(es)? Yes Addressing deficits in the following areas: balance, endurance, locomotion, strength, transferring, bowel/bladder control, bathing, dressing, feeding, grooming, toileting, cognition, speech, language, swallowing and psychosocial support 5. Can the patient actively participate in an intensive therapy program of at least 3 hrs of therapy per day at least 5 days per week? Yes 6. The potential for patient to make measurable gains while on inpatient rehab is excellent 7. Anticipated functional outcomes upon discharge from inpatient rehab are modified independent  with PT, modified independent with OT, modified independent and supervision with SLP. 8. Estimated rehab length of stay to reach  the above functional goals is: 7-10 days 9. Does the patient have adequate social supports and living environment to accommodate these discharge functional goals? Yes 10. Anticipated D/C setting: Home 11. Anticipated post D/C treatments: HH therapy and Outpatient therapy 12. Overall Rehab/Functional Prognosis: excellent  RECOMMENDATIONS: This patient's condition is appropriate for continued rehabilitative care in the following setting: CIR Patient has agreed to participate in recommended program. Yes Note that insurance prior authorization may be required for reimbursement for recommended care.  Comment: Rehab Admissions Coordinator to follow  up.  Thanks,  Meredith Staggers, MD, Mellody Drown     10/16/2015    Revision History

## 2015-10-21 NOTE — Progress Notes (Addendum)
Patient ID: [name], male   DOB: Jul 10, 1927, 82 y.o.   MRN: [id number]    Patient Name: [name] Date of Encounter: 10/21/2015     Principal Problem:   CVA (cerebral vascular accident) [facility]) Active Problems:   Peripheral vascular disease, unspecified ([place])   CVA (cerebral infarction)   Accelerated hypertension   Type 2 diabetes mellitus ([place])   HLD (hyperlipidemia)   Essential hypertension   Orthostatic hypotension   Dysphagia   Aphasia    SUBJECTIVE  Feels better. Nausea resolved.  CURRENT MEDS . amoxicillin  500 mg Oral Q8H  . antiseptic oral rinse  7 mL Mouth Rinse BID  . aspirin  300 mg Rectal Daily   Or  . aspirin  325 mg Oral Daily  . clopidogrel  75 mg Oral Daily  . docusate sodium  100 mg Oral BID  . heparin  5,000 Units Subcutaneous Q8H  . insulin aspart  0-9 Units Subcutaneous Q4H  . polyethylene glycol  17 g Oral Daily  . pravastatin  20 mg Oral q1800    OBJECTIVE  Vitals:   10/20/15 2042 10/20/15 2359 10/21/15 0426 10/21/15 0814  BP: (!) 174/70 (!) 175/68 (!) 180/79 (!) 189/70  Pulse: (!) 58 (!) 55 64 71  Resp: 16 10 20 19   Temp: 98.6 F (37 C) 97.9 F (36.6 C) 98.5 F (36.9 C) 98.9 F (37.2 C)  TempSrc: Oral Oral Oral Oral  SpO2: 100% 100% 100% 99%  Weight:      Height:        Intake/Output Summary (Last 24 hours) at 10/21/15 0858 Last data filed at 10/21/15 [id number]  Gross per 24 hour  Intake           1362.5 ml  Output             1550 ml  Net           -187.5 ml   Filed Weights   10/14/15 1233  Weight: 177 lb 3.2 oz (80.4 kg)    PHYSICAL EXAM  General: Pleasant, 82 yo man, NAD. Neuro: Alert and oriented X 3. Moves all extremities spontaneously. Psych: Normal affect. HEENT:  Normal  Neck: Supple without bruits or JVD. Lungs:  Resp regular and unlabored, CTA. Heart: RRR no s3, s4, or murmurs. Abdomen: Soft, non-tender, non-distended, BS + x 4.  Extremities: No clubbing, cyanosis or edema. DP/PT/Radials 2+ and equal  bilaterally.  Accessory Clinical Findings  CBC No results for input(s): WBC, NEUTROABS, HGB, HCT, MCV, PLT in the last 72 hours. Basic Metabolic Panel No results for input(s): NA, K, CL, CO2, GLUCOSE, BUN, CREATININE, CALCIUM, MG, PHOS in the last 72 hours. Liver Function Tests No results for input(s): AST, ALT, ALKPHOS, BILITOT, PROT, ALBUMIN in the last 72 hours. No results for input(s): LIPASE, AMYLASE in the last 72 hours. Cardiac Enzymes No results for input(s): CKTOTAL, CKMB, CKMBINDEX, TROPONINI in the last 72 hours. BNP Invalid input(s): POCBNP D-Dimer No results for input(s): DDIMER in the last 72 hours. Hemoglobin A1C No results for input(s): HGBA1C in the last 72 hours. Fasting Lipid Panel No results for input(s): CHOL, HDL, LDLCALC, TRIG, CHOLHDL, LDLDIRECT in the last 72 hours. Thyroid Function Tests No results for input(s): TSH, T4TOTAL, T3FREE, THYROIDAB in the last 72 hours.  Invalid input(s): FREET3  TELE  nsr  Radiology/Studies  Ct Angio Head W Or Wo Contrast  Addendum Date: 10/16/2015   ADDENDUM REPORT: 10/16/2015 10:20 ADDENDUM: Study discussed by telephone with Dr. Addison Lank  Johnson on 10/16/2015 At 1014 hours. We also discussed the bulky soft plaque or mural thrombus in the distal arch, which is not strongly suspected to be a factor in the intracranial ischemia given noted is distal to the left subclavian origin. Electronically Signed   By: Genevie Ann M.D.   On: 10/16/2015 10:20   Result Date: 10/16/2015 CLINICAL DATA:  82 year old male with right side weakness aphasia and trouble swallowing. Initial encounter. Left paracentral brainstem infarct on recent MRI. EXAM: CT ANGIOGRAPHY HEAD AND NECK TECHNIQUE: Multidetector CT imaging of the head and neck was performed using the standard protocol during bolus administration of intravenous contrast. Multiplanar CT image reconstructions and MIPs were obtained to evaluate the vascular anatomy. Carotid stenosis  measurements (when applicable) are obtained utilizing NASCET criteria, using the distal internal carotid diameter as the denominator. CONTRAST:  50 mL Isovue 370 COMPARISON:  Head CT without contrast 912 hours today. Brain MRI and intracranial MRA 10/14/2015. FINDINGS: CTA NECK Skeleton: No acute osseous abnormality identified. Fairly advanced cervical spine degeneration. Visualized paranasal sinuses and mastoids are stable and well pneumatized. Other neck: Mild upper lung atelectasis. No superior mediastinal lymphadenopathy. Negative thyroid, larynx, pharynx, parapharyngeal spaces, retropharyngeal space, sublingual space, submandibular glands and parotid glands. No cervical lymphadenopathy. Aortic arch: 4 vessel arch configuration, the left vertebral artery arises directly from the arch. Bulky soft plaque or mural thrombus in the distal arch, primarily distal to the left subclavian origin. Superimposed more ordinary soft and calcified arch atherosclerosis. Right carotid system: Circumferential soft plaque in the right CCA, but no hemodynamically significant stenosis proximal to the right carotid bifurcation. At the bifurcation right ICA origins stenosis up to 55% occurs. Cervical right ICA otherwise is negative. Left carotid system: No left CCA origin stenosis despite soft and calcified plaque. Circumferential soft plaque in the left CCA, including some low-density plaque in the medial vessel at the level of the a hyoid (series 701, image 65). Soft and calcified plaque at the left carotid bifurcation without stenosis. Circumferential soft plaque been affects the left ICA distal to the bulb, but no hemodynamically significant stenosis occurs. Vertebral arteries:Right subclavian artery origin fifty % stenosis with respect to the distal vessel related to soft plaque. Calcified plaque at the right vertebral artery origin with mild to moderate stenosis. Calcified plaque in the right V1 segment without stenosis. No other  right vertebral artery stenosis to the skullbase. The left vertebral artery arises directly from the arch, but its origin and proximal segment are highly stenotic. The V1 segment is diminutive and highly irregular. The left V2 segment is slightly better enhancing. There are tandem moderate to severe stenoses in the left V3 segment, and where the vessel crosses the dura. See intracranial findings below. CTA HEAD Posterior circulation: The dominant distal right vertebral artery demonstrates calcified plaque at the skullbase not resulting in significant stenosis. The right PICA origin is patent. The right vertebral artery supplies the basilar. The distal left vertebral artery is occluded from the C1 level to the left PICA origin. The vessel is poorly reconstituted from the left PICA to the vertebrobasilar junction, with another short segmental occlusion. There is moderate to severe irregularity and stenosis of the proximal basilar artery between the vertebrobasilar junction and right IAC origin as seen on series 703, image 115. This appears more pronounced than on the intracranial MRA yesterday. Basilar remains patent. Posterior communicating arteries are diminutive or absent. SCA and PCA origins are patent although there is bilateral PCA origin moderate to severe  stenosis. The left PCA branches then are within normal limits. There is AA severe right PCA P2 segment stenosis beyond which distal right PCA branches are poorly enhancing. This is stable to the MRA finding yesterday. Anterior circulation: Both ICA siphons are patent although there is moderate to severe siphon soft and calcified plaque. There is severe stenosis at the right anterior genu related the calcified plaque. There is moderate to severe stenosis in the left cavernous segment and in the proximal left supraclinoid segment related to soft and calcified plaque. Both carotid termini remain patent. Both MCA and ACA origins are normal. Anterior communicating  artery and bilateral ACA branches are within normal limits. Left MCA M1 segment, bifurcation, and left MCA branches are within normal limits. Right MCA M1 segment, trifurcation, and right proximal right MCA branches are within normal limits. There is some distal right M2 irregularity and stenosis in the posterior division (series 706, image 20). Venous sinuses: Patent. Anatomic variants: The left vertebral artery arises directly from the arch. Dominant right vertebral artery. IMPRESSION: 1. Negative for intracranial emergent large vessel occlusion. However, the left vertebral artery is severely atherosclerotic throughout its course and is intermittently occluded in the left V4 segment. And there is moderate to severe irregularity and stenosis of the proximal basilar artery which appears somewhat more pronounced than on the MRA yesterday. 2. Additionally there is moderate to severe intracranial atherosclerosis of the bilateral ICA siphons and right PCA P2 segment. 3. Abundant soft plaque in both cervical carotid arteries, with some areas of low-density soft plaque noted. Stenosis up to 55% occurs. 4. Mild to moderate stenosis of the origin of the dominant right vertebral artery. Electronically Signed: By: Genevie Ann M.D. On: 10/16/2015 10:12   Ct Angio Neck W Or Wo Contrast  Addendum Date: 10/16/2015   ADDENDUM REPORT: 10/16/2015 10:20 ADDENDUM: Study discussed by telephone with Dr. Roland Rack on 10/16/2015 At 1014 hours. We also discussed the bulky soft plaque or mural thrombus in the distal arch, which is not strongly suspected to be a factor in the intracranial ischemia given noted is distal to the left subclavian origin. Electronically Signed   By: Genevie Ann M.D.   On: 10/16/2015 10:20   Result Date: 10/16/2015 CLINICAL DATA:  82 year old male with right side weakness aphasia and trouble swallowing. Initial encounter. Left paracentral brainstem infarct on recent MRI. EXAM: CT ANGIOGRAPHY HEAD AND NECK  TECHNIQUE: Multidetector CT imaging of the head and neck was performed using the standard protocol during bolus administration of intravenous contrast. Multiplanar CT image reconstructions and MIPs were obtained to evaluate the vascular anatomy. Carotid stenosis measurements (when applicable) are obtained utilizing NASCET criteria, using the distal internal carotid diameter as the denominator. CONTRAST:  50 mL Isovue 370 COMPARISON:  Head CT without contrast 912 hours today. Brain MRI and intracranial MRA 10/14/2015. FINDINGS: CTA NECK Skeleton: No acute osseous abnormality identified. Fairly advanced cervical spine degeneration. Visualized paranasal sinuses and mastoids are stable and well pneumatized. Other neck: Mild upper lung atelectasis. No superior mediastinal lymphadenopathy. Negative thyroid, larynx, pharynx, parapharyngeal spaces, retropharyngeal space, sublingual space, submandibular glands and parotid glands. No cervical lymphadenopathy. Aortic arch: 4 vessel arch configuration, the left vertebral artery arises directly from the arch. Bulky soft plaque or mural thrombus in the distal arch, primarily distal to the left subclavian origin. Superimposed more ordinary soft and calcified arch atherosclerosis. Right carotid system: Circumferential soft plaque in the right CCA, but no hemodynamically significant stenosis proximal to the right carotid bifurcation.  At the bifurcation right ICA origins stenosis up to 55% occurs. Cervical right ICA otherwise is negative. Left carotid system: No left CCA origin stenosis despite soft and calcified plaque. Circumferential soft plaque in the left CCA, including some low-density plaque in the medial vessel at the level of the a hyoid (series 701, image 65). Soft and calcified plaque at the left carotid bifurcation without stenosis. Circumferential soft plaque been affects the left ICA distal to the bulb, but no hemodynamically significant stenosis occurs. Vertebral  arteries:Right subclavian artery origin fifty % stenosis with respect to the distal vessel related to soft plaque. Calcified plaque at the right vertebral artery origin with mild to moderate stenosis. Calcified plaque in the right V1 segment without stenosis. No other right vertebral artery stenosis to the skullbase. The left vertebral artery arises directly from the arch, but its origin and proximal segment are highly stenotic. The V1 segment is diminutive and highly irregular. The left V2 segment is slightly better enhancing. There are tandem moderate to severe stenoses in the left V3 segment, and where the vessel crosses the dura. See intracranial findings below. CTA HEAD Posterior circulation: The dominant distal right vertebral artery demonstrates calcified plaque at the skullbase not resulting in significant stenosis. The right PICA origin is patent. The right vertebral artery supplies the basilar. The distal left vertebral artery is occluded from the C1 level to the left PICA origin. The vessel is poorly reconstituted from the left PICA to the vertebrobasilar junction, with another short segmental occlusion. There is moderate to severe irregularity and stenosis of the proximal basilar artery between the vertebrobasilar junction and right IAC origin as seen on series 703, image 115. This appears more pronounced than on the intracranial MRA yesterday. Basilar remains patent. Posterior communicating arteries are diminutive or absent. SCA and PCA origins are patent although there is bilateral PCA origin moderate to severe stenosis. The left PCA branches then are within normal limits. There is AA severe right PCA P2 segment stenosis beyond which distal right PCA branches are poorly enhancing. This is stable to the MRA finding yesterday. Anterior circulation: Both ICA siphons are patent although there is moderate to severe siphon soft and calcified plaque. There is severe stenosis at the right anterior genu related  the calcified plaque. There is moderate to severe stenosis in the left cavernous segment and in the proximal left supraclinoid segment related to soft and calcified plaque. Both carotid termini remain patent. Both MCA and ACA origins are normal. Anterior communicating artery and bilateral ACA branches are within normal limits. Left MCA M1 segment, bifurcation, and left MCA branches are within normal limits. Right MCA M1 segment, trifurcation, and right proximal right MCA branches are within normal limits. There is some distal right M2 irregularity and stenosis in the posterior division (series 706, image 20). Venous sinuses: Patent. Anatomic variants: The left vertebral artery arises directly from the arch. Dominant right vertebral artery. IMPRESSION: 1. Negative for intracranial emergent large vessel occlusion. However, the left vertebral artery is severely atherosclerotic throughout its course and is intermittently occluded in the left V4 segment. And there is moderate to severe irregularity and stenosis of the proximal basilar artery which appears somewhat more pronounced than on the MRA yesterday. 2. Additionally there is moderate to severe intracranial atherosclerosis of the bilateral ICA siphons and right PCA P2 segment. 3. Abundant soft plaque in both cervical carotid arteries, with some areas of low-density soft plaque noted. Stenosis up to 55% occurs. 4. Mild to moderate stenosis  of the origin of the dominant right vertebral artery. Electronically Signed: By: Genevie Ann M.D. On: 10/16/2015 10:12   Mr Brain Wo Contrast  Result Date: 10/14/2015 CLINICAL DATA:  Expressive aphasia. EXAM: MRI HEAD WITHOUT CONTRAST TECHNIQUE: Multiplanar, multiecho pulse sequences of the brain and surrounding structures were obtained without intravenous contrast. COMPARISON:  None. FINDINGS: Acute infarct left pons, moderate in size.  No other acute infarct Mild chronic microvascular ischemic changes in the white matter.  Moderate atrophy. Negative for mass or edema.  No shift of the midline structures Pituitary normal in size. Normal orbital contents. Bilateral cataract extraction. Mucosal edema right maxillary sinus.  Remaining sinuses clear. IMPRESSION: Acute infarct in the left pons. Generalized atrophy with mild chronic microvascular ischemia in the white matter. Electronically Signed   By: Franchot Gallo M.D.   On: 10/14/2015 15:02   Mr Brain Ltd W/o Cm  Result Date: 10/17/2015 CLINICAL DATA:  Worsening aphasia and numbness with tingling of the LEFT side of the face. EXAM: MRI HEAD WITHOUT CONTRAST TECHNIQUE: Diffusion and ADC sequences of the brain and surrounding structures were obtained without intravenous contrast. COMPARISON:  MRI brain 10/14/2015.  CTA head neck 10/16/2015. FINDINGS: Only diffusion-weighted imaging was requested. There is moderate worsening of the LEFT paramedian pontine area of restricted diffusion representing acute infarction, as compared with 10/14/2015. There is slight increase in dorsal extension towards the floor of the fourth ventricle, there is more lateral extension towards the brachium pontis, and there is more craniocaudal extension towards the midbrain and inferior pons. No contralateral extension. No areas of cerebellar or cerebral hemispheric acute infarction are observed. IMPRESSION: Moderate worsening of acute LEFT paramedian pontine infarction as compared with 10/14/2015. Electronically Signed   By: Staci Righter M.D.   On: 10/17/2015 15:47   Mr Jodene Nam Head/brain [id number] Cm  Result Date: 10/14/2015 CLINICAL DATA:  Expressive aphasia EXAM: MRA HEAD WITHOUT CONTRAST TECHNIQUE: Angiographic images of the Circle of Willis were obtained using MRA technique without intravenous contrast. COMPARISON:  Same day brain MRI FINDINGS: Intracranial internal carotid arteries: There is moderate to severe narrowing of the intracranial left internal carotid artery, worst at the cavernous and clinoid  segments. There is multifocal moderate atherosclerotic narrowing of the intracranial right ICA. Anterior cerebral arteries: Normal. Middle cerebral arteries: Possible 1.5 cm aneurysm versus atherosclerotic irregularity at the left P-comm origin. Otherwise normal. Posterior communicating arteries: Absent bilaterally Posterior cerebral arteries: Normal. Basilar artery: There is moderate narrowing of the midportion of the basilar artery. Vertebral arteries: Right-dominant. Minimal flow related enhancement seen within the proximal V4 segment of the left vertebral artery. Superior cerebellar arteries: Normal. Anterior inferior cerebellar arteries: Normal. Posterior inferior cerebellar arteries: Normal. IMPRESSION: 1. Moderate narrowing of the midportion of the basilar artery, likely secondary to atherosclerotic disease. 2. Multifocal moderate to severe narrowing of the intracranial internal carotid arteries, left worse than right. 3. Possible 1.5 mm aneurysm versus atherosclerotic irregularity at the left posterior communicating artery origin. Electronically Signed   By: Ulyses Jarred M.D.   On: 10/14/2015 20:28   Ct Head Code Stroke Wo Contrast  Addendum Date: 10/16/2015   ADDENDUM REPORT: 10/16/2015 09:30 ADDENDUM: Study discussed by telephone with Dr. Roland Rack on 10/16/2015 At 0926 hours. Electronically Signed   By: Genevie Ann M.D.   On: 10/16/2015 09:30   Result Date: 10/16/2015 CLINICAL DATA:  Code stroke. 82 year old male with right side weakness aphasia and trouble swallowing. Initial encounter. Left paracentral brainstem infarct on recent MRI. EXAM: CT HEAD  WITHOUT CONTRAST TECHNIQUE: Contiguous axial images were obtained from the base of the skull through the vertex without intravenous contrast. COMPARISON:  Brain MRI and intracranial MRA 10/14/2015 FINDINGS: Visualized paranasal sinuses and mastoids are stable and well pneumatized. No acute osseous abnormality identified. No acute orbit or scalp  soft tissue findings. Extensive Calcified atherosclerosis at the skull base. Mild hypodensity corresponding to the left paracentral pontine infarct seen by MRI. No associated hemorrhage or mass effect. No superimposed acute cortically based infarct identified. No acute intracranial hemorrhage identified. ASPECTS [facility] Stroke Program Early CT Score) Total score (0-10 with 10 being normal): 10. Stable cerebral volume. No ventriculomegaly. Basilar cisterns remain patent. IMPRESSION: 1. Expected CT appearance of the left paracentral brainstem infarct seen by MRI 2 days ago. No associated hemorrhage or mass effect. 2. No superimposed acute cortically based infarct or new intracranial abnormality. 3. ASPECTS is 10. Electronically Signed: By: Genevie Ann M.D. On: 10/16/2015 09:23    ASSESSMENT AND PLAN  1. Sinus arrest due to vagal spell - no additional episodes. Continue to hold/dc beta blocker 2. Syncope due to #1 - as he has never had an episode of syncope, would undergo watchful waiting 3. Stroke - as per neuro/primary team 4. Nausea - use Zofran as needed 5. HTN - his blood pressure is elevated. Consider adding additional meds which would not slow sinus node or AV conduction. Will sign off. Call for questions.  Kristiana Jacko,M.D.  10/21/2015 8:58 AM

## 2015-10-21 NOTE — Progress Notes (Signed)
Rehab admissions - I spoke with Dr. Bonner Puna and I have medical clearance for acute inpatient rehab admission for today.  Was also cleared by cardiology today.  Bed available and will admit to acute inpatient rehab today.  Call me for questions.  RC:[id number]

## 2015-10-21 NOTE — Discharge Summary (Signed)
Physician Discharge Summary  [name] [id number] DOB: June 13, 1927 DOA: 10/14/2015  PCP: Mathews Argyle, MD  Admit date: 10/14/2015 Discharge date: 10/21/2015  Admitted From: Home Disposition:  CIR  Recommendations for Outpatient Follow-up:  1. Follow up with PCP following discharge 2. Follow up with nruology following discharge 3. Monitor BP, hydralazine started. Avoid AV nodal blocking agents.  4. Monitor right wrist, presumed gout flare, started prednisone 40mg  daily 8/23, begin taper at signs of improvement.  5. Continue DAPT for 3 months, plavix monotherapy thereafter monitoring for abnormal bleeding. 6. Monitor for myalgias with pravastatin, history of this with lipitor.  7. Please follow up on SLP recommendations follow barium swallow study 8/23.   Equipment/Devices: None  Discharge Condition: Stable CODE STATUS: FULL Diet recommendation: Dysphagia 3, thin liquid  Brief/Interim Summary: [name] is a pleasant 82 year old gentleman with a past medical history of coronary artery disease, hypertension, gout, diabetes mellitus admitted to medicine service on 10/14/2015 when he presented with complaints of slurred speech, workup included MRI of brain that revealed an acute infarct in the left pons. On 10/16/2015 he reported having worsening difficulties swallowing along with worsening right-sided weakness for which code stroke was called. He underwent a CT scan of the head that was stable. CTA showing left vertebral artery atherosclerotic with moderate to severe irregularity and stenosis of the proximal basilar artery. Neurology recommending treatment with dual agent antiplatelet therapy with aspirin and Plavix for 3 months and Plavix monotherapy thereafter. Plans were made for discharge to CIR. On 10/20/2015 rapid response was called, daughter reported that he became unresponsive. Telemetry showed bradycardia into 40's with pauses up to 11 seconds, he had received morning dose of  metoprolol. He regained consciousness, transferred to SDU without further episodes, remaining in sinus rhythm on telemetry. TSH normal. EP was consulted and recommended watchful waiting, no need for PPM at this time. During this time, his right wrist became swollen, red, and intensely tender consistent with prior gout flares. Colchicine and a steroid burst was started prior to discharge for this, given CKD. Metoprolol was discontinued, and no AV nodal blocking agents are to be restarted. Hydralazine was started on day of discharge for uncontrolled HTN, with plan to lower BP cautiously in setting of recent CVA.   Discharge Diagnoses:  Principal Problem:   CVA (cerebral vascular accident) ([place]) Active Problems:   Peripheral vascular disease, unspecified ([place])   CVA (cerebral infarction)   Accelerated hypertension   Type 2 diabetes mellitus ([place])   HLD (hyperlipidemia)   Essential hypertension   Orthostatic hypotension   Dysphagia   Aphasia  Discharge Instructions  Discharge Instructions    Ambulatory referral to Neurology    Complete by:  As directed   Follow up with NP Cecille Rubin at [facility] in about 2 months. Thanks.     Follow-up Information    MARTIN,NANCY CAROLYN, NP Follow up in 2 month(s).   Specialty:  Family Medicine Contact information: [address] [place] [address] [place], [place] .   Specialty:  Internal Medicine Contact information: [address] [place] [address] [phone]          Allergies  Allergen Reactions  . Lipitor [Atorvastatin]     Muscle pain   . Zetia [Ezetimibe] Hives    Consultations: - [facility] - EP, Dr. [name]  Procedures/Studies: Ct Angio Head W Or Wo Contrast  Addendum Date: 10/16/2015   ADDENDUM REPORT:  10/16/2015 10:20 ADDENDUM: Study discussed by telephone with Dr. Roland Rack on 10/16/2015 At 1014 hours. We also discussed the bulky  soft plaque or mural thrombus in the distal arch, which is not strongly suspected to be a factor in the intracranial ischemia given noted is distal to the left subclavian origin. Electronically Signed   By: Genevie Ann M.D.   On: 10/16/2015 10:20   Result Date: 10/16/2015 CLINICAL DATA:  82 year old male with right side weakness aphasia and trouble swallowing. Initial encounter. Left paracentral brainstem infarct on recent MRI. EXAM: CT ANGIOGRAPHY HEAD AND NECK TECHNIQUE: Multidetector CT imaging of the head and neck was performed using the standard protocol during bolus administration of intravenous contrast. Multiplanar CT image reconstructions and MIPs were obtained to evaluate the vascular anatomy. Carotid stenosis measurements (when applicable) are obtained utilizing NASCET criteria, using the distal internal carotid diameter as the denominator. CONTRAST:  50 mL Isovue 370 COMPARISON:  Head CT without contrast 912 hours today. Brain MRI and intracranial MRA 10/14/2015. FINDINGS: CTA NECK Skeleton: No acute osseous abnormality identified. Fairly advanced cervical spine degeneration. Visualized paranasal sinuses and mastoids are stable and well pneumatized. Other neck: Mild upper lung atelectasis. No superior mediastinal lymphadenopathy. Negative thyroid, larynx, pharynx, parapharyngeal spaces, retropharyngeal space, sublingual space, submandibular glands and parotid glands. No cervical lymphadenopathy. Aortic arch: 4 vessel arch configuration, the left vertebral artery arises directly from the arch. Bulky soft plaque or mural thrombus in the distal arch, primarily distal to the left subclavian origin. Superimposed more ordinary soft and calcified arch atherosclerosis. Right carotid system: Circumferential soft plaque in the right CCA, but no hemodynamically significant stenosis proximal to the right carotid bifurcation. At the bifurcation right ICA origins stenosis up to 55% occurs. Cervical right ICA otherwise  is negative. Left carotid system: No left CCA origin stenosis despite soft and calcified plaque. Circumferential soft plaque in the left CCA, including some low-density plaque in the medial vessel at the level of the a hyoid (series 701, image 65). Soft and calcified plaque at the left carotid bifurcation without stenosis. Circumferential soft plaque been affects the left ICA distal to the bulb, but no hemodynamically significant stenosis occurs. Vertebral arteries:Right subclavian artery origin fifty % stenosis with respect to the distal vessel related to soft plaque. Calcified plaque at the right vertebral artery origin with mild to moderate stenosis. Calcified plaque in the right V1 segment without stenosis. No other right vertebral artery stenosis to the skullbase. The left vertebral artery arises directly from the arch, but its origin and proximal segment are highly stenotic. The V1 segment is diminutive and highly irregular. The left V2 segment is slightly better enhancing. There are tandem moderate to severe stenoses in the left V3 segment, and where the vessel crosses the dura. See intracranial findings below. CTA HEAD Posterior circulation: The dominant distal right vertebral artery demonstrates calcified plaque at the skullbase not resulting in significant stenosis. The right PICA origin is patent. The right vertebral artery supplies the basilar. The distal left vertebral artery is occluded from the C1 level to the left PICA origin. The vessel is poorly reconstituted from the left PICA to the vertebrobasilar junction, with another short segmental occlusion. There is moderate to severe irregularity and stenosis of the proximal basilar artery between the vertebrobasilar junction and right IAC origin as seen on series 703, image 115. This appears more pronounced than on the intracranial MRA yesterday. Basilar remains patent. Posterior communicating arteries are diminutive or absent. SCA and PCA origins are  patent although there is bilateral PCA origin moderate to severe stenosis. The left PCA branches then are within normal limits. There is AA severe right PCA P2 segment stenosis beyond which distal right PCA branches are poorly enhancing. This is stable to the MRA finding yesterday. Anterior circulation: Both ICA siphons are patent although there is moderate to severe siphon soft and calcified plaque. There is severe stenosis at the right anterior genu related the calcified plaque. There is moderate to severe stenosis in the left cavernous segment and in the proximal left supraclinoid segment related to soft and calcified plaque. Both carotid termini remain patent. Both MCA and ACA origins are normal. Anterior communicating artery and bilateral ACA branches are within normal limits. Left MCA M1 segment, bifurcation, and left MCA branches are within normal limits. Right MCA M1 segment, trifurcation, and right proximal right MCA branches are within normal limits. There is some distal right M2 irregularity and stenosis in the posterior division (series 706, image 20). Venous sinuses: Patent. Anatomic variants: The left vertebral artery arises directly from the arch. Dominant right vertebral artery. IMPRESSION: 1. Negative for intracranial emergent large vessel occlusion. However, the left vertebral artery is severely atherosclerotic throughout its course and is intermittently occluded in the left V4 segment. And there is moderate to severe irregularity and stenosis of the proximal basilar artery which appears somewhat more pronounced than on the MRA yesterday. 2. Additionally there is moderate to severe intracranial atherosclerosis of the bilateral ICA siphons and right PCA P2 segment. 3. Abundant soft plaque in both cervical carotid arteries, with some areas of low-density soft plaque noted. Stenosis up to 55% occurs. 4. Mild to moderate stenosis of the origin of the dominant right vertebral artery. Electronically  Signed: By: Genevie Ann M.D. On: 10/16/2015 10:12   Ct Angio Neck W Or Wo Contrast  Addendum Date: 10/16/2015   ADDENDUM REPORT: 10/16/2015 10:20 ADDENDUM: Study discussed by telephone with Dr. Roland Rack on 10/16/2015 At 1014 hours. We also discussed the bulky soft plaque or mural thrombus in the distal arch, which is not strongly suspected to be a factor in the intracranial ischemia given noted is distal to the left subclavian origin. Electronically Signed   By: Genevie Ann M.D.   On: 10/16/2015 10:20   Result Date: 10/16/2015 CLINICAL DATA:  82 year old male with right side weakness aphasia and trouble swallowing. Initial encounter. Left paracentral brainstem infarct on recent MRI. EXAM: CT ANGIOGRAPHY HEAD AND NECK TECHNIQUE: Multidetector CT imaging of the head and neck was performed using the standard protocol during bolus administration of intravenous contrast. Multiplanar CT image reconstructions and MIPs were obtained to evaluate the vascular anatomy. Carotid stenosis measurements (when applicable) are obtained utilizing NASCET criteria, using the distal internal carotid diameter as the denominator. CONTRAST:  50 mL Isovue 370 COMPARISON:  Head CT without contrast 912 hours today. Brain MRI and intracranial MRA 10/14/2015. FINDINGS: CTA NECK Skeleton: No acute osseous abnormality identified. Fairly advanced cervical spine degeneration. Visualized paranasal sinuses and mastoids are stable and well pneumatized. Other neck: Mild upper lung atelectasis. No superior mediastinal lymphadenopathy. Negative thyroid, larynx, pharynx, parapharyngeal spaces, retropharyngeal space, sublingual space, submandibular glands and parotid glands. No cervical lymphadenopathy. Aortic arch: 4 vessel arch configuration, the left vertebral artery arises directly from the arch. Bulky soft plaque or mural thrombus in the distal arch, primarily distal to the left subclavian origin. Superimposed more ordinary soft and calcified  arch atherosclerosis. Right carotid system: Circumferential soft plaque in the right CCA, but  no hemodynamically significant stenosis proximal to the right carotid bifurcation. At the bifurcation right ICA origins stenosis up to 55% occurs. Cervical right ICA otherwise is negative. Left carotid system: No left CCA origin stenosis despite soft and calcified plaque. Circumferential soft plaque in the left CCA, including some low-density plaque in the medial vessel at the level of the a hyoid (series 701, image 65). Soft and calcified plaque at the left carotid bifurcation without stenosis. Circumferential soft plaque been affects the left ICA distal to the bulb, but no hemodynamically significant stenosis occurs. Vertebral arteries:Right subclavian artery origin fifty % stenosis with respect to the distal vessel related to soft plaque. Calcified plaque at the right vertebral artery origin with mild to moderate stenosis. Calcified plaque in the right V1 segment without stenosis. No other right vertebral artery stenosis to the skullbase. The left vertebral artery arises directly from the arch, but its origin and proximal segment are highly stenotic. The V1 segment is diminutive and highly irregular. The left V2 segment is slightly better enhancing. There are tandem moderate to severe stenoses in the left V3 segment, and where the vessel crosses the dura. See intracranial findings below. CTA HEAD Posterior circulation: The dominant distal right vertebral artery demonstrates calcified plaque at the skullbase not resulting in significant stenosis. The right PICA origin is patent. The right vertebral artery supplies the basilar. The distal left vertebral artery is occluded from the C1 level to the left PICA origin. The vessel is poorly reconstituted from the left PICA to the vertebrobasilar junction, with another short segmental occlusion. There is moderate to severe irregularity and stenosis of the proximal basilar artery  between the vertebrobasilar junction and right IAC origin as seen on series 703, image 115. This appears more pronounced than on the intracranial MRA yesterday. Basilar remains patent. Posterior communicating arteries are diminutive or absent. SCA and PCA origins are patent although there is bilateral PCA origin moderate to severe stenosis. The left PCA branches then are within normal limits. There is AA severe right PCA P2 segment stenosis beyond which distal right PCA branches are poorly enhancing. This is stable to the MRA finding yesterday. Anterior circulation: Both ICA siphons are patent although there is moderate to severe siphon soft and calcified plaque. There is severe stenosis at the right anterior genu related the calcified plaque. There is moderate to severe stenosis in the left cavernous segment and in the proximal left supraclinoid segment related to soft and calcified plaque. Both carotid termini remain patent. Both MCA and ACA origins are normal. Anterior communicating artery and bilateral ACA branches are within normal limits. Left MCA M1 segment, bifurcation, and left MCA branches are within normal limits. Right MCA M1 segment, trifurcation, and right proximal right MCA branches are within normal limits. There is some distal right M2 irregularity and stenosis in the posterior division (series 706, image 20). Venous sinuses: Patent. Anatomic variants: The left vertebral artery arises directly from the arch. Dominant right vertebral artery. IMPRESSION: 1. Negative for intracranial emergent large vessel occlusion. However, the left vertebral artery is severely atherosclerotic throughout its course and is intermittently occluded in the left V4 segment. And there is moderate to severe irregularity and stenosis of the proximal basilar artery which appears somewhat more pronounced than on the MRA yesterday. 2. Additionally there is moderate to severe intracranial atherosclerosis of the bilateral ICA  siphons and right PCA P2 segment. 3. Abundant soft plaque in both cervical carotid arteries, with some areas of low-density soft plaque noted.  Stenosis up to 55% occurs. 4. Mild to moderate stenosis of the origin of the dominant right vertebral artery. Electronically Signed: By: Genevie Ann M.D. On: 10/16/2015 10:12   Mr Brain Wo Contrast  Result Date: 10/14/2015 CLINICAL DATA:  Expressive aphasia. EXAM: MRI HEAD WITHOUT CONTRAST TECHNIQUE: Multiplanar, multiecho pulse sequences of the brain and surrounding structures were obtained without intravenous contrast. COMPARISON:  None. FINDINGS: Acute infarct left pons, moderate in size.  No other acute infarct Mild chronic microvascular ischemic changes in the white matter. Moderate atrophy. Negative for mass or edema.  No shift of the midline structures Pituitary normal in size. Normal orbital contents. Bilateral cataract extraction. Mucosal edema right maxillary sinus.  Remaining sinuses clear. IMPRESSION: Acute infarct in the left pons. Generalized atrophy with mild chronic microvascular ischemia in the white matter. Electronically Signed   By: Franchot Gallo M.D.   On: 10/14/2015 15:02   Dg Esophagus  Result Date: 10/21/2015 CLINICAL DATA:  Dysphagia. EXAM: ESOPHOGRAM/BARIUM SWALLOW TECHNIQUE: Single contrast examination was performed using  thin barium. FLUOROSCOPY TIME:  Fluoroscopy Time:  0 minutes 42 seconds COMPARISON:  None. FINDINGS: Mucosa and motility of the esophagus are normal. No appreciable hiatal hernia, stricture, or mass. There was no aspiration with thin barium. IMPRESSION: Normal barium esophagram. Electronically Signed   By: Lorriane Shire M.D.   On: 10/21/2015 09:53   Mr Brain Ltd W/o Cm  Result Date: 10/17/2015 CLINICAL DATA:  Worsening aphasia and numbness with tingling of the LEFT side of the face. EXAM: MRI HEAD WITHOUT CONTRAST TECHNIQUE: Diffusion and ADC sequences of the brain and surrounding structures were obtained without  intravenous contrast. COMPARISON:  MRI brain 10/14/2015.  CTA head neck 10/16/2015. FINDINGS: Only diffusion-weighted imaging was requested. There is moderate worsening of the LEFT paramedian pontine area of restricted diffusion representing acute infarction, as compared with 10/14/2015. There is slight increase in dorsal extension towards the floor of the fourth ventricle, there is more lateral extension towards the brachium pontis, and there is more craniocaudal extension towards the midbrain and inferior pons. No contralateral extension. No areas of cerebellar or cerebral hemispheric acute infarction are observed. IMPRESSION: Moderate worsening of acute LEFT paramedian pontine infarction as compared with 10/14/2015. Electronically Signed   By: Staci Righter M.D.   On: 10/17/2015 15:47   Mr Jodene Nam Head/brain [id number] Cm  Result Date: 10/14/2015 CLINICAL DATA:  Expressive aphasia EXAM: MRA HEAD WITHOUT CONTRAST TECHNIQUE: Angiographic images of the Circle of Willis were obtained using MRA technique without intravenous contrast. COMPARISON:  Same day brain MRI FINDINGS: Intracranial internal carotid arteries: There is moderate to severe narrowing of the intracranial left internal carotid artery, worst at the cavernous and clinoid segments. There is multifocal moderate atherosclerotic narrowing of the intracranial right ICA. Anterior cerebral arteries: Normal. Middle cerebral arteries: Possible 1.5 cm aneurysm versus atherosclerotic irregularity at the left P-comm origin. Otherwise normal. Posterior communicating arteries: Absent bilaterally Posterior cerebral arteries: Normal. Basilar artery: There is moderate narrowing of the midportion of the basilar artery. Vertebral arteries: Right-dominant. Minimal flow related enhancement seen within the proximal V4 segment of the left vertebral artery. Superior cerebellar arteries: Normal. Anterior inferior cerebellar arteries: Normal. Posterior inferior cerebellar arteries:  Normal. IMPRESSION: 1. Moderate narrowing of the midportion of the basilar artery, likely secondary to atherosclerotic disease. 2. Multifocal moderate to severe narrowing of the intracranial internal carotid arteries, left worse than right. 3. Possible 1.5 mm aneurysm versus atherosclerotic irregularity at the left posterior communicating artery origin. Electronically Signed  By: Ulyses Jarred M.D.   On: 10/14/2015 20:28   Ct Head Code Stroke Wo Contrast  Addendum Date: 10/16/2015   ADDENDUM REPORT: 10/16/2015 09:30 ADDENDUM: Study discussed by telephone with Dr. Roland Rack on 10/16/2015 At 0926 hours. Electronically Signed   By: Genevie Ann M.D.   On: 10/16/2015 09:30   Result Date: 10/16/2015 CLINICAL DATA:  Code stroke. 82 year old male with right side weakness aphasia and trouble swallowing. Initial encounter. Left paracentral brainstem infarct on recent MRI. EXAM: CT HEAD WITHOUT CONTRAST TECHNIQUE: Contiguous axial images were obtained from the base of the skull through the vertex without intravenous contrast. COMPARISON:  Brain MRI and intracranial MRA 10/14/2015 FINDINGS: Visualized paranasal sinuses and mastoids are stable and well pneumatized. No acute osseous abnormality identified. No acute orbit or scalp soft tissue findings. Extensive Calcified atherosclerosis at the skull base. Mild hypodensity corresponding to the left paracentral pontine infarct seen by MRI. No associated hemorrhage or mass effect. No superimposed acute cortically based infarct identified. No acute intracranial hemorrhage identified. ASPECTS [facility] Stroke Program Early CT Score) Total score (0-10 with 10 being normal): 10. Stable cerebral volume. No ventriculomegaly. Basilar cisterns remain patent. IMPRESSION: 1. Expected CT appearance of the left paracentral brainstem infarct seen by MRI 2 days ago. No associated hemorrhage or mass effect. 2. No superimposed acute cortically based infarct or new intracranial  abnormality. 3. ASPECTS is 10. Electronically Signed: By: Genevie Ann M.D. On: 10/16/2015 09:23    Transthoracic echocardiogram performed on 10/15/2015 Impression: - Left ventricle: The cavity size was normal. Systolic function was normal. The estimated ejection fraction was in the range of 60% to 65%. Wall motion was normal; there were no regional wall motion abnormalities. Doppler parameters are consistent with abnormal left ventricular relaxation (grade 1 diastolic dysfunction). Doppler parameters are consistent with indeterminate ventricular filling pressure.   Subjective:   Discharge Exam: Vitals:   10/21/15 1000 10/21/15 1100  BP: (!) 189/65 (!) 189/64  Pulse: 64 61  Resp: 19 15  Temp:     Vitals:   10/21/15 0814 10/21/15 0900 10/21/15 1000 10/21/15 1100  BP: (!) 189/70 (!) 173/73 (!) 189/65 (!) 189/64  Pulse: 71 78 64 61  Resp: 19 (!) 23 19 15   Temp: 98.9 F (37.2 C)     TempSrc: Oral     SpO2: 99%     Weight:      Height:        General: Pt is alert, awake, not in acute distress Cardiovascular: RRR, S1/S2 +, no rubs, no gallops, + bilateral carotid bruits Respiratory: CTA bilaterally, no wheezing, no rhonchi Abdominal: Soft, NT, ND, bowel sounds hyperactive Extremities: no edema, no cyanosis  The results of significant diagnostics from this hospitalization (including imaging, microbiology, ancillary and laboratory) are listed below for reference.    Microbiology: Recent Results (from the past 240 hour(s))  MRSA PCR Screening     Status: None   Collection Time: 10/20/15 11:59 AM  Result Value Ref Range Status   MRSA by PCR NEGATIVE NEGATIVE Final    Comment:        The GeneXpert MRSA Assay (FDA approved for NASAL specimens only), is one component of a comprehensive MRSA colonization surveillance program. It is not intended to diagnose MRSA infection nor to guide or monitor treatment for MRSA infections.     Labs: Basic Metabolic  Panel:  Recent Labs Lab 10/14/15 1255 10/14/15 1307 10/17/15 0551 10/21/15 0834  NA 138 140 135 136  K 4.5  4.6 3.9 4.4  CL 104 102 105 108  CO2 27  --  22 20*  GLUCOSE 127* 124* 133* 124*  BUN 25* 27* 21* 17  CREATININE 1.45* 1.50* 1.49* 1.39*  CALCIUM 9.8  --  9.3 8.9   Liver Function Tests:  Recent Labs Lab 10/14/15 1255  AST 71*  ALT 103*  ALKPHOS 53  BILITOT 0.7  PROT 8.4*  ALBUMIN 4.8   CBC:  Recent Labs Lab 10/14/15 1255 10/14/15 1307 10/17/15 0551  WBC 8.6  --  8.4  NEUTROABS 5.8  --   --   HGB 17.1* 17.7* 16.6  HCT 50.3 52.0 49.2  MCV 89.0  --  89.6  PLT 187  --  178   CBG:  Recent Labs Lab 10/20/15 1133 10/20/15 1659 10/20/15 2358 10/21/15 0425 10/21/15 0812  GLUCAP 169* 145* 98 103* 124*   Thyroid function studies  Recent Labs  10/21/15 0834  TSH 1.852   Urinalysis    Component Value Date/Time   COLORURINE YELLOW 10/14/2015 1250   APPEARANCEUR CLEAR 10/14/2015 1250   LABSPEC 1.010 10/14/2015 1250   PHURINE 6.5 10/14/2015 1250   GLUCOSEU NEGATIVE 10/14/2015 1250   HGBUR NEGATIVE 10/14/2015 1250   BILIRUBINUR NEGATIVE 10/14/2015 1250   KETONESUR NEGATIVE 10/14/2015 1250   PROTEINUR NEGATIVE 10/14/2015 1250   NITRITE NEGATIVE 10/14/2015 1250   LEUKOCYTESUR NEGATIVE 10/14/2015 1250   Time coordinating discharge: Over 30 minutes  SIGNED: Vance Gather, MD  [facility] 10/21/2015, 11:26 AM Pager   If 7PM-7AM, please contact night-coverage [email or web address] Password [email or web address]

## 2015-10-21 NOTE — H&P (Signed)
Physical Medicine and Rehabilitation Admission H&P    Chief Complaint  Patient presents with  . trouble with speech  . Numbness  . Fatigue  : HPI: Samuel Pughis a 82 y.o.malewith history of Gout, CADMaintained on aspirin, SVT, CKD with creatinine 1.50, T2DM PVD with RLE claudicationwho was admitted on 10/14/15 with difficulty talking, difficulty moving RLE and difficulty with RUE coordination. Per chart review patient lives alone independently using a walker prior to admission. Patient had stopped taking his ASA 8/13 in anticipation of upcoming dental procedure. UDS negative MRI brain/CT angiogram of head and neck done revealing acute left pontine infarct, generalized atrophy, moderate narrowing midportion of basilar artery and multifocal moderate to severe narrowing of intracranial internal carotid arteries L >R.Carotid dopplers without significant stenosis. Cardiac echo with EF 02-63%, normal systolic function and mild MVR/AVR. [name] recommended increasing ASA 325 mg with plavix X 3 months followed by plavix alone due to BA stenosis. BP goal 130 -150 due to BA stenosis. Subcutaneous heparin for DVT prophylaxis.On the afternoon of 10/20/2015 after arrangements being made to be admitted to CIR patient while sitting up in chair became unresponsive with heart rate in the 40s. Patient had received metoprolol at morning. EKG showed prolongation of the P-P interval with resultant significant pause and heart rate 56. Rapid response contacted. He was transferred to ICU and cardiology consulted. It was felt asystole likely vagally mediated and no acute indication for pacemaker and cardiology signed off as patient had no additional episodes. Right hand and wrist swollen tender suspect flareup of gout as well as right knee with colchicine and prednisone initiated 10/21/2015.  Tolerating a mechanical soft diet. Physical and occupational therapy evaluations completed with recommendations of physical  medicine rehabilitation consult. Patient was admitted for comprehensive rehabilitation program  ROS ROS Constitutional: Negative for fever.  Eyes: Positive for blurred vision.  Respiratory: Negative for cough.  Cardiovascular: Negative for chest pain.  Gastrointestinal: Negative.  Genitourinary: Negative.  Musculoskeletal: Positive for joint pain. Negative for falls, myalgiasand neck pain.  Skin: Negative for rash.  Neurological: Positive for dizzinessand focal weakness, slurred speech. Negative for headaches.  Endo/Heme/Allergies: Negative.  Psychiatric/Behavioral: Negative.  All other systems reviewed and are negative  Past Medical History:  Diagnosis Date  . Adenomatous polyp   . CAD (coronary artery disease)   . Chronic kidney disease   . Diverticulosis   . DVT (deep venous thrombosis) ([place])   . Gout   . History of pulmonary embolus (PE)    from a DVT in 2008  . Hyperglycemia   . Hyperlipidemia   . Hypertension   . Mallory-Weiss tear   . Peripheral vascular disease ([place])   . SVT (supraventricular tachycardia) ([place])   . Vitamin B12 deficiency    Past Surgical History:  Procedure Laterality Date  .  PTCA of coronary lesion Right 1995  . APPENDECTOMY    . PR VEIN BYPASS GRAFT,AORTO-FEM-POP Left    femoral-popliteal BP by Dr. Drucie Opitz  . repair of aal fissure   1978   Family History  Problem Relation Age of Onset  . Cancer Mother     bladder  . CAD Father   . Heart attack Father    Social History:  reports that he has never smoked. He has never used smokeless tobacco. He reports that he does not drink alcohol or use drugs. Allergies:  Allergies  Allergen Reactions  . Lipitor [Atorvastatin]     Muscle pain   . Zetia [Ezetimibe]  Hives   Medications Prior to Admission  Medication Sig Dispense Refill  . amoxicillin (AMOXIL) 500 MG capsule Take 500 mg by mouth every 6 (six) hours.     Marland Kitchen aspirin 81 MG tablet Take 81 mg by mouth daily.    Marland Kitchen losartan  (COZAAR) 50 MG tablet Take 50 mg by mouth daily.    . metoprolol tartrate (LOPRESSOR) 25 MG tablet Take 25 mg by mouth 2 (two) times daily.      Home: Home Living Family/patient expects to be discharged to:: Private residence Living Arrangements: Alone Available Help at Discharge: Family, Available 24 hours/day Type of Home: House Home Access: Stairs to enter Technical brewer of Steps: 2+1 Entrance Stairs-Rails: None Home Layout: One level Bathroom Shower/Tub: Tub/shower unit, Multimedia programmer: Standard Home Equipment: Bedside commode, Environmental consultant - 2 wheels, Cane - single point Additional Comments: Unable to obtain as pt was very emotional and crying when asked about home  Lives With: Alone   Functional History: Prior Function Level of Independence: Independent  Functional Status:  Mobility: Bed Mobility Overal bed mobility: Needs Assistance Bed Mobility: Supine to Sit, Sit to Supine Supine to sit: Min assist Sit to supine: Min assist General bed mobility comments: min asssit to come to sitting for LEs and elevation of trunk Transfers Overall transfer level: Needs assistance Equipment used: 1 person hand held assist Transfers: Sit to/from Stand Sit to Stand: Min assist, +2 safety/equipment General transfer comment: min assist to boost to stand to stabilize balance upon standing.  Pt needed handheld assist on left hand.  Right hand with gout and very sore.  Pt was able to stand for 2 min at EOB shifting weight and picking up one LE at a time.  Pt demonstrated incr weight on right hemibody.  Needed cues for midline as head even flexed to right.  Ambulation/Gait Ambulation/Gait assistance: Min assist, Min guard Ambulation Distance (Feet): 130 Feet Assistive device: Rolling walker (2 wheeled) Gait Pattern/deviations: Step-through pattern General Gait Details: mildly unsteady worsening with fatigue, but better with RW.  Worked on heel/toe gait Gait velocity  interpretation: Below normal speed for age/gender    ADL: ADL Overall ADL's : Needs assistance/impaired Grooming: Wash/dry hands, Minimal assistance, Standing Grooming Details (indicate cue type and reason): incr time required Toilet Transfer: Minimal assistance, Ambulation, RW, BSC Toilet Transfer Details (indicate cue type and reason): cues for positioning and to make sure patient does not pull down pants prior to proper alignment with toilet.  Toileting - Clothing Manipulation Details (indicate cue type and reason): pt reports retraction of penis and inability to properly void bladder. RN Marc Morgans made aware. pt with minimal void at this time Functional mobility during ADLs: Minimal assistance, Rolling walker General ADL Comments: pt requires cues to initiate sit<>stand and to correctly sequence RW.   Cognition: Cognition Overall Cognitive Status: Within Functional Limits for tasks assessed Arousal/Alertness: Awake/alert Orientation Level: Oriented to person, Oriented to place, Disoriented to situation, Disoriented to time Attention: Sustained Sustained Attention: Appears intact Memory: Appears intact Awareness: Appears intact Problem Solving: Appears intact Cognition Arousal/Alertness: Awake/alert Behavior During Therapy: Restless Overall Cognitive Status: Within Functional Limits for tasks assessed Difficult to assess due to:  (Pt emotional and crying throughout session)  Physical Exam: Blood pressure (!) 189/70, pulse 71, temperature 98.9 F (37.2 C), temperature source Oral, resp. rate 19, height 5' 7"  (1.702 m), weight 80.4 kg (177 lb 3.2 oz), SpO2 99 %. Physical Exam Constitutional: He appears well-developed. Well nourished.  NAD. HENT:  Head: Normocephalic. Atraumatic. Eyes: EOMI. Pupils are equal, round, and reactive to light.  Neck: Normal range of motion. Supple Cardiovascular: Normal rate and rhythm.  Respiratory: Effort normal. Breath sounds normal. +[place] GI:  Soft. Nontender. BS WNL. Musculoskeletal: Edema noted right hand and wrist with erythema and TTP.   Neurological: Alert and oriented x3. Apraxia Right facial weakness. Speech slurred with motor apraxia.  Has word finding deficits and delays with processing.  Motor: RUE 4-/5 proximal to wrist (wrist/hand not tested due to pain) Decreased FMC.  RLE: 4/5 proximal to distal Sensation intact to light touch LUE/LLE: 5/5 proximal to distal Psych: Slowed. Blunt affect.    Psychiatric: He has a normal mood and affect. His behavior is normal Results for orders placed or performed during the hospital encounter of 10/14/15 (from the past 48 hour(s))  Glucose, capillary     Status: Abnormal   Collection Time: 10/19/15 11:57 AM  Result Value Ref Range   Glucose-Capillary 185 (H) 65 - 99 mg/dL  Glucose, capillary     Status: Abnormal   Collection Time: 10/19/15  8:02 PM  Result Value Ref Range   Glucose-Capillary 187 (H) 65 - 99 mg/dL  Glucose, capillary     Status: Abnormal   Collection Time: 10/20/15 12:11 AM  Result Value Ref Range   Glucose-Capillary 129 (H) 65 - 99 mg/dL  Glucose, capillary     Status: Abnormal   Collection Time: 10/20/15  5:25 AM  Result Value Ref Range   Glucose-Capillary 130 (H) 65 - 99 mg/dL  Glucose, capillary     Status: Abnormal   Collection Time: 10/20/15  7:45 AM  Result Value Ref Range   Glucose-Capillary 134 (H) 65 - 99 mg/dL  Glucose, capillary     Status: Abnormal   Collection Time: 10/20/15 11:33 AM  Result Value Ref Range   Glucose-Capillary 169 (H) 65 - 99 mg/dL  MRSA PCR Screening     Status: None   Collection Time: 10/20/15 11:59 AM  Result Value Ref Range   MRSA by PCR NEGATIVE NEGATIVE    Comment:        The GeneXpert MRSA Assay (FDA approved for NASAL specimens only), is one component of a comprehensive MRSA colonization surveillance program. It is not intended to diagnose MRSA infection nor to guide or monitor treatment for MRSA  infections.   Glucose, capillary     Status: Abnormal   Collection Time: 10/20/15  4:59 PM  Result Value Ref Range   Glucose-Capillary 145 (H) 65 - 99 mg/dL  Glucose, capillary     Status: None   Collection Time: 10/20/15 11:58 PM  Result Value Ref Range   Glucose-Capillary 98 65 - 99 mg/dL   Comment 1 Notify RN   Glucose, capillary     Status: Abnormal   Collection Time: 10/21/15  4:25 AM  Result Value Ref Range   Glucose-Capillary 103 (H) 65 - 99 mg/dL   Comment 1 Notify RN   Glucose, capillary     Status: Abnormal   Collection Time: 10/21/15  8:12 AM  Result Value Ref Range   Glucose-Capillary 124 (H) 65 - 99 mg/dL  Basic metabolic panel     Status: Abnormal   Collection Time: 10/21/15  8:34 AM  Result Value Ref Range   Sodium 136 135 - 145 mmol/L   Potassium 4.4 3.5 - 5.1 mmol/L   Chloride 108 101 - 111 mmol/L   CO2 20 (L) 22 - 32 mmol/L   Glucose, Bld  124 (H) 65 - 99 mg/dL   BUN 17 6 - 20 mg/dL   Creatinine, Ser 1.39 (H) 0.61 - 1.24 mg/dL   Calcium 8.9 8.9 - 10.3 mg/dL   GFR calc non Af Amer 44 (L) >60 mL/min   GFR calc Af Amer 51 (L) >60 mL/min    Comment: (NOTE) The eGFR has been calculated using the CKD EPI equation. This calculation has not been validated in all clinical situations. eGFR's persistently <60 mL/min signify possible Chronic Kidney Disease.    Anion gap 8 5 - 15  TSH     Status: None   Collection Time: 10/21/15  8:34 AM  Result Value Ref Range   TSH 1.852 0.350 - 4.500 uIU/mL   Dg Esophagus  Result Date: 10/21/2015 CLINICAL DATA:  Dysphagia. EXAM: ESOPHOGRAM/BARIUM SWALLOW TECHNIQUE: Single contrast examination was performed using  thin barium. FLUOROSCOPY TIME:  Fluoroscopy Time:  0 minutes 42 seconds COMPARISON:  None. FINDINGS: Mucosa and motility of the esophagus are normal. No appreciable hiatal hernia, stricture, or mass. There was no aspiration with thin barium. IMPRESSION: Normal barium esophagram. Electronically Signed   By: Lorriane Shire  M.D.   On: 10/21/2015 09:53       Medical Problem List and Plan:  1.  Right hemiparesis dysarthria/dysphagia secondary to left pontine infarct 2.  DVT Prophylaxis/Anticoagulation: Subcutaneous heparin. Monitor platelet count and any signs of bleeding 3. Pain Management: Tylenol as needed 4. Hypertension. Lopressor discontinued due to bradycardia.Continue hydralazine 10 mg every 6 hours,  Monitor with increased mobility 5. Sinus arrest due to vagal spell. Beta blocker has been discontinued for now. No additional episodes. Follow-up per cardiology services. No plan at this time for pacemaker. 6. Neuropsych: This patient is not fully capable of making decisions on his own behalf. 7. Skin/Wound Care: Routine skin check 8. Fluids/Electrolytes/Nutrition: Routine I&O's with follow-up chemistries 9. Chronic renal insufficiency. Baseline creatinine 1.5. Follow-up chemistries 10. Diet-controlled diabetes mellitus. Hemoglobin A1c 7.3. Check blood sugars before meals and at bedtime. Diabetic teaching. 11. CAD/SVT. No chest pain or shortness of breath.  12. Gout. Placed on colchicine 1 dose as well as prednisone 40 mg daily. 13. Hyperlipidemia. Pravachol   Post Admission Physician Evaluation: 1. Functional deficits secondary  to left pontine infarct. 2. Patient is admitted to receive collaborative, interdisciplinary care between the physiatrist, rehab nursing staff, and therapy team. 3. Patient's level of medical complexity and substantial therapy needs in context of that medical necessity cannot be provided at a lesser intensity of care such as a SNF. 4. Patient has experienced substantial functional loss from his/her baseline which was documented above under the "Functional History" and "Functional Status" headings.  Judging by the patient's diagnosis, physical exam, and functional history, the patient has potential for functional progress which will result in measurable gains while on inpatient  rehab.  These gains will be of substantial and practical use upon discharge  in facilitating mobility and self-care at the household level. 5. Physiatrist will provide 24 hour management of medical needs as well as oversight of the therapy plan/treatment and provide guidance as appropriate regarding the interaction of the two. 6. 24 hour rehab nursing will assist with bladder management, bowel management, safety, skin/wound care, disease management, medication administration, pain management and patient education and help integrate therapy concepts, techniques,education, etc. 7. PT will assess and treat for/with: Lower extremity strength, range of motion, stamina, balance, functional mobility, safety, adaptive techniques and equipment, woundcare, coping skills, pain control, stroke education.  Goals are: Supervision. 8. OT will assess and treat for/with: ADL's, functional mobility, safety, upper extremity strength, adaptive techniques and equipment, wound mgt, ego support, and community reintegration.   Goals are: Supervision. Therapy may proceed with showering this patient. 9. SLP will assess and treat for/with: speech, language, swallowing, higher level cognition.  Goals are: Mod I. 10. Case Management and Social Worker will assess and treat for psychological issues and discharge planning. 11. Team conference will be held weekly to assess progress toward goals and to determine barriers to discharge. 12. Patient will receive at least 3 hours of therapy per day at least 5 days per week. 13. ELOS: 10-15 days.       14. Prognosis:  good   Delice Lesch, MD 10/21/2015

## 2015-10-21 NOTE — Progress Notes (Signed)
Physical Therapy Treatment Patient Details Name: [name] MRN: [id number] DOB: Jan 21, 1928 Today's Date: 10/21/2015    History of Present Illness 82 y.o. male admitted for slurred speech and RLE weakness. MRI (+) for acute L pons infarct. PMH significant for HTN, HLD, CAD, SVT, PVD, hx of DVT and resulting PE (2008), gout, and CKD.     PT Comments    Pt admitted with above diagnosis. Pt currently with functional limitations due to balance and endurance deficits. Pt was able to sit EOB and stand EOB. Limited by gout pain.  Will continue to progress as pt tolerates.  Going for MBS at end of session. Pt will benefit from skilled PT to increase their independence and safety with mobility to allow discharge to the venue listed below.    Follow Up Recommendations  CIR     Equipment Recommendations  None recommended by PT    Recommendations for Other Services Rehab consult     Precautions / Restrictions Precautions Precautions: Fall Restrictions Weight Bearing Restrictions: No    Mobility  Bed Mobility Overal bed mobility: Needs Assistance Bed Mobility: Supine to Sit;Sit to Supine     Supine to sit: Min assist Sit to supine: Min assist   General bed mobility comments: min asssit to come to sitting for LEs and elevation of trunk  Transfers Overall transfer level: Needs assistance Equipment used: 1 person hand held assist Transfers: Sit to/from Stand Sit to Stand: Min assist;+2 safety/equipment         General transfer comment: min assist to boost to stand to stabilize balance upon standing.  Pt needed handheld assist on left hand.  Right hand with gout and very sore.  Pt was able to stand for 2 min at EOB shifting weight and picking up one LE at a time.  Pt demonstrated incr weight on right hemibody.  Needed cues for midline as head even flexed to right.   Ambulation/Gait                 Stairs            Wheelchair Mobility    Modified Rankin (Stroke  Patients Only) Modified Rankin (Stroke Patients Only) Pre-Morbid Rankin Score: No symptoms Modified Rankin: Moderately severe disability     Balance Overall balance assessment: Needs assistance Sitting-balance support: Feet supported;Single extremity supported Sitting balance-Leahy Scale: Fair     Standing balance support: Single extremity supported;During functional activity Standing balance-Leahy Scale: Fair Standing balance comment: Pt was able to stand with single UE support on one side and guard on other side with min to mod assist for static stance.  Pt limited by right UE and LE pain due to gout.                     Cognition Arousal/Alertness: Awake/alert Behavior During Therapy: Restless Overall Cognitive Status: Within Functional Limits for tasks assessed                      Exercises General Exercises - Lower Extremity Long Arc Quad: AROM;Both;5 reps;Seated Heel Slides: AROM;Both;5 reps;Supine    General Comments General comments (skin integrity, edema, etc.): right wrist and hand swollen - gout?      Pertinent Vitals/Pain Pain Assessment: No/denies pain Pain Score: 8  Pain Location: right hand and right knee Pain Intervention(s): Limited activity within patient's tolerance;Monitored during session;Premedicated before session;Repositioned  BP elevated 199/72 supine.  214/68 in sitting.  Order for permissive hypertension.  Nurse aware.  BP once pt laid back down 173/73.  99% on 3LO2.      Home Living     Available Help at Discharge: Family;Available 24 hours/day   Home Access: Stairs to enter Entrance Stairs-Rails: None Home Layout: One level Home Equipment: Bedside commode;Walker - 2 wheels;Cane - single point      Prior Function            PT Goals (current goals can now be found in the care plan section) Progress towards PT goals: Progressing toward goals    Frequency  Min 3X/week    PT Plan Current plan remains appropriate     Co-evaluation             End of Session Equipment Utilized During Treatment: Gait belt;Oxygen Activity Tolerance: Patient tolerated treatment well;Patient limited by fatigue Patient left: in bed;with call bell/phone within reach;Other (comment);with family/visitor present (Xray tech in room to take to [facility])     Time: [phone] PT Time Calculation (min) (ACUTE ONLY): 28 min  Charges:  $Therapeutic Exercise: 8-22 mins $Therapeutic Activity: 8-22 mins                    G CodesIrwin Brakeman F Nov 10, 2015, 9:30 AM [name] [phone] (pager)

## 2015-10-21 NOTE — H&P (View-Only) (Signed)
Physical Medicine and Rehabilitation Admission H&P    Chief Complaint  Patient presents with  . trouble with speech  . Numbness  . Fatigue  : HPI: Samuel Pughis a 83 y.o.malewith history of Gout, CADMaintained on aspirin, SVT, CKD with creatinine 1.50, T2DM PVD with RLE claudicationwho was admitted on 10/14/15 with difficulty talking, difficulty moving RLE and difficulty with RUE coordination. Per chart review patient lives alone independently using a walker prior to admission. Patient had stopped taking his ASA 8/13 in anticipation of upcoming dental procedure. UDS negative MRI brain/CT angiogram of head and neck done revealing acute left pontine infarct, generalized atrophy, moderate narrowing midportion of basilar artery and multifocal moderate to severe narrowing of intracranial internal carotid arteries L >R.Carotid dopplers without significant stenosis. Cardiac echo with EF 63-84%, normal systolic function and mild MVR/AVR. [name] recommended increasing ASA 325 mg with plavix X 3 months followed by plavix alone due to BA stenosis. BP goal 130 -150 due to BA stenosis. Subcutaneous heparin for DVT prophylaxis.On the afternoon of 10/20/2015 after arrangements being made to be admitted to CIR patient while sitting up in chair became unresponsive with heart rate in the 40s. Patient had received metoprolol at morning. EKG showed prolongation of the P-P interval with resultant significant pause and heart rate 56. Rapid response contacted. He was transferred to ICU and cardiology consulted. It was felt asystole likely vagally mediated and no acute indication for pacemaker and cardiology signed off as patient had no additional episodes. Right hand and wrist swollen tender suspect flareup of gout as well as right knee with colchicine and prednisone initiated 10/21/2015.  Tolerating a mechanical soft diet. Physical and occupational therapy evaluations completed with recommendations of physical  medicine rehabilitation consult. Patient was admitted for comprehensive rehabilitation program  ROS ROS Constitutional: Negative for fever.  Eyes: Positive for blurred vision.  Respiratory: Negative for cough.  Cardiovascular: Negative for chest pain.  Gastrointestinal: Negative.  Genitourinary: Negative.  Musculoskeletal: Positive for joint pain. Negative for falls, myalgiasand neck pain.  Skin: Negative for rash.  Neurological: Positive for dizzinessand focal weakness, slurred speech. Negative for headaches.  Endo/Heme/Allergies: Negative.  Psychiatric/Behavioral: Negative.  All other systems reviewed and are negative  Past Medical History:  Diagnosis Date  . Adenomatous polyp   . CAD (coronary artery disease)   . Chronic kidney disease   . Diverticulosis   . DVT (deep venous thrombosis) ([place])   . Gout   . History of pulmonary embolus (PE)    from a DVT in 2008  . Hyperglycemia   . Hyperlipidemia   . Hypertension   . Mallory-Weiss tear   . Peripheral vascular disease ([place])   . SVT (supraventricular tachycardia) ([place])   . Vitamin B12 deficiency    Past Surgical History:  Procedure Laterality Date  .  PTCA of coronary lesion Right 1995  . APPENDECTOMY    . PR VEIN BYPASS GRAFT,AORTO-FEM-POP Left    femoral-popliteal BP by Dr. Drucie Opitz  . repair of aal fissure   1978   Family History  Problem Relation Age of Onset  . Cancer Mother     bladder  . CAD Father   . Heart attack Father    Social History:  reports that he has never smoked. He has never used smokeless tobacco. He reports that he does not drink alcohol or use drugs. Allergies:  Allergies  Allergen Reactions  . Lipitor [Atorvastatin]     Muscle pain   . Zetia [Ezetimibe]  Hives   Medications Prior to Admission  Medication Sig Dispense Refill  . amoxicillin (AMOXIL) 500 MG capsule Take 500 mg by mouth every 6 (six) hours.     Marland Kitchen aspirin 81 MG tablet Take 81 mg by mouth daily.    Marland Kitchen losartan  (COZAAR) 50 MG tablet Take 50 mg by mouth daily.    . metoprolol tartrate (LOPRESSOR) 25 MG tablet Take 25 mg by mouth 2 (two) times daily.      Home: Home Living Family/patient expects to be discharged to:: Private residence Living Arrangements: Alone Available Help at Discharge: Family, Available 24 hours/day Type of Home: House Home Access: Stairs to enter Technical brewer of Steps: 2+1 Entrance Stairs-Rails: None Home Layout: One level Bathroom Shower/Tub: Tub/shower unit, Multimedia programmer: Standard Home Equipment: Bedside commode, Environmental consultant - 2 wheels, Cane - single point Additional Comments: Unable to obtain as pt was very emotional and crying when asked about home  Lives With: Alone   Functional History: Prior Function Level of Independence: Independent  Functional Status:  Mobility: Bed Mobility Overal bed mobility: Needs Assistance Bed Mobility: Supine to Sit, Sit to Supine Supine to sit: Min assist Sit to supine: Min assist General bed mobility comments: min asssit to come to sitting for LEs and elevation of trunk Transfers Overall transfer level: Needs assistance Equipment used: 1 person hand held assist Transfers: Sit to/from Stand Sit to Stand: Min assist, +2 safety/equipment General transfer comment: min assist to boost to stand to stabilize balance upon standing.  Pt needed handheld assist on left hand.  Right hand with gout and very sore.  Pt was able to stand for 2 min at EOB shifting weight and picking up one LE at a time.  Pt demonstrated incr weight on right hemibody.  Needed cues for midline as head even flexed to right.  Ambulation/Gait Ambulation/Gait assistance: Min assist, Min guard Ambulation Distance (Feet): 130 Feet Assistive device: Rolling walker (2 wheeled) Gait Pattern/deviations: Step-through pattern General Gait Details: mildly unsteady worsening with fatigue, but better with RW.  Worked on heel/toe gait Gait velocity  interpretation: Below normal speed for age/gender    ADL: ADL Overall ADL's : Needs assistance/impaired Grooming: Wash/dry hands, Minimal assistance, Standing Grooming Details (indicate cue type and reason): incr time required Toilet Transfer: Minimal assistance, Ambulation, RW, BSC Toilet Transfer Details (indicate cue type and reason): cues for positioning and to make sure patient does not pull down pants prior to proper alignment with toilet.  Toileting - Clothing Manipulation Details (indicate cue type and reason): pt reports retraction of penis and inability to properly void bladder. RN Marc Morgans made aware. pt with minimal void at this time Functional mobility during ADLs: Minimal assistance, Rolling walker General ADL Comments: pt requires cues to initiate sit<>stand and to correctly sequence RW.   Cognition: Cognition Overall Cognitive Status: Within Functional Limits for tasks assessed Arousal/Alertness: Awake/alert Orientation Level: Oriented to person, Oriented to place, Disoriented to situation, Disoriented to time Attention: Sustained Sustained Attention: Appears intact Memory: Appears intact Awareness: Appears intact Problem Solving: Appears intact Cognition Arousal/Alertness: Awake/alert Behavior During Therapy: Restless Overall Cognitive Status: Within Functional Limits for tasks assessed Difficult to assess due to:  (Pt emotional and crying throughout session)  Physical Exam: Blood pressure (!) 189/70, pulse 71, temperature 98.9 F (37.2 C), temperature source Oral, resp. rate 19, height 5' 7"  (1.702 m), weight 80.4 kg (177 lb 3.2 oz), SpO2 99 %. Physical Exam Constitutional: He appears well-developed. Well nourished.  NAD. HENT:  Head: Normocephalic. Atraumatic. Eyes: EOMI. Pupils are equal, round, and reactive to light.  Neck: Normal range of motion. Supple Cardiovascular: Normal rate and rhythm.  Respiratory: Effort normal. Breath sounds normal. +[place] GI:  Soft. Nontender. BS WNL. Musculoskeletal: Edema noted right hand and wrist with erythema and TTP.   Neurological: Alert and oriented x3. Apraxia Right facial weakness. Speech slurred with motor apraxia.  Has word finding deficits and delays with processing.  Motor: RUE 4-/5 proximal to wrist (wrist/hand not tested due to pain) Decreased FMC.  RLE: 4/5 proximal to distal Sensation intact to light touch LUE/LLE: 5/5 proximal to distal Psych: Slowed. Blunt affect.    Psychiatric: He has a normal mood and affect. His behavior is normal Results for orders placed or performed during the hospital encounter of 10/14/15 (from the past 48 hour(s))  Glucose, capillary     Status: Abnormal   Collection Time: 10/19/15 11:57 AM  Result Value Ref Range   Glucose-Capillary 185 (H) 65 - 99 mg/dL  Glucose, capillary     Status: Abnormal   Collection Time: 10/19/15  8:02 PM  Result Value Ref Range   Glucose-Capillary 187 (H) 65 - 99 mg/dL  Glucose, capillary     Status: Abnormal   Collection Time: 10/20/15 12:11 AM  Result Value Ref Range   Glucose-Capillary 129 (H) 65 - 99 mg/dL  Glucose, capillary     Status: Abnormal   Collection Time: 10/20/15  5:25 AM  Result Value Ref Range   Glucose-Capillary 130 (H) 65 - 99 mg/dL  Glucose, capillary     Status: Abnormal   Collection Time: 10/20/15  7:45 AM  Result Value Ref Range   Glucose-Capillary 134 (H) 65 - 99 mg/dL  Glucose, capillary     Status: Abnormal   Collection Time: 10/20/15 11:33 AM  Result Value Ref Range   Glucose-Capillary 169 (H) 65 - 99 mg/dL  MRSA PCR Screening     Status: None   Collection Time: 10/20/15 11:59 AM  Result Value Ref Range   MRSA by PCR NEGATIVE NEGATIVE    Comment:        The GeneXpert MRSA Assay (FDA approved for NASAL specimens only), is one component of a comprehensive MRSA colonization surveillance program. It is not intended to diagnose MRSA infection nor to guide or monitor treatment for MRSA  infections.   Glucose, capillary     Status: Abnormal   Collection Time: 10/20/15  4:59 PM  Result Value Ref Range   Glucose-Capillary 145 (H) 65 - 99 mg/dL  Glucose, capillary     Status: None   Collection Time: 10/20/15 11:58 PM  Result Value Ref Range   Glucose-Capillary 98 65 - 99 mg/dL   Comment 1 Notify RN   Glucose, capillary     Status: Abnormal   Collection Time: 10/21/15  4:25 AM  Result Value Ref Range   Glucose-Capillary 103 (H) 65 - 99 mg/dL   Comment 1 Notify RN   Glucose, capillary     Status: Abnormal   Collection Time: 10/21/15  8:12 AM  Result Value Ref Range   Glucose-Capillary 124 (H) 65 - 99 mg/dL  Basic metabolic panel     Status: Abnormal   Collection Time: 10/21/15  8:34 AM  Result Value Ref Range   Sodium 136 135 - 145 mmol/L   Potassium 4.4 3.5 - 5.1 mmol/L   Chloride 108 101 - 111 mmol/L   CO2 20 (L) 22 - 32 mmol/L   Glucose, Bld  124 (H) 65 - 99 mg/dL   BUN 17 6 - 20 mg/dL   Creatinine, Ser 1.39 (H) 0.61 - 1.24 mg/dL   Calcium 8.9 8.9 - 10.3 mg/dL   GFR calc non Af Amer 44 (L) >60 mL/min   GFR calc Af Amer 51 (L) >60 mL/min    Comment: (NOTE) The eGFR has been calculated using the CKD EPI equation. This calculation has not been validated in all clinical situations. eGFR's persistently <60 mL/min signify possible Chronic Kidney Disease.    Anion gap 8 5 - 15  TSH     Status: None   Collection Time: 10/21/15  8:34 AM  Result Value Ref Range   TSH 1.852 0.350 - 4.500 uIU/mL   Dg Esophagus  Result Date: 10/21/2015 CLINICAL DATA:  Dysphagia. EXAM: ESOPHOGRAM/BARIUM SWALLOW TECHNIQUE: Single contrast examination was performed using  thin barium. FLUOROSCOPY TIME:  Fluoroscopy Time:  0 minutes 42 seconds COMPARISON:  None. FINDINGS: Mucosa and motility of the esophagus are normal. No appreciable hiatal hernia, stricture, or mass. There was no aspiration with thin barium. IMPRESSION: Normal barium esophagram. Electronically Signed   By: Lorriane Shire  M.D.   On: 10/21/2015 09:53       Medical Problem List and Plan:  1.  Right hemiparesis dysarthria/dysphagia secondary to left pontine infarct 2.  DVT Prophylaxis/Anticoagulation: Subcutaneous heparin. Monitor platelet count and any signs of bleeding 3. Pain Management: Tylenol as needed 4. Hypertension. Lopressor discontinued due to bradycardia.Continue hydralazine 10 mg every 6 hours,  Monitor with increased mobility 5. Sinus arrest due to vagal spell. Beta blocker has been discontinued for now. No additional episodes. Follow-up per cardiology services. No plan at this time for pacemaker. 6. Neuropsych: This patient is not fully capable of making decisions on his own behalf. 7. Skin/Wound Care: Routine skin check 8. Fluids/Electrolytes/Nutrition: Routine I&O's with follow-up chemistries 9. Chronic renal insufficiency. Baseline creatinine 1.5. Follow-up chemistries 10. Diet-controlled diabetes mellitus. Hemoglobin A1c 7.3. Check blood sugars before meals and at bedtime. Diabetic teaching. 11. CAD/SVT. No chest pain or shortness of breath.  12. Gout. Placed on colchicine 1 dose as well as prednisone 40 mg daily. 13. Hyperlipidemia. Pravachol   Post Admission Physician Evaluation: 1. Functional deficits secondary  to left pontine infarct. 2. Patient is admitted to receive collaborative, interdisciplinary care between the physiatrist, rehab nursing staff, and therapy team. 3. Patient's level of medical complexity and substantial therapy needs in context of that medical necessity cannot be provided at a lesser intensity of care such as a SNF. 4. Patient has experienced substantial functional loss from his/her baseline which was documented above under the "Functional History" and "Functional Status" headings.  Judging by the patient's diagnosis, physical exam, and functional history, the patient has potential for functional progress which will result in measurable gains while on inpatient  rehab.  These gains will be of substantial and practical use upon discharge  in facilitating mobility and self-care at the household level. 5. Physiatrist will provide 24 hour management of medical needs as well as oversight of the therapy plan/treatment and provide guidance as appropriate regarding the interaction of the two. 6. 24 hour rehab nursing will assist with bladder management, bowel management, safety, skin/wound care, disease management, medication administration, pain management and patient education and help integrate therapy concepts, techniques,education, etc. 7. PT will assess and treat for/with: Lower extremity strength, range of motion, stamina, balance, functional mobility, safety, adaptive techniques and equipment, woundcare, coping skills, pain control, stroke education.  Goals are: Supervision. 8. OT will assess and treat for/with: ADL's, functional mobility, safety, upper extremity strength, adaptive techniques and equipment, wound mgt, ego support, and community reintegration.   Goals are: Supervision. Therapy may proceed with showering this patient. 9. SLP will assess and treat for/with: speech, language, swallowing, higher level cognition.  Goals are: Mod I. 10. Case Management and Social Worker will assess and treat for psychological issues and discharge planning. 11. Team conference will be held weekly to assess progress toward goals and to determine barriers to discharge. 12. Patient will receive at least 3 hours of therapy per day at least 5 days per week. 13. ELOS: 10-15 days.       14. Prognosis:  good   Delice Lesch, MD 10/21/2015

## 2015-10-21 NOTE — Progress Notes (Addendum)
Pt discharged to Rehab  per bed to [address] with all belongings - accompanied by nurse tech and wife.

## 2015-10-21 NOTE — Progress Notes (Signed)
Retta Diones, RN Rehab Admission Coordinator Signed Physical Medicine and Rehabilitation  PMR Pre-admission Date of Service: 10/19/2015 12:14 PM  Related encounter: ED to Hosp-Admission (Current) from 10/14/2015 in [place]       '[]'$ Hide copied text PMR Admission Coordinator Pre-Admission Assessment  Patient: [name] is an 82 y.o., male MRN: [id number] DOB: [birth year] Height: '5\' 7"'$  (170.2 cm) Weight: 80.4 kg (177 lb 3.2 oz)                                                                                                                                                                                                                                                                          Insurance Information HMO: Yes    PPO:       PCP:       IPA:       80/20:       OTHER:  Group # [id number] PRIMARY: U[employer] Medicare      Policy#: [id number]      Subscriber:  Watt Climes CM Name: Sharlett Iles      Phone#: [phone]     Fax#: [phone] Pre-Cert#: [id number] - See below     Employer: Retired Benefits:  Phone #: [phone]     Name: Purcell Nails. Date:  03/01/15     Deduct:  $0     Out of Pocket Max: $4900 (met $0)      Life Max: unlimited CIR: $345 days 1-5      SNF: $0 days 1-20; $160 days 21-51; $0 days 52-100 Outpatient: medical necessity     Co-Pay: $40 copay Home Health: 100%      Co-Pay: none DME: 80%     Co-Pay: 20% Providers: in network benefits only - Note we are in a denial mode at this time with an expedited appeal in process.  We should know something from the appeal in the next 3 days.  Medicaid Application Date:        Case Manager:   Disability Application Date:        Case Worker:    Emergency Contact Information        Contact Information    Name Relation Home Work  Mobile   Allayne Gitelman Daughter [phone]  [phone]   Lofing,Wanda Daughter [phone]       Current Medical History  Patient Admitting Diagnosis: left  pontine infarct with right hemiparesis dysarthria/dysphagia/processing delays   History of Present Illness: An 82 y.o.malewith history of Gout,CADmaintained on aspirin, SVT, CKDwith creatinine 1.50, T2DM PVD with RLE claudicationwho was admitted on 10/14/15 with difficulty talking, difficulty moving RLE and difficulty with RUE coordination.Per chart review patient lives alone independently using a walker prior to admission.Patient had stopped taking his ASA 8/13 in anticipation of upcoming dental procedure. UDS negative MRI brain/CT angiogram of head and neckdone revealing acute left pontine infarct, generalized atrophy, moderate narrowing midportion of basilar artery and multifocal moderate to severe narrowing of intracranial internal carotid arteries L >R.Carotid dopplers without significant stenosis. Cardiac echo with EF 22-63%, normal systolic function and mild MVR/AVR. [name] recommended increasing ASA 325 mg with plavix X 3 months followed by plavix alone due to BA stenosis. BP goal 130 -150 due to BA stenosis.Subcutaneous heparin for DVT prophylaxis.On the afternoon of 10/20/2015 after arrangements being made to be admitted to CIR patient while sitting up in chair became unresponsive with heart rate in the 40s. Patient had received metoprolol at morning. EKG showed prolongation of the P-P interval with resultant significant pauseand heart rate 56. Rapid response contacted. He was transferred to ICU and cardiology consulted. It was felt asystole likely vagally mediated and no acute indication for pacemaker and cardiology signed off as patient had no additional episodes. Right hand and wrist swollen tender suspect flareup of gout as well as right knee with colchicine and prednisone initiated 10/21/2015. Tolerating a mechanical soft diet. Physical and occupational therapy evaluations completed with recommendations of physical medicine rehabilitation consult. Patient to be admitted for  comprehensive inpatient rehabilitation program.    Total: 3=NIH  Past Medical History      Past Medical History:  Diagnosis Date  . Adenomatous polyp   . CAD (coronary artery disease)   . Chronic kidney disease   . Diverticulosis   . DVT (deep venous thrombosis) ([place])   . Gout   . History of pulmonary embolus (PE)    from a DVT in 2008  . Hyperglycemia   . Hyperlipidemia   . Hypertension   . Mallory-Weiss tear   . Peripheral vascular disease ([place])   . SVT (supraventricular tachycardia) ([place])   . Vitamin B12 deficiency     Family History  family history includes CAD in his father; Cancer in his mother; Heart attack in his father.  Prior Rehab/Hospitalizations: No previous rehab.  Has the patient had major surgery during 100 days prior to admission? No  Current Medications   Current Facility-Administered Medications:  .  0.9 %  sodium chloride infusion, , Intravenous, Continuous, Kelvin Cellar, MD, Last Rate: 75 mL/hr at 10/21/15 0743 .  acetaminophen (TYLENOL) tablet 650 mg, 650 mg, Oral, Q6H PRN, Kelvin Cellar, MD, 650 mg at 10/15/15 1851 .  amoxicillin (AMOXIL) 250 MG/5ML suspension 500 mg, 500 mg, Oral, Q8H, Kelvin Cellar, MD, Stopped at 10/20/15 2200 .  antiseptic oral rinse (CPC / CETYLPYRIDINIUM CHLORIDE 0.05%) solution 7 mL, 7 mL, Mouth Rinse, BID, Patrecia Pour, MD .  aspirin suppository 300 mg, 300 mg, Rectal, Daily, 300 mg at 10/14/15 2305 **OR** aspirin tablet 325 mg, 325 mg, Oral, Daily, Lavina Hamman, MD, 325 mg at 10/21/15 1158 .  bisacodyl (DULCOLAX) suppository 10 mg, 10 mg, Rectal, Daily PRN, Kelvin Cellar, MD .  clopidogrel (PLAVIX) tablet 75 mg, 75 mg, Oral, Daily, Kelvin Cellar, MD, 75 mg at 10/21/15 1158 .  [COMPLETED] colchicine tablet 1.2 mg, 1.2 mg, Oral, Once, 1.2 mg at 10/21/15 1158 **FOLLOWED BY** colchicine tablet 0.6 mg, 0.6 mg, Oral, Once, Patrecia Pour, MD .  docusate sodium (COLACE) capsule 100 mg, 100 mg, Oral,  BID, Kelvin Cellar, MD, 100 mg at 10/21/15 1159 .  heparin injection 5,000 Units, 5,000 Units, Subcutaneous, Q8H, Lavina Hamman, MD, 5,000 Units at 10/21/15 8250 .  hydrALAZINE (APRESOLINE) injection 5 mg, 5 mg, Intravenous, Q6H PRN, Lavina Hamman, MD, 5 mg at 10/16/15 0508 .  hydrALAZINE (APRESOLINE) tablet 10 mg, 10 mg, Oral, Q6H, Patrecia Pour, MD, 10 mg at 10/21/15 1158 .  insulin aspart (novoLOG) injection 0-9 Units, 0-9 Units, Subcutaneous, Q4H, Lavina Hamman, MD, 1 Units at 10/20/15 2024 .  polyethylene glycol (MIRALAX / GLYCOLAX) packet 17 g, 17 g, Oral, Daily, Kelvin Cellar, MD, 17 g at 10/21/15 1200 .  pravastatin (PRAVACHOL) tablet 20 mg, 20 mg, Oral, q1800, Rosalin Hawking, MD, 20 mg at 10/19/15 1709 .  predniSONE (DELTASONE) tablet 40 mg, 40 mg, Oral, Q breakfast, Patrecia Pour, MD, 40 mg at 10/21/15 1159 .  senna-docusate (Senokot-S) tablet 1 tablet, 1 tablet, Oral, QHS PRN, Lavina Hamman, MD  Patients Current Diet: DIET DYS 3 Room service appropriate? Yes; Fluid consistency: Thin  Precautions / Restrictions Precautions Precautions: Fall Restrictions Weight Bearing Restrictions: No   Has the patient had 2 or more falls or a fall with injury in the past year?No  Prior Activity Level Community (5-7x/wk): Went out daily, was driving.  Home Assistive Devices / Equipment Home Assistive Devices/Equipment: None Home Equipment: Bedside commode, Environmental consultant - 2 wheels, Cane - single point  Prior Device Use: Indicate devices/aids used by the patient prior to current illness, exacerbation or injury? None.  Daughter reports that no device was used at home.  Prior Functional Level Prior Function Level of Independence: Independent  Self Care: Did the patient need help bathing, dressing, using the toilet or eating?  Independent  Indoor Mobility: Did the patient need assistance with walking from room to room (with or without device)? Independent  Stairs: Did the patient need  assistance with internal or external stairs (with or without device)? Independent  Functional Cognition: Did the patient need help planning regular tasks such as shopping or remembering to take medications? Independent  Current Functional Level Cognition Arousal/Alertness: Awake/alert Overall Cognitive Status: Within Functional Limits for tasks assessed Difficult to assess due to:  (Pt emotional and crying throughout session) Orientation Level: Oriented to person, Oriented to place, Disoriented to situation, Disoriented to time Attention: Sustained Sustained Attention: Appears intact Memory: Appears intact Awareness: Appears intact Problem Solving: Appears intact    Extremity Assessment (includes Sensation/Coordination) Upper Extremity Assessment: Defer to OT evaluation  Lower Extremity Assessment: RLE deficits/detail, LLE deficits/detail RLE Deficits / Details: proximal weaknesses 4/5, generally coordinated. LLE Deficits / Details: WFL, mild proximal weakness   ADLs Overall ADL's : Needs assistance/impaired Grooming: Wash/dry hands, Minimal assistance, Standing Grooming Details (indicate cue type and reason): incr time required Toilet Transfer: Minimal assistance, Ambulation, RW, BSC Toilet Transfer Details (indicate cue type and reason): cues for positioning and to make sure patient does not pull down pants prior to proper alignment with toilet.  Toileting - Clothing Manipulation Details (indicate cue type and reason): pt reports retraction of penis and inability to properly void bladder. RN Marc Morgans made aware. pt with  minimal void at this time Functional mobility during ADLs: Minimal assistance, Rolling walker General ADL Comments: pt requires cues to initiate sit<>stand and to correctly sequence RW.    Mobility Overal bed mobility: Needs Assistance Bed Mobility: Supine to Sit, Sit to Supine Supine to sit: Min assist Sit to supine: Min assist General bed mobility comments: min  asssit to come to sitting for LEs and elevation of trunk   Transfers Overall transfer level: Needs assistance Equipment used: 1 person hand held assist Transfers: Sit to/from Stand Sit to Stand: Min assist, +2 safety/equipment General transfer comment: min assist to boost to stand to stabilize balance upon standing.  Pt needed handheld assist on left hand.  Right hand with gout and very sore.  Pt was able to stand for 2 min at EOB shifting weight and picking up one LE at a time.  Pt demonstrated incr weight on right hemibody.  Needed cues for midline as head even flexed to right.    Ambulation / Gait / Stairs / Wheelchair Mobility Ambulation/Gait Ambulation/Gait assistance: Min assist, Min guard Ambulation Distance (Feet): 130 Feet Assistive device: Rolling walker (2 wheeled) Gait Pattern/deviations: Step-through pattern General Gait Details: mildly unsteady worsening with fatigue, but better with RW.  Worked on heel/toe gait Gait velocity interpretation: Below normal speed for age/gender   Posture / Balance Balance Overall balance assessment: Needs assistance Sitting-balance support: Feet supported, Single extremity supported Sitting balance-Leahy Scale: Fair Standing balance support: Single extremity supported, During functional activity Standing balance-Leahy Scale: Fair Standing balance comment: Pt was able to stand with single UE support on one side and guard on other side with min to mod assist for static stance.  Pt limited by right UE and LE pain due to gout.    Special needs/care consideration BiPAP/CPAP No CPM No Continuous Drip IV No Dialysis No         Life Vest No Oxygen No Special Bed No Trach Size No Wound Vac (area) No     Skin No                            Bowel mgmt: Last BM 10/19/15.  Reports problems of constipation. Bladder mgmt: Voiding up in bathroom with assistance Diabetic mgmt Yes, on oral medications at home.   Previous Home Environment Living  Arrangements: Alone  Lives With: Alone Available Help at Discharge: Family, Available 24 hours/day Type of Home: House Home Layout: One level Home Access: Stairs to enter Entrance Stairs-Rails: None Entrance Stairs-Number of Steps: 2+1 Bathroom Shower/Tub: Public librarian, Multimedia programmer: Standard Home Care Services: No Additional Comments: Unable to obtain as pt was very emotional and crying when asked about home  Discharge Living Setting Plans for Discharge Living Setting: House, Lives with (comment) (Plans home with daughter and son-in-law.)  Also has a granddaughter and a great granddaughter in daughters home. Type of Home at Discharge: House Discharge Home Layout: Two level, Laundry or work area in basement, Able to live on main level with bedroom/bathroom Alternate Level Stairs-Number of Steps: Flight Discharge Home Access: Stairs to enter Technical brewer of Steps: 3 Does the patient have any problems obtaining your medications?: No  Social/Family/Support Systems Patient Roles: Parent (Has 2 daughters, grand daughter, great grand daughter.) Contact Information: [name] Wimbs - daughter Anticipated Caregiver: daughter Anticipated Caregiver's Contact Information: [name] - daughter - [phone] Ability/Limitations of Caregiver: Daughter can work from home and provide supervision Caregiver Availability: 24/7  Discharge Plan Discussed with Primary Caregiver: Yes Is Caregiver In Agreement with Plan?: Yes Does Caregiver/Family have Issues with Lodging/Transportation while Pt is in Rehab?: No  Goals/Additional Needs Patient/Family Goal for Rehab: PT/OT mod I, SLP mod I and supervision goals Expected length of stay: 7-10 days Cultural Considerations: [employer] Dietary Needs: Dys 3, thin liquids Equipment Needs: TBD Pt/Family Agrees to Admission and willing to participate: Yes Program Orientation Provided & Reviewed with Pt/Caregiver Including  Roles  & Responsibilities: Yes  Decrease burden of Care through IP rehab admission: N/A  Possible need for SNF placement upon discharge: Not anticipated  Patient Condition: This patient's medical and functional status has changed since the consult dated: 10/16/15 in which the Rehabilitation Physician determined and documented that the patient's condition is appropriate for intensive rehabilitative care in an inpatient rehabilitation facility. See "History of Present Illness" (above) for medical update. Functional changes are: Currently requiring min assist for transfers and min to min guard assist 130 feet RW. Patient's medical and functional status update has been discussed with the Rehabilitation physician and patient remains appropriate for inpatient rehabilitation. Will admit to inpatient rehab today.   Preadmission Screen Completed By:  Retta Diones, 10/21/2015 1:49 PM ______________________________________________________________________   Discussed status with Dr. Posey Pronto on 10/21/15 at 1348 and received telephone approval for admission today.  Admission Coordinator:  Retta Diones, time1348/Date08/23/17       Cosigned by: Ankit Lorie Phenix, MD at 10/21/2015 1:52 PM  Revision History

## 2015-10-21 NOTE — Progress Notes (Signed)
Patient and family arrived from 2C07 with NT and patient belongings. Patient and family oriented to fall prevention plan, fall safety plan, rehab schedule, rehab safety plan, and health resource notebook with verbal understanding. Patient resting comfortably in bed with no complaints of pain and family at bedside.

## 2015-10-21 NOTE — Progress Notes (Signed)
Called report to [facility] on [place]. Family & pt aware he will be going to rehab. Will transport pt per bed.

## 2015-10-22 ENCOUNTER — Inpatient Hospital Stay (HOSPITAL_COMMUNITY): Payer: Medicare Other

## 2015-10-22 ENCOUNTER — Inpatient Hospital Stay (HOSPITAL_COMMUNITY): Payer: Medicare Other | Admitting: Occupational Therapy

## 2015-10-22 ENCOUNTER — Inpatient Hospital Stay (HOSPITAL_COMMUNITY): Payer: Medicare Other | Admitting: Speech Pathology

## 2015-10-22 DIAGNOSIS — I6350 Cerebral infarction due to unspecified occlusion or stenosis of unspecified cerebral artery: Secondary | ICD-10-CM

## 2015-10-22 DIAGNOSIS — G8191 Hemiplegia, unspecified affecting right dominant side: Secondary | ICD-10-CM

## 2015-10-22 LAB — GLUCOSE, CAPILLARY
GLUCOSE-CAPILLARY: 161 mg/dL — AB (ref 65–99)
GLUCOSE-CAPILLARY: 174 mg/dL — AB (ref 65–99)
GLUCOSE-CAPILLARY: 282 mg/dL — AB (ref 65–99)
GLUCOSE-CAPILLARY: 190 mg/dL — AB (ref 65–99)

## 2015-10-22 MED ORDER — FAMOTIDINE 20 MG PO TABS
20.0000 mg | ORAL_TABLET | Freq: Two times a day (BID) | ORAL | Status: DC
  Administered 2015-10-22 – 2015-11-11 (×41): 20 mg via ORAL
  Filled 2015-10-22 (×41): qty 1

## 2015-10-22 NOTE — Evaluation (Signed)
Physical Therapy Assessment and Plan  Patient Details  Name: [name] MRN: [id number] Date of Birth: Feb 21, 1928  PT Diagnosis: Abnormal posture, Abnormality of gait, Cognitive deficits, Hemiparesis dominant and Pain in joint Rehab Potential: Good ELOS: 14 days   Today's Date: 10/22/2015 PT Individual Time: 8315-1761 PT Individual Time Calculation (min): 80 min     Problem List:  Patient Active Problem List   Diagnosis Date Noted  . Left pontine CVA ([place]) 10/21/2015  . Dysarthria, post-stroke   . Right hemiparesis ([place])   . Sinus arrest   . CKD (chronic kidney disease)   . DM type 2 without retinopathy ([place])   . Coronary artery disease involving native coronary artery of native heart without angina pectoris   . Acute idiopathic gout of right hand   . Aphasia   . Dysphagia, post-stroke   . HLD (hyperlipidemia)   . Essential hypertension   . Orthostatic hypotension   . 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[place]) 10/14/2015  . CVA (cerebral infarction) 10/14/2015  . Accelerated hypertension 10/14/2015  . Type 2 diabetes mellitus ([place]) 10/14/2015  . Peripheral vascular disease, unspecified ([place]) 06/04/2013    Past Medical History:  Past Medical History:  Diagnosis Date  . Adenomatous polyp   . CAD (coronary artery disease)   . Chronic kidney disease   . Diverticulosis   . DVT (deep venous thrombosis) ([place])   . Gout   . History of pulmonary embolus (PE)    from a DVT in 2008  . Hyperglycemia   . Hyperlipidemia   . Hypertension   . Mallory-Weiss tear   . Peripheral vascular disease ([place])   . SVT (supraventricular tachycardia) ([place])   . Vitamin B12 deficiency    Past Surgical History:  Past Surgical History:  Procedure Laterality Date  .  PTCA of coronary lesion Right 1995  . APPENDECTOMY    . PR VEIN BYPASS GRAFT,AORTO-FEM-POP Left    femoral-popliteal BP by Dr. Drucie Opitz  . repair of aal fissure   1978    Assessment &  Plan Clinical Impression: [name] a 82 y.o.malewith history of Gout,CADMaintained on aspirin, SVT, CKDwith creatinine 1.50, T2DM PVD with RLE claudicationwho was admitted on 10/14/15 with difficulty talking, difficulty moving RLE and difficulty with RUE coordination.Per chart review patient lives alone independently using a walker prior to admission.Patient had stopped taking his ASA 8/13 in anticipation of upcoming dental procedure. UDS negative MRI brain/CT angiogram of head and neckdone revealing acute left pontine infarct, generalized atrophy, moderate narrowing midportion of basilar artery and multifocal moderate to severe narrowing of intracranial internal carotid arteries L >R.Carotid dopplers without significant stenosis. Cardiac echo with EF 60-73%, normal systolic function and mild MVR/AVR. [name] recommended increasing ASA 325 mg with plavix X 3 months followed by plavix alone due to BA stenosis. BP goal 130 -150 due to BA stenosis.Subcutaneous heparin for DVT prophylaxis.On the afternoon of 10/20/2015 after arrangements being made to be admitted to CIR patient while sitting up in chair became unresponsive with heart rate in the 40s. Patient had received metoprolol at morning. EKG showed prolongation of the P-P interval with resultant significant pauseand heart rate 56. Rapid response contacted. He was transferred to ICU and cardiology consulted. It was felt asystole likely vagally mediated and no acute indication for pacemaker and cardiology signed off as patient had no additional episodes. Right hand and wrist swollen tender suspect flareup of gout as well as right knee with colchicine and prednisone  initiated 10/21/2015. Tolerating a mechanical soft diet. Patient transferred to CIR on 10/21/2015 .   Patient currently requires mod with mobility secondary to muscle joint tightness and gout pain and swelling R elbow, wrist, hand, decreased cardiorespiratoy endurance and impaired  timing and sequencing, unbalanced muscle activation, decreased coordination and decreased motor planning.  Prior to hospitalization, patient was independent  with mobility and lived with Alone (dtr plans for pt to go home to her house) in a House (dtr's 3 story) home.  Home access is dtr's 3STE carport into house on middle levelStairs to enter.  Patient will benefit from skilled PT intervention to maximize safe functional mobility, minimize fall risk and decrease caregiver burden for planned discharge home with 24 hour supervision.  Anticipate patient will benefit from follow up [place] at discharge. PT - End of Session Activity Tolerance: Tolerates 10 - 20 min activity with multiple rests Endurance Deficit: Yes (Simultaneous filing. User may not have seen previous data.) Endurance Deficit Description: DOE stairs PT Assessment Rehab Potential (ACUTE/IP ONLY): Good Barriers to Discharge: Decreased caregiver support PT Patient demonstrates impairments in the following area(s): Balance;Edema;Endurance;Motor;Pain;Safety PT Transfers Functional Problem(s): Bed Mobility;Bed to Chair;Car;Furniture PT Locomotion Functional Problem(s): Ambulation;Wheelchair Mobility;Stairs PT Plan PT Intensity: Minimum of 1-2 x/day ,45 to 90 minutes PT Frequency: 5 out of 7 days PT Duration Estimated Length of Stay: 14 days PT Treatment/Interventions: Ambulation/gait training;Balance/vestibular training;Cognitive remediation/compensation;Discharge planning;Community reintegration;DME/adaptive equipment instruction;Functional mobility training;Patient/family education;Pain management;Neuromuscular re-education;Psychosocial support;Splinting/orthotics;Therapeutic Exercise;Therapeutic Activities;Stair training;UE/LE Strength taining/ROM;UE/LE Coordination activities;Wheelchair propulsion/positioning PT Transfers Anticipated Outcome(s): supervision basic and car PT Locomotion Anticipated Outcome(s): supervision gait x 150' in all  environements, and up/down 12 steps 2 rails; supervision w/c x 150' in controlled environment and community environment; supervsion w/c x 50' in home env PT Recommendation Follow Up Recommendations: Home health PT Patient destination: Home (dtr's home) Equipment Recommended: To be determined Skilled Therapeutic Intervention Pt has poor insight into deficits; he stated he intended to d/c to his home alone, although his dtr told him he will d/c to her house.  neuromuscular re-education via forced use, manual cues for supine bil bridging and bil lower trunk rotation x 15 each. Switched pt to narrower , hemi ht w/c for improved fit and function. Pt left resting in w/c with quick release belt applied and all needs within reach.    PT Evaluation Precautions/Restrictions Precautions Precautions: Fall Precaution Comments: right hemiparesis with increased pain in the left wrist and hand secondary to gout Restrictions Weight Bearing Restrictions: No General   Vital Signs Pain Pain Assessment Pain Assessment: Faces Pain Score: 10-Worst pain ever Faces Pain Scale: Hurts little more Pain Type: Acute pain Pain Location: Hand (gout) Pain Orientation: Right Pain Descriptors / Indicators: Discomfort Pain Intervention(s): Medication (See eMAR) Home Living/Prior Functioning Home Living Available Help at Discharge: Family Type of Home: House (dtr's 3 story) Home Access: Stairs to enter Technical brewer of Steps: dtr's 3STE carport into house on middle level Entrance Stairs-Rails: None (dtr's) Home Layout: Multi-level (dtr's) Bathroom Shower/Tub: Chiropodist: Standard Bathroom Accessibility: Yes Additional Comments: at dtr's, full bath and BR avaliable on entry level, per pt  Lives With: Alone (dtr plans for pt to go home to her house) Prior Function Level of Independence: Independent with basic ADLs;Independent with homemaking with ambulation;Independent with  transfers;Independent with gait  Able to Take Stairs?: Yes Driving: Yes Vocation: Retired Physicist, medical, retired) Leisure: Hobbies-yes (Comment) Comments: listens to music Vision/Perception  Vision - Assessment Ocular Range of Motion: Within Functional Limits Tracking/Visual  Pursuits: Decreased smoothness of vertical tracking;Decreased smoothness of horizontal tracking;Requires cues, head turns, or add eye shifts to track;Other (comment) (Pt with lost visual fixation with tracking in all directions.  ) Saccades: Additional head turns occurred during testing Convergence: Impaired (comment) (No convergence noted with testing)  Cognition Overall Cognitive Status: Impaired/Different from baseline Arousal/Alertness: Awake/alert Orientation Level: Oriented X4 Attention: Sustained Sustained Attention: Appears intact Memory: Impaired Awareness: Impaired Awareness Impairment: Intellectual impairment;Emergent impairment Problem Solving: Impaired Problem Solving Impairment: Verbal basic;Functional basic Safety/Judgment: Impaired Comments: Pt with decreased awareness of safety.  Pt unable to state that it would not be safe to get up without assistance secondary to balance after therapist completed transfer to the wheelchair from bed.   Sensation Sensation Light Touch: Appears Intact Stereognosis: Not tested Proprioception: Appears Intact Coordination Gross Motor Movements are Fluid and Coordinated: No Fine Motor Movements are Fluid and Coordinated: No Coordination and Movement Description: Pt demonstrates Brunnstrum stage III-IV movement in the RUE with functional tasks.  Needs mod assist to integrate into bathing but attempts to use it frequently without the need for cueing as this was his dominant hand.  Heel Shin Test: reduced excursion and speed LLE Motor  Motor Motor: Hemiplegia;Abnormal postural alignment and control Motor - Skilled Clinical Observations: Right hemiparesis with the RUE  being more impaired than the LE  Mobility Bed Mobility Bed Mobility: Supine to Sit Rolling Right: 5: Supervision Rolling Left: 5: Supervision Rolling Left Details: Verbal cues for technique Right Sidelying to Sit: 5: Supervision Right Sidelying to Sit Details: Verbal cues for precautions/safety;Verbal cues for technique Supine to Sit: 3: Mod assist;HOB flat Supine to Sit Details: Verbal cues for sequencing;Verbal cues for technique Sit to Supine: 4: Min guard Transfers Transfers: Yes Sit to Stand: Without upper extremity assist;With armrests;From chair/3-in-1;4: Min assist Stand to Sit: 4: Min assist;With armrests;With upper extremity assist Stand Pivot Transfers: 3: Mod assist Stand Pivot Transfer Details: Manual facilitation for weight shifting;Verbal cues for sequencing;Verbal cues for technique Locomotion  Ambulation Ambulation: Yes Ambulation/Gait Assistance: 3: Mod assist Assistive device: None Ambulation/Gait Assistance Details: Manual facilitation for weight bearing;Manual facilitation for weight shifting;Verbal cues for technique Gait Gait: Yes Gait Pattern: Step-to pattern;Decreased hip/knee flexion - right;Trunk rotated posteriorly on right;Narrow base of support;Right flexed knee in stance;Trunk flexed Stairs / Additional Locomotion Stairs: Yes Stairs Assistance: 3: Mod assist Stairs Assistance Details: Manual facilitation for weight shifting;Verbal cues for technique;Verbal cues for sequencing Stair Management Technique: Two rails Number of Stairs: 12 Height of Stairs: 6 (and 3) Wheelchair Mobility Wheelchair Mobility: Yes Wheelchair Assistance: 4: Min Technical sales engineer Details: Verbal cues for technique;Verbal cues for safe use of DME/AE Wheelchair Propulsion: Left upper extremity;Left lower extremity Wheelchair Parts Management: Needs assistance Distance: 150  Trunk/Postural Assessment  Cervical Assessment Cervical Assessment: Exceptions to  [facility] Cervical AROM Overall Cervical AROM Comments: limited extension; painful with R rotation Thoracic Assessment Thoracic Assessment: Within Functional Limits Lumbar Assessment Lumbar Assessment: Exceptions to [facility] Postural Control Postural Control: Deficits on evaluation Postural Limitations: in sitting, R trunk elongated; L trunk shortened  Balance Balance Balance Assessed: Yes Static Sitting Balance Static Sitting - Balance Support: No upper extremity supported Static Sitting - Level of Assistance: 5: Stand by assistance Dynamic Sitting Balance Dynamic Sitting - Balance Support: No upper extremity supported Dynamic Sitting - Level of Assistance: 5: Stand by assistance Static Standing Balance Static Standing - Balance Support: Left upper extremity supported;During functional activity Static Standing - Level of Assistance: 4: Min assist Dynamic Standing Balance Dynamic  Standing - Balance Support: During functional activity;No upper extremity supported Dynamic Standing - Level of Assistance: 3: Mod assist Extremity Assessment  RUE Assessment RUE Assessment: Exceptions to [facility] RUE Strength RUE Overall Strength Comments: AAROM WFLS for shoulder and elbow.  Increased swelling and pain in wrist and hand secondary to gout flare up.  Currently demonstrates Brunnstrum stage III-IV movement in the right arm and hand.  Mod assist needed to integrate as a gross assist for bathing tasks.  LUE Assessment LUE Assessment: Exceptions to [facility] (AROM and strength WFLs for shoulder and elbow.  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   Refer to Care Plan for Long Term  Goals  Recommendations for other services: None  Discharge Criteria: Patient will be discharged from PT if patient refuses treatment 3 consecutive times without medical reason, if treatment goals not met, if there is a change in medical status, if patient makes no progress towards goals or if patient is discharged from hospital.  The above assessment, treatment plan, treatment alternatives and goals were discussed and mutually agreed upon: by patient  [name] 10/22/2015, 12:30 PM

## 2015-10-22 NOTE — Care Management Note (Signed)
Inpatient [facility] Individual Statement of Services  Patient Name:  [name]  Date:  10/22/2015  Welcome to the [facility].  Our goal is to provide you with an individualized program based on your diagnosis and situation, designed to meet your specific needs.  With this comprehensive rehabilitation program, you will be expected to participate in at least 3 hours of rehabilitation therapies Monday-Friday, with modified therapy programming on the weekends.  Your rehabilitation program will include the following services:  Physical Therapy (PT), Occupational Therapy (OT), Speech Therapy (ST), 24 hour per day rehabilitation nursing, Therapeutic Recreaction (TR), Neuropsychology, Case Management (Social Worker), Rehabilitation Medicine, Nutrition Services and Pharmacy Services  Weekly team conferences will be held on Wednesday to discuss your progress.  Your Social Worker will talk with you frequently to get your input and to update you on team discussions.  Team conferences with you and your family in attendance may also be held.  Expected length of stay: 14 days  Overall anticipated outcome: supervision level  Depending on your progress and recovery, your program may change. Your Social Worker will coordinate services and will keep you informed of any changes. Your Social Worker's name and contact numbers are listed  below.  The following services may also be recommended but are not provided by the [facility] will be made to provide these services after discharge if needed.  Arrangements include referral to agencies that provide these services.  Your insurance has been verified to be:  [employer]-Medicare-expedited appeal Your primary doctor is:  Radio broadcast assistant  Pertinent information will be shared with your doctor and your insurance  company.  Social Worker:  Ovidio Kin, [place] or ([phone]  Information discussed with and copy given to patient by: Elease Hashimoto, 10/22/2015, 2:19 PM

## 2015-10-22 NOTE — Progress Notes (Signed)
Retta Diones, RN Rehab Admission Coordinator Signed Physical Medicine and Rehabilitation  PMR Pre-admission Date of Service: 10/19/2015 12:14 PM  Related encounter: ED to Hosp-Admission (Discharged) from 10/14/2015 in [place]       _0 Hide copied text PMR Admission Coordinator Pre-Admission Assessment  Patient: [name] is an 82 y.o., male MRN: [id number] DOB: Mar 11, 1927 Height: _1  (170.2 cm) Weight: 80.4 kg (177 lb 3.2 oz)                                                                                                                                                                                                                                                                          Insurance Information HMO: Yes    PPO:       PCP:       IPA:       80/20:       OTHER:  Group # [id number] PRIMARY: [employer] Medicare      Policy#: [id number]      Subscriber:  Watt Climes CM Name: Sharlett Iles      Phone#: [phone]     Fax#: [phone] Pre-Cert#: [id number] - See below     Employer: Retired Benefits:  Phone #: [phone]     Name: Purcell Nails. Date:  03/01/15     Deduct:  $0     Out of Pocket Max: $4900 (met $0)      Life Max: unlimited CIR: $345 days 1-5      SNF: $0 days 1-20; $160 days 21-51; $0 days 52-100 Outpatient: medical necessity     Co-Pay: $40 copay Home Health: 100%      Co-Pay: none DME: 80%     Co-Pay: 20% Providers: in network benefits only - Note we are in a denial mode at this time with an expedited appeal in process.  We should know something from the appeal in the next 3 days.  Medicaid Application Date:        Case Manager:   Disability Application Date:        Case Worker:    Emergency Contact Information        Contact Information    Name Relation Home Work  Mobile   Allayne Gitelman Daughter [phone]  [phone]   Lofing,Wanda Daughter [phone]       Current Medical History  Patient Admitting Diagnosis: left  pontine infarct with right hemiparesis dysarthria/dysphagia/processing delays   History of Present Illness: An 82 y.o.malewith history of Gout,CADmaintained on aspirin, SVT, CKDwith creatinine 1.50, T2DM PVD with RLE claudicationwho was admitted on 10/14/15 with difficulty talking, difficulty moving RLE and difficulty with RUE coordination.Per chart review patient lives alone independently using a walker prior to admission.Patient had stopped taking his ASA 8/13 in anticipation of upcoming dental procedure. UDS negative MRI brain/CT angiogram of head and neckdone revealing acute left pontine infarct, generalized atrophy, moderate narrowing midportion of basilar artery and multifocal moderate to severe narrowing of intracranial internal carotid arteries L >R.Carotid dopplers without significant stenosis. Cardiac echo with EF 22-63%, normal systolic function and mild MVR/AVR. [name] recommended increasing ASA 325 mg with plavix X 3 months followed by plavix alone due to BA stenosis. BP goal 130 -150 due to BA stenosis.Subcutaneous heparin for DVT prophylaxis.On the afternoon of 10/20/2015 after arrangements being made to be admitted to CIR patient while sitting up in chair became unresponsive with heart rate in the 40s. Patient had received metoprolol at morning. EKG showed prolongation of the P-P interval with resultant significant pauseand heart rate 56. Rapid response contacted. He was transferred to ICU and cardiology consulted. It was felt asystole likely vagally mediated and no acute indication for pacemaker and cardiology signed off as patient had no additional episodes. Right hand and wrist swollen tender suspect flareup of gout as well as right knee with colchicine and prednisone initiated 10/21/2015. Tolerating a mechanical soft diet. Physical and occupational therapy evaluations completed with recommendations of physical medicine rehabilitation consult. Patient to be admitted for  comprehensive inpatient rehabilitation program.    Total: 3=NIH  Past Medical History      Past Medical History:  Diagnosis Date  . Adenomatous polyp   . CAD (coronary artery disease)   . Chronic kidney disease   . Diverticulosis   . DVT (deep venous thrombosis) ([place])   . Gout   . History of pulmonary embolus (PE)    from a DVT in 2008  . Hyperglycemia   . Hyperlipidemia   . Hypertension   . Mallory-Weiss tear   . Peripheral vascular disease ([place])   . SVT (supraventricular tachycardia) ([place])   . Vitamin B12 deficiency     Family History  family history includes CAD in his father; Cancer in his mother; Heart attack in his father.  Prior Rehab/Hospitalizations: No previous rehab.  Has the patient had major surgery during 100 days prior to admission? No  Current Medications   Current Facility-Administered Medications:  .  0.9 %  sodium chloride infusion, , Intravenous, Continuous, Kelvin Cellar, MD, Last Rate: 75 mL/hr at 10/21/15 0743 .  acetaminophen (TYLENOL) tablet 650 mg, 650 mg, Oral, Q6H PRN, Kelvin Cellar, MD, 650 mg at 10/15/15 1851 .  amoxicillin (AMOXIL) 250 MG/5ML suspension 500 mg, 500 mg, Oral, Q8H, Kelvin Cellar, MD, Stopped at 10/20/15 2200 .  antiseptic oral rinse (CPC / CETYLPYRIDINIUM CHLORIDE 0.05%) solution 7 mL, 7 mL, Mouth Rinse, BID, Patrecia Pour, MD .  aspirin suppository 300 mg, 300 mg, Rectal, Daily, 300 mg at 10/14/15 2305 **OR** aspirin tablet 325 mg, 325 mg, Oral, Daily, Lavina Hamman, MD, 325 mg at 10/21/15 1158 .  bisacodyl (DULCOLAX) suppository 10 mg, 10 mg, Rectal, Daily PRN, Kelvin Cellar, MD .  clopidogrel (PLAVIX) tablet 75 mg, 75 mg, Oral, Daily, Kelvin Cellar, MD, 75 mg at 10/21/15 1158 .  [COMPLETED] colchicine tablet 1.2 mg, 1.2 mg, Oral, Once, 1.2 mg at 10/21/15 1158 **FOLLOWED BY** colchicine tablet 0.6 mg, 0.6 mg, Oral, Once, Patrecia Pour, MD .  docusate sodium (COLACE) capsule 100 mg, 100 mg, Oral,  BID, Kelvin Cellar, MD, 100 mg at 10/21/15 1159 .  heparin injection 5,000 Units, 5,000 Units, Subcutaneous, Q8H, Lavina Hamman, MD, 5,000 Units at 10/21/15 8250 .  hydrALAZINE (APRESOLINE) injection 5 mg, 5 mg, Intravenous, Q6H PRN, Lavina Hamman, MD, 5 mg at 10/16/15 0508 .  hydrALAZINE (APRESOLINE) tablet 10 mg, 10 mg, Oral, Q6H, Patrecia Pour, MD, 10 mg at 10/21/15 1158 .  insulin aspart (novoLOG) injection 0-9 Units, 0-9 Units, Subcutaneous, Q4H, Lavina Hamman, MD, 1 Units at 10/20/15 2024 .  polyethylene glycol (MIRALAX / GLYCOLAX) packet 17 g, 17 g, Oral, Daily, Kelvin Cellar, MD, 17 g at 10/21/15 1200 .  pravastatin (PRAVACHOL) tablet 20 mg, 20 mg, Oral, q1800, Rosalin Hawking, MD, 20 mg at 10/19/15 1709 .  predniSONE (DELTASONE) tablet 40 mg, 40 mg, Oral, Q breakfast, Patrecia Pour, MD, 40 mg at 10/21/15 1159 .  senna-docusate (Senokot-S) tablet 1 tablet, 1 tablet, Oral, QHS PRN, Lavina Hamman, MD  Patients Current Diet: DIET DYS 3 Room service appropriate? Yes; Fluid consistency: Thin  Precautions / Restrictions Precautions Precautions: Fall Restrictions Weight Bearing Restrictions: No   Has the patient had 2 or more falls or a fall with injury in the past year?No  Prior Activity Level Community (5-7x/wk): Went out daily, was driving.  Home Assistive Devices / Equipment Home Assistive Devices/Equipment: None Home Equipment: Bedside commode, Environmental consultant - 2 wheels, Cane - single point  Prior Device Use: Indicate devices/aids used by the patient prior to current illness, exacerbation or injury? None.  Daughter reports that no device was used at home.  Prior Functional Level Prior Function Level of Independence: Independent  Self Care: Did the patient need help bathing, dressing, using the toilet or eating?  Independent  Indoor Mobility: Did the patient need assistance with walking from room to room (with or without device)? Independent  Stairs: Did the patient need  assistance with internal or external stairs (with or without device)? Independent  Functional Cognition: Did the patient need help planning regular tasks such as shopping or remembering to take medications? Independent  Current Functional Level Cognition Arousal/Alertness: Awake/alert Overall Cognitive Status: Within Functional Limits for tasks assessed Difficult to assess due to:  (Pt emotional and crying throughout session) Orientation Level: Oriented to person, Oriented to place, Disoriented to situation, Disoriented to time Attention: Sustained Sustained Attention: Appears intact Memory: Appears intact Awareness: Appears intact Problem Solving: Appears intact    Extremity Assessment (includes Sensation/Coordination) Upper Extremity Assessment: Defer to OT evaluation  Lower Extremity Assessment: RLE deficits/detail, LLE deficits/detail RLE Deficits / Details: proximal weaknesses 4/5, generally coordinated. LLE Deficits / Details: WFL, mild proximal weakness   ADLs Overall ADL's : Needs assistance/impaired Grooming: Wash/dry hands, Minimal assistance, Standing Grooming Details (indicate cue type and reason): incr time required Toilet Transfer: Minimal assistance, Ambulation, RW, BSC Toilet Transfer Details (indicate cue type and reason): cues for positioning and to make sure patient does not pull down pants prior to proper alignment with toilet.  Toileting - Clothing Manipulation Details (indicate cue type and reason): pt reports retraction of penis and inability to properly void bladder. RN Marc Morgans made aware. pt with  minimal void at this time Functional mobility during ADLs: Minimal assistance, Rolling walker General ADL Comments: pt requires cues to initiate sit<>stand and to correctly sequence RW.    Mobility Overal bed mobility: Needs Assistance Bed Mobility: Supine to Sit, Sit to Supine Supine to sit: Min assist Sit to supine: Min assist General bed mobility comments: min  asssit to come to sitting for LEs and elevation of trunk   Transfers Overall transfer level: Needs assistance Equipment used: 1 person hand held assist Transfers: Sit to/from Stand Sit to Stand: Min assist, +2 safety/equipment General transfer comment: min assist to boost to stand to stabilize balance upon standing.  Pt needed handheld assist on left hand.  Right hand with gout and very sore.  Pt was able to stand for 2 min at EOB shifting weight and picking up one LE at a time.  Pt demonstrated incr weight on right hemibody.  Needed cues for midline as head even flexed to right.    Ambulation / Gait / Stairs / Wheelchair Mobility Ambulation/Gait Ambulation/Gait assistance: Min assist, Min guard Ambulation Distance (Feet): 130 Feet Assistive device: Rolling walker (2 wheeled) Gait Pattern/deviations: Step-through pattern General Gait Details: mildly unsteady worsening with fatigue, but better with RW.  Worked on heel/toe gait Gait velocity interpretation: Below normal speed for age/gender   Posture / Balance Balance Overall balance assessment: Needs assistance Sitting-balance support: Feet supported, Single extremity supported Sitting balance-Leahy Scale: Fair Standing balance support: Single extremity supported, During functional activity Standing balance-Leahy Scale: Fair Standing balance comment: Pt was able to stand with single UE support on one side and guard on other side with min to mod assist for static stance.  Pt limited by right UE and LE pain due to gout.    Special needs/care consideration BiPAP/CPAP No CPM No Continuous Drip IV No Dialysis No         Life Vest No Oxygen No Special Bed No Trach Size No Wound Vac (area) No     Skin No                            Bowel mgmt: Last BM 10/19/15.  Reports problems of constipation. Bladder mgmt: Voiding up in bathroom with assistance Diabetic mgmt Yes, on oral medications at home.   Previous Home Environment Living  Arrangements: Alone  Lives With: Alone Available Help at Discharge: Family, Available 24 hours/day Type of Home: House Home Layout: One level Home Access: Stairs to enter Entrance Stairs-Rails: None Entrance Stairs-Number of Steps: 2+1 Bathroom Shower/Tub: Public librarian, Multimedia programmer: Standard Home Care Services: No Additional Comments: Unable to obtain as pt was very emotional and crying when asked about home  Discharge Living Setting Plans for Discharge Living Setting: House, Lives with (comment) (Plans home with daughter and son-in-law.)  Also has a granddaughter and a great granddaughter in daughters home. Type of Home at Discharge: House Discharge Home Layout: Two level, Laundry or work area in basement, Able to live on main level with bedroom/bathroom Alternate Level Stairs-Number of Steps: Flight Discharge Home Access: Stairs to enter Technical brewer of Steps: 3 Does the patient have any problems obtaining your medications?: No  Social/Family/Support Systems Patient Roles: Parent (Has 2 daughters, grand daughter, great grand daughter.) Contact Information: [name] Wimbs - daughter Anticipated Caregiver: daughter Anticipated Caregiver's Contact Information: [name] - daughter - [phone] Ability/Limitations of Caregiver: Daughter can work from home and provide supervision Caregiver Availability: 24/7  Discharge Plan Discussed with Primary Caregiver: Yes Is Caregiver In Agreement with Plan?: Yes Does Caregiver/Family have Issues with Lodging/Transportation while Pt is in Rehab?: No  Goals/Additional Needs Patient/Family Goal for Rehab: PT/OT mod I, SLP mod I and supervision goals Expected length of stay: 7-10 days Cultural Considerations: [employer] Dietary Needs: Dys 3, thin liquids Equipment Needs: TBD Pt/Family Agrees to Admission and willing to participate: Yes Program Orientation Provided & Reviewed with Pt/Caregiver Including  Roles  & Responsibilities: Yes  Decrease burden of Care through IP rehab admission: N/A  Possible need for SNF placement upon discharge: Not anticipated  Patient Condition: This patient's medical and functional status has changed since the consult dated: 10/16/15 in which the Rehabilitation Physician determined and documented that the patient's condition is appropriate for intensive rehabilitative care in an inpatient rehabilitation facility. See "History of Present Illness" (above) for medical update. Functional changes are: Currently requiring min assist for transfers and min to min guard assist 130 feet RW. Patient's medical and functional status update has been discussed with the Rehabilitation physician and patient remains appropriate for inpatient rehabilitation. Will admit to inpatient rehab today.   Preadmission Screen Completed By:  Retta Diones, 10/21/2015 1:49 PM ______________________________________________________________________   Discussed status with Dr. Posey Pronto on 10/21/15 at 1348 and received telephone approval for admission today.  Admission Coordinator:  Retta Diones, time1348/Date08/23/17       Cosigned by: Ankit Lorie Phenix, MD at 10/21/2015 1:52 PM  Revision History

## 2015-10-22 NOTE — Progress Notes (Signed)
82 y.o.malewith history of Gout,CADMaintained on aspirin, SVT, CKDwith creatinine 1.50, T2DM PVD with RLE claudicationwho was admitted on 10/14/15 with difficulty talking, difficulty moving RLE and difficulty with RUE coordination.Per chart review patient lives alone independently using a walker prior to admission.Patient had stopped taking his ASA 8/13 in anticipation of upcoming dental procedure. UDS negative MRI brain/CT angiogram of head and neckdone revealing acute left pontine infarct, generalized atrophy, moderate narrowing midportion of basilar artery and multifocal moderate to severe narrowing of intracranial internal carotid arteries L >R.Carotid dopplers without significant stenosis. Cardiac echo with EF 16-10%, normal systolic function and mild MVR/AVR. [name] recommended increasing ASA 325 mg with plavix X 3 months followed by plavix alone due to BA stenosis. BP goal 130 -150 due to BA stenosis.Subcutaneous heparin for DVT prophylaxis.On the afternoon of 10/20/2015 after arrangements being made to be admitted to CIR patient while sitting up in chair became unresponsive with heart rate in the 40s. Patient had received metoprolol at morning. EKG showed prolongation of the P-P interval with resultant significant pauseand heart rate 56. Rapid response contacted. He was transferred to ICU and cardiology consulted. It was felt asystole likely vagally mediated and no acute indication for pacemaker and cardiology signed off as patient had no additional episodes. Right hand and wrist swollen tender suspect flareup of gout as well as right knee with colchicine and prednisone initiated 10/21/2015  Subjective/Complaints: Gout in knuckles of the Right hand still bothering him, used prednisone at home for flares  ROS- denies CP,SOB, N/V/D  Objective: Vital Signs: Blood pressure (!) 145/61, pulse 68, temperature 98.6 F (37 C), temperature source Oral, resp. rate 18, height _0  (1.727 m),  weight 85.1 kg (187 lb 9.8 oz), SpO2 96 %. Dg Esophagus  Result Date: 10/21/2015 CLINICAL DATA:  Dysphagia. EXAM: ESOPHOGRAM/BARIUM SWALLOW TECHNIQUE: Single contrast examination was performed using  thin barium. FLUOROSCOPY TIME:  Fluoroscopy Time:  0 minutes 42 seconds COMPARISON:  None. FINDINGS: Mucosa and motility of the esophagus are normal. No appreciable hiatal hernia, stricture, or mass. There was no aspiration with thin barium. IMPRESSION: Normal barium esophagram. Electronically Signed   By: Lorriane Shire M.D.   On: 10/21/2015 09:53   Results for orders placed or performed during the hospital encounter of 10/21/15 (from the past 72 hour(s))  CBC WITH DIFFERENTIAL     Status: Abnormal   Collection Time: 10/21/15  6:13 PM  Result Value Ref Range   WBC 9.4 4.0 - 10.5 K/uL   RBC 5.25 4.22 - 5.81 MIL/uL   Hemoglobin 15.9 13.0 - 17.0 g/dL   HCT 47.5 39.0 - 52.0 %   MCV 90.5 78.0 - 100.0 fL   MCH 30.3 26.0 - 34.0 pg   MCHC 33.5 30.0 - 36.0 g/dL   RDW 13.6 11.5 - 15.5 %   Platelets 155 150 - 400 K/uL   Neutrophils Relative % 91 %   Neutro Abs 8.5 (H) 1.7 - 7.7 K/uL   Lymphocytes Relative 6 %   Lymphs Abs 0.6 (L) 0.7 - 4.0 K/uL   Monocytes Relative 3 %   Monocytes Absolute 0.3 0.1 - 1.0 K/uL   Eosinophils Relative 0 %   Eosinophils Absolute 0.0 0.0 - 0.7 K/uL   Basophils Relative 0 %   Basophils Absolute 0.0 0.0 - 0.1 K/uL  Comprehensive metabolic panel     Status: Abnormal   Collection Time: 10/21/15  6:13 PM  Result Value Ref Range   Sodium 134 (L) 135 - 145 mmol/L  Potassium 4.2 3.5 - 5.1 mmol/L   Chloride 104 101 - 111 mmol/L   CO2 22 22 - 32 mmol/L   Glucose, Bld 233 (H) 65 - 99 mg/dL   BUN 18 6 - 20 mg/dL   Creatinine, Ser 1.44 (H) 0.61 - 1.24 mg/dL   Calcium 9.2 8.9 - 10.3 mg/dL   Total Protein 7.1 6.5 - 8.1 g/dL   Albumin 3.4 (L) 3.5 - 5.0 g/dL   AST 28 15 - 41 U/L   ALT 46 17 - 63 U/L   Alkaline Phosphatase 52 38 - 126 U/L   Total Bilirubin 0.8 0.3 - 1.2 mg/dL    GFR calc non Af Amer 42 (L) >60 mL/min   GFR calc Af Amer 49 (L) >60 mL/min    Comment: (NOTE) The eGFR has been calculated using the CKD EPI equation. This calculation has not been validated in all clinical situations. eGFR's persistently <60 mL/min signify possible Chronic Kidney Disease.    Anion gap 8 5 - 15  Glucose, capillary     Status: Abnormal   Collection Time: 10/21/15  8:36 PM  Result Value Ref Range   Glucose-Capillary 264 (H) 65 - 99 mg/dL     HEENT: normal Cardio: RRR and no murmur Resp: CTA B/L and unlabored GI: BS positive and NT, ND Extremity:  Pulses positive and No Edema Skin:   Intact Neuro: Alert/Oriented, Normal Sensory, Abnormal Motor 3- R delt, bi, tri, grip, 4/5 R HF, KE , ADF, Abnormal FMC Ataxic/ dec FMC and Dysarthric Musc/Skel:  Other Erythema , tenderness and sweling R MCP 2 and 3 Gen NAD   Assessment/Plan: 1. Functional deficits secondary to Left pontine infarct which require 3+ hours per day of interdisciplinary therapy in a comprehensive inpatient rehab setting. Physiatrist is providing close team supervision and 24 hour management of active medical problems listed below. Physiatrist and rehab team continue to assess barriers to discharge/monitor patient progress toward functional and medical goals. FIM:                   Function - Comprehension Comprehension: Auditory  Function - Expression Expression: Verbal        Function - Memory Patient normally able to recall (first 3 days only): Current season, Location of own room, That he or she is in a hospital  Medical Problem List and Plan:   1.  Right hemiparesis dysarthria/dysphagia secondary to left pontine infarct, initiate CIR, PT, OT, SLP 2.  DVT Prophylaxis/Anticoagulation: Subcutaneous heparin. Monitor platelet count and any signs of bleeding 3. Pain Management: Tylenol as needed 4. Hypertension. Lopressor discontinued due to bradycardia.Continue hydralazine 10 mg  every 6 hours,  Monitor with increased mobility 5. Sinus arrest due to vagal spell. Beta blocker has been discontinued for now. No additional episodes. Follow-up per cardiology services. No plan at this time for pacemaker. 6. Neuropsych: This patient is not fully capable of making decisions on his own behalf. 7. Skin/Wound Care: Routine skin check 8. Fluids/Electrolytes/Nutrition: Routine I&O's with follow-up chemistries 9. Chronic renal insufficiency. Baseline creatinine 1.5. Follow-up chemistries 10. Diet-controlled diabetes mellitus. Hemoglobin A1c 7.3. Check blood sugars before meals and at bedtime. Diabetic teaching.Monitor as activity increased CBG (last 3)   Recent Labs  10/21/15 1206 10/21/15 1627 10/21/15 2036  GLUCAP 119* 241* 264*     11. CAD/SVT. No chest pain or shortness of breath.  12. Gout. Placed on colchicine 1 dose as well as prednisone 40 mg daily starting this am, expect improvements  13. Hyperlipidemia. Pravachol   LOS (Days) 1 A FACE TO FACE EVALUATION WAS PERFORMED  Sherronda Sweigert E 10/22/2015, 8:27 AM

## 2015-10-22 NOTE — Evaluation (Signed)
Occupational Therapy Assessment and Plan  Patient Details  Name: [name] MRN: [id number] Date of Birth: 14-Oct-1927  OT Diagnosis: abnormal posture, acute pain, cognitive deficits, hemiplegia affecting dominant side, muscle weakness (generalized) and pain in joint Rehab Potential: Rehab Potential (ACUTE ONLY): Good ELOS: 14-16 days   Today's Date: 10/22/2015 OT Individual Time: 5852-7782 OT Individual Time Calculation (min): 76 min      Problem List: Patient Active Problem List   Diagnosis Date Noted  . Left pontine CVA ([place]) 10/21/2015  . Dysarthria, post-stroke   . Right hemiparesis ([place])   . Sinus arrest   . CKD (chronic kidney disease)   . DM type 2 without retinopathy ([place])   . Coronary artery disease involving native coronary artery of native heart without angina pectoris   . Acute idiopathic gout of right hand   . Aphasia   . Dysphagia, post-stroke   . HLD (hyperlipidemia)   . Essential hypertension   . Orthostatic hypotension   . 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[place]) 10/14/2015  . CVA (cerebral infarction) 10/14/2015  . Accelerated hypertension 10/14/2015  . Type 2 diabetes mellitus ([place]) 10/14/2015  . Peripheral vascular disease, unspecified ([place]) 06/04/2013    Past Medical History:  Past Medical History:  Diagnosis Date  . Adenomatous polyp   . CAD (coronary artery disease)   . Chronic kidney disease   . Diverticulosis   . DVT (deep venous thrombosis) ([place])   . Gout   . History of pulmonary embolus (PE)    from a DVT in 2008  . Hyperglycemia   . Hyperlipidemia   . Hypertension   . Mallory-Weiss tear   . Peripheral vascular disease ([place])   . SVT (supraventricular tachycardia) ([place])   . Vitamin B12 deficiency    Past Surgical History:  Past Surgical History:  Procedure Laterality Date  .  PTCA of coronary lesion Right 1995  . APPENDECTOMY    . PR VEIN BYPASS GRAFT,AORTO-FEM-POP Left    femoral-popliteal BP by Dr.  Drucie Opitz  . repair of aal fissure   1978    Assessment & Plan Clinical Impression: Patient is a 82 y.o. year old male with recent admission to the hospital on 10/14/15 with difficulty talking, difficulty moving RLE and difficulty with RUE coordination.Per chart review patient lives alone independently using a walker prior to admission.Patient had stopped taking his ASA 8/13 in anticipation of upcoming dental procedure. UDS negative MRI brain/CT angiogram of head and neckdone revealing acute left pontine infarct, generalized atrophy, moderate narrowing midportion of basilar artery and multifocal moderate to severe narrowing of intracranial internal carotid arteries L >R.Carotid dopplers without significant stenosis .  Patient transferred to CIR on 10/21/2015 .    Patient currently requires max with basic self-care skills secondary to muscle weakness, impaired timing and sequencing, unbalanced muscle activation and decreased coordination, decreased awareness, decreased problem solving, decreased safety awareness and decreased memory and decreased sitting balance, decreased standing balance, decreased postural control, hemiplegia and decreased balance strategies.  Prior to hospitalization, patient could complete ADls with independent .  Patient will benefit from skilled intervention to decrease level of assist with basic self-care skills and increase independence with basic self-care skills prior to discharge home with care partner.  Anticipate patient will require 24 hour supervision and follow up home health.  OT - End of Session Activity Tolerance: Tolerates 30+ min activity with multiple rests Endurance Deficit: Yes (Simultaneous filing. User may not have seen previous data.) Endurance  Deficit Description: DOE stairs OT Assessment Rehab Potential (ACUTE ONLY): Good Barriers to Discharge: Decreased caregiver support Barriers to Discharge Comments: lives alone but will go to daughters house  at discharge OT Patient demonstrates impairments in the following area(s): Balance;Cognition;Endurance;Motor;Pain;Safety;Sensory OT Basic ADL's Functional Problem(s): Eating;Grooming;Bathing;Toileting OT Transfers Functional Problem(s): Tub/Shower;Toilet OT Additional Impairment(s): Fuctional Use of Upper Extremity OT Plan OT Intensity: Minimum of 1-2 x/day, 45 to 90 minutes OT Frequency: 5 out of 7 days OT Duration/Estimated Length of Stay: 14-16 days OT Treatment/Interventions: Balance/vestibular training;Cognitive remediation/compensation;Community reintegration;DME/adaptive equipment instruction;Disease mangement/prevention;Discharge planning;Functional electrical stimulation;Functional mobility training;Neuromuscular re-education;Patient/family education;Pain management;Self Care/advanced ADL retraining;Therapeutic Activities;UE/LE Coordination activities;Therapeutic Exercise;UE/LE Strength taining/ROM;Visual/perceptual remediation/compensation;Splinting/orthotics;Wheelchair propulsion/positioning OT Self Feeding Anticipated Outcome(s): modified independent OT Basic Self-Care Anticipated Outcome(s): supervision  OT Toileting Anticipated Outcome(s): supervision OT Bathroom Transfers Anticipated Outcome(s): supervision OT Recommendation Patient destination: Home Follow Up Recommendations: Home health OT;24 hour supervision/assistance Equipment Recommended: To be determined   Skilled Therapeutic Intervention Pt completed selfcare re-training sit to stand at the sink this session.  Mod assist for initial transfer from the bed to the wheelchair with hand held assist.  Pt continually attempts to use the RUE during session but needs mod assist for support at the shoulder and elbow to use the washcloth.  He continues to be limited by pain in the right wrist and digits secondary to gout as well but he is able to demonstrate 75% digit flexion and 75% gross digit extension.  Increased right knee  flexion and lean to the right noted with standing when pulling pants over hips and washing peri area.  Decreased flexibility also noted when attempting to wash his feet and donn LB clothing.  Pt left in wheelchair with call button in reach on the right side and safety belt in place.  RUE positioned on pillow as well with instructions to maintain digit extension as much as possible.  Also discussed expectations of needing supervision 24 hour at discharge.    OT Evaluation Precautions/Restrictions  Precautions Precautions: Fall Precaution Comments: right hemiparesis with increased pain in the left wrist and hand secondary to gout Restrictions Weight Bearing Restrictions: No   Pain Pain Assessment Pain Assessment: Faces Pain Score: 10-Worst pain ever Faces Pain Scale: Hurts little more Pain Type: Acute pain Pain Location: Hand (gout) Pain Orientation: Right Pain Descriptors / Indicators: Discomfort Pain Intervention(s): Medication (See eMAR) Home Living/Prior [place] expects to be discharged to:: Private residence Living Arrangements: Children Available Help at Discharge: Family Type of Home: House (dtr's 3 story) Home Access: Stairs to enter Technical brewer of Steps: dtr's 3STE carport into house on middle level Entrance Stairs-Rails: None (dtr's) Home Layout: Multi-level (dtr's) Bathroom Shower/Tub: Government social research officer Accessibility: Yes Additional Comments: at dtr's, full bath and BR avaliable on entry level, per pt  Lives With: Alone (dtr plans for pt to go home to her house) IADL History Homemaking Responsibilities: Yes Prior Function Level of Independence: Independent with basic ADLs, Independent with homemaking with ambulation, Independent with transfers, Independent with gait  Able to Take Stairs?: Yes Driving: Yes Vocation: Retired Physicist, medical, retired) Leisure: Hobbies-yes (Comment) Comments:  listens to music ADL  See function section of chart for details  Vision/Perception  Vision- History Baseline Vision/History: Wears glasses Wears Glasses: Reading only Patient Visual Report: No change from baseline Vision- Assessment Vision Assessment?: Yes Ocular Range of Motion: Within Functional Limits Tracking/Visual Pursuits: Decreased smoothness of vertical tracking;Decreased smoothness of horizontal tracking;Requires cues, head turns, or add eye shifts to  track;Other (comment) (Pt with lost visual fixation with tracking in all directions.  ) Saccades: Additional head turns occurred during testing Convergence: Impaired (comment) (No convergence noted with testing) Visual Fields: No apparent deficits  Cognition Overall Cognitive Status: Impaired/Different from baseline Arousal/Alertness: Awake/alert Orientation Level: Person;Place;Situation Person: Oriented Place: Oriented Situation: Oriented Year: 2017 Month: August Day of Week: Correct Memory: Impaired Immediate Memory Recall: Sock;Blue;Bed Memory Recall: Sock;Blue Memory Recall Sock: Without Cue Memory Recall Blue: Without Cue Attention: Sustained Sustained Attention: Appears intact Awareness: Impaired Awareness Impairment: Intellectual impairment;Emergent impairment Problem Solving: Impaired Problem Solving Impairment: Verbal basic;Functional basic Safety/Judgment: Impaired Comments: Pt with decreased awareness of safety.  Pt unable to state that it would not be safe to get up without assistance secondary to balance after therapist completed transfer to the wheelchair from bed.   Sensation Sensation Light Touch: Appears Intact Stereognosis: Not tested Proprioception: Appears Intact Coordination Gross Motor Movements are Fluid and Coordinated: No Fine Motor Movements are Fluid and Coordinated: No Coordination and Movement Description: Pt demonstrates Brunnstrum stage III-IV movement in the RUE with functional  tasks.  Needs mod assist to integrate into bathing but attempts to use it frequently without the need for cueing as this was his dominant hand.  Heel Shin Test: reduced excursion and speed LLE Motor  Motor Motor: Hemiplegia;Abnormal postural alignment and control Motor - Skilled Clinical Observations: Right hemiparesis with the RUE being more impaired than the LE Mobility  Bed Mobility Bed Mobility: Supine to Sit Rolling Right: 5: Supervision Rolling Left: 5: Supervision Rolling Left Details: Verbal cues for technique Right Sidelying to Sit: 5: Supervision Right Sidelying to Sit Details: Verbal cues for precautions/safety;Verbal cues for technique Supine to Sit: 3: Mod assist;HOB flat Supine to Sit Details: Verbal cues for sequencing;Verbal cues for technique Sit to Supine: 4: Min guard Transfers Transfers: Sit to Stand;Stand to Sit Sit to Stand: Without upper extremity assist;With armrests;From chair/3-in-1;4: Min assist Stand to Sit: 4: Min assist;With armrests;With upper extremity assist  Trunk/Postural Assessment  Cervical Assessment Cervical Assessment: Exceptions to [facility] Cervical AROM Overall Cervical AROM Comments: limited extension; painful with R rotation Thoracic Assessment Thoracic Assessment: Within Functional Limits Lumbar Assessment Lumbar Assessment: Exceptions to [facility] Postural Control Postural Control: Deficits on evaluation Postural Limitations: in sitting, R trunk elongated; L trunk shortened  Balance Balance Balance Assessed: Yes Static Sitting Balance Static Sitting - Balance Support: No upper extremity supported Static Sitting - Level of Assistance: 5: Stand by assistance Dynamic Sitting Balance Dynamic Sitting - Balance Support: No upper extremity supported Dynamic Sitting - Level of Assistance: 5: Stand by assistance Static Standing Balance Static Standing - Balance Support: Left upper extremity supported;During functional activity Static Standing -  Level of Assistance: 4: Min assist Dynamic Standing Balance Dynamic Standing - Balance Support: During functional activity;No upper extremity supported Dynamic Standing - Level of Assistance: 3: Mod assist Extremity/Trunk Assessment RUE Assessment RUE Assessment: Exceptions to [facility] RUE Strength RUE Overall Strength Comments: AAROM WFLS for shoulder and elbow.  Increased swelling and pain in wrist and hand secondary to gout flare up.  Currently demonstrates Brunnstrum stage III-IV movement in the right arm and hand.  Mod assist needed to integrate as a gross assist for bathing tasks.  LUE Assessment LUE Assessment: Exceptions to [facility] (AROM and strength WFLs for shoulder and elbow.  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   Refer to Care  Plan for Long Term Goals  Recommendations for other services: None  Discharge Criteria: Patient will be discharged from OT if patient refuses treatment 3 consecutive times without medical reason, if treatment goals not met, if there is a change in medical status, if patient makes no progress towards goals or if patient is discharged from hospital.  The above assessment, treatment plan, treatment alternatives and goals were discussed and mutually agreed upon: by patient  Jamyron Redd OTR/L 10/22/2015, 12:31 PM

## 2015-10-22 NOTE — Progress Notes (Signed)
Meredith Staggers, MD Physician Signed Physical Medicine and Rehabilitation  Consult Note Date of Service: 10/16/2015 8:36 AM  Related encounter: ED to Hosp-Admission (Discharged) from 10/14/2015 in [place] All Collapse All   [] Hide copied text [] Hover for attribution information      Physical Medicine and Rehabilitation Consult  Reason for Consult: Slurred speech and difficulty talking.   Referring Physician: Dr. Coralyn Pear   HPI: [name] is a 82 y.o. male with history of CAD, SVT, CKD, T2DM PVD with RLE claudication who was admitted on 10/14/15 with difficulty talking, difficulty moving RLE and difficulty with RUE coordination.  Patient had stopped taking his ASA 8/13 in anticipation of upcoming dental procedure. UDS negative  MRI brain done revealing acute left pontine infarct, generalized atrophy, moderate narrowing midportion of basilar artery and multifocal moderate to severe narrowing of intracranial internal carotid arteries L > R.Carotid dopplers without significant stenosis.  Cardiac echo with EF [id number], normal systolic function and mild MVR/AVR.  [name] recommended increasing ASA 325 mg with plavix X 3 months followed by plavix alone due to BA stenosis. BP goal 130 -150 due to BA stenosis. OT evaluations done yesterday and CIR recommended for balance deficits. .   Review of Systems  Constitutional: Negative for fever.  Eyes: Positive for blurred vision.  Respiratory: Negative for cough.   Cardiovascular: Negative for chest pain.  Gastrointestinal: Negative.   Genitourinary: Negative.   Musculoskeletal: Positive for joint pain. Negative for falls, myalgias and neck pain.  Skin: Negative for rash.  Neurological: Positive for dizziness and focal weakness. Negative for headaches.  Endo/Heme/Allergies: Negative.   Psychiatric/Behavioral: Negative.   All other systems reviewed and are negative.     Past Medical History:    Diagnosis Date  . Adenomatous polyp   . CAD (coronary artery disease)   . Chronic kidney disease   . Diverticulosis   . DVT (deep venous thrombosis) ([place])   . Gout   . History of pulmonary embolus (PE)    from a DVT in 2008  . Hyperglycemia   . Hyperlipidemia   . Hypertension   . Mallory-Weiss tear   . Peripheral vascular disease ([place])   . SVT (supraventricular tachycardia) ([place])   . Vitamin B12 deficiency          Past Surgical History:  Procedure Laterality Date  .  PTCA of coronary lesion Right 1995  . APPENDECTOMY    . PR VEIN BYPASS GRAFT,AORTO-FEM-POP Left    femoral-popliteal BP by Dr. Drucie Opitz  . repair of aal fissure   1978          Family History  Problem Relation Age of Onset  . Cancer Mother     bladder  . CAD Father   . Heart attack Father     Social History:  Widowed. Retired Air traffic controller.  He drives and babysit's his granddaughter daily. Per reports that he has never smoked. He has never used smokeless tobacco. He reports that he does not drink alcohol or use drugs.         Allergies  Allergen Reactions  . Lipitor [Atorvastatin]     Muscle pain  . Zetia [Ezetimibe] Hives          Medications Prior to Admission  Medication Sig Dispense Refill  . amoxicillin (AMOXIL) 500 MG capsule Take 500 mg by mouth every 6 (six) hours.     Marland Kitchen aspirin 81 MG tablet Take  81 mg by mouth daily.    Marland Kitchen losartan (COZAAR) 50 MG tablet Take 50 mg by mouth daily.    . metoprolol tartrate (LOPRESSOR) 25 MG tablet Take 25 mg by mouth 2 (two) times daily.      Home: Home Living Family/patient expects to be discharged to:: Private residence Living Arrangements: Alone Available Help at Discharge: Family Type of Home: [place]: [place] - 2 wheels Additional Comments: Unable to obtain as pt was very emotional and crying when asked about home  Lives With: Alone  Functional History: Prior  Function Level of Independence: Independent Functional Status:  Mobility: Bed Mobility Overal bed mobility: Needs Assistance Bed Mobility: Supine to Sit, Sit to Supine Supine to sit: Min assist Sit to supine: Min assist General bed mobility comments: Min assist for trunk support to come to sitting. Assist to reposition in bed as well.  Transfers Overall transfer level: Needs assistance Equipment used: Rolling walker (2 wheeled), None Transfers: Sit to/from Stand Sit to Stand: Min assist General transfer comment: Min assist for boost to stand to stabilize balance upon standing. Initial sit-stand pt attempted to stand without RW and was unable to take steps without UE support from therapist. Pt performed much better with RW and required min guard assist for short-distance ambulation.      ADL:    Cognition: Cognition Overall Cognitive Status: Difficult to assess Arousal/Alertness: Awake/alert Orientation Level: Oriented X4 Attention: Sustained Sustained Attention: Appears intact Memory: Appears intact Awareness: Appears intact Problem Solving: Appears intact Cognition Arousal/Alertness: Awake/alert Behavior During Therapy:  (emotionally labile) Overall Cognitive Status: Difficult to assess Difficult to assess due to:  (Pt emotional and crying throughout session)  Blood pressure (!) 202/73, pulse 62, temperature 97.6 F (36.4 C), temperature source Oral, resp. rate 17, height 5\' 7"  (1.702 m), weight 80.4 kg (177 lb 3.2 oz), SpO2 96 %. Physical Exam  Constitutional: He appears well-developed.  HENT:  Head: Normocephalic.  Eyes: Pupils are equal, round, and reactive to light.  Neck: Normal range of motion.  Cardiovascular: Normal rate.   Respiratory: Effort normal.  GI: Soft.  Musculoskeletal: Normal range of motion.  Neurological:  Speech slurred. Has word finding deficits and delays with processing. RUE 4 to 4+/5 with decreased [place]. RLE 4+/5. No gross sensory abnl.    Psychiatric: He has a normal mood and affect. His behavior is normal.    Lab Results Last 24 Hours       Results for orders placed or performed during the hospital encounter of 10/14/15 (from the past 24 hour(s))  Glucose, capillary     Status: Abnormal   Collection Time: 10/15/15 12:40 PM  Result Value Ref Range   Glucose-Capillary 160 (H) 65 - 99 mg/dL  Glucose, capillary     Status: Abnormal   Collection Time: 10/15/15  5:07 PM  Result Value Ref Range   Glucose-Capillary 148 (H) 65 - 99 mg/dL  Glucose, capillary     Status: Abnormal   Collection Time: 10/15/15  8:10 PM  Result Value Ref Range   Glucose-Capillary 195 (H) 65 - 99 mg/dL  Glucose, capillary     Status: Abnormal   Collection Time: 10/16/15 12:27 AM  Result Value Ref Range   Glucose-Capillary 132 (H) 65 - 99 mg/dL  Glucose, capillary     Status: Abnormal   Collection Time: 10/16/15  4:36 AM  Result Value Ref Range   Glucose-Capillary 116 (H) 65 - 99 mg/dL  Glucose, capillary     Status:  Abnormal   Collection Time: 10/16/15  7:49 AM  Result Value Ref Range   Glucose-Capillary 159 (H) 65 - 99 mg/dL      Imaging Results (Last 48 hours)  Mr Brain Wo Contrast  Result Date: 10/14/2015 CLINICAL DATA:  Expressive aphasia. EXAM: MRI HEAD WITHOUT CONTRAST TECHNIQUE: Multiplanar, multiecho pulse sequences of the brain and surrounding structures were obtained without intravenous contrast. COMPARISON:  None. FINDINGS: Acute infarct left pons, moderate in size.  No other acute infarct Mild chronic microvascular ischemic changes in the white matter. Moderate atrophy. Negative for mass or edema.  No shift of the midline structures Pituitary normal in size. Normal orbital contents. Bilateral cataract extraction. Mucosal edema right maxillary sinus.  Remaining sinuses clear. IMPRESSION: Acute infarct in the left pons. Generalized atrophy with mild chronic microvascular ischemia in the white matter. Electronically  Signed   By: Franchot Gallo M.D.   On: 10/14/2015 15:02   Mr Jodene Nam Head/brain [id number] Cm  Result Date: 10/14/2015 CLINICAL DATA:  Expressive aphasia EXAM: MRA HEAD WITHOUT CONTRAST TECHNIQUE: Angiographic images of the Circle of Willis were obtained using MRA technique without intravenous contrast. COMPARISON:  Same day brain MRI FINDINGS: Intracranial internal carotid arteries: There is moderate to severe narrowing of the intracranial left internal carotid artery, worst at the cavernous and clinoid segments. There is multifocal moderate atherosclerotic narrowing of the intracranial right ICA. Anterior cerebral arteries: Normal. Middle cerebral arteries: Possible 1.5 cm aneurysm versus atherosclerotic irregularity at the left P-comm origin. Otherwise normal. Posterior communicating arteries: Absent bilaterally Posterior cerebral arteries: Normal. Basilar artery: There is moderate narrowing of the midportion of the basilar artery. Vertebral arteries: Right-dominant. Minimal flow related enhancement seen within the proximal V4 segment of the left vertebral artery. Superior cerebellar arteries: Normal. Anterior inferior cerebellar arteries: Normal. Posterior inferior cerebellar arteries: Normal. IMPRESSION: 1. Moderate narrowing of the midportion of the basilar artery, likely secondary to atherosclerotic disease. 2. Multifocal moderate to severe narrowing of the intracranial internal carotid arteries, left worse than right. 3. Possible 1.5 mm aneurysm versus atherosclerotic irregularity at the left posterior communicating artery origin. Electronically Signed   By: Ulyses Jarred M.D.   On: 10/14/2015 20:28     Assessment/Plan: Diagnosis: left pontine infarct with right hemiparesis dysarthria/dysphagia/processing delays 1. Does the need for close, 24 hr/day medical supervision in concert with the patient's rehab needs make it unreasonable for this patient to be served in a less intensive setting?  Yes 2. Co-Morbidities requiring supervision/potential complications: HTn, PVD, DM2 3. Due to bladder management, bowel management, safety, skin/wound care, disease management, medication administration, pain management and patient education, does the patient require 24 hr/day rehab nursing? Yes 4. Does the patient require coordinated care of a physician, rehab nurse, PT (1-2 hrs/day, 5 days/week), OT (1-2 hrs/day, 5 days/week) and SLP (1-2 hrs/day, 5 days/week) to address physical and functional deficits in the context of the above medical diagnosis(es)? Yes Addressing deficits in the following areas: balance, endurance, locomotion, strength, transferring, bowel/bladder control, bathing, dressing, feeding, grooming, toileting, cognition, speech, language, swallowing and psychosocial support 5. Can the patient actively participate in an intensive therapy program of at least 3 hrs of therapy per day at least 5 days per week? Yes 6. The potential for patient to make measurable gains while on inpatient rehab is excellent 7. Anticipated functional outcomes upon discharge from inpatient rehab are modified independent  with PT, modified independent with OT, modified independent and supervision with SLP. 8. Estimated rehab length of stay to reach  the above functional goals is: 7-10 days 9. Does the patient have adequate social supports and living environment to accommodate these discharge functional goals? Yes 10. Anticipated D/C setting: Home 11. Anticipated post D/C treatments: HH therapy and Outpatient therapy 12. Overall Rehab/Functional Prognosis: excellent  RECOMMENDATIONS: This patient's condition is appropriate for continued rehabilitative care in the following setting: CIR Patient has agreed to participate in recommended program. Yes Note that insurance prior authorization may be required for reimbursement for recommended care.  Comment: Rehab Admissions Coordinator to follow  up.  Thanks,  Meredith Staggers, MD, [facility]     10/16/2015    Revision History                   Routing History

## 2015-10-22 NOTE — Evaluation (Signed)
Speech Language Pathology Assessment and Plan  Patient Details  Name: [name] MRN: [id number] Date of Birth: 04-27-1927  SLP Diagnosis: Aphasia;Dysarthria;Cognitive Impairments;Dysphagia  Rehab Potential: Good ELOS: 14 days    Today's Date: 10/22/2015 SLP Individual Time: 9798-9211 SLP Individual Time Calculation (min): 60 min    Problem List:  Patient Active Problem List   Diagnosis Date Noted  . Left pontine CVA ([place]) 10/21/2015  . Dysarthria, post-stroke   . Right hemiparesis ([place])   . Sinus arrest   . CKD (chronic kidney disease)   . DM type 2 without retinopathy ([place])   . Coronary artery disease involving native coronary artery of native heart without angina pectoris   . Acute idiopathic gout of right hand   . Aphasia   . Dysphagia, post-stroke   . HLD (hyperlipidemia)   . Essential hypertension   . Orthostatic hypotension   . 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[place]) 10/14/2015  . CVA (cerebral infarction) 10/14/2015  . Accelerated hypertension 10/14/2015  . Type 2 diabetes mellitus ([place]) 10/14/2015  . Peripheral vascular disease, unspecified ([place]) 06/04/2013   Past Medical History:  Past Medical History:  Diagnosis Date  . Adenomatous polyp   . CAD (coronary artery disease)   . Chronic kidney disease   . Diverticulosis   . DVT (deep venous thrombosis) ([place])   . Gout   . History of pulmonary embolus (PE)    from a DVT in 2008  . Hyperglycemia   . Hyperlipidemia   . Hypertension   . Mallory-Weiss tear   . Peripheral vascular disease ([place])   . SVT (supraventricular tachycardia) ([place])   . Vitamin B12 deficiency    Past Surgical History:  Past Surgical History:  Procedure Laterality Date  .  PTCA of coronary lesion Right 1995  . APPENDECTOMY    . PR VEIN BYPASS GRAFT,AORTO-FEM-POP Left    femoral-popliteal BP by Dr. Drucie Opitz  . repair of aal fissure   1978    Assessment / Plan / Recommendation Clinical Impression  [name] is a 82 y.o. male with history of Gout, CADMaintained on aspirin, SVT, CKD with creatinine 1.50, T2DM PVD with RLE claudication who was admitted on 10/14/15 with difficulty talking, difficulty moving RLE and difficulty with RUE coordination. Per chart review patient lives alone independently using a walker prior to admission.  Patient had stopped taking his ASA 8/13 in anticipation of upcoming dental procedure. UDS negative  MRI brain/CT angiogram of head and neck done revealing acute left pontine infarct, generalized atrophy, moderate narrowing midportion of basilar artery and multifocal moderate to severe narrowing of intracranial internal carotid arteries L > R.Carotid dopplers without significant stenosis.  Cardiac echo with EF 94-17%, normal systolic function and mild MVR/AVR.  [name] recommended increasing ASA 325 mg with plavix X 3 months followed by plavix alone due to BA stenosis. BP goal 130 -150 due to BA stenosis. Subcutaneous heparin for DVT prophylaxis.On the afternoon of 10/20/2015 after arrangements being made to be admitted to CIR patient while sitting up in chair became unresponsive with heart rate in the 40s. Patient had received metoprolol at morning. EKG showed prolongation of the P-P interval with resultant significant pause and heart rate 56. Rapid response contacted. He was transferred to ICU and cardiology consulted. It was felt asystole likely vagally mediated and no acute indication for pacemaker and cardiology signed off as patient had no additional episodes. Right hand and wrist swollen tender suspect flareup of gout as  well as right knee with colchicine and prednisone initiated 10/21/2015.  Tolerating a mechanical soft diet. Physical and occupational therapy evaluations completed with recommendations of physical medicine rehabilitation consult. Patient was admitted for comprehensive rehabilitation program on 10/21/2015.  Bedside Swallow Evaluation and speech-language  evaluation completed on 10/22/2015. Bedside Swallow Evaluation revealed mild swallowing deficits c/b by right side facial weakness complicated by poor dentition. Pt provides that he is missing most molars on left and several teeth on right are abcessed. Despite this, pt consumed trials of regular during evaluation with very mild buccal residue on right but cleared with supervision cues. Pt stated that his teeth weren't hurting today and he didn't report any globus sensation. Pt able to recall previous day's barium swallow study and that his esophagus was functioning well. Pt consumed thin liquids via straw without any overt s/s of aspiration. Speech-Language Evaluation revealed mild dysarthria and mild to moderate cognitive deficits. Pt with 75% intelligibility at the simple conversation level d/t imprecise articulation and decreased volume.  Pt reports that he occasionally has difficulty finding the right words but scored within the normal range expressively on the Bedside Western Aphasia Battery. He did however present with mild word finding deficits within more complex conversation when recalling proper names of places/people. Pt with moderate deficits in intellectual awareness and problem solving. Pt reports that he unable to read or write d/t 6th grade education and daughter helps him within the home environment with finances. These deficits impact the patient's overall ability to express complex wants and needs as well as safety with functional self-care tasks. Pt would benefit from skilled SLP intervention in order to maximize his functional independence prior to discharge. Anticipate that pt will require 24 hour supervision at home.    Skilled Therapeutic Interventions          Bedside swallow and speech-language evaluations completed with results and recommendations reviewed with patient. Skilled dysphagia therapy initiated with supervision cues given to clear right buccal cavity with trials of regular  textures. Pt would benefit from further trials of regular before upgrading diet. SLP further failitiated session by providing Min A verbal cues for overarticulation and increasing volume during simple conversation.    SLP Assessment  Patient will need skilled Speech Lanaguage Pathology Services during CIR admission    Recommendations  SLP Diet Recommendations: Dysphagia 3 (Mech soft);Thin Liquid Administration via: Straw Medication Administration: Crushed with puree Compensations: Minimize environmental distractions;Slow rate;Small sips/bites;Lingual sweep for clearance of pocketing;Follow solids with liquid Postural Changes and/or Swallow Maneuvers: Seated upright 90 degrees;Upright 30-60 min after meal Oral Care Recommendations: Oral care BID Patient destination: Home Follow up Recommendations: Home Health SLP Equipment Recommended: None recommended by SLP    SLP Frequency 3 to 5 out of 7 days   SLP Duration  SLP Intensity  SLP Treatment/Interventions 14 days  Minumum of 1-2 x/day, 30 to 90 minutes  Cognitive remediation/compensation;Cueing hierarchy;Dysphagia/aspiration precaution training;Internal/external aids;Functional tasks;Patient/family education;Therapeutic Exercise;Therapeutic Activities;Oral motor exercises;Speech/Language facilitation    Pain Pain Assessment Pain Score: 10-Worst pain ever Pain Type: Acute pain Pain Location: Hand (gout) Pain Intervention(s): Medication (See eMAR)  Prior Functioning Cognitive/Linguistic Baseline: Within functional limits Type of Home: House  Lives With: Alone Available Help at Discharge: Family Education: 6th grade education - unable to write and can read "enough to get by" Vocation: Retired  Function:  Eating Eating   Modified Consistency Diet: Yes Eating Assist Level: Set up assist for;Helper checks for pocketed food   Eating Set Up Assist For: Opening containers;Cutting  food       Cognition Comprehension  Comprehension assist level: Understands basic 90% of the time/cues < 10% of the time  Expression   Expression assist level: Expresses basic 75 - 89% of the time/requires cueing 10 - 24% of the time. Needs helper to occlude trach/needs to repeat words.  Social Interaction Social Interaction assist level: Interacts appropriately with others with medication or extra time (anti-anxiety, antidepressant).  Problem Solving Problem solving assist level: Solves basic 50 - 74% of the time/requires cueing 25 - 49% of the time (Pt has 6th grade education)  Memory Memory assist level: Recognizes or recalls 75 - 89% of the time/requires cueing 10 - 24% of the time   Short Term Goals: Week 1: SLP Short Term Goal 1 (Week 1): Pt will demonstrate efficient mastication and oral clearance of regular textures over 3 observed sessions to demonstrate readiness for diet advancement.  SLP Short Term Goal 2 (Week 1): Pt will demonstrate intellectual awareness within functional tasks with Mod A cues.  SLP Short Term Goal 3 (Week 1): Pt will utilize speech intelligibility strategies at the simple conversation level with Min A verbal cues. to achieve greater than 90% intelligibility.  SLP Short Term Goal 4 (Week 1): Pt will demonstrate functional problem solving for mildly complex tasks with Mod A verbal cues.   Refer to Care Plan for Long Term Goals  Recommendations for other services: None  Discharge Criteria: Patient will be discharged from SLP if patient refuses treatment 3 consecutive times without medical reason, if treatment goals not met, if there is a change in medical status, if patient makes no progress towards goals or if patient is discharged from hospital.  The above assessment, treatment plan, treatment alternatives and goals were discussed and mutually agreed upon: by patient  Ruthanna Macchia Rutherford Nail 10/22/2015, 2:53 PM

## 2015-10-22 NOTE — Progress Notes (Signed)
Social Work Assessment and Plan Social Work Assessment and Plan  Patient Details  Name: [name] MRN: [id number] Date of Birth: 08/25/1927  Today's Date: 10/22/2015  Problem List:  Patient Active Problem List   Diagnosis Date Noted  . Left pontine CVA ([place]) 10/21/2015  . Dysarthria, post-stroke   . Right hemiparesis ([place])   . Sinus arrest   . CKD (chronic kidney disease)   . DM type 2 without retinopathy ([place])   . Coronary artery disease involving native coronary artery of native heart without angina pectoris   . Acute idiopathic gout of right hand   . Aphasia   . Dysphagia, post-stroke   . HLD (hyperlipidemia)   . Essential hypertension   . Orthostatic hypotension   . 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[place]) 10/14/2015  . CVA (cerebral infarction) 10/14/2015  . Accelerated hypertension 10/14/2015  . Type 2 diabetes mellitus ([place]) 10/14/2015  . Peripheral vascular disease, unspecified ([place]) 06/04/2013   Past Medical History:  Past Medical History:  Diagnosis Date  . Adenomatous polyp   . CAD (coronary artery disease)   . Chronic kidney disease   . Diverticulosis   . DVT (deep venous thrombosis) ([place])   . Gout   . History of pulmonary embolus (PE)    from a DVT in 2008  . Hyperglycemia   . Hyperlipidemia   . Hypertension   . Mallory-Weiss tear   . Peripheral vascular disease ([place])   . SVT (supraventricular tachycardia) ([place])   . Vitamin B12 deficiency    Past Surgical History:  Past Surgical History:  Procedure Laterality Date  .  PTCA of coronary lesion Right 1995  . APPENDECTOMY    . PR VEIN BYPASS GRAFT,AORTO-FEM-POP Left    femoral-popliteal BP by Dr. Drucie Opitz  . repair of aal fissure   1978   Social History:  reports that he has never smoked. He has never used smokeless tobacco. He reports that he does not drink alcohol or use drugs.  Family / Support Systems Marital Status: Widow/Widower Patient Roles:  Parent Children: [facility] Jean-daughter [id number]  [id number]     Other Supports: Hortencia Conradi [phone]-cell Anticipated Caregiver: [name] and her husband Ability/Limitations of Caregiver: daughter is necessary can work from home and her husband is a Scientist, forensic and is on and out Caregiver Availability: 24/7 Family Dynamics: Close knit family his two daughter's are supporitve and involved. Both will assist if needed. Pt was very active and was assisting wioth the car eof his great grandaughter-6 yo. All will pull together to provide the care he needs at discharge.  Social History Preferred language: English Religion: Non-Denominational Cultural Background: No issues Education: 6th grade Read: Yes (limited) Write: No Employment Status: Retired Freight forwarder Issues: No issues Guardian/Conservator: none-according to MD pt is not capable of making his own decisions at this time. Will look toward his daughter's since there is no formal POA in place and they are next of kin.   Abuse/Neglect Physical Abuse: Denies Verbal Abuse: Denies Sexual Abuse: Denies Exploitation of patient/patient's resources: Denies Self-Neglect: Denies  Emotional Status Pt's affect, behavior adn adjustment status: Pt is motivated and wants to go home to his home alone from here. His daughter reports he is very independent and stubborn. He doesn't want to bother anyone. He will do what he can to achieve his goals. Recent Psychosocial Issues: other health issues but was able to be independent Pyschiatric History: No history he has been tearful  with staff when discussing his stroke. Feel he will benefit from seeing neuro-psych while here for coping. He has very high goals for himself and plans to reach them. Substance Abuse History: No issues  Patient / Family Perceptions, Expectations & Goals Pt/Family understanding of illness & functional limitations: Pt and daughter can explain his  stroke and deficits. Pt still questions why and the reason for. Both feel they have a good understanding of his stroke. Daughter does speak with MD and feels her concerns and questions are being addressed. Premorbid pt/family roles/activities: Father, Jon Gills, great grandfather, retiree, caregiver, church member, etc Anticipated changes in roles/activities/participation: resume Pt/family expectations/goals: Pt states: " I want to be at my home and take care of myself."  Daughter states: " I plan to have him come home with me and then go from there."  [employer]: None Premorbid Home Care/DME Agencies: None Transportation available at discharge: family Resource referrals recommended: Neuropsychology, Support group (specify)  Discharge Planning Living Arrangements: Alone Support Systems: Children, Other relatives, Water engineer, Social worker community Type of Residence: Private residence Insurance Resources: Multimedia programmer (specify) Primary school teacher) Financial Resources: Radio broadcast assistant Screen Referred: No Living Expenses: Own Money Management: Patient Does the patient have any problems obtaining your medications?: No Home Management: Patient Patient/Family Preliminary Plans: Plans to go to his daughter's Mickle Asper until he is able to return to his home. Daughter is aware he will need 24 hr supervision upon discharge from rehab. She is concerned that the insurance get approved since still being decidede upon. She is aware Genie is working on this and will get back to them. Social Work Anticipated Follow Up Needs: HH/OP, Support Group  Clinical Impression Pleasant gentleman who has high expectations of his rehab stay-wants to go home independently. Daughter's are involved and Karle Starch is planning on taking him to her home at discharge, until he is able to return home. Concern is rehab bill since pt's insurance has not been approved yet. Will  see if any other information they need. Daughter aware will need supervision level upon discharge. Will work on discharge plan and provide support to pt. Will make neuro-psych referral for assistance with coping, since has been crying with staff.  Elease Hashimoto 10/22/2015, 2:41 PM

## 2015-10-22 NOTE — Progress Notes (Signed)
Patient information reviewed and entered into eRehab system by Konnie Noffsinger, RN, CRRN, PPS Coordinator.  Information including medical coding and functional independence measure will be reviewed and updated through discharge.     Per nursing patient was given "Data Collection Information Summary for Patients in Inpatient Rehabilitation Facilities with attached "Privacy Act Statement-Health Care Records" upon admission.  

## 2015-10-23 ENCOUNTER — Inpatient Hospital Stay (HOSPITAL_COMMUNITY): Payer: Medicare Other

## 2015-10-23 ENCOUNTER — Inpatient Hospital Stay (HOSPITAL_COMMUNITY): Payer: Medicare Other | Admitting: Physical Therapy

## 2015-10-23 ENCOUNTER — Inpatient Hospital Stay (HOSPITAL_COMMUNITY): Payer: Medicare Other | Admitting: Speech Pathology

## 2015-10-23 LAB — GLUCOSE, CAPILLARY
GLUCOSE-CAPILLARY: 255 mg/dL — AB (ref 65–99)
GLUCOSE-CAPILLARY: 212 mg/dL — AB (ref 65–99)
Glucose-Capillary: 129 mg/dL — ABNORMAL HIGH (ref 65–99)

## 2015-10-23 MED ORDER — HYDRALAZINE HCL 25 MG PO TABS
25.0000 mg | ORAL_TABLET | Freq: Four times a day (QID) | ORAL | Status: DC
  Administered 2015-10-23 – 2015-10-24 (×4): 25 mg via ORAL
  Filled 2015-10-23 (×4): qty 1

## 2015-10-23 MED ORDER — CLONIDINE HCL 0.1 MG PO TABS
0.1000 mg | ORAL_TABLET | Freq: Three times a day (TID) | ORAL | Status: DC | PRN
  Administered 2015-10-23 – 2015-10-25 (×3): 0.1 mg via ORAL
  Filled 2015-10-23 (×3): qty 1

## 2015-10-23 NOTE — Progress Notes (Signed)
I have received a call from the expedited appeals department, not [place], with [name].  I have approval for acute inpatient rehab admission beginning today, 10/23/15.  I have informed the patient and his daughter that the first 2 days of inpatient rehab may be out of pocket expense and not covered by [employer].  Call me for questions.  [id number]

## 2015-10-23 NOTE — Progress Notes (Signed)
Occupational Therapy Note  Patient Details  Name: Izaiah Skates MRN: [id number] Date of Birth: 1927-04-08  Today's Date: 10/23/2015 OT Individual Time: 1330-1400 OT Individual Time Calculation (min): 30 min    Pt stated the pain in his right hand was better  Individual Therapy  Pt engaged in [place] with focus on isolated shoulder flexion and adduction.  Pt engaged in RUE weight bearing followed by use of RUE in functional tasks.  Pt continues to exhibit R shoulder elevation when reaching for objects with his RUE.  Pt is able to isolate movement with max multimodal cues.  Pt issued a half lap tray for positioning of RUE.  Pt returned to room with all needs within reach and daughter present.     Leotis Shames [facility] 10/23/2015, 2:36 PM

## 2015-10-23 NOTE — Progress Notes (Signed)
Speech Language Pathology Daily Session Note  Patient Details  Name: [name] MRN: [id number] Date of Birth: 1927-12-17  Today's Date: 10/23/2015 SLP Individual Time: 1104-1208 SLP Individual Time Calculation (min): 64 min   Short Term Goals: Week 1: SLP Short Term Goal 1 (Week 1): Pt will demonstrate efficient mastication and oral clearance of regular textures over 3 observed sessions to demonstrate readiness for diet advancement.  SLP Short Term Goal 2 (Week 1): Pt will demonstrate intellectual awareness within functional tasks with Mod A cues.  SLP Short Term Goal 3 (Week 1): Pt will utilize speech intelligibility strategies at the simple conversation level with Min A verbal cues. to achieve greater than 90% intelligibility.  SLP Short Term Goal 4 (Week 1): Pt will demonstrate functional problem solving for mildly complex tasks with Mod A verbal cues.   Skilled Therapeutic Interventions:  Pt was seen for skilled ST targeting goals for speech and dysphagia.  Pt consumed dys 3 textures and thin liquids with inconsistent complaints of globus sensation followed by coughing episodes during which pt expectorated masticated pieces of ham and cheese sandwich.  Suspect this to be consistent with baseline esophageal dysfunction given pt's reports.  Pt remains afebrile, O2 WFL on room air, lungs sounds documented to be clear to auscultation.  Recommend that pt remain on dys 3 textures and thin liquids.  Pt required mod assist verbal cues for slow rate and overarticulation to achieve intelligibility during functional conversations with therapist.  Pt with decreased awareness of impaired speech intelligibility with no attempts to self correct.  Pt was left in care of daughter while seated in wheelchair.  Continue per current plan of care.    Function:  Eating Eating   Modified Consistency Diet: Yes Eating Assist Level: Supervision or verbal cues           Cognition Comprehension Comprehension  assist level: Understands basic 90% of the time/cues < 10% of the time  Expression   Expression assist level: Expresses basic 75 - 89% of the time/requires cueing 10 - 24% of the time. Needs helper to occlude trach/needs to repeat words.  Social Interaction Social Interaction assist level: Interacts appropriately with others with medication or extra time (anti-anxiety, antidepressant).  Problem Solving Problem solving assist level: Solves basic 75 - 89% of the time/requires cueing 10 - 24% of the time  Memory Memory assist level: Recognizes or recalls 75 - 89% of the time/requires cueing 10 - 24% of the time    Pain Pain Assessment Pain Assessment: No/denies pain  Therapy/Group: Individual Therapy  Khristie Sak, Selinda Orion 10/23/2015, 4:04 PM

## 2015-10-23 NOTE — Progress Notes (Signed)
Occupational Therapy Session Note  Patient Details  Name: [name] MRN: [id number] Date of Birth: 03-20-1927  Today's Date: 10/23/2015 OT Individual Time: 0930-1030 OT Individual Time Calculation (min): 60 min     Short Term Goals: Week 1:  OT Short Term Goal 1 (Week 1): Pt will transfer to the elevated toilet with min assist using LRAD. OT Short Term Goal 2 (Week 1): Pt will complete UB dressing with supervision for pullover shirt. OT Short Term Goal 3 (Week 1): Pt will complete LB dressing with min assist using AE PRN. OT Short Term Goal 4 (Week 1): Pt will use the RUE for bathing tasks with no more than min assist.  OT Short Term Goal 5 (Week 1): Pt will complete all bathing with no more than min assist.    Skilled Therapeutic Interventions/Progress Updates:    Pt resting in bed upon arrival with daughter present.  Initial plan included bathing at shower level.  RN stated that pt's BP was elevated and she recommended completing BADLs at bed level and/or sitting EOB.  RN recommended no shower this morning.  Pt required max verbal cues for sequencing to sit EOB.  Pt required max verbal cues for sitting balance at EOB but did not required physical assistance.  Pt required mod verbal cues to initiate use of RUE for bathing/dressing tasks.  Pt required assistance to complete tasks when using his RUE.  Pt required min A for sit<>stand from EOB and steady A when standing with tactile cues to RLE to maintain standing balance.  Pt performed sit>supine and repositioning in bed at supervision level with min verbal cues for sequencing.  Pt remained in bed with daughter present and all needs within reach.  Therapy Documentation Precautions:  Precautions Precautions: Fall Precaution Comments: right hemiparesis with increased pain in the left wrist and hand secondary to gout Restrictions Weight Bearing Restrictions: No Pain: Pain Assessment Pain Assessment: 0-10 Pain Score: 0-No pain  See  Function Navigator for Current Functional Status.   Therapy/Group: Individual Therapy  Leroy Libman 10/23/2015, 11:16 AM

## 2015-10-23 NOTE — Progress Notes (Signed)
Physical Therapy Session Note  Patient Details  Name: [name] MRN: [id number] Date of Birth: 27-Jan-1928  Today's Date: 10/23/2015 PT Individual Time: 1415-1445 PT Individual Time Calculation (min): 30 min    Short Term Goals: Week 1:  PT Short Term Goal 1 (Week 1): pt will transfer with min assist consistently PT Short Term Goal 2 (Week 1): pt will propel w/c x 150' with supervision PT Short Term Goal 3 (Week 1): pt will perform gait x 150' with min assist, LRAD PT Short Term Goal 4 (Week 1): pt will ascend and descend 12 steps 2 rails, min assist   Therapy Documentation Precautions:  Precautions Precautions: Fall Precaution Comments: right hemiparesis with increased pain in the left wrist and hand secondary to gout Restrictions Weight Bearing Restrictions: No Vital Signs: Therapy Vitals Temp: 98.1 F (36.7 C) Temp Source: Axillary Pulse Rate: (!) 56 BP: (!) 159/57 Patient Position (if appropriate): Sitting Oxygen Therapy SpO2: 98 % O2 Device: Not Delivered Pain: Pain Assessment Pain Assessment: No/denies pain  Patient performed ambulatory transfers min assist  Patient ambulated 75 feet without assistive device mod assist. Patient demonstrates uneven step and stride length, right knee buckling, increased postural sway and a forward flexed posture. Tactile cues for weight shifting, upright posture and step length. Manual facilitation for increased weight bearing RLE.   Patient ambulated 15 feet  without assistive device max assist. Patient demonstrates uneven step and stride length, right knee buckling, increased postural sway and a forward flexed posture. Tactile cues for weight shifting, upright posture and step length. Manual facilitation for increased weight bearing RLE.    Pattmepted to retrieve item from floor however unable after mulitple attempts patient with continued loss of balance posteriorly.  Pateit tolerated session with frequent rest breaks throughout  session BP closely monitored and systolic ranged from 861-683 throughout session. Patient returned to room at end of session with all needs met and call bell within reach. Quick release belt engaged prior to exit.   See Function Navigator for Current Functional Status.   Therapy/Group: Individual Therapy  Retta Diones 10/23/2015, 3:35 PM

## 2015-10-23 NOTE — Progress Notes (Signed)
Subjective/Complaints: Right hand pain improving Discussed BPs with RN  ROS- denies CP,SOB, N/V/D  Objective: Vital Signs: Blood pressure (!) 191/64, pulse (!) 54, temperature 98.5 F (36.9 C), temperature source Oral, resp. rate 16, height 5' 8" (1.727 m), weight 85.1 kg (187 lb 9.8 oz), SpO2 99 %. Dg Esophagus  Result Date: 10/21/2015 CLINICAL DATA:  Dysphagia. EXAM: ESOPHOGRAM/BARIUM SWALLOW TECHNIQUE: Single contrast examination was performed using  thin barium. FLUOROSCOPY TIME:  Fluoroscopy Time:  0 minutes 42 seconds COMPARISON:  None. FINDINGS: Mucosa and motility of the esophagus are normal. No appreciable hiatal hernia, stricture, or mass. There was no aspiration with thin barium. IMPRESSION: Normal barium esophagram. Electronically Signed   By: Lorriane Shire M.D.   On: 10/21/2015 09:53   Results for orders placed or performed during the hospital encounter of 10/21/15 (from the past 72 hour(s))  CBC WITH DIFFERENTIAL     Status: Abnormal   Collection Time: 10/21/15  6:13 PM  Result Value Ref Range   WBC 9.4 4.0 - 10.5 K/uL   RBC 5.25 4.22 - 5.81 MIL/uL   Hemoglobin 15.9 13.0 - 17.0 g/dL   HCT 47.5 39.0 - 52.0 %   MCV 90.5 78.0 - 100.0 fL   MCH 30.3 26.0 - 34.0 pg   MCHC 33.5 30.0 - 36.0 g/dL   RDW 13.6 11.5 - 15.5 %   Platelets 155 150 - 400 K/uL   Neutrophils Relative % 91 %   Neutro Abs 8.5 (H) 1.7 - 7.7 K/uL   Lymphocytes Relative 6 %   Lymphs Abs 0.6 (L) 0.7 - 4.0 K/uL   Monocytes Relative 3 %   Monocytes Absolute 0.3 0.1 - 1.0 K/uL   Eosinophils Relative 0 %   Eosinophils Absolute 0.0 0.0 - 0.7 K/uL   Basophils Relative 0 %   Basophils Absolute 0.0 0.0 - 0.1 K/uL  Comprehensive metabolic panel     Status: Abnormal   Collection Time: 10/21/15  6:13 PM  Result Value Ref Range   Sodium 134 (L) 135 - 145 mmol/L   Potassium 4.2 3.5 - 5.1 mmol/L   Chloride 104 101 - 111 mmol/L   CO2 22 22 - 32 mmol/L   Glucose, Bld 233 (H) 65 - 99 mg/dL   BUN 18 6 - 20 mg/dL    Creatinine, Ser 1.44 (H) 0.61 - 1.24 mg/dL   Calcium 9.2 8.9 - 10.3 mg/dL   Total Protein 7.1 6.5 - 8.1 g/dL   Albumin 3.4 (L) 3.5 - 5.0 g/dL   AST 28 15 - 41 U/L   ALT 46 17 - 63 U/L   Alkaline Phosphatase 52 38 - 126 U/L   Total Bilirubin 0.8 0.3 - 1.2 mg/dL   GFR calc non Af Amer 42 (L) >60 mL/min   GFR calc Af Amer 49 (L) >60 mL/min    Comment: (NOTE) The eGFR has been calculated using the CKD EPI equation. This calculation has not been validated in all clinical situations. eGFR's persistently <60 mL/min signify possible Chronic Kidney Disease.    Anion gap 8 5 - 15  Glucose, capillary     Status: Abnormal   Collection Time: 10/21/15  8:36 PM  Result Value Ref Range   Glucose-Capillary 264 (H) 65 - 99 mg/dL  Glucose, capillary     Status: Abnormal   Collection Time: 10/22/15  9:23 AM  Result Value Ref Range   Glucose-Capillary 161 (H) 65 - 99 mg/dL  Glucose, capillary     Status:  Abnormal   Collection Time: 10/22/15 12:07 PM  Result Value Ref Range   Glucose-Capillary 174 (H) 65 - 99 mg/dL  Glucose, capillary     Status: Abnormal   Collection Time: 10/22/15  4:24 PM  Result Value Ref Range   Glucose-Capillary 282 (H) 65 - 99 mg/dL  Glucose, capillary     Status: Abnormal   Collection Time: 10/22/15  9:06 PM  Result Value Ref Range   Glucose-Capillary 190 (H) 65 - 99 mg/dL  Glucose, capillary     Status: Abnormal   Collection Time: 10/23/15  6:40 AM  Result Value Ref Range   Glucose-Capillary 129 (H) 65 - 99 mg/dL     HEENT: normal Cardio: RRR and no murmur Resp: CTA B/L and unlabored GI: BS positive and NT, ND Extremity:  Pulses positive and No Edema Skin:   Intact Neuro: Alert/Oriented, Normal Sensory, Abnormal Motor 3- R delt, bi, tri, grip, 4/5 R HF, KE , ADF, Abnormal FMC Ataxic/ dec FMC and Dysarthric Musc/Skel:  Other Erythema , tenderness and sweling R MCP 2 and 3 Gen NAD   Assessment/Plan: 1. Functional deficits secondary to Left pontine infarct  which require 3+ hours per day of interdisciplinary therapy in a comprehensive inpatient rehab setting. Physiatrist is providing close team supervision and 24 hour management of active medical problems listed below. Physiatrist and rehab team continue to assess barriers to discharge/monitor patient progress toward functional and medical goals. FIM: Function - Bathing Position: Wheelchair/chair at sink Body parts bathed by patient: Right arm, Chest, Abdomen, Right upper leg, Left upper leg Body parts bathed by helper: Right lower leg, Left lower leg, Buttocks, Front perineal area, Left arm  Function- Upper Body Dressing/Undressing What is the patient wearing?: Pull over shirt/dress Pull over shirt/dress - Perfomed by patient: Thread/unthread left sleeve Pull over shirt/dress - Perfomed by helper: Thread/unthread right sleeve, Put head through opening, Pull shirt over trunk Function - Lower Body Dressing/Undressing What is the patient wearing?: Pants, Non-skid slipper socks Position: Wheelchair/chair at sink Pants- Performed by patient: Thread/unthread right pants leg, Thread/unthread left pants leg Pants- Performed by helper: Pull pants up/down Non-skid slipper socks- Performed by helper: Don/doff right sock, Don/doff left sock        Function - Chair/bed transfer Chair/bed transfer method: Stand pivot Chair/bed transfer assist level: Moderate assist (Pt 50 - 74%/lift or lower) Chair/bed transfer assistive device: Armrests Chair/bed transfer details: Manual facilitation for weight shifting, Verbal cues for precautions/safety, Verbal cues for technique, Verbal cues for sequencing  Function - Locomotion: Wheelchair Will patient use wheelchair at discharge?: Yes Type: Manual Max wheelchair distance: 150 Assist Level: Touching or steadying assistance (Pt > 75%) Assist Level: Touching or steadying assistance (Pt > 75%) Assist Level: Touching or steadying assistance (Pt > 75%) Turns  around,maneuvers to table,bed, and toilet,negotiates 3% grade,maneuvers on rugs and over doorsills: No Function - Locomotion: Ambulation Assistive device: No device Max distance: 25 Assist level: Moderate assist (Pt 50 - 74%) Assist level: Moderate assist (Pt 50 - 74%) Walk 50 feet with 2 turns activity did not occur: Safety/medical concerns Walk 150 feet activity did not occur: Safety/medical concerns Walk 10 feet on uneven surfaces activity did not occur: Safety/medical concerns  Function - Comprehension Comprehension: Auditory Comprehension assist level: Understands basic 90% of the time/cues < 10% of the time  Function - Expression Expression: Verbal Expression assist level: Expresses basic 75 - 89% of the time/requires cueing 10 - 24% of the time. Needs helper to occlude trach/needs  to repeat words.  Function - Social Interaction Social Interaction assist level: Interacts appropriately with others with medication or extra time (anti-anxiety, antidepressant).  Function - Problem Solving Problem solving assist level: Solves basic 50 - 74% of the time/requires cueing 25 - 49% of the time (Pt has 6th grade education)  Function - Memory Memory assist level: Recognizes or recalls 75 - 89% of the time/requires cueing 10 - 24% of the time Patient normally able to recall (first 3 days only): Current season, That he or she is in a hospital  Medical Problem List and Plan:   1.  Right hemiparesis dysarthria/dysphagia secondary to left pontine infarct, initiate CIR, PT, OT, SLP 2.  DVT Prophylaxis/Anticoagulation: Subcutaneous heparin. Monitor platelet count and any signs of bleeding 3. Pain Management: Tylenol as needed 4. Hypertension. Lopressor discontinued due to bradycardia.Increase hydralazine 25 mg every 6 hours,  Monitor with increased mobility, add clonidine .26m prn systolic >>[id number]. Sinus arrest due to vagal spell. Beta blocker has been discontinued for now. No additional  episodes. Follow-up per cardiology services. No plan at this time for pacemaker. 6. Neuropsych: This patient is not fully capable of making decisions on his own behalf. 7. Skin/Wound Care: Routine skin check 8. Fluids/Electrolytes/Nutrition: Routine I&O's with follow-up chemistries 9. Chronic renal insufficiency. Baseline creatinine 1.5. Follow-up chemistries 10. Diet-controlled diabetes mellitus. Hemoglobin A1c 7.3. Check blood sugars before meals and at bedtime. Diabetic teaching.Monitor as activity increased CBG (last 3)   Recent Labs  10/22/15 1624 10/22/15 2106 10/23/15 0640  GLUCAP 282* 190* 129*     11. CAD/SVT. No chest pain or shortness of breath.  12. Gout. Placed on colchicine 1 dose as well as prednisone 40 mg daily improving  13. Hyperlipidemia. Pravachol   LOS (Days) 2 A FACE TO FACE EVALUATION WAS PERFORMED  KIRSTEINS,ANDREW E 10/23/2015, 8:04 AM

## 2015-10-24 ENCOUNTER — Inpatient Hospital Stay (HOSPITAL_COMMUNITY): Payer: Medicare Other | Admitting: Speech Pathology

## 2015-10-24 ENCOUNTER — Inpatient Hospital Stay (HOSPITAL_COMMUNITY): Payer: Medicare Other

## 2015-10-24 ENCOUNTER — Inpatient Hospital Stay (HOSPITAL_COMMUNITY): Payer: Medicare Other | Admitting: Physical Therapy

## 2015-10-24 DIAGNOSIS — I10 Essential (primary) hypertension: Secondary | ICD-10-CM

## 2015-10-24 DIAGNOSIS — M10041 Idiopathic gout, right hand: Secondary | ICD-10-CM

## 2015-10-24 DIAGNOSIS — R739 Hyperglycemia, unspecified: Secondary | ICD-10-CM

## 2015-10-24 DIAGNOSIS — E118 Type 2 diabetes mellitus with unspecified complications: Secondary | ICD-10-CM

## 2015-10-24 DIAGNOSIS — T380X5A Adverse effect of glucocorticoids and synthetic analogues, initial encounter: Secondary | ICD-10-CM

## 2015-10-24 LAB — GLUCOSE, CAPILLARY
GLUCOSE-CAPILLARY: 183 mg/dL — AB (ref 65–99)
GLUCOSE-CAPILLARY: 221 mg/dL — AB (ref 65–99)
Glucose-Capillary: 124 mg/dL — ABNORMAL HIGH (ref 65–99)
Glucose-Capillary: 167 mg/dL — ABNORMAL HIGH (ref 65–99)

## 2015-10-24 MED ORDER — GLIMEPIRIDE 2 MG PO TABS: 2.0000 mg | ORAL_TABLET | Freq: Every day | ORAL | Status: DC

## 2015-10-24 MED ORDER — AMLODIPINE BESYLATE 5 MG PO TABS
5.0000 mg | ORAL_TABLET | Freq: Every day | ORAL | Status: DC
  Administered 2015-10-24 – 2015-10-31 (×8): 5 mg via ORAL
  Filled 2015-10-24 (×8): qty 1

## 2015-10-24 MED ORDER — HYDRALAZINE HCL 50 MG PO TABS
50.0000 mg | ORAL_TABLET | Freq: Four times a day (QID) | ORAL | Status: DC
  Administered 2015-10-24 – 2015-10-25 (×4): 50 mg via ORAL
  Filled 2015-10-24 (×4): qty 1

## 2015-10-24 MED ORDER — ENOXAPARIN SODIUM 40 MG/0.4ML ~~LOC~~ SOLN
40.0000 mg | SUBCUTANEOUS | Status: DC
  Administered 2015-10-24 – 2015-11-10 (×17): 40 mg via SUBCUTANEOUS
  Filled 2015-10-24 (×17): qty 0.4

## 2015-10-24 NOTE — Progress Notes (Signed)
Physical Therapy Session Note  Patient Details  Name: [name] MRN: [id number] Date of Birth: 1927/05/18  Today's Date: 10/24/2015 PT Individual Time: [id number] PT Individual Time Calculation (min): 60 min    Short Term Goals: Week 1:  PT Short Term Goal 1 (Week 1): pt will transfer with min assist consistently PT Short Term Goal 2 (Week 1): pt will propel w/c x 150' with supervision PT Short Term Goal 3 (Week 1): pt will perform gait x 150' with min assist, LRAD PT Short Term Goal 4 (Week 1): pt will ascend and descend 12 steps 2 rails, min assist  Skilled Therapeutic Interventions/Progress Updates:    Pt was seen bedside in the am. Pt propelled w/c to gym with L UE and LE about 200 feet with S and increased time with verbal cues. Pt performed multiple sit to stand transfers with and without [facility] and min A with verbal cues. Pt ambulated 50 feet without assistive device and mod A with verbal cues. Pt ambulated 50 and 150 feet with WBQC and min A with verbal cues. Treatment focused on NMR utilizing step taps 3 sets x 10 reps each. Pt propelled w/c back to room about 200 feet with B LEs and S with occasional verbal cues and increased time. Pt left sitting up in w/c with quick release belt in place and call bell within reach.   Therapy Documentation Precautions:  Precautions Precautions: Fall Precaution Comments: right hemiparesis with increased pain in the left wrist and hand secondary to gout Restrictions Weight Bearing Restrictions: No General:   Pain: No c/o pain.   See Function Navigator for Current Functional Status.   Therapy/Group: Individual Therapy  Dub Amis 10/24/2015, 12:15 PM

## 2015-10-24 NOTE — Progress Notes (Signed)
Subjective/Complaints: Pt seen laying in bed.  He would like to know his prognosis.  ROS- denies CP,SOB, N/V/D  Objective: Vital Signs: Blood pressure (!) 160/78, pulse 63, temperature 97.7 F (36.5 C), temperature source Oral, resp. rate 16, height _0  (1.727 m), weight 85.1 kg (187 lb 9.8 oz), SpO2 98 %. No results found. Results for orders placed or performed during the hospital encounter of 10/21/15 (from the past 72 hour(s))  CBC WITH DIFFERENTIAL     Status: Abnormal   Collection Time: 10/21/15  6:13 PM  Result Value Ref Range   WBC 9.4 4.0 - 10.5 K/uL   RBC 5.25 4.22 - 5.81 MIL/uL   Hemoglobin 15.9 13.0 - 17.0 g/dL   HCT 47.5 39.0 - 52.0 %   MCV 90.5 78.0 - 100.0 fL   MCH 30.3 26.0 - 34.0 pg   MCHC 33.5 30.0 - 36.0 g/dL   RDW 13.6 11.5 - 15.5 %   Platelets 155 150 - 400 K/uL   Neutrophils Relative % 91 %   Neutro Abs 8.5 (H) 1.7 - 7.7 K/uL   Lymphocytes Relative 6 %   Lymphs Abs 0.6 (L) 0.7 - 4.0 K/uL   Monocytes Relative 3 %   Monocytes Absolute 0.3 0.1 - 1.0 K/uL   Eosinophils Relative 0 %   Eosinophils Absolute 0.0 0.0 - 0.7 K/uL   Basophils Relative 0 %   Basophils Absolute 0.0 0.0 - 0.1 K/uL  Comprehensive metabolic panel     Status: Abnormal   Collection Time: 10/21/15  6:13 PM  Result Value Ref Range   Sodium 134 (L) 135 - 145 mmol/L   Potassium 4.2 3.5 - 5.1 mmol/L   Chloride 104 101 - 111 mmol/L   CO2 22 22 - 32 mmol/L   Glucose, Bld 233 (H) 65 - 99 mg/dL   BUN 18 6 - 20 mg/dL   Creatinine, Ser 1.44 (H) 0.61 - 1.24 mg/dL   Calcium 9.2 8.9 - 10.3 mg/dL   Total Protein 7.1 6.5 - 8.1 g/dL   Albumin 3.4 (L) 3.5 - 5.0 g/dL   AST 28 15 - 41 U/L   ALT 46 17 - 63 U/L   Alkaline Phosphatase 52 38 - 126 U/L   Total Bilirubin 0.8 0.3 - 1.2 mg/dL   GFR calc non Af Amer 42 (L) >60 mL/min   GFR calc Af Amer 49 (L) >60 mL/min    Comment: (NOTE) The eGFR has been calculated using the CKD EPI equation. This calculation has not been validated in all clinical  situations. eGFR's persistently <60 mL/min signify possible Chronic Kidney Disease.    Anion gap 8 5 - 15  Glucose, capillary     Status: Abnormal   Collection Time: 10/21/15  8:36 PM  Result Value Ref Range   Glucose-Capillary 264 (H) 65 - 99 mg/dL  Glucose, capillary     Status: Abnormal   Collection Time: 10/22/15  9:23 AM  Result Value Ref Range   Glucose-Capillary 161 (H) 65 - 99 mg/dL  Glucose, capillary     Status: Abnormal   Collection Time: 10/22/15 12:07 PM  Result Value Ref Range   Glucose-Capillary 174 (H) 65 - 99 mg/dL  Glucose, capillary     Status: Abnormal   Collection Time: 10/22/15  4:24 PM  Result Value Ref Range   Glucose-Capillary 282 (H) 65 - 99 mg/dL  Glucose, capillary     Status: Abnormal   Collection Time: 10/22/15  9:06 PM  Result  Value Ref Range   Glucose-Capillary 190 (H) 65 - 99 mg/dL  Glucose, capillary     Status: Abnormal   Collection Time: 10/23/15  6:40 AM  Result Value Ref Range   Glucose-Capillary 129 (H) 65 - 99 mg/dL  Glucose, capillary     Status: Abnormal   Collection Time: 10/23/15  4:43 PM  Result Value Ref Range   Glucose-Capillary 255 (H) 65 - 99 mg/dL  Glucose, capillary     Status: Abnormal   Collection Time: 10/23/15  9:19 PM  Result Value Ref Range   Glucose-Capillary 212 (H) 65 - 99 mg/dL  Glucose, capillary     Status: Abnormal   Collection Time: 10/24/15  6:56 AM  Result Value Ref Range   Glucose-Capillary 124 (H) 65 - 99 mg/dL  Glucose, capillary     Status: Abnormal   Collection Time: 10/24/15  9:36 AM  Result Value Ref Range   Glucose-Capillary 167 (H) 65 - 99 mg/dL     HEENT: normocephalic, atraumatic.  Cardio: RRR and no murmur Resp: CTA B/L and unlabored GI: BS positive and NT, ND Skin:   Intact. Warm and dry. Neuro: Alert/Oriented Motor 4/5 R delt, bi, tri, grip. Apraxia 4/5 R HF, KE , ADF,  Dysarthria Musc/Skel:  Other Erythema , tenderness and sweling R MCP 2 and 3 (improving) Gen NAD. Vitals signs  reviewed.    Assessment/Plan: 1. Functional deficits secondary to Left pontine infarct which require 3+ hours per day of interdisciplinary therapy in a comprehensive inpatient rehab setting. Physiatrist is providing close team supervision and 24 hour management of active medical problems listed below. Physiatrist and rehab team continue to assess barriers to discharge/monitor patient progress toward functional and medical goals. FIM: Function - Bathing Position: Shower Body parts bathed by patient: Right arm, Chest, Abdomen, Front perineal area, Buttocks, Right upper leg, Left upper leg, Left lower leg Body parts bathed by helper: Left arm, Right lower leg, Back Assist Level: Touching or steadying assistance(Pt > 75%)  Function- Upper Body Dressing/Undressing What is the patient wearing?: Pull over shirt/dress Pull over shirt/dress - Perfomed by patient: Thread/unthread left sleeve, Put head through opening, Pull shirt over trunk Pull over shirt/dress - Perfomed by helper: Pull shirt over trunk, Thread/unthread right sleeve Assist Level: Touching or steadying assistance(Pt > 75%) Function - Lower Body Dressing/Undressing What is the patient wearing?: Non-skid slipper socks, Ted Hose, Underwear, Pants Position: Wheelchair/chair at [employer] - Performed by patient: Thread/unthread left underwear leg, Pull underwear up/down Underwear - Performed by helper: Thread/unthread right underwear leg Pants- Performed by patient: Thread/unthread left pants leg, Pull pants up/down Pants- Performed by helper: Thread/unthread right pants leg Non-skid slipper socks- Performed by helper: Don/doff right sock, Don/doff left sock TED Hose - Performed by helper: Don/doff right TED hose, Don/doff left TED hose Assist for footwear: Dependant        Function - Chair/bed transfer Chair/bed transfer method: Stand pivot, Ambulatory Chair/bed transfer assist level: Moderate assist (Pt 50 - 74%/lift or  lower) Chair/bed transfer assistive device: Armrests Chair/bed transfer details: Manual facilitation for weight shifting, Verbal cues for precautions/safety, Verbal cues for technique, Verbal cues for sequencing  Function - Locomotion: Wheelchair Will patient use wheelchair at discharge?: Yes Type: Manual Max wheelchair distance: 150 Assist Level: Touching or steadying assistance (Pt > 75%) Assist Level: Touching or steadying assistance (Pt > 75%) Assist Level: Touching or steadying assistance (Pt > 75%) Turns around,maneuvers to table,bed, and toilet,negotiates 3% grade,maneuvers on rugs and over doorsills:  No Function - Locomotion: Ambulation Assistive device: No device Max distance: 75 Assist level: Moderate assist (Pt 50 - 74%) Assist level: Moderate assist (Pt 50 - 74%) Walk 50 feet with 2 turns activity did not occur: Safety/medical concerns Assist level: Moderate assist (Pt 50 - 74%) Walk 150 feet activity did not occur: Safety/medical concerns Walk 10 feet on uneven surfaces activity did not occur: Safety/medical concerns Assist level: Maximal assist (Pt 25 - 49%)  Function - Comprehension Comprehension: Auditory Comprehension assist level: Understands basic 90% of the time/cues < 10% of the time  Function - Expression Expression: Verbal Expression assist level: Expresses basic 50 - 74% of the time/requires cueing 25 - 49% of the time. Needs to repeat parts of sentences.  Function - Social Interaction Social Interaction assist level: Interacts appropriately 90% of the time - Needs monitoring or encouragement for participation or interaction.  Function - Problem Solving Problem solving assist level: Solves basic 75 - 89% of the time/requires cueing 10 - 24% of the time  Function - Memory Memory assist level: Recognizes or recalls 75 - 89% of the time/requires cueing 10 - 24% of the time Patient normally able to recall (first 3 days only): Current season, That he or she  is in a hospital  Medical Problem List and Plan:   1.  Right hemiparesis dysarthria/dysphagia secondary to left pontine infarct on 10/14/15  Cont CIR 2.  DVT Prophylaxis/Anticoagulation: Subcutaneous heparin changed to lovenox. Monitor platelet count and any signs of bleeding 3. Pain Management: Tylenol as needed 4. Hypertension.   Lopressor discontinued due to bradycardia.  Increased hydralazine 50 mg every 6 hours on 8/26    Norvasc 5 started 8/26  Will need to monitor closely, once off steroids  Monitor with increased mobility, added clonidine .30m prn systolic >>[id number]. Sinus arrest due to vagal spell. Beta blocker has been discontinued for now. No additional episodes. Follow-up per cardiology services. No plan at this time for pacemaker. 6. Neuropsych: This patient is not fully capable of making decisions on his own behalf. 7. Skin/Wound Care: Routine skin check 8. Fluids/Electrolytes/Nutrition: Routine I&O's with follow-up chemistries 9. Chronic renal insufficiency. Baseline creatinine 1.5. Follow-up chemistries 10. Diet-controlled diabetes mellitus. Hemoglobin A1c 7.3. Check blood sugars before meals and at bedtime. Diabetic teaching.  Monitor as activity increased  Will consider scheduling medications if needed after steroids complete CBG (last 3)   Recent Labs  10/23/15 2119 10/24/15 0656 10/24/15 0936  GLUCAP 212* 124* 167*  11. CAD/SVT. No chest pain or shortness of breath.  12. Gout. Placed on colchicine 1 dose as well as prednisone 40 mg daily improving   13. Hyperlipidemia. Pravachol  LOS (Days) 3 A FACE TO FACE EVALUATION WAS PERFORMED  Loletta Harper ALorie Phenix8/26/2017, 11:42 AM

## 2015-10-24 NOTE — Progress Notes (Signed)
Occupational Therapy Note  Patient Details  Name: [name] MRN: [id number] Date of Birth: 1927-10-04  Today's Date: 10/24/2015 OT Individual Time: 1500-1530 OT Individual Time Calculation (min): 30 min    Pt denied pain Individual therapy  Pt resting in bed upon arrival and agreeable to participating in therapy.  Pt engaged in dynamic sitting tasks using RUE to grasp objects and place them on a surface on his left.  Pt engaged in grasping objects with RUE positioned on his left and placing them behind him on his right to encourage increase trunk mobility.  Pt also engaged is squats from mat without BUE use.  Pt performed stand pivot transfers with steady A.  Pt returned to room and remained in w/c with QRB and half lap tray in place.  Leotis Shames [facility] 10/24/2015, 3:35 PM

## 2015-10-24 NOTE — IPOC Note (Signed)
Overall Plan of Care [facility]) Patient Details Name: [name] MRN: [id number] DOB: Jun 11, 1927  Admitting Diagnosis: CVA  Hospital Problems: Active Problems:   Left pontine CVA ([place])   Steroid-induced hyperglycemia     Functional Problem List: Nursing Bladder, Bowel, Edema, Endurance, Medication Management, Nutrition, Pain, Safety, Skin Integrity  PT Balance, Edema, Endurance, Motor, Pain, Safety  OT Balance, Cognition, Endurance, Motor, Pain, Safety, Sensory  SLP Cognition, Safety  TR         Basic ADL's: OT Eating, Grooming, Bathing, Toileting     Advanced  ADL's: OT       Transfers: PT Bed Mobility, Bed to Chair, Car, Patent attorney, Agricultural engineer: PT Ambulation, Emergency planning/management officer, Stairs     Additional Impairments: OT Fuctional Use of Upper Extremity  SLP Swallowing, Communication expression    TR      Anticipated Outcomes Item Anticipated Outcome  Self Feeding modified independent  Swallowing  Mod I   Basic self-care  supervision   Toileting  supervision   Bathroom Transfers supervision  Bowel/Bladder  Min assistance  Transfers  supervision basic and car  Locomotion  supervision gait x 150' in all environements, and up/down 12 steps 2 rails; supervision w/c x 150' in controlled environment and community environment; supervsion w/c x 50' in home env  Communication  Mod I  Cognition  Min A  Pain  <3 with supervision  Safety/Judgment  Min assistance   Therapy Plan: PT Intensity: Minimum of 1-2 x/day ,45 to 90 minutes PT Frequency: 5 out of 7 days PT Duration Estimated Length of Stay: 14 days OT Intensity: Minimum of 1-2 x/day, 45 to 90 minutes OT Frequency: 5 out of 7 days OT Duration/Estimated Length of Stay: 14-16 days SLP Intensity: Minumum of 1-2 x/day, 30 to 90 minutes SLP Frequency: 3 to 5 out of 7 days SLP Duration/Estimated Length of Stay: 14 days       Team Interventions: Nursing Interventions Patient/Family  Education, Disease Management/Prevention, Pain Management, Medication Management, Skin Care/Wound Management, Psychosocial Support, Discharge Planning  PT interventions Ambulation/gait training, Balance/vestibular training, Cognitive remediation/compensation, Discharge planning, Community reintegration, DME/adaptive equipment instruction, Functional mobility training, Patient/family education, Pain management, Neuromuscular re-education, Psychosocial support, Splinting/orthotics, Therapeutic Exercise, Therapeutic Activities, Stair training, UE/LE Strength taining/ROM, UE/LE Coordination activities, Wheelchair propulsion/positioning  OT Interventions Training and development officer, Cognitive remediation/compensation, Academic librarian, Engineer, drilling, Disease mangement/prevention, Discharge planning, Functional electrical stimulation, Functional mobility training, Neuromuscular re-education, Patient/family education, Pain management, Self Care/advanced ADL retraining, Therapeutic Activities, UE/LE Coordination activities, Therapeutic Exercise, UE/LE Strength taining/ROM, Visual/perceptual remediation/compensation, Splinting/orthotics, Wheelchair propulsion/positioning  SLP Interventions Cognitive remediation/compensation, Cueing hierarchy, Dysphagia/aspiration precaution training, Internal/external aids, Functional tasks, Patient/family education, Therapeutic Exercise, Therapeutic Activities, Oral motor exercises, Speech/Language facilitation  TR Interventions    SW/CM Interventions Discharge Planning, Psychosocial Support, Patient/Family Education    Team Discharge Planning: Destination: PT-Home (dtr's home) ,OT- Home , SLP-Home Projected Follow-up: PT-Home health PT, OT-  Home health OT, 24 hour supervision/assistance, SLP-Home Health SLP Projected Equipment Needs: PT-To be determined, OT- To be determined, SLP-None recommended by SLP Equipment Details: PT- , OT-  Patient/family  involved in discharge planning: PT- Patient,  OT-Patient, SLP-Patient  MD ELOS: 12-16 days. Medical Rehab Prognosis:  Good Assessment: 82 y.o.malewith history of Gout,CADMaintained on aspirin, SVT, CKDwith creatinine 1.50, T2DM PVD with RLE claudicationwho was admitted on 10/14/15 with difficulty talking, difficulty moving RLE and difficulty with RUE coordination.Patient lives alone independently using a walker prior to admission.Patient had stopped taking his ASA  8/13 in anticipation of upcoming dental procedure. UDS negative MRI brain/CT angiogram of head and neckdone revealing acute left pontine infarct, generalized atrophy, moderate narrowing midportion of basilar artery and multifocal moderate to severe narrowing of intracranial internal carotid arteries L >R.Carotid dopplers without significant stenosis. Cardiac echo with EF [id number], normal systolic function and mild MVR/AVR. [name] recommended increasing ASA 325 mg with plavix X 3 months followed by plavix alone due to BA stenosis. BP goal 130 -150 due to BA stenosis. On the afternoon of 10/20/2015 after arrangements being made to be admitted to CIR patient while sitting up in chair became unresponsive with heart rate in the 40s. Patient had received metoprolol at morning. EKG showed prolongation of the P-P interval with resultant significant pauseand heart rate 56. Rapid response contacted. He was transferred to ICU and cardiology consulted. It was felt asystole likely vagally mediated and no acute indication for pacemaker and cardiology signed off as patient had no additional episodes. Right hand and wrist swollen tender suspect flareup of gout as well as right knee with colchicine and prednisone initiated 10/21/2015. Tolerating a mechanical soft diet. Pt with resulting deficits with mobility and ability to complete ADLs secondary to stroke and confounded by pain from gout.  Will set goals for Supervision with PT/OT and Min A with  SLP.   See Team Conference Notes for weekly updates to the plan of care

## 2015-10-24 NOTE — Progress Notes (Signed)
Speech Language Pathology Daily Session Note  Patient Details  Name: [name] MRN: [id number] Date of Birth: 1927/08/17  Today's Date: 10/24/2015 SLP Individual Time: 1048-1130 SLP Individual Time Calculation (min): 42 min   Short Term Goals: Week 1: SLP Short Term Goal 1 (Week 1): Pt will demonstrate efficient mastication and oral clearance of regular textures over 3 observed sessions to demonstrate readiness for diet advancement.  SLP Short Term Goal 2 (Week 1): Pt will demonstrate intellectual awareness within functional tasks with Mod A cues.  SLP Short Term Goal 3 (Week 1): Pt will utilize speech intelligibility strategies at the simple conversation level with Min A verbal cues. to achieve greater than 90% intelligibility.  SLP Short Term Goal 4 (Week 1): Pt will demonstrate functional problem solving for mildly complex tasks with Mod A verbal cues.   Skilled Therapeutic Interventions: Pt was seen for skilled ST targeting goals for dysphagia and communication.  SLP facilitated the session with a functional snack of graham crackers, peanut butter, and coffee for ongoing diagnostic treatment of dysphagia.  Pt with only intermittent complaints of globus sensation without overt s/s of aspiration.  Overall presentation much improved today in comparison to yesterday's therapy session.  Pt continues endorse difficulty coming up with the words he wants to say.  No word finding difficulty noted during functional conversations with therapist; however, pt demonstrated x1 instance of initial part word repetition/groping for word which cleared with mod I.  Suspect episode related to coordination of articulation in the presence of oral motor weakness.  Primary communication impairment continues to be decreased speech intelligibility resulting from imprecise articulation of consonants and rapid rate of speech.  Min assist needed today for slow rate to achieve intelligibility in conversations.  Pt was returned  to room and left with call bell within reach with quick release belt donned while in wheelchair.  Continue per current plan of care.    Function:  Eating Eating   Modified Consistency Diet: Yes Eating Assist Level: Supervision or verbal cues           Cognition Comprehension Comprehension assist level: Understands basic 90% of the time/cues < 10% of the time  Expression   Expression assist level: Expresses basic 50 - 74% of the time/requires cueing 25 - 49% of the time. Needs to repeat parts of sentences.  Social Interaction Social Interaction assist level: Interacts appropriately 90% of the time - Needs monitoring or encouragement for participation or interaction.  Problem Solving Problem solving assist level: Solves basic 75 - 89% of the time/requires cueing 10 - 24% of the time  Memory Memory assist level: Recognizes or recalls 75 - 89% of the time/requires cueing 10 - 24% of the time    Pain Pain Assessment Pain Assessment: No/denies pain  Therapy/Group: Individual Therapy  Adryana Mogensen, Selinda Orion 10/24/2015, 11:31 AM

## 2015-10-24 NOTE — Progress Notes (Signed)
Occupational Therapy Session Note  Patient Details  Name: [name] MRN: [id number] Date of Birth: 1927/12/05  Today's Date: 10/24/2015 OT Individual Time: [id number] OT Individual Time Calculation (min): 58 min     Short Term Goals: Week 1:  OT Short Term Goal 1 (Week 1): Pt will transfer to the elevated toilet with min assist using LRAD. OT Short Term Goal 2 (Week 1): Pt will complete UB dressing with supervision for pullover shirt. OT Short Term Goal 3 (Week 1): Pt will complete LB dressing with min assist using AE PRN. OT Short Term Goal 4 (Week 1): Pt will use the RUE for bathing tasks with no more than min assist.  OT Short Term Goal 5 (Week 1): Pt will complete all bathing with no more than min assist.    Skilled Therapeutic Interventions/Progress Updates:    Pt resting in bed upon arrival with breakfast tray in front.  Pt had only eaten a small amount of his cereal, stating he continued to have trouble with his swallowing.  RN aware.  Pt engaged in BADL retraining including bathing at shower level and dressing with sit<>stand from w/c at sink.  Pt transferred w/c>tub bench in shower with min A and max verbal cues for sequencing.  Pt stood in shower using grab bars and holding onto grab bar with his R hand to enable bathing buttocks with L hand.  Pt required max verbal cues to straighten his RLE when standing.  Pt exhibits tendency to collapse onto his RLE when engaged in functional tasks but is able to correct with verbal cues and tactile cues.  Pt required max verbal cues for hemi dressing techniques.  Pt states that he is having difficulty concentrating and that is frustrating.  Pt remained in w/c with QRB and half lap tray in place and all needs within reach.  Focus on functional transfers, sit<>stand, standing balance, sequencing, RUE use, and safety awareness to increase independence with BADLs.  Therapy Documentation Precautions:  Precautions Precautions: Fall Precaution  Comments: right hemiparesis with increased pain in the left wrist and hand secondary to gout Restrictions Weight Bearing Restrictions: No Pain:  Pt denied pain  See Function Navigator for Current Functional Status.   Therapy/Group: Individual Therapy  Leroy Libman 10/24/2015, 8:58 AM

## 2015-10-24 NOTE — Progress Notes (Deleted)
ANTIBIOTIC CONSULT NOTE - INITIAL  Pharmacy Consult for enoxaparin Indication:VTE prophylaxis   Allergies  Allergen Reactions  . Lipitor [Atorvastatin]     Muscle pain   . Zetia [Ezetimibe] Hives    Patient Measurements: Height: 5\' 8"  (172.7 cm) Weight: 187 lb 9.8 oz (85.1 kg) IBW/kg (Calculated) : 68.4  Vital Signs: Temp: 97.7 F (36.5 C) (08/26 0629) Temp Source: Oral (08/26 0629) BP: 160/78 (08/26 0830) Pulse Rate: 63 (08/26 0830) Intake/Output from previous day: 08/25 0701 - 08/26 0700 In: 480 [P.O.:480] Out: 300 [Urine:300] Intake/Output from this shift: Total I/O In: 240 [P.O.:240] Out: 600 [Urine:600]  Labs:  Recent Labs  10/21/15 1813  WBC 9.4  HGB 15.9  PLT 155  CREATININE 1.44*   Estimated Creatinine Clearance: 39.1 mL/min (by C-G formula based on SCr of 1.44 mg/dL). No results for input(s): VANCOTROUGH, VANCOPEAK, VANCORANDOM, GENTTROUGH, GENTPEAK, GENTRANDOM, TOBRATROUGH, TOBRAPEAK, TOBRARND, AMIKACINPEAK, AMIKACINTROU, AMIKACIN in the last 72 hours.   Microbiology: Recent Results (from the past 720 hour(s))  MRSA PCR Screening     Status: None   Collection Time: 10/20/15 11:59 AM  Result Value Ref Range Status   MRSA by PCR NEGATIVE NEGATIVE Final    Comment:        The GeneXpert MRSA Assay (FDA approved for NASAL specimens only), is one component of a comprehensive MRSA colonization surveillance program. It is not intended to diagnose MRSA infection nor to guide or monitor treatment for MRSA infections.     Medical History: Past Medical History:  Diagnosis Date  . Adenomatous polyp   . CAD (coronary artery disease)   . Chronic kidney disease   . Diverticulosis   . DVT (deep venous thrombosis) ([place])   . Gout   . History of pulmonary embolus (PE)    from a DVT in 2008  . Hyperglycemia   . Hyperlipidemia   . Hypertension   . Mallory-Weiss tear   . Peripheral vascular disease ([place])   . SVT (supraventricular tachycardia) ([place])    . Vitamin B12 deficiency      Assessment: HP is a 82 yo male admitted with CVA. Pharmacy has been consulted to dose enoxaparin for DVT Prophylaxis. Patient was previously on heparin SQ and received his last dose last night around 2300.CBC is stable .  Scr- 1.44 so CrCL > 30 ml/min    Plan:  Lovenox 40 mg Once a day  Monitor for s/sx of bleeding Pharmacy will sign off.   Pasty Spillers D Pharmacy Resident 10/24/2015,12:00 PM

## 2015-10-24 NOTE — Progress Notes (Signed)
ANTICOAGULATION CONSULT NOTE - Initial Consult  Pharmacy Consult for enoxaparin Indication: DVT prophylaxis   Allergies  Allergen Reactions  . Lipitor [Atorvastatin]     Muscle pain   . Zetia [Ezetimibe] Hives    Patient Measurements: Height: 5\' 8"  (172.7 cm) Weight: 187 lb 9.8 oz (85.1 kg) IBW/kg (Calculated) : 68.4   Vital Signs: Temp: 97.7 F (36.5 C) (08/26 0629) Temp Source: Oral (08/26 0629) BP: 160/78 (08/26 0830) Pulse Rate: 63 (08/26 0830)  Labs:  Recent Labs  10/21/15 1813  HGB 15.9  HCT 47.5  PLT 155  CREATININE 1.44*    Estimated Creatinine Clearance: 39.1 mL/min (by C-G formula based on SCr of 1.44 mg/dL).   Medical History: Past Medical History:  Diagnosis Date  . Adenomatous polyp   . CAD (coronary artery disease)   . Chronic kidney disease   . Diverticulosis   . DVT (deep venous thrombosis) ([place])   . Gout   . History of pulmonary embolus (PE)    from a DVT in 2008  . Hyperglycemia   . Hyperlipidemia   . Hypertension   . Mallory-Weiss tear   . Peripheral vascular disease ([place])   . SVT (supraventricular tachycardia) ([place])   . Vitamin B12 deficiency       Assessment: [name] is a 82 yo male admitted with CVA. Pharmacy has been consulted to dose enoxaparin for DVT Prophylaxis. Patient was previously on heparin SQ and received his last dose last night around 2300.CBC is stable .  Scr- 1.44 so CrCL > 30 ml/min     Plan:  Lovenox 40 mg Once a day  Monitor for s/sx of bleeding Pharmacy will sign off.    Pasty Spillers D Pharmacy Resident Pager: [phone] 10/24/2015,12:18 PM

## 2015-10-25 ENCOUNTER — Inpatient Hospital Stay (HOSPITAL_COMMUNITY): Payer: Medicare Other

## 2015-10-25 ENCOUNTER — Encounter (HOSPITAL_COMMUNITY): Payer: Self-pay | Admitting: *Deleted

## 2015-10-25 ENCOUNTER — Inpatient Hospital Stay (HOSPITAL_COMMUNITY): Payer: Medicare Other | Admitting: Physical Therapy

## 2015-10-25 DIAGNOSIS — F32A Depression, unspecified: Secondary | ICD-10-CM

## 2015-10-25 DIAGNOSIS — D72829 Elevated white blood cell count, unspecified: Secondary | ICD-10-CM

## 2015-10-25 DIAGNOSIS — R319 Hematuria, unspecified: Secondary | ICD-10-CM

## 2015-10-25 DIAGNOSIS — F329 Major depressive disorder, single episode, unspecified: Secondary | ICD-10-CM

## 2015-10-25 DIAGNOSIS — R58 Hemorrhage, not elsewhere classified: Secondary | ICD-10-CM

## 2015-10-25 LAB — URINALYSIS, ROUTINE W REFLEX MICROSCOPIC
BILIRUBIN URINE: NEGATIVE
Glucose, UA: 250 mg/dL — AB
Hgb urine dipstick: NEGATIVE
KETONES UR: NEGATIVE mg/dL
Leukocytes, UA: NEGATIVE
NITRITE: NEGATIVE
PROTEIN: NEGATIVE mg/dL
Specific Gravity, Urine: 1.023 (ref 1.005–1.030)
pH: 5.5 (ref 5.0–8.0)

## 2015-10-25 LAB — CBC WITH DIFFERENTIAL/PLATELET
BASOS PCT: 0 %
Basophils Absolute: 0 10*3/uL (ref 0.0–0.1)
EOS ABS: 0.1 10*3/uL (ref 0.0–0.7)
Eosinophils Relative: 1 %
HCT: 45.2 % (ref 39.0–52.0)
HEMOGLOBIN: 15.5 g/dL (ref 13.0–17.0)
Lymphocytes Relative: 10 %
Lymphs Abs: 1.2 10*3/uL (ref 0.7–4.0)
MCH: 30.9 pg (ref 26.0–34.0)
MCHC: 34.3 g/dL (ref 30.0–36.0)
MCV: 90.2 fL (ref 78.0–100.0)
MONOS PCT: 10 %
Monocytes Absolute: 1.2 10*3/uL — ABNORMAL HIGH (ref 0.1–1.0)
NEUTROS PCT: 79 %
Neutro Abs: 9.2 10*3/uL — ABNORMAL HIGH (ref 1.7–7.7)
Platelets: 200 10*3/uL (ref 150–400)
RBC: 5.01 MIL/uL (ref 4.22–5.81)
RDW: 14 % (ref 11.5–15.5)
WBC: 11.7 10*3/uL — AB (ref 4.0–10.5)

## 2015-10-25 LAB — BASIC METABOLIC PANEL
Anion gap: 11 (ref 5–15)
BUN: 29 mg/dL — ABNORMAL HIGH (ref 6–20)
CALCIUM: 9.4 mg/dL (ref 8.9–10.3)
CO2: 21 mmol/L — ABNORMAL LOW (ref 22–32)
CREATININE: 1.68 mg/dL — AB (ref 0.61–1.24)
Chloride: 104 mmol/L (ref 101–111)
GFR calc non Af Amer: 35 mL/min — ABNORMAL LOW (ref >60)
GFR, EST AFRICAN AMERICAN: 41 mL/min — AB (ref >60)
Glucose, Bld: 168 mg/dL — ABNORMAL HIGH (ref 65–99)
Potassium: 3.9 mmol/L (ref 3.5–5.1)
SODIUM: 136 mmol/L (ref 135–145)

## 2015-10-25 LAB — GLUCOSE, CAPILLARY
GLUCOSE-CAPILLARY: 189 mg/dL — AB (ref 65–99)
GLUCOSE-CAPILLARY: 259 mg/dL — AB (ref 65–99)
Glucose-Capillary: 163 mg/dL — ABNORMAL HIGH (ref 65–99)
Glucose-Capillary: 117 mg/dL — ABNORMAL HIGH (ref 65–99)
Glucose-Capillary: 333 mg/dL — ABNORMAL HIGH (ref 65–99)

## 2015-10-25 MED ORDER — HYDRALAZINE HCL 50 MG PO TABS
100.0000 mg | ORAL_TABLET | Freq: Four times a day (QID) | ORAL | Status: DC
  Administered 2015-10-25 – 2015-11-11 (×68): 100 mg via ORAL
  Filled 2015-10-25 (×70): qty 2

## 2015-10-25 MED ORDER — SERTRALINE HCL 50 MG PO TABS
50.0000 mg | ORAL_TABLET | Freq: Every day | ORAL | Status: DC
  Administered 2015-10-25 – 2015-10-26 (×2): 50 mg via ORAL
  Filled 2015-10-25 (×2): qty 1

## 2015-10-25 NOTE — Progress Notes (Signed)
Subjective/Complaints: Pt seen laying in bed this AM.  He has just woken up and has some difficulty with word finding, but states he feels he had some blood in his urine this AM.    ROS-  +Hematuria.  Denies CP,SOB, N/V/D  Objective: Vital Signs: Blood pressure 126/67, pulse 68, temperature 97.7 F (36.5 C), temperature source Oral, resp. rate 17, height 5\' 8"  (1.727 m), weight 85.1 kg (187 lb 9.8 oz), SpO2 97 %. No results found. Results for orders placed or performed during the hospital encounter of 10/21/15 (from the past 72 hour(s))  Glucose, capillary     Status: Abnormal   Collection Time: 10/22/15 12:07 PM  Result Value Ref Range   Glucose-Capillary 174 (H) 65 - 99 mg/dL  Glucose, capillary     Status: Abnormal   Collection Time: 10/22/15  4:24 PM  Result Value Ref Range   Glucose-Capillary 282 (H) 65 - 99 mg/dL  Glucose, capillary     Status: Abnormal   Collection Time: 10/22/15  9:06 PM  Result Value Ref Range   Glucose-Capillary 190 (H) 65 - 99 mg/dL  Glucose, capillary     Status: Abnormal   Collection Time: 10/23/15  6:40 AM  Result Value Ref Range   Glucose-Capillary 129 (H) 65 - 99 mg/dL  Glucose, capillary     Status: Abnormal   Collection Time: 10/23/15  4:43 PM  Result Value Ref Range   Glucose-Capillary 255 (H) 65 - 99 mg/dL  Glucose, capillary     Status: Abnormal   Collection Time: 10/23/15  9:19 PM  Result Value Ref Range   Glucose-Capillary 212 (H) 65 - 99 mg/dL  Glucose, capillary     Status: Abnormal   Collection Time: 10/24/15  6:56 AM  Result Value Ref Range   Glucose-Capillary 124 (H) 65 - 99 mg/dL  Glucose, capillary     Status: Abnormal   Collection Time: 10/24/15  9:36 AM  Result Value Ref Range   Glucose-Capillary 167 (H) 65 - 99 mg/dL  Glucose, capillary     Status: Abnormal   Collection Time: 10/24/15 11:39 AM  Result Value Ref Range   Glucose-Capillary 183 (H) 65 - 99 mg/dL   Comment 1 Notify RN   Glucose, capillary     Status:  Abnormal   Collection Time: 10/24/15  4:25 PM  Result Value Ref Range   Glucose-Capillary 221 (H) 65 - 99 mg/dL  Glucose, capillary     Status: Abnormal   Collection Time: 10/25/15  2:46 AM  Result Value Ref Range   Glucose-Capillary 163 (H) 65 - 99 mg/dL   Comment 1 Notify RN   Glucose, capillary     Status: Abnormal   Collection Time: 10/25/15  6:44 AM  Result Value Ref Range   Glucose-Capillary 117 (H) 65 - 99 mg/dL   Comment 1 Notify RN      HEENT: normocephalic, atraumatic.  Cardio: RRR and no murmur Resp: CTA B/L and unlabored GI: BS positive and NT, ND Skin:   Intact. Warm and dry. Neuro: Alert/Oriented Motor 4+/5 R delt, bi, tri, grip. Apraxia 4+/5 R HF, KE , ADF/PF  Dysarthria Musc/Skel:  Other Erythema , tenderness and sweling R MCP 2 and 3 (improving) Gen NAD. Vitals signs reviewed.    Assessment/Plan: 1. Functional deficits secondary to Left pontine infarct which require 3+ hours per day of interdisciplinary therapy in a comprehensive inpatient rehab setting. Physiatrist is providing close team supervision and 24 hour management of active medical  problems listed below. Physiatrist and rehab team continue to assess barriers to discharge/monitor patient progress toward functional and medical goals. FIM: Function - Bathing Position: Shower Body parts bathed by patient: Right arm, Chest, Abdomen, Front perineal area, Buttocks, Right upper leg, Left upper leg, Left lower leg Body parts bathed by helper: Left arm, Right lower leg, Back Assist Level: Touching or steadying assistance(Pt > 75%)  Function- Upper Body Dressing/Undressing What is the patient wearing?: Pull over shirt/dress Pull over shirt/dress - Perfomed by patient: Thread/unthread left sleeve, Put head through opening, Pull shirt over trunk Pull over shirt/dress - Perfomed by helper: Pull shirt over trunk, Thread/unthread right sleeve Assist Level: Touching or steadying assistance(Pt > 75%) Function -  Lower Body Dressing/Undressing What is the patient wearing?: Non-skid slipper socks, Ted Hose, Underwear, Pants Position: Wheelchair/chair at [employer] - Performed by patient: Thread/unthread left underwear leg, Pull underwear up/down Underwear - Performed by helper: Thread/unthread right underwear leg Pants- Performed by patient: Thread/unthread left pants leg, Pull pants up/down Pants- Performed by helper: Thread/unthread right pants leg Non-skid slipper socks- Performed by helper: Don/doff right sock, Don/doff left sock TED Hose - Performed by helper: Don/doff right TED hose, Don/doff left TED hose Assist for footwear: Dependant  Function - Toileting Toileting steps completed by patient: Adjust clothing prior to toileting, Performs perineal hygiene, Adjust clothing after toileting Toileting steps completed by helper: Adjust clothing after toileting Toileting Assistive Devices: Grab bar or rail  Function - Air cabin crew transfer assistive device: Grab bar Assist level to toilet: Supervision or verbal cues Assist level from toilet: Supervision or verbal cues  Function - Chair/bed transfer Chair/bed transfer method: Stand pivot, Ambulatory Chair/bed transfer assist level: Moderate assist (Pt 50 - 74%/lift or lower) Chair/bed transfer assistive device: Armrests Chair/bed transfer details: Manual facilitation for weight shifting, Verbal cues for precautions/safety, Verbal cues for technique, Verbal cues for sequencing  Function - Locomotion: Wheelchair Will patient use wheelchair at discharge?: Yes Type: Manual Max wheelchair distance: 200 Assist Level: Supervision or verbal cues Assist Level: Supervision or verbal cues Assist Level: Supervision or verbal cues Turns around,maneuvers to table,bed, and toilet,negotiates 3% grade,maneuvers on rugs and over doorsills: No Function - Locomotion: Ambulation Assistive device: Cane-quad Max distance: 150 Assist level:  Touching or steadying assistance (Pt > 75%) Assist level: Touching or steadying assistance (Pt > 75%) Walk 50 feet with 2 turns activity did not occur: Safety/medical concerns Assist level: Touching or steadying assistance (Pt > 75%) Walk 150 feet activity did not occur: Safety/medical concerns Assist level: Touching or steadying assistance (Pt > 75%) Walk 10 feet on uneven surfaces activity did not occur: Safety/medical concerns Assist level: Maximal assist (Pt 25 - 49%)  Function - Comprehension Comprehension: Auditory Comprehension assist level: Understands basic 90% of the time/cues < 10% of the time  Function - Expression Expression: Verbal Expression assist level: Expresses basic 50 - 74% of the time/requires cueing 25 - 49% of the time. Needs to repeat parts of sentences.  Function - Social Interaction Social Interaction assist level: Interacts appropriately with others with medication or extra time (anti-anxiety, antidepressant).  Function - Problem Solving Problem solving assist level: Solves basic 75 - 89% of the time/requires cueing 10 - 24% of the time  Function - Memory Memory assist level: Recognizes or recalls 75 - 89% of the time/requires cueing 10 - 24% of the time Patient normally able to recall (first 3 days only): Current season, That he or she is in a hospital  Medical  Problem List and Plan:   1.  Right hemiparesis dysarthria/dysphagia secondary to left pontine infarct on 10/14/15  Cont CIR 2.  DVT Prophylaxis/Anticoagulation: Subcutaneous heparin changed to lovenox. Monitor platelet count and any signs of bleeding 3. Pain Management: Tylenol as needed 4. Hypertension.   Lopressor discontinued due to bradycardia.  Increased hydralazine 50 mg every 6 hours on 8/26, increased to 100 on 8/17  Norvasc 5 started 8/26  Will need to monitor closely, once off steroids  Monitor with increased mobility, added clonidine .1mg  prn systolic [id number] 5. Sinus arrest due to  vagal spell. Beta blocker has been discontinued for now. No additional episodes. Follow-up per cardiology services. No plan at this time for pacemaker. 6. Neuropsych: This patient is not fully capable of making decisions on his own behalf. 7. Skin/Wound Care: Routine skin check 8. Fluids/Electrolytes/Nutrition: Routine I&O's with follow-up chemistries 9. Chronic renal insufficiency. Baseline creatinine 1.5. Follow-up chemistries 10. Diet-controlled diabetes mellitus. Hemoglobin A1c 7.3. Check blood sugars before meals and at bedtime. Diabetic teaching.  Monitor as activity increased  Will consider scheduling medications if needed after steroids complete CBG (last 3)   Recent Labs  10/24/15 1625 10/25/15 0246 10/25/15 0644  GLUCAP 221* 163* 117*  11. CAD/SVT. No chest pain or shortness of breath.  12. Gout. Placed on colchicine 1 dose as well as prednisone 40 mg daily improving  (started 8/24) 13. Hyperlipidemia. Pravachol 14. ?Hematuria  Per pt.  Per nursing, no blood, however, pt states it was when he began urinating  UA/Ucx ordered  Labs ordered for tomorrow 15. Bleeding  Yesterday, pt noted to have increased bleeding after heparin injection with self-reported hematuria today  UA ordered  Hemoccult stools  Hold Lovenox 8/27  LOS (Days) 4 A FACE TO FACE EVALUATION WAS PERFORMED  Ankit Lorie Phenix 10/25/2015, 9:31 AM

## 2015-10-25 NOTE — Progress Notes (Signed)
Neuro MD assessed patient. Orders given.

## 2015-10-25 NOTE — Progress Notes (Signed)
CT scan returned with some changes from last, MD made aware. Awaiting orders.

## 2015-10-25 NOTE — Progress Notes (Signed)
At 0100 assisted to bathroom to void. Patient reports seeing blood at beginning of stream. Complained of burning with urination. Reports seeing blood in stool yesterday. Dr. Posey Pronto aware. [name] A

## 2015-10-25 NOTE — Progress Notes (Signed)
Occupational Therapy Session Note  Patient Details  Name: [name] MRN: [id number] Date of Birth: September 09, 1927  Today's Date: 10/25/2015 OT Individual Time: 0900-1000 OT Individual Time Calculation (min): 60 min    Short Term Goals: Week 1:  OT Short Term Goal 1 (Week 1): Pt will transfer to the elevated toilet with min assist using LRAD. OT Short Term Goal 2 (Week 1): Pt will complete UB dressing with supervision for pullover shirt. OT Short Term Goal 3 (Week 1): Pt will complete LB dressing with min assist using AE PRN. OT Short Term Goal 4 (Week 1): Pt will use the RUE for bathing tasks with no more than min assist.  OT Short Term Goal 5 (Week 1): Pt will complete all bathing with no more than min assist.    Skilled Therapeutic Interventions/Progress Updates:  ADL-retraining at shower level with focus on improved activity tolerance, pain management, NMR of RUE, lower body dressing skills.   Pt received seated in w/c stating, "I'm not worth a shit today!"    Pt described return of symptoms, tingling at posterior of his head and right arm weakness, although pt moved UE freely through near normal ROM (but slow &  weak).  RN made aware and pt was encouraged to continue with plan of care.   Pt was escorted to bathroom and completed SPT to bench with min assist for safety.   Pt progressed through bathing with min cues to sequence and min assist to wash back and feet d/t LE stiffness.    At shower, pt returned to w/c and tranferred to EOB to dress with setup to provide clothing.    Pt attempted lower body dressing but declared that he used a ledge on his bed PTA to support his feet while he dressed but this could not be duplicated during session therefore OT provided assist to lace pants, & socks.   Pt reported mild fatigue at end of session and requested rest, supine prior to next appt.   Pt completed bed mobility unassisted using bed rail to reposition.   Therapy Documentation Precautions:   Precautions Precautions: Fall Precaution Comments: right hemiparesis with increased pain in the left wrist and hand secondary to gout Restrictions Weight Bearing Restrictions: No   Vital Signs: Therapy Vitals BP: 126/67   Pain: Pain Assessment Pain Assessment: Faces Faces Pain Scale: Hurts little more Pain Type: Other (Comment) Pain Location: Head Pain Orientation: Posterior;Lower Pain Descriptors / Indicators: Tingling Pain Frequency: Intermittent Pain Onset: Unable to tell Pain Intervention(s): RN made aware;Distraction;Back rub;Shower    See Function Navigator for Current Functional Status.   Therapy/Group: Individual Therapy   Second session: Time: [id number] Time Calculation (min):  0 min  Pain Assessment: Diffuse pain, lethary, reports diploplia  Skilled Therapeutic Interventions:  Per dw RN, pt work-up ongoing following decline in medical status.   Therapy on-hold.   Family present and educated on need for bed rest.   See FIM for current functional status  Therapy/Group: Individual Therapy  [place] 10/25/2015, 10:26 AM

## 2015-10-25 NOTE — Progress Notes (Signed)
STROKE TEAM PROGRESS NOTE   SUBJECTIVE (INTERVAL HISTORY) Pt daughter at bedside. Pt started that he got up last night to bathroom but then did not get any sleep. This morning nurses and PT found pt to be a little bit more slurry and right leg a little bit more weakness than yesterday. Pt also complains of hematuria but nurse tech did not see any frank blood. Pt recently BP was high and hydralazine dose increased to 100mg  q6h. Had repeat CT head showed no ICH and left pontine infarct stable in size. Stroke neurology was called for further assistance.   Discussed with daughter, she said that pt seems more depressed and can stay in bed all day long. Sometimes emotional and when he saw his granddaughter or when he stated that he wants to go home, he would cry. Today, he is also frustrated about his constipation and asking to go to bathroom several times. On the time of the visit, he is in the bathroom for bowel movement.    OBJECTIVE Temp:  [97.7 F (36.5 C)] 97.7 F (36.5 C) (08/27 1358) Pulse Rate:  [68-81] 81 (08/27 1358) Resp:  [17-18] 18 (08/27 1358) BP: (126-190)/(51-77) 158/51 (08/27 1358) SpO2:  [97 %-98 %] 98 % (08/27 1358)  CBC:   Recent Labs Lab 10/21/15 1813 10/25/15 1055  WBC 9.4 11.7*  NEUTROABS 8.5* 9.2*  HGB 15.9 15.5  HCT 47.5 45.2  MCV 90.5 90.2  PLT 155 [id number]    Basic Metabolic Panel:   Recent Labs Lab 10/21/15 1813 10/25/15 1055  NA 134* 136  K 4.2 3.9  CL 104 104  CO2 22 21*  GLUCOSE 233* 168*  BUN 18 29*  CREATININE 1.44* 1.68*  CALCIUM 9.2 9.4    Lipid Panel:     Component Value Date/Time   CHOL 207 (H) 10/17/2015 0551   TRIG 159 (H) 10/17/2015 0551   HDL 34 (L) 10/17/2015 0551   CHOLHDL 6.1 10/17/2015 0551   VLDL 32 10/17/2015 0551   LDLCALC 141 (H) 10/17/2015 0551   HgbA1c:  Lab Results  Component Value Date   HGBA1C 7.3 (H) 10/17/2015   Urine Drug Screen:     Component Value Date/Time   LABOPIA NONE DETECTED 10/14/2015 1250    COCAINSCRNUR NONE DETECTED 10/14/2015 1250   LABBENZ NONE DETECTED 10/14/2015 1250   AMPHETMU NONE DETECTED 10/14/2015 1250   THCU NONE DETECTED 10/14/2015 1250   LABBARB NONE DETECTED 10/14/2015 1250      IMAGING I have personally reviewed the radiological images below and agree with the radiology interpretations.  Mr Brain Wo Contrast 10/14/2015 Acute infarct in the left pons. Generalized atrophy with mild chronic microvascular ischemia in the white matter.   Mr Jodene Nam Head/brain Wo Cm 10/14/2015 1. Moderate narrowing of the midportion of the basilar artery, likely secondary to atherosclerotic disease. 2. Multifocal moderate to severe narrowing of the intracranial internal carotid arteries, left worse than right. 3. Possible 1.5 mm aneurysm versus atherosclerotic irregularity at the left posterior communicating artery origin.   Carotid Doppler   There is 1-39% bilateral ICA stenosis. Vertebral artery flow is antegrade.    TTE - Left ventricle: The cavity size was normal. Systolic function was normal. The estimated ejection fraction was in the range of 60% to 65%. Wall motion was normal; there were no regional wall motionbnormalities. Doppler parameters are consistent with abnormal left ventricular relaxation (grade 1 diastolic dysfunction). Doppler parameters are consistent with indeterminate ventricular filling pressure. - Aortic valve: Transvalvular velocity was  within the normal range. There was no stenosis. There was mild regurgitation. - Mitral valve: Transvalvular velocity was within the normal range. There was no evidence for stenosis. There was mild regurgitation. - Right ventricle: The cavity size was normal. Wall thickness was normal. Systolic function was normal. - Atrial septum: No defect or patent foramen ovale was identified by color flow Doppler. - Tricuspid valve: There was no regurgitation.  Ct Head Code Stroke Wo Contrast 10/16/2015 1. Expected CT appearance of the left  paracentral brainstem infarct seen by MRI 2 days ago. No associated hemorrhage or mass effect. 2. No superimposed acute cortically based infarct or new intracranial abnormality. 3. ASPECTS is 10.   CT angio Head and Neck 10/16/2015 1. Negative for intracranial emergent large vessel occlusion. However, the left vertebral artery is severely atherosclerotic throughout its course and is intermittently occluded in the left V4 segment. And there is moderate to severe irregularity and stenosis of the proximal basilar artery which appears somewhat more pronounced than on the MRA yesterday. 2. Additionally there is moderate to severe intracranial atherosclerosis of the bilateral ICA siphons and right PCA P2 segment. 3. Abundant soft plaque in both cervical carotid arteries, with some areas of low-density soft plaque noted. Stenosis up to 55% occurs. 4. Mild to moderate stenosis of the origin of the dominant right vertebral artery.  MRI brain limited 10/17/15 Moderate worsening of acute LEFT paramedian pontine infarction as compared with 10/14/2015.  Ct Head Wo Contrast 10/25/2015 IMPRESSION: 1. Increased prominence of LEFT paramedian pontine infarction compared to CT of 10/16/15 and similar volume to MRI of 10/17/2015. 2. No evidence of new cortical infarction or brainstem infarction. 3. Chronic atrophy ventricular dilatation again noted.     PHYSICAL EXAM  Temp:  [97.7 F (36.5 C)] 97.7 F (36.5 C) (08/27 1358) Pulse Rate:  [68-81] 81 (08/27 1358) Resp:  [17-18] 18 (08/27 1358) BP: (126-190)/(51-77) 158/51 (08/27 1358) SpO2:  [97 %-98 %] 98 % (08/27 1358)  General - Well nourished, well developed, in no apparent distress.  Cardiovascular - Regular rate and rhythm.  Mental Status -  Level of arousal and orientation to time, place, and person were intact. Language including expression, naming, repetition, comprehension was assessed and found intact, however, mild dysarthria. Fund of Knowledge was  assessed and was intact.  Cranial Nerves II - XII - II - Visual field intact OU. III, IV, VI - Extraocular movements intact. V - Facial sensation intact bilaterally. VII - right nasolabial fold flattening. VIII - Hearing & vestibular intact bilaterally. X - Palate elevates symmetrically. XI - Chin turning & shoulder shrug intact bilaterally XII - Tongue protrusion intact.  Motor Strength - The patient's strength was normal in all extremities except subtle right dexterity difficulty and RLE 5-/5 proximal and pronator drift was absent.  Bulk was normal and fasciculations were absent.   Motor Tone - Muscle tone was assessed at the neck and appendages and was normal.  Reflexes - The patient's reflexes were 1+ in all extremities and he had no pathological reflexes.  Sensory - Light touch, temperature/pinprick were assessed and were symmetrical.    Coordination - The patient had normal movements in the hands with no ataxia or dysmetria.  Tremor was absent.  Gait and Station - able to walk to bathroom with assistance, mild limp on the right.   ASSESSMENT/PLAN Mr. Jagjit Klahn is a 82 y.o. male with history of hypertension, coronary artery disease, chronic kidney disease, type 2 diabetes mellitus presenting with trouble  talking. He did not receive IV t-PA due to unknown onset, LKW.   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 L > R narrowing intracranial arteries. 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  Will repeat MRI brain limited to rule out new  stroke or extension of recent stroke  Check orthostatic vitals  Carotid Doppler  No significant stenosis   2D Echo  EF 60-65%  LDL 141  HgbA1c 7.3  lovenox for VTE prophylaxis DIET DYS 3 Room service appropriate? Yes; Fluid consistency: Thin  aspirin 81 mg daily prior to admission, now on DAPT.  Patient counseled to be compliant with his antithrombotic medications  Ongoing aggressive stroke risk factor management  Hx of SVT and cardiac pause  Followed with Dr. Tamala Julian in cardiology in the past  Treated with IV injection  Valsalva helped to abort episodes  Do not feel needs further cardiac event monitoring as current stroke likely small vessel event and hx of SVT pretty typical.  During admission 10/19/15 pt had one episode of cardiac pause followed by bradycardia. Pt was sent to stepdown unit for observation, discontinued beta blocker. No PPM needed.  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 After hours, contact General Neurology

## 2015-10-25 NOTE — Progress Notes (Signed)
Physical Therapy Session Note  Patient Details  Name: [name] MRN: [id number] Date of Birth: 12/29/1927  Today's Date: 10/25/2015 PT Individual Time: 1000-1030 PT Individual Time Calculation (min): 30 min    Short Term Goals: Week 1:  PT Short Term Goal 1 (Week 1): pt will transfer with min assist consistently PT Short Term Goal 2 (Week 1): pt will propel w/c x 150' with supervision PT Short Term Goal 3 (Week 1): pt will perform gait x 150' with min assist, LRAD PT Short Term Goal 4 (Week 1): pt will ascend and descend 12 steps 2 rails, min assist  Skilled Therapeutic Interventions/Progress Updates:    Pt was seen bedside in the am, following OT session. Pt c/o not feeling the same as yesterday. Vitals as below, pt willing to participate with therapy as able. Pt transferred supine to edge of bed with head of bed elevated, side rail and S with increased time. Pt transferred sit to stand with min A and verbal cues. Pt performed stand pivot transfers with min to mod A and verbal cues. Pt ambulated 175 feet x 2, mod A and verbal cues with several standing rest breaks. Pt demonstrated increased R lean as compared to yesterday. Pt required increased cues during ambulation as well as decreased step length noted B. Pt concerned about changes from yesterday. Pt returned to room and transferred edge of bed to supine with side rail and S with increased time. Discussed changes from yesterday with pt's nurse. Pt left sitting up in bed with call bell within reach and bed alarm on.   Therapy Documentation Precautions:  Precautions Precautions: Fall Precaution Comments: right hemiparesis with increased pain in the left wrist and hand secondary to gout Restrictions Weight Bearing Restrictions: No General:   Vital Signs: Therapy Vitals Pulse Rate: 72 BP: 152/62 Pain: No c/o pain.   See Function Navigator for Current Functional Status.   Therapy/Group: Individual Therapy  Dub Amis 10/25/2015, 11:57 AM

## 2015-10-25 NOTE — Progress Notes (Signed)
Patient states he doesn't feel right, has been up since 11 pm with sensation of "bugs crawling on the side of my head." reports increased R sided weakness and word finding problem. Therapy notes weakness. MD made aware, orders given for blood, urine labs, CT of head and chest x-ray.Will continue to monitor.

## 2015-10-25 NOTE — Progress Notes (Signed)
Physical Therapy Session Note  Patient Details  Name: [name] MRN: [id number] Date of Birth: 12-24-1927  Today's Date: 10/25/2015 PT Individual Time: 1100-1100 PT Individual Time Calculation (min): 0 min    Short Term Goals: Week 1:  PT Short Term Goal 1 (Week 1): pt will transfer with min assist consistently PT Short Term Goal 2 (Week 1): pt will propel w/c x 150' with supervision PT Short Term Goal 3 (Week 1): pt will perform gait x 150' with min assist, LRAD PT Short Term Goal 4 (Week 1): pt will ascend and descend 12 steps 2 rails, min assist  Skilled Therapeutic Interventions/Progress Updates:    Discussed with pt's nurse prior to treatment. Pt's nurse placed call to MD. Stat labs have been ordered, MD requested AM session until results of lab work and MD updated. Notified OT to follow up with nursing prior to treatment this pm.   Therapy Documentation Precautions:  Precautions Precautions: Fall Precaution Comments: right hemiparesis with increased pain in the left wrist and hand secondary to gout Restrictions Weight Bearing Restrictions: No General: PT Amount of Missed Time (min): 60 Minutes PT Missed Treatment Reason: MD hold (Comment) (stat labs ordered, hold AM session as per MD) Vital Signs:  Pain: No c/o pain.  See Function Navigator for Current Functional Status.   Therapy/Group: Individual Therapy  Dub Amis 10/25/2015, 12:27 PM

## 2015-10-25 NOTE — Progress Notes (Signed)
Notified MD of MRI results. MD stated he would consult with neuro. Will report to oncoming RN.

## 2015-10-26 ENCOUNTER — Inpatient Hospital Stay (HOSPITAL_COMMUNITY): Payer: Medicare Other

## 2015-10-26 ENCOUNTER — Inpatient Hospital Stay (HOSPITAL_COMMUNITY): Payer: Medicare Other | Admitting: Speech Pathology

## 2015-10-26 ENCOUNTER — Inpatient Hospital Stay (HOSPITAL_COMMUNITY): Payer: Medicare Other | Admitting: Occupational Therapy

## 2015-10-26 DIAGNOSIS — R531 Weakness: Secondary | ICD-10-CM

## 2015-10-26 DIAGNOSIS — I63512 Cerebral infarction due to unspecified occlusion or stenosis of left middle cerebral artery: Secondary | ICD-10-CM

## 2015-10-26 DIAGNOSIS — F329 Major depressive disorder, single episode, unspecified: Secondary | ICD-10-CM

## 2015-10-26 DIAGNOSIS — M6289 Other specified disorders of muscle: Secondary | ICD-10-CM

## 2015-10-26 DIAGNOSIS — M6281 Muscle weakness (generalized): Secondary | ICD-10-CM

## 2015-10-26 DIAGNOSIS — R7309 Other abnormal glucose: Secondary | ICD-10-CM

## 2015-10-26 LAB — GLUCOSE, CAPILLARY
GLUCOSE-CAPILLARY: 150 mg/dL — AB (ref 65–99)
GLUCOSE-CAPILLARY: 251 mg/dL — AB (ref 65–99)
GLUCOSE-CAPILLARY: 250 mg/dL — AB (ref 65–99)
GLUCOSE-CAPILLARY: 250 mg/dL — AB (ref 65–99)

## 2015-10-26 LAB — COMPREHENSIVE METABOLIC PANEL
ALT: 62 U/L (ref 17–63)
ANION GAP: 9 (ref 5–15)
AST: 35 U/L (ref 15–41)
Albumin: 3.2 g/dL — ABNORMAL LOW (ref 3.5–5.0)
Alkaline Phosphatase: 44 U/L (ref 38–126)
BILIRUBIN TOTAL: 0.6 mg/dL (ref 0.3–1.2)
BUN: 30 mg/dL — ABNORMAL HIGH (ref 6–20)
CO2: 23 mmol/L (ref 22–32)
Calcium: 9.4 mg/dL (ref 8.9–10.3)
Chloride: 103 mmol/L (ref 101–111)
Creatinine, Ser: 1.57 mg/dL — ABNORMAL HIGH (ref 0.61–1.24)
GFR calc Af Amer: 44 mL/min — ABNORMAL LOW (ref >60)
GFR, EST NON AFRICAN AMERICAN: 38 mL/min — AB (ref >60)
Glucose, Bld: 134 mg/dL — ABNORMAL HIGH (ref 65–99)
POTASSIUM: 3.9 mmol/L (ref 3.5–5.1)
Sodium: 135 mmol/L (ref 135–145)
TOTAL PROTEIN: 6.6 g/dL (ref 6.5–8.1)

## 2015-10-26 LAB — CBC WITH DIFFERENTIAL/PLATELET
BASOS PCT: 0 %
Basophils Absolute: 0 10*3/uL (ref 0.0–0.1)
Eosinophils Absolute: 0.1 10*3/uL (ref 0.0–0.7)
Eosinophils Relative: 1 %
HCT: 45.6 % (ref 39.0–52.0)
Hemoglobin: 15.1 g/dL (ref 13.0–17.0)
Lymphocytes Relative: 24 %
Lymphs Abs: 2.9 10*3/uL (ref 0.7–4.0)
MCH: 29.9 pg (ref 26.0–34.0)
MCHC: 33.1 g/dL (ref 30.0–36.0)
MCV: 90.3 fL (ref 78.0–100.0)
MONOS PCT: 10 %
Monocytes Absolute: 1.2 10*3/uL — ABNORMAL HIGH (ref 0.1–1.0)
NEUTROS ABS: 7.8 10*3/uL — AB (ref 1.7–7.7)
NEUTROS PCT: 65 %
PLATELETS: 215 10*3/uL (ref 150–400)
RBC: 5.05 MIL/uL (ref 4.22–5.81)
RDW: 14.1 % (ref 11.5–15.5)
WBC: 12 10*3/uL — AB (ref 4.0–10.5)

## 2015-10-26 LAB — URINE CULTURE

## 2015-10-26 LAB — OCCULT BLOOD X 1 CARD TO LAB, STOOL: FECAL OCCULT BLD: NEGATIVE

## 2015-10-26 MED ORDER — FLUOXETINE HCL 10 MG PO CAPS
10.0000 mg | ORAL_CAPSULE | Freq: Every day | ORAL | Status: DC
  Administered 2015-10-26 – 2015-10-28 (×3): 10 mg via ORAL
  Filled 2015-10-26 (×3): qty 1

## 2015-10-26 MED ORDER — ALUM & MAG HYDROXIDE-SIMETH 200-200-20 MG/5ML PO SUSP
30.0000 mL | ORAL | Status: DC | PRN
  Administered 2015-10-26 – 2015-10-28 (×2): 30 mL via ORAL
  Filled 2015-10-26 (×2): qty 30

## 2015-10-26 MED ORDER — PREDNISONE 20 MG PO TABS
20.0000 mg | ORAL_TABLET | Freq: Every day | ORAL | Status: AC
  Administered 2015-10-26 – 2015-10-27 (×2): 20 mg via ORAL
  Filled 2015-10-26: qty 1

## 2015-10-26 NOTE — Progress Notes (Signed)
Spoke at length with patient's daughter related to diabetes and BP management. She reports he doesn't have diabetes, but also reports patient unable to tolerate glucophage. Did not check CBG's or BP's PTA. Talked with her about checking and keeping a log of readings to take to doctor's office. Patient feels facial droop worse. Spoke with Dr. Posey Pronto at [facility], ok to give meds whole with puree. PRN tylenol given this AM for "a little headache." [name] A

## 2015-10-26 NOTE — Progress Notes (Addendum)
Subjective/Complaints: Pt laying in bed this AM.  He states he is tired and feels like his stomach is upset.    ROS-  +Nausea. Denies CP,SOB, Diarrhea  Objective: Vital Signs: Blood pressure (!) 170/59, pulse 90, temperature 98.6 F (37 C), temperature source Oral, resp. rate 17, height _0  (1.727 m), weight 85.1 kg (187 lb 9.8 oz), SpO2 95 %. Ct Head Wo Contrast  Result Date: 10/25/2015 CLINICAL DATA:  New onset right side weakness, and unable to find his words EXAM: CT HEAD WITHOUT CONTRAST TECHNIQUE: Contiguous axial images were obtained from the base of the skull through the vertex without intravenous contrast. COMPARISON:  Brain MRI 10/17/2015 FINDINGS: Brain: Hypodensity in the LEFT pons measures 11 mm x 10 mm similar to 16 mm x 11 mm on MRI of 10/17/2015. Lesion is expanded by CT imaging at same time. No new or cortical infarction or brainstem infarction identified. No intracranial hemorrhage. The ventricles are dilated similar prior no portion cortical atrophy. Vascular: Vascular calcification of the circle Willis vessels. Skull: Unremarkable Sinuses/Orbits: Paranasal sinuses and mastoid air cells are clear. Orbits are clear. Other: None IMPRESSION: 1. Increased prominence of LEFT paramedian pontine infarction compared to CT of 10/16/15 and similar volume to MRI of 10/17/2015. 2. No evidence of new cortical infarction or brainstem infarction. 3. Chronic atrophy ventricular dilatation again noted. Electronically Signed   By: Suzy Bouchard M.D.   On: 10/25/2015 13:57   Dg Chest Port 1 View  Result Date: 10/25/2015 CLINICAL DATA:  Leukocytosis EXAM: PORTABLE CHEST 1 VIEW COMPARISON:  05/12/2016 FINDINGS: The heart size and mediastinal contours are within normal limits. Both lungs are clear. The visualized skeletal structures are unremarkable. IMPRESSION: No active disease. Electronically Signed   By: Kerby Moors M.D.   On: 10/25/2015 13:14   Mr Brain Ltd W/o Cm  Result Date:  10/25/2015 CLINICAL DATA:  82 y/o M; increased slurred speech and right leg weakness with concern for new stroke. EXAM: MRI HEAD WITHOUT CONTRAST TECHNIQUE: Axial and coronal DWI sequences were performed with ADC. COMPARISON:  CT head 10/25/2015.  MR brain 10/17/2015. FINDINGS: Left paramedian pontine infarct is moderately increased in size primarily in the coronal plane greater extrusion towards cerebral peduncle and medulla. There is mild increase mass effect associated with the pontine infarct as well. Addition, there is a new focus of diffusion restriction within the left posterior lateral temporal lobe measuring 11 mm in compatible with interval acute infarct. IMPRESSION: 1. Interval increase in size of the left pontine infarct primarily in the coronal plane with extension toward cerebral peduncle and medulla. 2. New 11 mm acute infarct within left posterior lateral temporal lobe. These results will be called to the ordering clinician or representative by the Radiologist Assistant, and communication documented in the PACS or zVision Dashboard. Electronically Signed   By: Kristine Garbe M.D.   On: 10/25/2015 18:27   Results for orders placed or performed during the hospital encounter of 10/21/15 (from the past 72 hour(s))  Glucose, capillary     Status: Abnormal   Collection Time: 10/23/15  4:43 PM  Result Value Ref Range   Glucose-Capillary 255 (H) 65 - 99 mg/dL  Glucose, capillary     Status: Abnormal   Collection Time: 10/23/15  9:19 PM  Result Value Ref Range   Glucose-Capillary 212 (H) 65 - 99 mg/dL  Glucose, capillary     Status: Abnormal   Collection Time: 10/24/15  6:56 AM  Result Value Ref Range  Glucose-Capillary 124 (H) 65 - 99 mg/dL  Glucose, capillary     Status: Abnormal   Collection Time: 10/24/15  9:36 AM  Result Value Ref Range   Glucose-Capillary 167 (H) 65 - 99 mg/dL  Glucose, capillary     Status: Abnormal   Collection Time: 10/24/15 11:39 AM  Result Value Ref  Range   Glucose-Capillary 183 (H) 65 - 99 mg/dL   Comment 1 Notify RN   Glucose, capillary     Status: Abnormal   Collection Time: 10/24/15  4:25 PM  Result Value Ref Range   Glucose-Capillary 221 (H) 65 - 99 mg/dL  Glucose, capillary     Status: Abnormal   Collection Time: 10/25/15  2:46 AM  Result Value Ref Range   Glucose-Capillary 163 (H) 65 - 99 mg/dL   Comment 1 Notify RN   Glucose, capillary     Status: Abnormal   Collection Time: 10/25/15  6:44 AM  Result Value Ref Range   Glucose-Capillary 117 (H) 65 - 99 mg/dL   Comment 1 Notify RN   Basic metabolic panel     Status: Abnormal   Collection Time: 10/25/15 10:55 AM  Result Value Ref Range   Sodium 136 135 - 145 mmol/L   Potassium 3.9 3.5 - 5.1 mmol/L   Chloride 104 101 - 111 mmol/L   CO2 21 (L) 22 - 32 mmol/L   Glucose, Bld 168 (H) 65 - 99 mg/dL   BUN 29 (H) 6 - 20 mg/dL   Creatinine, Ser 1.68 (H) 0.61 - 1.24 mg/dL   Calcium 9.4 8.9 - 10.3 mg/dL   GFR calc non Af Amer 35 (L) >60 mL/min   GFR calc Af Amer 41 (L) >60 mL/min    Comment: (NOTE) The eGFR has been calculated using the CKD EPI equation. This calculation has not been validated in all clinical situations. eGFR's persistently <60 mL/min signify possible Chronic Kidney Disease.    Anion gap 11 5 - 15  CBC with Differential/Platelet     Status: Abnormal   Collection Time: 10/25/15 10:55 AM  Result Value Ref Range   WBC 11.7 (H) 4.0 - 10.5 K/uL   RBC 5.01 4.22 - 5.81 MIL/uL   Hemoglobin 15.5 13.0 - 17.0 g/dL   HCT 45.2 39.0 - 52.0 %   MCV 90.2 78.0 - 100.0 fL   MCH 30.9 26.0 - 34.0 pg   MCHC 34.3 30.0 - 36.0 g/dL   RDW 14.0 11.5 - 15.5 %   Platelets 200 150 - 400 K/uL   Neutrophils Relative % 79 %   Neutro Abs 9.2 (H) 1.7 - 7.7 K/uL   Lymphocytes Relative 10 %   Lymphs Abs 1.2 0.7 - 4.0 K/uL   Monocytes Relative 10 %   Monocytes Absolute 1.2 (H) 0.1 - 1.0 K/uL   Eosinophils Relative 1 %   Eosinophils Absolute 0.1 0.0 - 0.7 K/uL   Basophils Relative  0 %   Basophils Absolute 0.0 0.0 - 0.1 K/uL  Glucose, capillary     Status: Abnormal   Collection Time: 10/25/15 11:40 AM  Result Value Ref Range   Glucose-Capillary 189 (H) 65 - 99 mg/dL   Comment 1 Notify RN   Glucose, capillary     Status: Abnormal   Collection Time: 10/25/15  4:31 PM  Result Value Ref Range   Glucose-Capillary 333 (H) 65 - 99 mg/dL   Comment 1 Notify RN   Urinalysis, Routine w reflex microscopic (not at [facility])  Status: Abnormal   Collection Time: 10/25/15  5:12 PM  Result Value Ref Range   Color, Urine YELLOW YELLOW   APPearance CLEAR CLEAR   Specific Gravity, Urine 1.023 1.005 - 1.030   pH 5.5 5.0 - 8.0   Glucose, UA 250 (A) NEGATIVE mg/dL   Hgb urine dipstick NEGATIVE NEGATIVE   Bilirubin Urine NEGATIVE NEGATIVE   Ketones, ur NEGATIVE NEGATIVE mg/dL   Protein, ur NEGATIVE NEGATIVE mg/dL   Nitrite NEGATIVE NEGATIVE   Leukocytes, UA NEGATIVE NEGATIVE    Comment: MICROSCOPIC NOT DONE ON URINES WITH NEGATIVE PROTEIN, BLOOD, LEUKOCYTES, NITRITE, OR GLUCOSE <1000 mg/dL.  Glucose, capillary     Status: Abnormal   Collection Time: 10/25/15  9:10 PM  Result Value Ref Range   Glucose-Capillary 259 (H) 65 - 99 mg/dL  CBC with Differential/Platelet     Status: Abnormal   Collection Time: 10/26/15  5:28 AM  Result Value Ref Range   WBC 12.0 (H) 4.0 - 10.5 K/uL   RBC 5.05 4.22 - 5.81 MIL/uL   Hemoglobin 15.1 13.0 - 17.0 g/dL   HCT 45.6 39.0 - 52.0 %   MCV 90.3 78.0 - 100.0 fL   MCH 29.9 26.0 - 34.0 pg   MCHC 33.1 30.0 - 36.0 g/dL   RDW 14.1 11.5 - 15.5 %   Platelets 215 150 - 400 K/uL   Neutrophils Relative % 65 %   Neutro Abs 7.8 (H) 1.7 - 7.7 K/uL   Lymphocytes Relative 24 %   Lymphs Abs 2.9 0.7 - 4.0 K/uL   Monocytes Relative 10 %   Monocytes Absolute 1.2 (H) 0.1 - 1.0 K/uL   Eosinophils Relative 1 %   Eosinophils Absolute 0.1 0.0 - 0.7 K/uL   Basophils Relative 0 %   Basophils Absolute 0.0 0.0 - 0.1 K/uL  Comprehensive metabolic panel     Status:  Abnormal   Collection Time: 10/26/15  5:28 AM  Result Value Ref Range   Sodium 135 135 - 145 mmol/L   Potassium 3.9 3.5 - 5.1 mmol/L   Chloride 103 101 - 111 mmol/L   CO2 23 22 - 32 mmol/L   Glucose, Bld 134 (H) 65 - 99 mg/dL   BUN 30 (H) 6 - 20 mg/dL   Creatinine, Ser 1.57 (H) 0.61 - 1.24 mg/dL   Calcium 9.4 8.9 - 10.3 mg/dL   Total Protein 6.6 6.5 - 8.1 g/dL   Albumin 3.2 (L) 3.5 - 5.0 g/dL   AST 35 15 - 41 U/L   ALT 62 17 - 63 U/L   Alkaline Phosphatase 44 38 - 126 U/L   Total Bilirubin 0.6 0.3 - 1.2 mg/dL   GFR calc non Af Amer 38 (L) >60 mL/min   GFR calc Af Amer 44 (L) >60 mL/min    Comment: (NOTE) The eGFR has been calculated using the CKD EPI equation. This calculation has not been validated in all clinical situations. eGFR's persistently <60 mL/min signify possible Chronic Kidney Disease.    Anion gap 9 5 - 15  Glucose, capillary     Status: Abnormal   Collection Time: 10/26/15  6:43 AM  Result Value Ref Range   Glucose-Capillary 150 (H) 65 - 99 mg/dL  Occult blood card to lab, stool RN will collect     Status: None   Collection Time: 10/26/15  8:53 AM  Result Value Ref Range   Fecal Occult Bld NEGATIVE NEGATIVE     HEENT: normocephalic, atraumatic.  Cardio: RRR and no  murmur Resp: CTA B/L and unlabored GI: BS positive and NT, ND Skin:   Intact. Warm and dry. Neuro: Alert/Oriented Motor 4/5 R delt, bi, tri, grip. Apraxia 4-/5 R HF, KE ,4+/5 ADF/PF  LUE/LLE: 4+/5 proximal to distal Dysarthria Musc/Skel:  Other Erythema , tenderness and sweling R MCP 2 and 3 (improving) Gen NAD. Vitals signs reviewed.    Assessment/Plan: 1. Functional deficits secondary to Left pontine infarct and new left posterior lateral temporal infarct which require 3+ hours per day of interdisciplinary therapy in a comprehensive inpatient rehab setting. Physiatrist is providing close team supervision and 24 hour management of active medical problems listed below. Physiatrist and rehab  team continue to assess barriers to discharge/monitor patient progress toward functional and medical goals. FIM: Function - Bathing Position: Shower Body parts bathed by patient: Right arm, Left arm, Chest, Abdomen, Front perineal area, Buttocks, Right upper leg, Left upper leg Body parts bathed by helper: Back, Right lower leg, Left lower leg Assist Level: Touching or steadying assistance(Pt > 75%)  Function- Upper Body Dressing/Undressing What is the patient wearing?: Pull over shirt/dress Pull over shirt/dress - Perfomed by patient: Thread/unthread right sleeve, Thread/unthread left sleeve, Put head through opening, Pull shirt over trunk Pull over shirt/dress - Perfomed by helper: Pull shirt over trunk, Thread/unthread right sleeve Assist Level: Supervision or verbal cues Function - Lower Body Dressing/Undressing What is the patient wearing?: Ted Hose, Non-skid slipper socks, Pants Position: Sitting EOB Underwear - Performed by patient: Thread/unthread left underwear leg, Pull underwear up/down Underwear - Performed by helper: Thread/unthread right underwear leg Pants- Performed by patient: Pull pants up/down Pants- Performed by helper: Thread/unthread right pants leg, Thread/unthread left pants leg Non-skid slipper socks- Performed by helper: Don/doff right sock, Don/doff left sock TED Hose - Performed by helper: Don/doff right TED hose, Don/doff left TED hose Assist for footwear: Dependant Assist for lower body dressing:  (Max Assist (d/t bed not similar to bed at home, per pt))  Function - Toileting Toileting activity did not occur: Refused Toileting steps completed by patient: Adjust clothing prior to toileting, Performs perineal hygiene, Adjust clothing after toileting Toileting steps completed by helper: Adjust clothing after toileting Toileting Assistive Devices: Grab bar or rail Assist level: Touching or steadying assistance (Pt.75%)  Function - Air cabin crew  transfer assistive device: Grab bar Assist level to toilet: Supervision or verbal cues Assist level from toilet: Supervision or verbal cues  Function - Chair/bed transfer Chair/bed transfer method: Stand pivot Chair/bed transfer assist level: Moderate assist (Pt 50 - 74%/lift or lower) Chair/bed transfer assistive device: Armrests Chair/bed transfer details: Manual facilitation for weight shifting, Verbal cues for precautions/safety, Verbal cues for technique, Verbal cues for sequencing  Function - Locomotion: Wheelchair Will patient use wheelchair at discharge?: Yes Type: Manual Max wheelchair distance: 200 Assist Level: Supervision or verbal cues Assist Level: Supervision or verbal cues Assist Level: Supervision or verbal cues Turns around,maneuvers to table,bed, and toilet,negotiates 3% grade,maneuvers on rugs and over doorsills: No Function - Locomotion: Ambulation Assistive device: No device Max distance: 175 Assist level: Moderate assist (Pt 50 - 74%) Assist level: Moderate assist (Pt 50 - 74%) Walk 50 feet with 2 turns activity did not occur: Safety/medical concerns Assist level: Moderate assist (Pt 50 - 74%) Walk 150 feet activity did not occur: Safety/medical concerns Assist level: Moderate assist (Pt 50 - 74%) Walk 10 feet on uneven surfaces activity did not occur: Safety/medical concerns Assist level: Maximal assist (Pt 25 - 49%)  Function - Comprehension  Comprehension: Auditory Comprehension assist level: Understands basic 90% of the time/cues < 10% of the time  Function - Expression Expression: Verbal Expression assist level: Expresses basic 50 - 74% of the time/requires cueing 25 - 49% of the time. Needs to repeat parts of sentences.  Function - Social Interaction Social Interaction assist level: Interacts appropriately with others with medication or extra time (anti-anxiety, antidepressant).  Function - Problem Solving Problem solving assist level: Solves  basic 75 - 89% of the time/requires cueing 10 - 24% of the time  Function - Memory Memory assist level: Recognizes or recalls 75 - 89% of the time/requires cueing 10 - 24% of the time Patient normally able to recall (first 3 days only): Current season, That he or she is in a hospital  Medical Problem List and Plan:   1.  Right hemiparesis dysarthria/dysphagia secondary to left pontine infarct on 10/14/15 and left posterior lateral temporal infarct 8/27  Cont CIR  New stroke on 8/27, MRI reviewed with new stroke, spoke to Neurology, no changes in medication, Neurology to speak with family regarding TTE/loop - spoke with family at length and updated on plan 2.  DVT Prophylaxis/Anticoagulation: Subcutaneous heparin changed to lovenox. Monitor platelet count and any signs of bleeding 3. Pain Management: Tylenol as needed 4. Hypertension.   Lopressor discontinued due to bradycardia.  Increased hydralazine 50 mg every 6 hours on 8/26, increased to 100 on 8/27  Norvasc 5 started 8/26  Will need to monitor closely, once off steroids  Will not make further changes at present and allow for permissive HTN 5. Sinus arrest due to vagal spell. Beta blocker has been discontinued for now. No additional episodes. Follow-up per cardiology services. No plan at this time for pacemaker. 6. Neuropsych: This patient is not fully capable of making decisions on his own behalf.  Started on Zoloft 8/27, changed to Prozaac on 8/28 7. Skin/Wound Care: Routine skin check 8. Fluids/Electrolytes/Nutrition: Routine I&O's with follow-up chemistries 9. Chronic renal insufficiency. Baseline creatinine 1.5.   Cr. 1.57 on 8/28  Will cont to monitor 10. Diet-controlled diabetes mellitus. Hemoglobin A1c 7.3. Check blood sugars before meals and at bedtime. Diabetic teaching.  Monitor as activity increased  Will consider scheduling medications if needed after steroids complete CBG (last 3)   Recent Labs  10/25/15 1631  10/25/15 2110 10/26/15 0643  GLUCAP 333* 259* 150*  11. CAD/SVT. No chest pain or shortness of breath.  12. Gout. Placed on colchicine 1 dose as well as prednisone 40 mg daily improving  (started 8/24), decreased to 29m on 8/29 x2 days 13. Hyperlipidemia. Pravachol 14. ?Hematuria  Per pt.  Per nursing, no blood, however, pt states it was when he began urinating  UA relatively unremarkable, Ucx pending  Hb stable 15. Subjective bleeding: No objective findings  Yesterday, pt noted to have increased bleeding after heparin injection with self-reported hematuria   UA negative  Hemoccult negative  Hold Lovenox 8/27  Time spent with patient: 40 min, with counseling and coordination of care >35 min.  LOS (Days) 5 A FACE TO FACE EVALUATION WAS PERFORMED  Ankit ALorie Phenix8/28/2017, 10:22 AM

## 2015-10-26 NOTE — Progress Notes (Signed)
Occupational Therapy Session Note  Patient Details  Name: [name] MRN: [id number] Date of Birth: 01/12/1928  Today's Date: 10/26/2015 OT Individual Time: 0907-1001 OT Individual Time Calculation (min): 54 min     Short Term Goals: Week 1:  OT Short Term Goal 1 (Week 1): Pt will transfer to the elevated toilet with min assist using LRAD. OT Short Term Goal 2 (Week 1): Pt will complete UB dressing with supervision for pullover shirt. OT Short Term Goal 3 (Week 1): Pt will complete LB dressing with min assist using AE PRN. OT Short Term Goal 4 (Week 1): Pt will use the RUE for bathing tasks with no more than min assist.  OT Short Term Goal 5 (Week 1): Pt will complete all bathing with no more than min assist.    Skilled Therapy Bathing and dressing sitting EOB this session secondary to not feeling well with reports of nausea and fatigue.  Pt with BP checked sitting EOB initially 135/56.  He was able to work on bathing with min facilitation to incorporate the RUE into washing the left arm.  Decreased ability to wash either foot secondary to limited flexibility.  Pt with increased dizziness after standing to wash peri area with BP checked again at 156/61.  Pt ambulated to the bathroom for bowel movement with min steady assist.  Min assist for thoroughness with peri hygiene.  Therapist assisted with donning LB clothing secondary to decreased time and pt not feeling well.  Positioned pt back in the bed with call button and phone in reach.  Discussed session with daughters who were out of the room for privacy.  Also discussed the need for neuropsych consult with family and SW for coping.   Therapy Documentation Precautions:  Precautions Precautions: Fall Precaution Comments: right hemiparesis with increased pain in the left wrist and hand secondary to gout Restrictions Weight Bearing Restrictions: No  Pain: Pain Assessment Pain Assessment: No/denies pain ADL: See Function Navigator for  Current Functional Status.   Therapy/Group: Individual Therapy  Cuca Benassi OTR/L 10/26/2015, 12:21 PM

## 2015-10-26 NOTE — Progress Notes (Signed)
Pt. Verbalized having some chest discomfort,manual VS were check. BP 170/80,p: 96. 100 r.a. Algis Liming PAC were notified.Orders were received.Keep monitoring pt. Closely.

## 2015-10-26 NOTE — Progress Notes (Signed)
STROKE TEAM PROGRESS NOTE   SUBJECTIVE (INTERVAL HISTORY) Pt daughter at bedside. MRI showed left MCA cortical small acute infarct in addition to the left pontine infarct. Pt has multifocal intracranial stenosis, which likely the cause of the strokes. But can not rule out cardioembolic. Pt had hx of esophagus fissure s/p repair, not candidate for TEE, will consider loop or 30 day monitoring.    OBJECTIVE Temp:  [98.3 F (36.8 C)-98.6 F (37 C)] 98.6 F (37 C) (08/28 1437) Pulse Rate:  [79-96] 96 (08/28 1437) Resp:  [17-20] 20 (08/28 1437) BP: (165-196)/(59-72) 173/72 (08/28 1437) SpO2:  [95 %-96 %] 96 % (08/28 1437)  CBC:   Recent Labs Lab 10/25/15 1055 10/26/15 0528  WBC 11.7* 12.0*  NEUTROABS 9.2* 7.8*  HGB 15.5 15.1  HCT 45.2 45.6  MCV 90.2 90.3  PLT 200 [id number]    Basic Metabolic Panel:   Recent Labs Lab 10/25/15 1055 10/26/15 0528  NA 136 135  K 3.9 3.9  CL 104 103  CO2 21* 23  GLUCOSE 168* 134*  BUN 29* 30*  CREATININE 1.68* 1.57*  CALCIUM 9.4 9.4    Lipid Panel:     Component Value Date/Time   CHOL 207 (H) 10/17/2015 0551   TRIG 159 (H) 10/17/2015 0551   HDL 34 (L) 10/17/2015 0551   CHOLHDL 6.1 10/17/2015 0551   VLDL 32 10/17/2015 0551   LDLCALC 141 (H) 10/17/2015 0551   HgbA1c:  Lab Results  Component Value Date   HGBA1C 7.3 (H) 10/17/2015   Urine Drug Screen:     Component Value Date/Time   LABOPIA NONE DETECTED 10/14/2015 1250   COCAINSCRNUR NONE DETECTED 10/14/2015 1250   LABBENZ NONE DETECTED 10/14/2015 1250   AMPHETMU NONE DETECTED 10/14/2015 1250   THCU NONE DETECTED 10/14/2015 1250   LABBARB NONE DETECTED 10/14/2015 1250      IMAGING I have personally reviewed the radiological images below and agree with the radiology interpretations.  Mr Brain Wo Contrast 10/14/2015 Acute infarct in the left pons. Generalized atrophy with mild chronic microvascular ischemia in the white matter.   Mr Jodene Nam Head/brain Wo Cm 10/14/2015 1. Moderate  narrowing of the midportion of the basilar artery, likely secondary to atherosclerotic disease. 2. Multifocal moderate to severe narrowing of the intracranial internal carotid arteries, left worse than right. 3. Possible 1.5 mm aneurysm versus atherosclerotic irregularity at the left posterior communicating artery origin.   Carotid Doppler   There is 1-39% bilateral ICA stenosis. Vertebral artery flow is antegrade.    TTE - Left ventricle: The cavity size was normal. Systolic function was normal. The estimated ejection fraction was in the range of 60% to 65%. Wall motion was normal; there were no regional wall motionbnormalities. Doppler parameters are consistent with abnormal left ventricular relaxation (grade 1 diastolic dysfunction). Doppler parameters are consistent with indeterminate ventricular filling pressure. - Aortic valve: Transvalvular velocity was within the normal range. There was no stenosis. There was mild regurgitation. - Mitral valve: Transvalvular velocity was within the normal range. There was no evidence for stenosis. There was mild regurgitation. - Right ventricle: The cavity size was normal. Wall thickness was normal. Systolic function was normal. - Atrial septum: No defect or patent foramen ovale was identified by color flow Doppler. - Tricuspid valve: There was no regurgitation.  Ct Head Code Stroke Wo Contrast 10/16/2015 1. Expected CT appearance of the left paracentral brainstem infarct seen by MRI 2 days ago. No associated hemorrhage or mass effect. 2. No superimposed acute  cortically based infarct or new intracranial abnormality. 3. ASPECTS is 10.   CT angio Head and Neck 10/16/2015 1. Negative for intracranial emergent large vessel occlusion. However, the left vertebral artery is severely atherosclerotic throughout its course and is intermittently occluded in the left V4 segment. And there is moderate to severe irregularity and stenosis of the proximal basilar artery  which appears somewhat more pronounced than on the MRA yesterday. 2. Additionally there is moderate to severe intracranial atherosclerosis of the bilateral ICA siphons and right PCA P2 segment. 3. Abundant soft plaque in both cervical carotid arteries, with some areas of low-density soft plaque noted. Stenosis up to 55% occurs. 4. Mild to moderate stenosis of the origin of the dominant right vertebral artery.  MRI brain limited 10/17/15 Moderate worsening of acute LEFT paramedian pontine infarction as compared with 10/14/2015.  Ct Head Wo Contrast 10/25/2015 IMPRESSION: 1. Increased prominence of LEFT paramedian pontine infarction compared to CT of 10/16/15 and similar volume to MRI of 10/17/2015. 2. No evidence of new cortical infarction or brainstem infarction. 3. Chronic atrophy ventricular dilatation again noted.   MRI 10/25/15 1. Interval increase in size of the left pontine infarct primarily in the coronal plane with extension toward cerebral peduncle and medulla. 2. New 11 mm acute infarct within left posterior lateral temporal lobe.   PHYSICAL EXAM  Temp:  [98.3 F (36.8 C)-98.6 F (37 C)] 98.6 F (37 C) (08/28 1437) Pulse Rate:  [79-96] 96 (08/28 1437) Resp:  [17-20] 20 (08/28 1437) BP: (165-196)/(59-72) 173/72 (08/28 1437) SpO2:  [95 %-96 %] 96 % (08/28 1437)  General - Well nourished, well developed, in no apparent distress.  Cardiovascular - Regular rate and rhythm.  Mental Status -  Level of arousal and orientation to time, place, and person were intact. Language including expression, naming, repetition, comprehension was assessed and found intact, however, mild dysarthria. Fund of Knowledge was assessed and was intact.  Cranial Nerves II - XII - II - Visual field intact OU. III, IV, VI - Extraocular movements intact. V - Facial sensation intact bilaterally. VII - right nasolabial fold flattening. VIII - Hearing & vestibular intact bilaterally. X - Palate elevates  symmetrically. XI - Chin turning & shoulder shrug intact bilaterally XII - Tongue protrusion intact.  Motor Strength - The patient's strength was normal in all extremities except subtle right dexterity difficulty and RLE 5-/5 proximal and pronator drift was absent.  Bulk was normal and fasciculations were absent.   Motor Tone - Muscle tone was assessed at the neck and appendages and was normal.  Reflexes - The patient's reflexes were 1+ in all extremities and he had no pathological reflexes.  Sensory - Light touch, temperature/pinprick were assessed and were symmetrical.    Coordination - The patient had normal movements in the hands with no ataxia or dysmetria.  Tremor was absent.  Gait and Station - able to walk to bathroom with assistance, mild limp on the right.   ASSESSMENT/PLAN Mr. Emonte Stracke is a 82 y.o. male with history of hypertension, coronary artery disease, chronic kidney disease, type 2 diabetes mellitus presenting with trouble talking. He did not receive IV t-PA due to unknown onset, LKW.   Stroke:  Acute left MCA small cortical infarct and subacute Left pontine infarct secondary to small vessel disease vs. cardioembolic  Resultant  Right facial droop and right hand subtle dexterity difficulty  MRI 10/14/15 L pontine infarct  MRA  Moderate narrowing mid BA. L > R narrowing intracranial arteries.  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  Carotid Doppler  No significant stenosis   2D Echo  EF 60-65%  Not candidate for TEE due to hx of esophagus fissure s/p repair.   Will consider loop or 30 day monitoring  LDL 141  HgbA1c 7.3  lovenox for VTE prophylaxis DIET DYS 3 Room service appropriate? Yes; Fluid consistency: Thin  aspirin 81 mg daily prior to admission, now on DAPT. Continue DAPT for 3 months. And then plavix alone  Patient counseled to be  compliant with his antithrombotic medications  Ongoing aggressive stroke risk factor management  Hx of SVT and cardiac pause  Followed with Dr. Tamala Julian in cardiology in the past  Treated with IV injection  Valsalva helped to abort episodes  Do not feel needs further cardiac event monitoring as current stroke likely small vessel event and hx of SVT pretty typical.  During admission 10/19/15 pt had one episode of cardiac pause followed by bradycardia. Pt was sent to stepdown unit for observation, discontinued beta blocker. No PPM needed.  Leukocytosis   Pt complains of hematuria  UA negative  CXR not remarkable  WBC 8.4->9.4->11.7->12.0  Afebrile   CBC monitoring  Hypertension  Remains elevated On amlodipine 5mg , clonidine PRN, hydralazine 100mg  Q6h Long-term BP goal 130-150 due to BA stenosis Avoid low BP due to BA stensois  Hyperlipidemia  Home meds:  No statin  LDL 141, goal < 70  Hx of lipitor allergy  On low dose pravastatin  DM  A1C 7.3, goal < 7.0  SSI  CBG monitoring  Other Stroke Risk Factors  Advanced age  Overweight, Body mass index is 28.53 kg/m., recommend weight loss, diet and exercise as appropriate   Coronary artery disease - s/p PTCA  PVD s/p fem pop bypass  Other Active Problems  CKD  Hx PE  Tooth extraction - follow up with dentist for antiplatelet management perioperatively  Hospital day # 5   Rosalin Hawking, MD PhD Stroke Neurology 10/26/2015 6:28 PM    To contact Stroke Continuity provider, please refer to [email or web address] After hours, contact General Neurology

## 2015-10-26 NOTE — Progress Notes (Signed)
Speech Language Pathology Daily Session Note  Patient Details  Name: [name] MRN: [id number] Date of Birth: 10-01-1927  Today's Date: 10/26/2015 SLP Individual Time: 1030-1130 SLP Individual Time Calculation (min): 60 min   Short Term Goals: Week 1: SLP Short Term Goal 1 (Week 1): Pt will demonstrate efficient mastication and oral clearance of regular textures over 3 observed sessions to demonstrate readiness for diet advancement.  SLP Short Term Goal 2 (Week 1): Pt will demonstrate intellectual awareness within functional tasks with Mod A cues.  SLP Short Term Goal 3 (Week 1): Pt will utilize speech intelligibility strategies at the simple conversation level with Min A verbal cues. to achieve greater than 90% intelligibility.  SLP Short Term Goal 4 (Week 1): Pt will demonstrate functional problem solving for mildly complex tasks with Mod A verbal cues.   Skilled Therapeutic Interventions:   Skilled treatment session focused on addressing dysphagia and dysarthria goals. SLP facilitated session by providing set-up assist and skilled observation of Dys.1 textures and thin liquids via straw. Patient consumed trials without overt s/s of aspiration but reported intermittent globus sensation SLP then provided Mod verbal cues to alternate bites and sips which appeared to effectively reduced reports.  Patient also required Mod verbal cues for increased vocal intensity, which lead to improved speech intelligibility about 70% in conversation.  Daughter at bedside at end of session.  Continue with current plan of care.   Function:  Eating Eating   Modified Consistency Diet: Yes Eating Assist Level: Supervision or verbal cues   Eating Set Up Assist For: Opening containers       Cognition Comprehension Comprehension assist level: Understands basic 90% of the time/cues < 10% of the time  Expression   Expression assist level: Expresses basic 50 - 74% of the time/requires cueing 25 - 49% of the  time. Needs to repeat parts of sentences.  Social Interaction Social Interaction assist level: Interacts appropriately 75 - 89% of the time - Needs redirection for appropriate language or to initiate interaction.  Problem Solving Problem solving assist level: Solves basic 75 - 89% of the time/requires cueing 10 - 24% of the time  Memory Memory assist level: Recognizes or recalls 75 - 89% of the time/requires cueing 10 - 24% of the time    Pain Pain Assessment Pain Assessment: No/denies pain  Therapy/Group: Individual Therapy  Carmelia Roller., [place] [id number]  [place] 10/26/2015, 12:58 PM

## 2015-10-26 NOTE — Progress Notes (Addendum)
Physical Therapy Note  Patient Details  Name: Royd Dewing MRN: [id number] Date of Birth: 1927-03-23 Today's Date: 10/26/2015  1300- 20 individual tx  Pain: none reported; mild nausea; RN informed  Pt somnolent, but easily aroused.  He c/o nausea and being "worn out". Pt had difficulty keeping track of  counting repetitions, alternating R/LE movements, and fully attending to RUE.  Neuromuscular re-education via manual and VCS for coordination, eccentric control and isolated movement RLE supine 10 x 1 each R straight leg raises, R ankle pumps, R hip abd with knee flex> hip adduction with knee extension. Lower trunk rotation x 20 cycles.  Bed moiblity to come to sitting, using railing.  R lateral leans for RUE wt bearing and coordination. Pt sat EOB x 15 minutes, and performed 10 x 1 each long arc R/L quad knee ext, toe raises, heel raises, marching.  Using bil hands on stable object, pt lifted hips x 10 with min guard assist. Pt fatigued, assisted to lie down and rest.  Pt left resting in bed with bed alarm set.  Friend in room.    Jolly Carlini 10/26/2015, 7:58 AM

## 2015-10-27 ENCOUNTER — Inpatient Hospital Stay (HOSPITAL_COMMUNITY): Payer: Medicare Other | Admitting: Physical Therapy

## 2015-10-27 ENCOUNTER — Inpatient Hospital Stay (HOSPITAL_COMMUNITY): Payer: Medicare Other | Admitting: Speech Pathology

## 2015-10-27 ENCOUNTER — Inpatient Hospital Stay (HOSPITAL_COMMUNITY): Payer: Medicare Other | Admitting: Occupational Therapy

## 2015-10-27 DIAGNOSIS — R3911 Hesitancy of micturition: Secondary | ICD-10-CM

## 2015-10-27 DIAGNOSIS — N183 Chronic kidney disease, stage 3 (moderate): Secondary | ICD-10-CM

## 2015-10-27 LAB — GLUCOSE, CAPILLARY
GLUCOSE-CAPILLARY: 178 mg/dL — AB (ref 65–99)
Glucose-Capillary: 218 mg/dL — ABNORMAL HIGH (ref 65–99)
Glucose-Capillary: 325 mg/dL — ABNORMAL HIGH (ref 65–99)
Glucose-Capillary: 200 mg/dL — ABNORMAL HIGH (ref 65–99)

## 2015-10-27 LAB — URINALYSIS, ROUTINE W REFLEX MICROSCOPIC
BILIRUBIN URINE: NEGATIVE
GLUCOSE, UA: 100 mg/dL — AB
HGB URINE DIPSTICK: NEGATIVE
KETONES UR: NEGATIVE mg/dL
Leukocytes, UA: NEGATIVE
Nitrite: NEGATIVE
PROTEIN: NEGATIVE mg/dL
Specific Gravity, Urine: 1.021 (ref 1.005–1.030)
pH: 6 (ref 5.0–8.0)

## 2015-10-27 LAB — OCCULT BLOOD X 1 CARD TO LAB, STOOL: Fecal Occult Bld: NEGATIVE

## 2015-10-27 MED ORDER — LIDOCAINE HCL 2 % EX GEL
CUTANEOUS | Status: DC | PRN
  Filled 2015-10-27: qty 5

## 2015-10-27 NOTE — Progress Notes (Signed)
Physical Therapy Session Note  Patient Details  Name: [name] MRN: [id number] Date of Birth: 1927/07/12  Today's Date: 10/27/2015 PT Individual Time: 0800-0853 PT Individual Time Calculation (min): 53 min    Short Term Goals: Week 1:  PT Short Term Goal 1 (Week 1): pt will transfer with min assist consistently PT Short Term Goal 2 (Week 1): pt will propel w/c x 150' with supervision PT Short Term Goal 3 (Week 1): pt will perform gait x 150' with min assist, LRAD PT Short Term Goal 4 (Week 1): pt will ascend and descend 12 steps 2 rails, min assist  Skilled Therapeutic Interventions/Progress Updates:    Patient in bed upon arrival, reporting chest pains earlier this AM, nausea, fatigue, "not feeling right," and c/o visual impairments including diplopia and R eye "not as strong as yesterday." RN made aware and seated BP checked manually 190/78. Patient transferred supine > sit with steady assist using rail and sat edge of bed with supervision x 20 min to consume 10% of cereal and drink a few sips of coffee. Patient performed stand pivot transfer bed <> wheelchair with mod A and wheelchair <> toilet and sit <> stand with min A using grab bar. Patient unable to urinate in sitting or standing position but continued to report urgency, RN made aware and bladder scanned. Sitting in wheelchair, patient with worsening nausea and dry heaving. Patient transferred sit > supine with supervision and left semi reclined in bed with RN present. Patient missed 37 min due to nausea and need for catheterization.   Therapy Documentation Precautions:  Precautions Precautions: Fall Precaution Comments: right hemiparesis with increased pain in the left wrist and hand secondary to gout Restrictions Weight Bearing Restrictions: No General: PT Amount of Missed Time (min): 37 Minutes PT Missed Treatment Reason: Nursing care Vital Signs: Therapy Vitals BP: (!) 190/78 (RN Notified) Patient Position (if  appropriate): Sitting Pain: Pain Assessment Pain Assessment: No/denies pain   See Function Navigator for Current Functional Status.   Therapy/Group: Individual Therapy  Laretta Alstrom 10/27/2015, 9:06 AM

## 2015-10-27 NOTE — Progress Notes (Signed)
STROKE TEAM PROGRESS NOTE   SUBJECTIVE (INTERVAL HISTORY) Pt lying in bed. Daughter is at bedside. Pt seems very depressed. Answering questions with eyes closed, stated that he feels that he is dying. I spent fairly amount of time explain to him about his stroke and they are small and he likely to have full recovery. I encourage him to work with PT/OT/speech actively and aggressively. Will ask cardiology to implant loop and pt and daughter agreed.    OBJECTIVE Temp:  [97.6 F (36.4 C)-98.6 F (37 C)] 97.6 F (36.4 C) (08/29 1024) Pulse Rate:  [85-96] 85 (08/29 1024) Resp:  [20] 20 (08/28 1437) BP: (170-195)/(60-80) 195/63 (08/29 1024) SpO2:  [96 %] 96 % (08/29 1024)  CBC:   Recent Labs Lab 10/25/15 1055 10/26/15 0528  WBC 11.7* 12.0*  NEUTROABS 9.2* 7.8*  HGB 15.5 15.1  HCT 45.2 45.6  MCV 90.2 90.3  PLT 200 [id number]    Basic Metabolic Panel:   Recent Labs Lab 10/25/15 1055 10/26/15 0528  NA 136 135  K 3.9 3.9  CL 104 103  CO2 21* 23  GLUCOSE 168* 134*  BUN 29* 30*  CREATININE 1.68* 1.57*  CALCIUM 9.4 9.4    Lipid Panel:     Component Value Date/Time   CHOL 207 (H) 10/17/2015 0551   TRIG 159 (H) 10/17/2015 0551   HDL 34 (L) 10/17/2015 0551   CHOLHDL 6.1 10/17/2015 0551   VLDL 32 10/17/2015 0551   LDLCALC 141 (H) 10/17/2015 0551   HgbA1c:  Lab Results  Component Value Date   HGBA1C 7.3 (H) 10/17/2015   Urine Drug Screen:     Component Value Date/Time   LABOPIA NONE DETECTED 10/14/2015 1250   COCAINSCRNUR NONE DETECTED 10/14/2015 1250   LABBENZ NONE DETECTED 10/14/2015 1250   AMPHETMU NONE DETECTED 10/14/2015 1250   THCU NONE DETECTED 10/14/2015 1250   LABBARB NONE DETECTED 10/14/2015 1250      IMAGING I have personally reviewed the radiological images below and agree with the radiology interpretations.  Mr Brain Wo Contrast 10/25/15 1. Interval increase in size of the left pontine infarct primarily in the coronal plane with extension toward  cerebral peduncle and medulla. 2. New 11 mm acute infarct within left posterior lateral temporal lobe.  10/17/15, limited  Moderate worsening of acute LEFT paramedian pontine infarction as compared with 10/14/2015.  10/14/2015 Acute infarct in the left pons. Generalized atrophy with mild chronic microvascular ischemia in the white matter.   Mr Jodene Nam Head/brain Wo Cm 10/14/2015 1. Moderate narrowing of the midportion of the basilar artery, likely secondary to atherosclerotic disease. 2. Multifocal moderate to severe narrowing of the intracranial internal carotid arteries, left worse than right. 3. Possible 1.5 mm aneurysm versus atherosclerotic irregularity at the left posterior communicating artery origin.   Carotid Doppler   There is 1-39% bilateral ICA stenosis. Vertebral artery flow is antegrade.    TTE - Left ventricle: The cavity size was normal. Systolic function was normal. The estimated ejection fraction was in the range of 60% to 65%. Wall motion was normal; there were no regional wall motionbnormalities. Doppler parameters are consistent with abnormal left ventricular relaxation (grade 1 diastolic dysfunction). Doppler parameters are consistent with indeterminate ventricular filling pressure. - Aortic valve: Transvalvular velocity was within the normal range. There was no stenosis. There was mild regurgitation. - Mitral valve: Transvalvular velocity was within the normal range. There was no evidence for stenosis. There was mild regurgitation. - Right ventricle: The cavity size was normal.  Wall thickness was normal. Systolic function was normal. - Atrial septum: No defect or patent foramen ovale was identified by color flow Doppler. - Tricuspid valve: There was no regurgitation.  Ct Head Code Stroke Wo Contrast 10/16/2015 1. Expected CT appearance of the left paracentral brainstem infarct seen by MRI 2 days ago. No associated hemorrhage or mass effect. 2. No superimposed acute  cortically based infarct or new intracranial abnormality. 3. ASPECTS is 10.   CT angio Head and Neck 10/16/2015 1. Negative for intracranial emergent large vessel occlusion. However, the left vertebral artery is severely atherosclerotic throughout its course and is intermittently occluded in the left V4 segment. And there is moderate to severe irregularity and stenosis of the proximal basilar artery which appears somewhat more pronounced than on the MRA yesterday. 2. Additionally there is moderate to severe intracranial atherosclerosis of the bilateral ICA siphons and right PCA P2 segment. 3. Abundant soft plaque in both cervical carotid arteries, with some areas of low-density soft plaque noted. Stenosis up to 55% occurs. 4. Mild to moderate stenosis of the origin of the dominant right vertebral artery.  Ct Head Wo Contrast  10/25/2015 1. Increased prominence of LEFT paramedian pontine infarction compared to CT of 10/16/15 and similar volume to MRI of 10/17/2015. 2. No evidence of new cortical infarction or brainstem infarction. 3. Chronic atrophy ventricular dilatation again noted.     PHYSICAL EXAM Temp:  [97.6 F (36.4 C)-98.6 F (37 C)] 97.6 F (36.4 C) (08/29 1024) Pulse Rate:  [85-96] 85 (08/29 1024) Resp:  [20] 20 (08/28 1437) BP: (170-195)/(60-80) 195/63 (08/29 1024) SpO2:  [96 %] 96 % (08/29 1024)  General - Well nourished, well developed, in depressed mood.  Cardiovascular - Regular rate and rhythm.  Mental Status -  Level of arousal and orientation to time, place, and person were intact. Language including expression, naming, repetition, comprehension was assessed and found intact, however, mild to moderate dysarthria. Fund of Knowledge was assessed and was intact.  Cranial Nerves II - XII - II - Visual field intact OU. III, IV, VI - Extraocular movements intact. V - Facial sensation intact bilaterally. VII - right nasolabial fold flattening. VIII - Hearing & vestibular  intact bilaterally. X - Palate elevates symmetrically. XI - Chin turning & shoulder shrug intact bilaterally XII - Tongue protrusion intact.  Motor Strength - The patient's strength was normal in all extremities except subtle right dexterity difficulty and RLE 5-/5 proximal and pronator drift was absent.  Bulk was normal and fasciculations were absent.   Motor Tone - Muscle tone was assessed at the neck and appendages and was normal.  Reflexes - The patient's reflexes were 1+ in all extremities and he had no pathological reflexes.  Sensory - Light touch, temperature/pinprick were assessed and were symmetrical.    Coordination - The patient had normal movements in the hands with no ataxia or dysmetria.  Tremor was absent.  Gait and Station - not tested as pt in depressed mood.   ASSESSMENT/PLAN [name] is a 82 y.o. male with history of hypertension, coronary artery disease, chronic kidney disease, type 2 diabetes mellitus presenting with trouble talking. He did not receive IV t-PA due to unknown onset, LKW.   Stroke:  Acute left MCA small cortical infarct and subacute Left pontine infarct secondary to small vessel disease vs. cardioembolic  Resultant  Right facial droop and right hand subtle dexterity difficulty  MRI 10/14/15 L pontine infarct  MRA  Moderate narrowing mid BA. L >  R narrowing intracranial arteries. 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  Carotid Doppler  No significant stenosis   2D Echo  EF 60-65%  Not candidate for TEE due to hx of esophagus fissure s/p repair.   [facility] EP to consult and consider Loop placement for tomorrow  LDL 141  HgbA1c 7.3  lovenox for VTE prophylaxis DIET DYS 3 Room service appropriate? Yes; Fluid consistency: Thin  aspirin 81 mg daily prior to admission, now on DAPT. Continue DAPT for 3 months. And then  plavix alone  Patient counseled to be compliant with his antithrombotic medications  Ongoing aggressive stroke risk factor management  Depression   Pt depressed mood, negative thinking  On prozac  May need outpt psychiatry and psychology follow up for post stroke depression  Hx of SVT and cardiac pause  Followed with Dr. Tamala Julian in cardiology in the past  Treated with IV injection  Valsalva helped to abort episodes  Do not feel needs further cardiac event monitoring as current stroke likely small vessel event and hx of SVT pretty typical.  During admission 10/19/15 pt had one episode of cardiac pause followed by bradycardia. Pt was sent to stepdown unit for observation, discontinued beta blocker. No PPM needed.  Cardiology EP consulted and consider loop tomorrow.  Leukocytosis   Pt complains of hematuria  UA negative  CXR not remarkable  WBC 8.4->9.4->11.7->12.0  Afebrile   CBC monitoring  Hypertension  Remains elevated  On amlodipine 5mg , clonidine PRN, hydralazine 100mg  Q6h  Long-term BP goal 130-150 due to BA stenosis  Avoid low BP due to BA stensois  Hyperlipidemia  Home meds:  No statin  LDL 141, goal < 70  Hx of lipitor allergy  On low dose pravastatin  DM  A1C 7.3, goal < 7.0  SSI  CBG monitoring  Other Stroke Risk Factors  Advanced age  Overweight, Body mass index is 28.53 kg/m., recommend weight loss, diet and exercise as appropriate   Coronary artery disease - s/p PTCA  PVD s/p fem pop bypass  Other Active Problems  CKD  Hx PE  Tooth extraction - follow up with dentist for antiplatelet management perioperatively  Hospital day # 6  Neurology will sign off. Please call with questions. Pt will follow up with [name] at [facility] in about 2 months. Thanks for the consult.  Rosalin Hawking, MD PhD Stroke Neurology 10/27/2015 8:32 PM      To contact Stroke Continuity provider, please refer to [email or web address] After hours, contact  General Neurology

## 2015-10-27 NOTE — Progress Notes (Signed)
Speech Language Pathology Daily Session Note  Patient Details  Name: Tranell Gutzmer MRN: [id number] Date of Birth: [birth year]  Today's Date: 10/27/2015 SLP Individual Time: [id number] (make up session )-1555 SLP Individual Time Calculation (min): 45 min   Short Term Goals: Week 1: SLP Short Term Goal 1 (Week 1): Pt will demonstrate efficient mastication and oral clearance of regular textures over 3 observed sessions to demonstrate readiness for diet advancement.  SLP Short Term Goal 2 (Week 1): Pt will demonstrate intellectual awareness within functional tasks with Mod A cues.  SLP Short Term Goal 3 (Week 1): Pt will utilize speech intelligibility strategies at the simple conversation level with Min A verbal cues. to achieve greater than 90% intelligibility.  SLP Short Term Goal 4 (Week 1): Pt will demonstrate functional problem solving for mildly complex tasks with Mod A verbal cues.   Skilled Therapeutic Interventions:   Skilled treatment session focused on addressing cognition goals. SLP facilitated session by providing Max assist verbal and demonstrative cues to provide concrete examples of current deficits, due to patient over stating current limitations. Patient verbally perseverated on events that have occurred earlier in his hospital stay and attempted to re-tell this clinician the same story 3 times during the session.  Verbal cues to redirect patient to move on were effective along with the introduction to a new topic.  Patient required Mod question cues to verbally solve basic problems.  Daughter present and asked appropriate questions regarding current deficits and how she can also help him progress.  Continue with current plan of care.    Function:  Eating Eating   Modified Consistency Diet: Yes Eating Assist Level: Set up assist for;Supervision or verbal cues   Eating Set Up Assist For: Opening containers       Cognition Comprehension Comprehension assist level: Understands basic  90% of the time/cues < 10% of the time  Expression   Expression assist level: Expresses basic 75 - 89% of the time/requires cueing 10 - 24% of the time. Needs helper to occlude trach/needs to repeat words.  Social Interaction Social Interaction assist level: Interacts appropriately 75 - 89% of the time - Needs redirection for appropriate language or to initiate interaction.  Problem Solving Problem solving assist level: Solves basic 50 - 74% of the time/requires cueing 25 - 49% of the time  Memory Memory assist level: Recognizes or recalls 50 - 74% of the time/requires cueing 25 - 49% of the time    Pain Pain Assessment Pain Assessment: No/denies pain  Therapy/Group: Individual Therapy  Carmelia Roller., CCC-SLP [id number]  [place] 10/27/2015, 4:51 PM

## 2015-10-27 NOTE — Progress Notes (Signed)
Occupational Therapy Session Note  Patient Details  Name: [name] MRN: [id number] Date of Birth: [birth year]  Today's Date: 10/27/2015 OT Individual Time: 1103-1200 OT Individual Time Calculation (min): 57 min     Short Term Goals: Week 1:  OT Short Term Goal 1 (Week 1): Pt will transfer to the elevated toilet with min assist using LRAD. OT Short Term Goal 2 (Week 1): Pt will complete UB dressing with supervision for pullover shirt. OT Short Term Goal 3 (Week 1): Pt will complete LB dressing with min assist using AE PRN. OT Short Term Goal 4 (Week 1): Pt will use the RUE for bathing tasks with no more than min assist.  OT Short Term Goal 5 (Week 1): Pt will complete all bathing with no more than min assist.    Skilled Therapeutic Interventions/Progress Updates:    Pt completed shower and dressing during session.  BP taken in initial sitting at 174/71.  Pt reporting severe nausea this session and noted to be more lethargic at times and keeping eyes closed.  Reported some intermittent double vision but was correct 4/4 times on the number of fingers that therapist was holding up.  He needed mod assist to ambulate to the shower for bathing.  He was able to bathe sit to stand with min assist, using the RUE with close supervision for washing the left arm.  Decreased ability to bring his LEs up for washing this session with therapist providing assist.  He ambulated back out to the EOB for dressing but was so nauseated he did not assist as much as usual.  Pt competed transfer back into the bed with min assist.  Nursing notified about nausea and pt's daughter present for session as well but did not observe bathing.   Therapy Documentation Precautions:  Precautions Precautions: Fall Precaution Comments: right hemiparesis with increased pain in the left wrist and hand secondary to gout Restrictions Weight Bearing Restrictions: No   Pain: Pain Assessment Pain Assessment: No/denies pain ADL: See  Function Navigator for Current Functional Status.   Therapy/Group: Individual Therapy  Rheana Casebolt OTR/L 10/27/2015, 12:58 PM

## 2015-10-27 NOTE — Progress Notes (Signed)
Speech Language Pathology Daily Session Note  Patient Details  Name: [name] MRN: [id number] Date of Birth: [birth year]  Today's Date: 10/27/2015 SLP Individual Time: 1405-1500 SLP Individual Time Calculation (min): 55 min   Short Term Goals: Week 1: SLP Short Term Goal 1 (Week 1): Pt will demonstrate efficient mastication and oral clearance of regular textures over 3 observed sessions to demonstrate readiness for diet advancement.  SLP Short Term Goal 2 (Week 1): Pt will demonstrate intellectual awareness within functional tasks with Mod A cues.  SLP Short Term Goal 3 (Week 1): Pt will utilize speech intelligibility strategies at the simple conversation level with Min A verbal cues. to achieve greater than 90% intelligibility.  SLP Short Term Goal 4 (Week 1): Pt will demonstrate functional problem solving for mildly complex tasks with Mod A verbal cues.   Skilled Therapeutic Interventions: Pt was seen for skilled ST targeting communication goals.  Therapist facilitated the session with a verbal description task with use of visual barrier targeting intelligibility in conversations.  Pt required supervision verbal cues to slow rate and increase vocal intensity to achieve intelligibility in conversations.  Pt was returned to room and left with nursing and family at bedside.  Continue per current plan of care.    Function:  Eating Eating              Cognition Comprehension Comprehension assist level: Understands basic 90% of the time/cues < 10% of the time  Expression   Expression assist level: Expresses basic 75 - 89% of the time/requires cueing 10 - 24% of the time. Needs helper to occlude trach/needs to repeat words.  Social Interaction Social Interaction assist level: Interacts appropriately 75 - 89% of the time - Needs redirection for appropriate language or to initiate interaction.  Problem Solving Problem solving assist level: Solves basic 50 - 74% of the time/requires cueing  25 - 49% of the time  Memory Memory assist level: Recognizes or recalls 50 - 74% of the time/requires cueing 25 - 49% of the time    Pain Pain Assessment Pain Assessment: No/denies pain  Therapy/Group: Individual Therapy  Jolie Strohecker, Selinda Or[place] 10/27/2015, 4:16 PM

## 2015-10-27 NOTE — Progress Notes (Signed)
Subjective/Complaints: Pt laying in bed this AM.  Daughter at bedside.  He states he feels weak and has a heavy hand.  ROS-  +Right hand weakness, heaviness. Denies CP,SOB, Diarrhea  Objective: Vital Signs: Blood pressure (!) 190/78, pulse 88, temperature 97.7 F (36.5 C), temperature source Oral, resp. rate 20, height 5' 8" (1.727 m), weight 85.1 kg (187 lb 9.8 oz), SpO2 96 %. Ct Head Wo Contrast  Result Date: 10/25/2015 CLINICAL DATA:  New onset right side weakness, and unable to find his words EXAM: CT HEAD WITHOUT CONTRAST TECHNIQUE: Contiguous axial images were obtained from the base of the skull through the vertex without intravenous contrast. COMPARISON:  Brain MRI 10/17/2015 FINDINGS: Brain: Hypodensity in the LEFT pons measures 11 mm x 10 mm similar to 16 mm x 11 mm on MRI of 10/17/2015. Lesion is expanded by CT imaging at same time. No new or cortical infarction or brainstem infarction identified. No intracranial hemorrhage. The ventricles are dilated similar prior no portion cortical atrophy. Vascular: Vascular calcification of the circle Willis vessels. Skull: Unremarkable Sinuses/Orbits: Paranasal sinuses and mastoid air cells are clear. Orbits are clear. Other: None IMPRESSION: 1. Increased prominence of LEFT paramedian pontine infarction compared to CT of 10/16/15 and similar volume to MRI of 10/17/2015. 2. No evidence of new cortical infarction or brainstem infarction. 3. Chronic atrophy ventricular dilatation again noted. Electronically Signed   By: Suzy Bouchard M.D.   On: 10/25/2015 13:57   Dg Chest Port 1 View  Result Date: 10/25/2015 CLINICAL DATA:  Leukocytosis EXAM: PORTABLE CHEST 1 VIEW COMPARISON:  05/12/2016 FINDINGS: The heart size and mediastinal contours are within normal limits. Both lungs are clear. The visualized skeletal structures are unremarkable. IMPRESSION: No active disease. Electronically Signed   By: Kerby Moors M.D.   On: 10/25/2015 13:14   Mr Brain  Ltd W/o Cm  Result Date: 10/25/2015 CLINICAL DATA:  82 y/o M; increased slurred speech and right leg weakness with concern for new stroke. EXAM: MRI HEAD WITHOUT CONTRAST TECHNIQUE: Axial and coronal DWI sequences were performed with ADC. COMPARISON:  CT head 10/25/2015.  MR brain 10/17/2015. FINDINGS: Left paramedian pontine infarct is moderately increased in size primarily in the coronal plane greater extrusion towards cerebral peduncle and medulla. There is mild increase mass effect associated with the pontine infarct as well. Addition, there is a new focus of diffusion restriction within the left posterior lateral temporal lobe measuring 11 mm in compatible with interval acute infarct. IMPRESSION: 1. Interval increase in size of the left pontine infarct primarily in the coronal plane with extension toward cerebral peduncle and medulla. 2. New 11 mm acute infarct within left posterior lateral temporal lobe. These results will be called to the ordering clinician or representative by the Radiologist Assistant, and communication documented in the PACS or zVision Dashboard. Electronically Signed   By: Kristine Garbe M.D.   On: 10/25/2015 18:27   Results for orders placed or performed during the hospital encounter of 10/21/15 (from the past 72 hour(s))  Glucose, capillary     Status: Abnormal   Collection Time: 10/24/15 11:39 AM  Result Value Ref Range   Glucose-Capillary 183 (H) 65 - 99 mg/dL   Comment 1 Notify RN   Glucose, capillary     Status: Abnormal   Collection Time: 10/24/15  4:25 PM  Result Value Ref Range   Glucose-Capillary 221 (H) 65 - 99 mg/dL  Glucose, capillary     Status: Abnormal   Collection Time: 10/25/15  2:46 AM  Result Value Ref Range   Glucose-Capillary 163 (H) 65 - 99 mg/dL   Comment 1 Notify RN   Glucose, capillary     Status: Abnormal   Collection Time: 10/25/15  6:44 AM  Result Value Ref Range   Glucose-Capillary 117 (H) 65 - 99 mg/dL   Comment 1 Notify RN    Basic metabolic panel     Status: Abnormal   Collection Time: 10/25/15 10:55 AM  Result Value Ref Range   Sodium 136 135 - 145 mmol/L   Potassium 3.9 3.5 - 5.1 mmol/L   Chloride 104 101 - 111 mmol/L   CO2 21 (L) 22 - 32 mmol/L   Glucose, Bld 168 (H) 65 - 99 mg/dL   BUN 29 (H) 6 - 20 mg/dL   Creatinine, Ser 1.68 (H) 0.61 - 1.24 mg/dL   Calcium 9.4 8.9 - 10.3 mg/dL   GFR calc non Af Amer 35 (L) >60 mL/min   GFR calc Af Amer 41 (L) >60 mL/min    Comment: (NOTE) The eGFR has been calculated using the CKD EPI equation. This calculation has not been validated in all clinical situations. eGFR's persistently <60 mL/min signify possible Chronic Kidney Disease.    Anion gap 11 5 - 15  CBC with Differential/Platelet     Status: Abnormal   Collection Time: 10/25/15 10:55 AM  Result Value Ref Range   WBC 11.7 (H) 4.0 - 10.5 K/uL   RBC 5.01 4.22 - 5.81 MIL/uL   Hemoglobin 15.5 13.0 - 17.0 g/dL   HCT 45.2 39.0 - 52.0 %   MCV 90.2 78.0 - 100.0 fL   MCH 30.9 26.0 - 34.0 pg   MCHC 34.3 30.0 - 36.0 g/dL   RDW 14.0 11.5 - 15.5 %   Platelets 200 150 - 400 K/uL   Neutrophils Relative % 79 %   Neutro Abs 9.2 (H) 1.7 - 7.7 K/uL   Lymphocytes Relative 10 %   Lymphs Abs 1.2 0.7 - 4.0 K/uL   Monocytes Relative 10 %   Monocytes Absolute 1.2 (H) 0.1 - 1.0 K/uL   Eosinophils Relative 1 %   Eosinophils Absolute 0.1 0.0 - 0.7 K/uL   Basophils Relative 0 %   Basophils Absolute 0.0 0.0 - 0.1 K/uL  Glucose, capillary     Status: Abnormal   Collection Time: 10/25/15 11:40 AM  Result Value Ref Range   Glucose-Capillary 189 (H) 65 - 99 mg/dL   Comment 1 Notify RN   Glucose, capillary     Status: Abnormal   Collection Time: 10/25/15  4:31 PM  Result Value Ref Range   Glucose-Capillary 333 (H) 65 - 99 mg/dL   Comment 1 Notify RN   Urine culture     Status: Abnormal   Collection Time: 10/25/15  5:12 PM  Result Value Ref Range   Specimen Description URINE, RANDOM    Special Requests NONE    Culture  MULTIPLE SPECIES PRESENT, SUGGEST RECOLLECTION (A)    Report Status 10/26/2015 FINAL   Urinalysis, Routine w reflex microscopic (not at [facility])     Status: Abnormal   Collection Time: 10/25/15  5:12 PM  Result Value Ref Range   Color, Urine YELLOW YELLOW   APPearance CLEAR CLEAR   Specific Gravity, Urine 1.023 1.005 - 1.030   pH 5.5 5.0 - 8.0   Glucose, UA 250 (A) NEGATIVE mg/dL   Hgb urine dipstick NEGATIVE NEGATIVE   Bilirubin Urine NEGATIVE NEGATIVE   Ketones, ur NEGATIVE  NEGATIVE mg/dL   Protein, ur NEGATIVE NEGATIVE mg/dL   Nitrite NEGATIVE NEGATIVE   Leukocytes, UA NEGATIVE NEGATIVE    Comment: MICROSCOPIC NOT DONE ON URINES WITH NEGATIVE PROTEIN, BLOOD, LEUKOCYTES, NITRITE, OR GLUCOSE <1000 mg/dL.  Glucose, capillary     Status: Abnormal   Collection Time: 10/25/15  9:10 PM  Result Value Ref Range   Glucose-Capillary 259 (H) 65 - 99 mg/dL  CBC with Differential/Platelet     Status: Abnormal   Collection Time: 10/26/15  5:28 AM  Result Value Ref Range   WBC 12.0 (H) 4.0 - 10.5 K/uL   RBC 5.05 4.22 - 5.81 MIL/uL   Hemoglobin 15.1 13.0 - 17.0 g/dL   HCT 02.4 09.7 - 35.3 %   MCV 90.3 78.0 - 100.0 fL   MCH 29.9 26.0 - 34.0 pg   MCHC 33.1 30.0 - 36.0 g/dL   RDW 29.9 24.2 - 68.3 %   Platelets 215 150 - 400 K/uL   Neutrophils Relative % 65 %   Neutro Abs 7.8 (H) 1.7 - 7.7 K/uL   Lymphocytes Relative 24 %   Lymphs Abs 2.9 0.7 - 4.0 K/uL   Monocytes Relative 10 %   Monocytes Absolute 1.2 (H) 0.1 - 1.0 K/uL   Eosinophils Relative 1 %   Eosinophils Absolute 0.1 0.0 - 0.7 K/uL   Basophils Relative 0 %   Basophils Absolute 0.0 0.0 - 0.1 K/uL  Comprehensive metabolic panel     Status: Abnormal   Collection Time: 10/26/15  5:28 AM  Result Value Ref Range   Sodium 135 135 - 145 mmol/L   Potassium 3.9 3.5 - 5.1 mmol/L   Chloride 103 101 - 111 mmol/L   CO2 23 22 - 32 mmol/L   Glucose, Bld 134 (H) 65 - 99 mg/dL   BUN 30 (H) 6 - 20 mg/dL   Creatinine, Ser 4.19 (H) 0.61 - 1.24  mg/dL   Calcium 9.4 8.9 - 62.2 mg/dL   Total Protein 6.6 6.5 - 8.1 g/dL   Albumin 3.2 (L) 3.5 - 5.0 g/dL   AST 35 15 - 41 U/L   ALT 62 17 - 63 U/L   Alkaline Phosphatase 44 38 - 126 U/L   Total Bilirubin 0.6 0.3 - 1.2 mg/dL   GFR calc non Af Amer 38 (L) >60 mL/min   GFR calc Af Amer 44 (L) >60 mL/min    Comment: (NOTE) The eGFR has been calculated using the CKD EPI equation. This calculation has not been validated in all clinical situations. eGFR's persistently <60 mL/min signify possible Chronic Kidney Disease.    Anion gap 9 5 - 15  Glucose, capillary     Status: Abnormal   Collection Time: 10/26/15  6:43 AM  Result Value Ref Range   Glucose-Capillary 150 (H) 65 - 99 mg/dL  Occult blood card to lab, stool RN will collect     Status: None   Collection Time: 10/26/15  8:53 AM  Result Value Ref Range   Fecal Occult Bld NEGATIVE NEGATIVE  Glucose, capillary     Status: Abnormal   Collection Time: 10/26/15 11:35 AM  Result Value Ref Range   Glucose-Capillary 251 (H) 65 - 99 mg/dL  Glucose, capillary     Status: Abnormal   Collection Time: 10/26/15  4:56 PM  Result Value Ref Range   Glucose-Capillary 250 (H) 65 - 99 mg/dL  Glucose, capillary     Status: Abnormal   Collection Time: 10/26/15  8:37 PM  Result  Value Ref Range   Glucose-Capillary 250 (H) 65 - 99 mg/dL  Occult blood card to lab, stool RN will collect     Status: None   Collection Time: 10/27/15 12:14 AM  Result Value Ref Range   Fecal Occult Bld NEGATIVE NEGATIVE  Glucose, capillary     Status: Abnormal   Collection Time: 10/27/15  6:30 AM  Result Value Ref Range   Glucose-Capillary 178 (H) 65 - 99 mg/dL  Urinalysis, Routine w reflex microscopic (not at [facility])     Status: Abnormal   Collection Time: 10/27/15  9:22 AM  Result Value Ref Range   Color, Urine YELLOW YELLOW   APPearance CLEAR CLEAR   Specific Gravity, Urine 1.021 1.005 - 1.030   pH 6.0 5.0 - 8.0   Glucose, UA 100 (A) NEGATIVE mg/dL   Hgb urine  dipstick NEGATIVE NEGATIVE   Bilirubin Urine NEGATIVE NEGATIVE   Ketones, ur NEGATIVE NEGATIVE mg/dL   Protein, ur NEGATIVE NEGATIVE mg/dL   Nitrite NEGATIVE NEGATIVE   Leukocytes, UA NEGATIVE NEGATIVE    Comment: MICROSCOPIC NOT DONE ON URINES WITH NEGATIVE PROTEIN, BLOOD, LEUKOCYTES, NITRITE, OR GLUCOSE <1000 mg/dL.     HEENT: normocephalic, atraumatic.  Cardio: RRR and no murmur Resp: CTA B/L and unlabored GI: BS positive and NT, ND Skin:   Intact. Warm and dry. Neuro: Alert/Oriented Motor 4/5 R delt, bi, tri, grip. Apraxia. Decreased FMC. 4+/5 R HF, KE ,4+/5 ADF/PF  LUE/LLE: 4+/5 proximal to distal Dysarthria Musc/Skel:  No edema. No erythema. Gen: NAD. Vitals signs reviewed.    Assessment/Plan: 1. Functional deficits secondary to Left pontine infarct and new left posterior lateral temporal infarct which require 3+ hours per day of interdisciplinary therapy in a comprehensive inpatient rehab setting. Physiatrist is providing close team supervision and 24 hour management of active medical problems listed below. Physiatrist and rehab team continue to assess barriers to discharge/monitor patient progress toward functional and medical goals. FIM: Function - Bathing Position: Sitting EOB Body parts bathed by patient: Right arm, Chest, Abdomen, Front perineal area, Buttocks, Right upper leg, Left upper leg Body parts bathed by helper: Right lower leg, Left lower leg, Back Assist Level: Touching or steadying assistance(Pt > 75%)  Function- Upper Body Dressing/Undressing What is the patient wearing?: Pull over shirt/dress Pull over shirt/dress - Perfomed by patient: Thread/unthread right sleeve, Thread/unthread left sleeve, Put head through opening, Pull shirt over trunk Pull over shirt/dress - Perfomed by helper: Pull shirt over trunk, Thread/unthread right sleeve Assist Level: Supervision or verbal cues Function - Lower Body Dressing/Undressing What is the patient wearing?:  Non-skid slipper socks, Pants Position: Sitting EOB Underwear - Performed by patient: Thread/unthread left underwear leg, Pull underwear up/down Underwear - Performed by helper: Pull underwear up/down, Thread/unthread left underwear leg, Thread/unthread right underwear leg Pants- Performed by patient: Pull pants up/down Pants- Performed by helper: Thread/unthread right pants leg, Thread/unthread left pants leg, Pull pants up/down Non-skid slipper socks- Performed by helper: Don/doff right sock, Don/doff left sock TED Hose - Performed by helper: Don/doff right TED hose, Don/doff left TED hose Assist for footwear: Dependant Assist for lower body dressing: Touching or steadying assistance (Pt > 75%)  Function - Toileting Toileting activity did not occur: Refused Toileting steps completed by patient: Adjust clothing prior to toileting, Performs perineal hygiene Toileting steps completed by helper: Adjust clothing after toileting Toileting Assistive Devices: Grab bar or rail Assist level: Supervision or verbal cues  Function - Archivist transfer assistive device: Therapist, nutritional  level to toilet: Touching or steadying assistance (Pt > 75%) Assist level from toilet: Touching or steadying assistance (Pt > 75%)  Function - Chair/bed transfer Chair/bed transfer method: Stand pivot Chair/bed transfer assist level: Moderate assist (Pt 50 - 74%/lift or lower) Chair/bed transfer assistive device: Armrests Chair/bed transfer details: Manual facilitation for weight shifting, Verbal cues for precautions/safety, Verbal cues for technique, Verbal cues for sequencing  Function - Locomotion: Wheelchair Will patient use wheelchair at discharge?: Yes Type: Manual Max wheelchair distance: 200 Assist Level: Supervision or verbal cues Assist Level: Supervision or verbal cues Assist Level: Supervision or verbal cues Turns around,maneuvers to table,bed, and toilet,negotiates 3% grade,maneuvers  on rugs and over doorsills: No Function - Locomotion: Ambulation Assistive device: No device Max distance: 175 Assist level: Moderate assist (Pt 50 - 74%) Assist level: Moderate assist (Pt 50 - 74%) Walk 50 feet with 2 turns activity did not occur: Safety/medical concerns Assist level: Moderate assist (Pt 50 - 74%) Walk 150 feet activity did not occur: Safety/medical concerns Assist level: Moderate assist (Pt 50 - 74%) Walk 10 feet on uneven surfaces activity did not occur: Safety/medical concerns Assist level: Maximal assist (Pt 25 - 49%)  Function - Comprehension Comprehension: Auditory Comprehension assist level: Understands basic 90% of the time/cues < 10% of the time  Function - Expression Expression: Verbal Expression assist level: Expresses basic 50 - 74% of the time/requires cueing 25 - 49% of the time. Needs to repeat parts of sentences.  Function - Social Interaction Social Interaction assist level: Interacts appropriately 75 - 89% of the time - Needs redirection for appropriate language or to initiate interaction.  Function - Problem Solving Problem solving assist level: Solves basic 75 - 89% of the time/requires cueing 10 - 24% of the time  Function - Memory Memory assist level: Recognizes or recalls 75 - 89% of the time/requires cueing 10 - 24% of the time Patient normally able to recall (first 3 days only): Current season, That he or she is in a hospital  Medical Problem List and Plan:   1.  Right hemiparesis dysarthria/dysphagia secondary to left pontine infarct on 10/14/15 and left posterior lateral temporal infarct 8/27  Cont CIR  New stroke on 8/27, MRI reviewed with new stroke, spoke to Neurology, no changes in medication, possible Loop.  2.  DVT Prophylaxis/Anticoagulation: Subcutaneous heparin changed to lovenox. Monitor platelet count and any signs of bleeding 3. Pain Management: Tylenol as needed 4. Hypertension.   Lopressor discontinued due to  bradycardia.  Increased hydralazine 50 mg every 6 hours on 8/26, increased to 100 on 8/27  Norvasc 5 started 8/26  Will need to monitor closely, once off steroids  Will not make further changes at present and allow for permissive HTN 5. Sinus arrest due to vagal spell. Beta blocker has been discontinued for now. No additional episodes. Follow-up per cardiology services. No plan at this time for pacemaker. 6. Neuropsych: This patient is not fully capable of making decisions on his own behalf.  Started on Zoloft 8/27, changed to Prozaac on 8/28 7. Skin/Wound Care: Routine skin check 8. Fluids/Electrolytes/Nutrition: Routine I&O's with follow-up chemistries 9. Chronic renal insufficiency. Baseline creatinine 1.5.   Cr. 1.57 on 8/28  Will cont to monitor 10. Diet-controlled diabetes mellitus. Hemoglobin A1c 7.3. Check blood sugars before meals and at bedtime. Diabetic teaching.  Monitor as activity increased  Will consider scheduling medications if needed after steroids complete CBG (last 3)   Recent Labs  10/26/15 1656 10/26/15 2037 10/27/15  0630  GLUCAP 250* 250* 178*  11. CAD/SVT. No chest pain or shortness of breath.  12. Gout. Placed on colchicine 1 dose as well as prednisone 40 mg daily improving  (started 8/24), decreased to 69m on 8/29 x2 days 13. Hyperlipidemia. Pravachol 14. ?Hematuria  Per pt.  Per nursing, no blood, however, pt states it was when he began urinating  UA relatively unremarkable, Ucx pending  Hb stable 15. Subjective bleeding: No objective findings  Yesterday, pt noted to have increased bleeding after heparin injection with self-reported hematuria   UA negative  Hemoccult negative 16. Urinary hesitancy  Will check PVRs   LOS (Days) 6 A FACE TO FACE EVALUATION WAS PERFORMED  Sollie Vultaggio ALorie Phenix8/29/2017, 10:23 AM

## 2015-10-28 ENCOUNTER — Inpatient Hospital Stay (HOSPITAL_COMMUNITY): Payer: Medicare Other | Admitting: Occupational Therapy

## 2015-10-28 ENCOUNTER — Inpatient Hospital Stay (HOSPITAL_COMMUNITY): Payer: Medicare Other

## 2015-10-28 ENCOUNTER — Inpatient Hospital Stay (HOSPITAL_COMMUNITY): Payer: Medicare Other | Admitting: Speech Pathology

## 2015-10-28 DIAGNOSIS — R339 Retention of urine, unspecified: Secondary | ICD-10-CM

## 2015-10-28 DIAGNOSIS — E1149 Type 2 diabetes mellitus with other diabetic neurological complication: Secondary | ICD-10-CM

## 2015-10-28 DIAGNOSIS — D72829 Elevated white blood cell count, unspecified: Secondary | ICD-10-CM

## 2015-10-28 DIAGNOSIS — R110 Nausea: Secondary | ICD-10-CM

## 2015-10-28 LAB — GLUCOSE, CAPILLARY
GLUCOSE-CAPILLARY: 168 mg/dL — AB (ref 65–99)
GLUCOSE-CAPILLARY: 171 mg/dL — AB (ref 65–99)
GLUCOSE-CAPILLARY: 168 mg/dL — AB (ref 65–99)
Glucose-Capillary: 195 mg/dL — ABNORMAL HIGH (ref 65–99)

## 2015-10-28 LAB — URINE CULTURE: Culture: NO GROWTH

## 2015-10-28 MED ORDER — PREDNISONE 5 MG PO TABS
10.0000 mg | ORAL_TABLET | Freq: Every day | ORAL | Status: DC
  Administered 2015-10-29: 10 mg via ORAL
  Filled 2015-10-28: qty 2

## 2015-10-28 MED ORDER — POLYETHYLENE GLYCOL 3350 17 G PO PACK
17.0000 g | PACK | Freq: Two times a day (BID) | ORAL | Status: DC
  Administered 2015-10-28 – 2015-11-11 (×23): 17 g via ORAL
  Filled 2015-10-28 (×26): qty 1

## 2015-10-28 NOTE — Progress Notes (Signed)
Physical Therapy Weekly Progress Note  Patient Details  Name: [name] MRN: [id number] Date of Birth: Feb 24, 1928  Beginning of progress report period: 10/22/15 End of progress report period: 10/28/15  Today's Date: 10/28/2015 PT Individual Time: 1105-1205 PT Individual Time Calculation (min): 60 min   Patient has met 0 of 4 short term goals.  Pt had extension of L pontine CVA in last week, and has had nausea and poor tolerance of therapy.  When urged, he is willing to get OOB and participate with rest breaks PRN.    Patient continues to demonstrate the following deficits: activity tolerance, balance, motor, mobility and locomotion and therefore will continue to benefit from skilled PT intervention to enhance overall performance with activity tolerance, balance, ability to compensate for deficits, functional use of  right upper extremity and right lower extremity, attention, awareness and coordination.  Patient progressing toward long term goals. Continue plan of care.  PT Short Term Goals Week 1:  PT Short Term Goal 1 (Week 1): pt will transfer with min assist consistently PT Short Term Goal 1 - Progress (Week 1): Not met PT Short Term Goal 2 (Week 1): pt will propel w/c x 150' with supervision PT Short Term Goal 2 - Progress (Week 1): Not met PT Short Term Goal 3 (Week 1): pt will perform gait x 150' with min assist, LRAD PT Short Term Goal 3 - Progress (Week 1): Not met PT Short Term Goal 4 (Week 1): pt will ascend and descend 12 steps 2 rails, min assist PT Short Term Goal 4 - Progress (Week 1): Not met   Skilled Therapeutic Interventions/Progress Updates:pt feels nauseated; burping often.  Pt sipped ginger ale x 2 sips during session, swallowing 1st sip, and dribbling 2nd sip onto shirt without awareness.  SLP informed.   No pain or vertigo reported.  W/c propulsion using bil LEs with min assist x 150'. Bed> w/c to L mod assist; w/c> mat in gym to L with max assist stand pivot.  neuromuscular re-education via forced use, multimodal cues for reaching within BOS to R/L for trunk shortening/lengthening/rotating to facilitate trunk righting reactions. Seated activity using R hand for picking up and placing small figures with min assist.    Seated bil knee long arc ext to hold feet off of floor while PT pushed w/c, with cues for RLE attention.  Sit> stand with min guard assist to RW; gait in room with RW x 16' over level tile with min guard assist. Pt requested getting back to bed due to nausea.  Pt left resting in bed, HOB raised, bed alarm set and all needs within reach.  Therapy Documentation Precautions:  Precautions Precautions: Fall Precaution Comments: right hemiparesis with increased pain in the left wrist and hand secondary to gout Restrictions Weight Bearing Restrictions: No        See Function Navigator for Current Functional Status.  Therapy/Group: Individual Therapy  Naaman Curro 10/28/2015, 12:22 PM

## 2015-10-28 NOTE — Progress Notes (Signed)
Speech Language Pathology Daily Session Note  Patient Details  Name: [name] MRN: [id number] Date of Birth: [birth year]  Today's Date: 10/28/2015 SLP Individual Time: 1502-1530 SLP Individual Time Calculation (min): 28 min   Short Term Goals: Week 1: SLP Short Term Goal 1 (Week 1): Pt will demonstrate efficient mastication and oral clearance of regular textures over 3 observed sessions to demonstrate readiness for diet advancement.  SLP Short Term Goal 2 (Week 1): Pt will demonstrate intellectual awareness within functional tasks with Mod A cues.  SLP Short Term Goal 3 (Week 1): Pt will utilize speech intelligibility strategies at the simple conversation level with Min A verbal cues. to achieve greater than 90% intelligibility.  SLP Short Term Goal 4 (Week 1): Pt will demonstrate functional problem solving for mildly complex tasks with Mod A verbal cues.   Skilled Therapeutic Interventions:  Pt was seen for skilled ST targeting cognitive goals.  Pt awake, laying flat in bed upon arrival with complaints of nausea, fullness, "can't swallow," and constipation.  Per RN pt hasn't eaten today and lunch tray was left untouched on bedside tray.  Pt was encouraged to get out of bed and eat.  He was agreeable to getting out of bed with max assist  but declined all offered POs.  Pt required more than a reasonable amount of time for initiation when transferring from bed to wheelchair, suspect due to perseveration on discomfort.  Pt repeatedly reporting fullness, dry heaving, and nausea with therapist.  RN, PA, and MD made aware.  Pt now awaiting KUB.  Pt attempted to use the bathroom with therapist.  Min verbal cues needed to apply and remove brakes on wheelchair during transfers.  Pt passed gas while seated on commode but no stool. Pt was returned to bed at the end of today's therapy session. Bed alarm set and call bell left within reach.  Continue per current plan of care.   Function:  Eating Eating    Modified Consistency Diet: Yes Eating Assist Level: Set up assist for   Eating Set Up Assist For: Opening containers;Cutting food       Cognition Comprehension Comprehension assist level: Follows basic conversation/direction with extra time/assistive device  Expression   Expression assist level: Expresses basic 90% of the time/requires cueing < 10% of the time.  Social Interaction Social Interaction assist level: Interacts appropriately 50 - 74% of the time - May be physically or verbally inappropriate.  Problem Solving Problem solving assist level: Solves basic 50 - 74% of the time/requires cueing 25 - 49% of the time  Memory Memory assist level: Recognizes or recalls 50 - 74% of the time/requires cueing 25 - 49% of the time    Pain Pain Assessment Pain Assessment: Faces Faces Pain Scale: Hurts whole lot Pain Type: Acute pain Pain Location: Abdomen Pain Descriptors / Indicators: Discomfort Pain Intervention(s): RN made aware;Repositioned;Rest  Therapy/Group: Individual Therapy  [name], Selinda Orion 10/28/2015, 3:49 PM

## 2015-10-28 NOTE — Plan of Care (Signed)
Problem: RH BLADDER ELIMINATION Goal: RH STG MANAGE BLADDER WITH ASSISTANCE STG Manage Bladder With mod Assistance  Outcome: Progressing In and out Cath has been performed by staff,since 8/29.

## 2015-10-28 NOTE — Progress Notes (Signed)
Occupational Therapy Weekly Progress Note  Patient Details  Name: [name] MRN: [id number] Date of Birth: [birth year]  Beginning of progress report period: October 22, 2015 End of progress report period: October 28, 2015  Today's Date: 10/28/2015 OT Individual Time: 1300-1413 OT Individual Time Calculation (min): 73 min     Patient has met 2 of 5 short term goals.  [name] continues to need mod assist for sit to stand and functional transfers as well as min assist for UB selfcare in sitting.  RUE function has improved to an active assist level for bathing with min assist.  [name] continues to demonstrate decreased mood and fluctuating nausea which limit functional participation.  This has affected his motivation to participate actively over the past 3 days.  He continues to perseverate on feeling "bad" and wishing he could throw up.  When not in therapy he tends to stay in the bed secondary to feeling so bad.  Diplopia has also been reported, but this therapist has not noted any during treatment sessions.  Feel he will continue to need comprehensive OT treatment for at least 2 more weeks in order to further progress with independence level.  Still feel he will need 24 hour supervision for home with daughter.        Patient continues to demonstrate the following deficits: decreased balance, decreased endurance, decreased RUE functional use, and therefore will continue to benefit from skilled OT intervention to enhance overall performance with BADL.  Patient progressing toward long term goals..  Continue plan of care.  OT Short Term Goals Week 2:  OT Short Term Goal 1 (Week 2): Pt will transfer to the elevated toilet with min assist using LRAD. OT Short Term Goal 2 (Week 2): Pt will complete all bathing with no more than min assist.   OT Short Term Goal 3 (Week 2): Pt will complete LB dressing with min assist using AE PRN. OT Short Term Goal 4 (Week 2): Pt will perform shower transfers with  min assist to the walk-in shower for 3 consecutive sessions. OT Short Term Goal 5 (Week 2): Pt will use the RUE for all dressing tasks at a diminshed level with supervision.     Skilled Therapeutic Interventions/Progress Updates:    Pt completed shower and dressing during session.  Still with decreased mood and nausea at start of session with pt needing mod coaxing to transfer to the EOB.  Mod assist for hand held assist to walk to the shower bench in the bathroom.  Pt needing mod instructional cueing to sequence through bathing tasks as pt would perseverate on feeling bad and being nauseas stating "If I could throw up I'd feel better.  He completed dressing sit to stand at the sink.  Decreased ability to reach either foot for donning pants, or socks but could complete part of it with 50% help.  Pt brushed teeth with mod instructional cueing for sequencing while sitting in wheelchair at the sink.  Pt transferred back to bed at end of session with call button and phone in reach.   Therapy Documentation Precautions:  Precautions Precautions: Fall Precaution Comments: right hemiparesis with increased pain in the left wrist and hand secondary to gout Restrictions Weight Bearing Restrictions: No  Pain: Pain Assessment Pain Assessment: Faces Faces Pain Scale: Hurts whole lot Pain Type: Acute pain Pain Location: Abdomen Pain Descriptors / Indicators: Discomfort Pain Intervention(s): RN made aware;Repositioned;Rest ADL: See Function Navigator for Current Functional Status.   Therapy/Group: Individual Therapy  Garnet Overfield OTR/L 10/28/2015, 4:18 PM

## 2015-10-28 NOTE — Progress Notes (Signed)
SLP Cancellation Note  Patient Details Name: [name] MRN: [id number] DOB: [birth year]   Cancelled treatment:         Patient missed 30 minutes of skilled SLP therapy due to reports of severe dizziness, nausea, and feeling like he will vomit upon arousal attempts.  RN provided meds for these symptoms that appear to be causing increased fatigue as well.  Will follow up as able.                                                                                               Gunnar Fusi, M.A., CCC-SLP [phone]  [place] 10/28/2015, 9:46 AM

## 2015-10-28 NOTE — Progress Notes (Signed)
Subjective/Complaints: Pt laying in bed this AM.  He appears more fatigued and minimally communicates.  ROS-  +Right hand weakness, heaviness, nausea. Denies CP,SOB, Diarrhea  Objective: Vital Signs: Blood pressure (!) 171/67, pulse 93, temperature 98.4 F (36.9 C), temperature source Oral, resp. rate 17, height 5' 8"  (1.727 m), weight 79.2 kg (174 lb 9.7 oz), SpO2 96 %. No results found. Results for orders placed or performed during the hospital encounter of 10/21/15 (from the past 72 hour(s))  Basic metabolic panel     Status: Abnormal   Collection Time: 10/25/15 10:55 AM  Result Value Ref Range   Sodium 136 135 - 145 mmol/L   Potassium 3.9 3.5 - 5.1 mmol/L   Chloride 104 101 - 111 mmol/L   CO2 21 (L) 22 - 32 mmol/L   Glucose, Bld 168 (H) 65 - 99 mg/dL   BUN 29 (H) 6 - 20 mg/dL   Creatinine, Ser 1.68 (H) 0.61 - 1.24 mg/dL   Calcium 9.4 8.9 - 10.3 mg/dL   GFR calc non Af Amer 35 (L) >60 mL/min   GFR calc Af Amer 41 (L) >60 mL/min    Comment: (NOTE) The eGFR has been calculated using the CKD EPI equation. This calculation has not been validated in all clinical situations. eGFR's persistently <60 mL/min signify possible Chronic Kidney Disease.    Anion gap 11 5 - 15  CBC with Differential/Platelet     Status: Abnormal   Collection Time: 10/25/15 10:55 AM  Result Value Ref Range   WBC 11.7 (H) 4.0 - 10.5 K/uL   RBC 5.01 4.22 - 5.81 MIL/uL   Hemoglobin 15.5 13.0 - 17.0 g/dL   HCT 45.2 39.0 - 52.0 %   MCV 90.2 78.0 - 100.0 fL   MCH 30.9 26.0 - 34.0 pg   MCHC 34.3 30.0 - 36.0 g/dL   RDW 14.0 11.5 - 15.5 %   Platelets 200 150 - 400 K/uL   Neutrophils Relative % 79 %   Neutro Abs 9.2 (H) 1.7 - 7.7 K/uL   Lymphocytes Relative 10 %   Lymphs Abs 1.2 0.7 - 4.0 K/uL   Monocytes Relative 10 %   Monocytes Absolute 1.2 (H) 0.1 - 1.0 K/uL   Eosinophils Relative 1 %   Eosinophils Absolute 0.1 0.0 - 0.7 K/uL   Basophils Relative 0 %   Basophils Absolute 0.0 0.0 - 0.1 K/uL   Glucose, capillary     Status: Abnormal   Collection Time: 10/25/15 11:40 AM  Result Value Ref Range   Glucose-Capillary 189 (H) 65 - 99 mg/dL   Comment 1 Notify RN   Glucose, capillary     Status: Abnormal   Collection Time: 10/25/15  4:31 PM  Result Value Ref Range   Glucose-Capillary 333 (H) 65 - 99 mg/dL   Comment 1 Notify RN   Urine culture     Status: Abnormal   Collection Time: 10/25/15  5:12 PM  Result Value Ref Range   Specimen Description URINE, RANDOM    Special Requests NONE    Culture MULTIPLE SPECIES PRESENT, SUGGEST RECOLLECTION (A)    Report Status 10/26/2015 FINAL   Urinalysis, Routine w reflex microscopic (not at [facility])     Status: Abnormal   Collection Time: 10/25/15  5:12 PM  Result Value Ref Range   Color, Urine YELLOW YELLOW   APPearance CLEAR CLEAR   Specific Gravity, Urine 1.023 1.005 - 1.030   pH 5.5 5.0 - 8.0   Glucose, UA 250 (  A) NEGATIVE mg/dL   Hgb urine dipstick NEGATIVE NEGATIVE   Bilirubin Urine NEGATIVE NEGATIVE   Ketones, ur NEGATIVE NEGATIVE mg/dL   Protein, ur NEGATIVE NEGATIVE mg/dL   Nitrite NEGATIVE NEGATIVE   Leukocytes, UA NEGATIVE NEGATIVE    Comment: MICROSCOPIC NOT DONE ON URINES WITH NEGATIVE PROTEIN, BLOOD, LEUKOCYTES, NITRITE, OR GLUCOSE <1000 mg/dL.  Glucose, capillary     Status: Abnormal   Collection Time: 10/25/15  9:10 PM  Result Value Ref Range   Glucose-Capillary 259 (H) 65 - 99 mg/dL  CBC with Differential/Platelet     Status: Abnormal   Collection Time: 10/26/15  5:28 AM  Result Value Ref Range   WBC 12.0 (H) 4.0 - 10.5 K/uL   RBC 5.05 4.22 - 5.81 MIL/uL   Hemoglobin 15.1 13.0 - 17.0 g/dL   HCT 45.6 39.0 - 52.0 %   MCV 90.3 78.0 - 100.0 fL   MCH 29.9 26.0 - 34.0 pg   MCHC 33.1 30.0 - 36.0 g/dL   RDW 14.1 11.5 - 15.5 %   Platelets 215 150 - 400 K/uL   Neutrophils Relative % 65 %   Neutro Abs 7.8 (H) 1.7 - 7.7 K/uL   Lymphocytes Relative 24 %   Lymphs Abs 2.9 0.7 - 4.0 K/uL   Monocytes Relative 10 %    Monocytes Absolute 1.2 (H) 0.1 - 1.0 K/uL   Eosinophils Relative 1 %   Eosinophils Absolute 0.1 0.0 - 0.7 K/uL   Basophils Relative 0 %   Basophils Absolute 0.0 0.0 - 0.1 K/uL  Comprehensive metabolic panel     Status: Abnormal   Collection Time: 10/26/15  5:28 AM  Result Value Ref Range   Sodium 135 135 - 145 mmol/L   Potassium 3.9 3.5 - 5.1 mmol/L   Chloride 103 101 - 111 mmol/L   CO2 23 22 - 32 mmol/L   Glucose, Bld 134 (H) 65 - 99 mg/dL   BUN 30 (H) 6 - 20 mg/dL   Creatinine, Ser 1.57 (H) 0.61 - 1.24 mg/dL   Calcium 9.4 8.9 - 10.3 mg/dL   Total Protein 6.6 6.5 - 8.1 g/dL   Albumin 3.2 (L) 3.5 - 5.0 g/dL   AST 35 15 - 41 U/L   ALT 62 17 - 63 U/L   Alkaline Phosphatase 44 38 - 126 U/L   Total Bilirubin 0.6 0.3 - 1.2 mg/dL   GFR calc non Af Amer 38 (L) >60 mL/min   GFR calc Af Amer 44 (L) >60 mL/min    Comment: (NOTE) The eGFR has been calculated using the CKD EPI equation. This calculation has not been validated in all clinical situations. eGFR's persistently <60 mL/min signify possible Chronic Kidney Disease.    Anion gap 9 5 - 15  Glucose, capillary     Status: Abnormal   Collection Time: 10/26/15  6:43 AM  Result Value Ref Range   Glucose-Capillary 150 (H) 65 - 99 mg/dL  Occult blood card to lab, stool RN will collect     Status: None   Collection Time: 10/26/15  8:53 AM  Result Value Ref Range   Fecal Occult Bld NEGATIVE NEGATIVE  Glucose, capillary     Status: Abnormal   Collection Time: 10/26/15 11:35 AM  Result Value Ref Range   Glucose-Capillary 251 (H) 65 - 99 mg/dL  Glucose, capillary     Status: Abnormal   Collection Time: 10/26/15  4:56 PM  Result Value Ref Range   Glucose-Capillary 250 (H) 65 -  99 mg/dL  Glucose, capillary     Status: Abnormal   Collection Time: 10/26/15  8:37 PM  Result Value Ref Range   Glucose-Capillary 250 (H) 65 - 99 mg/dL  Occult blood card to lab, stool RN will collect     Status: None   Collection Time: 10/27/15 12:14 AM   Result Value Ref Range   Fecal Occult Bld NEGATIVE NEGATIVE  Glucose, capillary     Status: Abnormal   Collection Time: 10/27/15  6:30 AM  Result Value Ref Range   Glucose-Capillary 178 (H) 65 - 99 mg/dL  Urinalysis, Routine w reflex microscopic (not at [facility])     Status: Abnormal   Collection Time: 10/27/15  9:22 AM  Result Value Ref Range   Color, Urine YELLOW YELLOW   APPearance CLEAR CLEAR   Specific Gravity, Urine 1.021 1.005 - 1.030   pH 6.0 5.0 - 8.0   Glucose, UA 100 (A) NEGATIVE mg/dL   Hgb urine dipstick NEGATIVE NEGATIVE   Bilirubin Urine NEGATIVE NEGATIVE   Ketones, ur NEGATIVE NEGATIVE mg/dL   Protein, ur NEGATIVE NEGATIVE mg/dL   Nitrite NEGATIVE NEGATIVE   Leukocytes, UA NEGATIVE NEGATIVE    Comment: MICROSCOPIC NOT DONE ON URINES WITH NEGATIVE PROTEIN, BLOOD, LEUKOCYTES, NITRITE, OR GLUCOSE <1000 mg/dL.  Glucose, capillary     Status: Abnormal   Collection Time: 10/27/15 12:04 PM  Result Value Ref Range   Glucose-Capillary 218 (H) 65 - 99 mg/dL  Glucose, capillary     Status: Abnormal   Collection Time: 10/27/15  4:44 PM  Result Value Ref Range   Glucose-Capillary 325 (H) 65 - 99 mg/dL  Glucose, capillary     Status: Abnormal   Collection Time: 10/27/15  8:36 PM  Result Value Ref Range   Glucose-Capillary 200 (H) 65 - 99 mg/dL  Glucose, capillary     Status: Abnormal   Collection Time: 10/28/15  6:15 AM  Result Value Ref Range   Glucose-Capillary 168 (H) 65 - 99 mg/dL     HEENT: normocephalic, atraumatic.  Cardio: RRR and no murmur Resp: CTA B/L and unlabored GI: BS positive and NT, ND Skin:   Intact. Warm and dry. Neuro: Alert/Oriented Motor 4-/5 R delt, bi, tri, grip. Apraxia. Decreased [place]. (?effort) 4/5 R HF, KE ,4+/5 ADF/PF (?effort) LUE/LLE: 4+/5 proximal to distal Dysarthria Musc/Skel:  No edema. No erythema. Gen: NAD. Vitals signs reviewed.    Assessment/Plan: 1. Functional deficits secondary to Left pontine infarct and new left posterior  lateral temporal infarct which require 3+ hours per day of interdisciplinary therapy in a comprehensive inpatient rehab setting. Physiatrist is providing close team supervision and 24 hour management of active medical problems listed below. Physiatrist and rehab team continue to assess barriers to discharge/monitor patient progress toward functional and medical goals. FIM: Function - Bathing Position: Shower Body parts bathed by patient: Right arm, Left arm, Chest, Abdomen, Front perineal area, Buttocks, Right upper leg, Left upper leg Body parts bathed by helper: Right lower leg, Left lower leg, Back Assist Level: Touching or steadying assistance(Pt > 75%)  Function- Upper Body Dressing/Undressing What is the patient wearing?: Pull over shirt/dress Pull over shirt/dress - Perfomed by patient: Thread/unthread right sleeve, Thread/unthread left sleeve, Put head through opening, Pull shirt over trunk Pull over shirt/dress - Perfomed by helper: Thread/unthread right sleeve, Thread/unthread left sleeve, Put head through opening, Pull shirt over trunk Assist Level: Supervision or verbal cues Function - Lower Body Dressing/Undressing What is the patient wearing?: Pants Position: Sitting  EOB Underwear - Performed by patient: Thread/unthread left underwear leg, Pull underwear up/down Underwear - Performed by helper: Pull underwear up/down, Thread/unthread left underwear leg, Thread/unthread right underwear leg Pants- Performed by patient: Thread/unthread right pants leg, Thread/unthread left pants leg, Pull pants up/down Pants- Performed by helper: Thread/unthread right pants leg, Thread/unthread left pants leg, Pull pants up/down Non-skid slipper socks- Performed by helper: Don/doff right sock, Don/doff left sock TED Hose - Performed by helper: Don/doff right TED hose, Don/doff left TED hose Assist for footwear: Dependant Assist for lower body dressing: Touching or steadying assistance (Pt >  75%)  Function - Toileting Toileting activity did not occur: Refused Toileting steps completed by patient: Adjust clothing prior to toileting, Performs perineal hygiene Toileting steps completed by helper: Adjust clothing after toileting Toileting Assistive Devices: Grab bar or rail Assist level: Supervision or verbal cues  Function Midwife transfer assistive device: Grab bar Assist level to toilet: Touching or steadying assistance (Pt > 75%) Assist level from toilet: Touching or steadying assistance (Pt > 75%)  Function - Chair/bed transfer Chair/bed transfer method: Stand pivot Chair/bed transfer assist level: Moderate assist (Pt 50 - 74%/lift or lower) Chair/bed transfer assistive device: Armrests Chair/bed transfer details: Manual facilitation for weight shifting, Verbal cues for precautions/safety, Verbal cues for technique, Verbal cues for sequencing  Function - Locomotion: Wheelchair Will patient use wheelchair at discharge?: Yes Type: Manual Max wheelchair distance: 200 Assist Level: Supervision or verbal cues Assist Level: Supervision or verbal cues Assist Level: Supervision or verbal cues Turns around,maneuvers to table,bed, and toilet,negotiates 3% grade,maneuvers on rugs and over doorsills: No Function - Locomotion: Ambulation Assistive device: No device Max distance: 175 Assist level: Moderate assist (Pt 50 - 74%) Assist level: Moderate assist (Pt 50 - 74%) Walk 50 feet with 2 turns activity did not occur: Safety/medical concerns Assist level: Moderate assist (Pt 50 - 74%) Walk 150 feet activity did not occur: Safety/medical concerns Assist level: Moderate assist (Pt 50 - 74%) Walk 10 feet on uneven surfaces activity did not occur: Safety/medical concerns Assist level: Maximal assist (Pt 25 - 49%)  Function - Comprehension Comprehension: Auditory Comprehension assist level: Understands basic 90% of the time/cues < 10% of the time  Function -  Expression Expression: Verbal Expression assist level: Expresses basic 75 - 89% of the time/requires cueing 10 - 24% of the time. Needs helper to occlude trach/needs to repeat words.  Function - Social Interaction Social Interaction assist level: Interacts appropriately 75 - 89% of the time - Needs redirection for appropriate language or to initiate interaction.  Function - Problem Solving Problem solving assist level: Solves basic 50 - 74% of the time/requires cueing 25 - 49% of the time  Function - Memory Memory assist level: Recognizes or recalls 50 - 74% of the time/requires cueing 25 - 49% of the time Patient normally able to recall (first 3 days only): Current season, That he or she is in a hospital  Medical Problem List and Plan:   1.  Right hemiparesis dysarthria/dysphagia secondary to left pontine infarct on 10/14/15 and left posterior lateral temporal infarct 8/27  Cont CIR  New stroke on 8/27, MRI reviewed with new stroke, spoke to Neurology, no changes in medication, possible Loop.  2.  DVT Prophylaxis/Anticoagulation: Subcutaneous heparin changed to lovenox. Monitor platelet count and any signs of bleeding 3. Pain Management: Tylenol as needed 4. Hypertension.   Lopressor discontinued due to bradycardia.  Increased hydralazine 50 mg every 6 hours on 8/26, increased  to 100 on 8/27  Norvasc 5 started 8/26  Will need to monitor closely, once off steroids  Will not make further changes at present and allow for permissive HTN, improving, goal SBP 130-150 5. Sinus arrest due to vagal spell. Beta blocker has been discontinued for now. No additional episodes. Follow-up per cardiology services. No plan at this time for pacemaker. 6. Neuropsych: This patient is not fully capable of making decisions on his own behalf./  Started on Zoloft 8/27, changed to Prozaac on 8/28, d/ced 8/31 due to ?nausea 7. Skin/Wound Care: Routine skin check 8. Fluids/Electrolytes/Nutrition: Routine I&O's   9. Chronic renal insufficiency. Baseline creatinine 1.5.   Cr. 1.57 on 8/28  Will cont to monitor  Labs ordered for tomorrow 10. Diet-controlled diabetes mellitus. Hemoglobin A1c 7.3. Check blood sugars before meals and at bedtime. Diabetic teaching.  Monitor as activity increased  Will consider scheduling medications if needed after steroids complete CBG (last 3)   Recent Labs  10/27/15 1644 10/27/15 2036 10/28/15 0615  GLUCAP 325* 200* 168*  11. CAD/SVT. No chest pain or shortness of breath.  12. Gout. Placed on colchicine 1 dose as well as prednisone 40 mg daily improving  (started 8/24), decreased to 9m on 8/29 x2 days, 176m8/31, will cont to wean 13. Hyperlipidemia. Pravachol 14. ?Hematuria: Workup unremarkable  Per pt.  Per nursing, no blood, however, pt states it was when he began urinating  UA relatively unremarkable  Hb stable 15. Subjective bleeding: No objective findings  Pt noted to have increased bleeding after heparin injection with self-reported hematuria   UA negative  Hemoccult negative 16. Urinary retention  Confirmed with PVRs  Will start bethanechol 10 TID 17. Nausea  SSRI d/ced  Bethanechol started  Ucx pending   LOS (Days) 7 A FACE TO FACE EVALUATION WAS PERFORMED  Ankit AnLorie Phenix/30/2017, 9:25 AM

## 2015-10-28 NOTE — Progress Notes (Signed)
EP service called to evaluate for loop implant secondary to stroke.  Discussed with CIR PA, and neuro team, given logistics of needing to be discharged from CIR and then readmitted after the implant, both rehab PA and neuro team felt implant  was best to wait until patient was ready for discharge to be done as an out patient.    Please recall EP service when ready.  Thanks Tommye Standard, PA-C  EP Attending  Agree with above.   Mikle Bosworth.D.

## 2015-10-28 NOTE — Patient Care Conference (Signed)
Inpatient RehabilitationTeam Conference and Plan of Care Update Date: 10/28/2015   Time: 10:30 AM    Patient Name: [name]      Medical Record Number: [id number]  Date of Birth: 04-08-1927 Sex: Male         Room/Bed: 4W23C/4W23C-01 Payor Info: Payor: [place] / Plan: [employer] MEDICARE / Product Type: *No Product type* /    Admitting Diagnosis: CVA  Admit Date/Time:  10/21/2015  5:15 PM Admission Comments: No comment available   Primary Diagnosis:  <principal problem not specified> Principal Problem: <principal problem not specified>  Patient Active Problem List   Diagnosis Date Noted  . Nausea without vomiting   . Urinary retention   . 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 Urinary hesitancy   . Right sided weakness   . Acute ischemic left MCA stroke ([place])   . Hematuria   . Bleeding   . Leukocytosis   . Depression   . Steroid-induced hyperglycemia   . Left pontine CVA ([place]) 10/21/2015  . Dysarthria, post-stroke   . Right hemiparesis ([place])   . Sinus arrest   . CKD (chronic kidney disease)   . DM type 2 without retinopathy ([place])   . Coronary artery disease involving native coronary artery of native heart without angina pectoris   . Acute idiopathic gout of right hand   . Aphasia   . Dysphagia, post-stroke   . HLD (hyperlipidemia)   . Benign essential HTN   . Orthostatic hypotension   . 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[place]) 10/14/2015  . CVA (cerebral infarction) 10/14/2015  . Accelerated hypertension 10/14/2015  . Type 2 diabetes mellitus with circulatory disorder ([place]) 10/14/2015  . Peripheral vascular disease, unspecified ([place]) 06/04/2013    Expected Discharge Date: Expected Discharge Date: 11/11/15  Team Members Present: Physician leading conference: Dr. Delice Lesch Social Worker Present: Ovidio Kin, LCSW Nurse Present: Heather Roberts, RN PT Present: Georjean Mode, PT OT  Present: Clyda Greener, OT SLP Present: Windell Moulding, SLP PPS Coordinator present : Daiva Nakayama, RN, CRRN     Current Status/Progress Goal Weekly Team Focus  Medical   Right hemiparesis dysarthria/dysphagia secondary to left pontine infarct on 10/14/15 and left posterior lateral temporal infarct 8/27  Improve safety, perseverative behavior, HTN, Dperession, CKD, Hyperglycemia, Gout, Urinary retention, Nausea  See above   Bowel/Bladder   I/O cath every 6-8hrs, LBM 8/28  To be able to void  Monitor Bladder and bowel function   Swallow/Nutrition/ Hydration   Dys 3, thin liquids; intermittent supervision   mod I  suspect pt is near his baseline for dysphagia due to history of esophageal tear   ADL's   supervision to min assist UB selfcare, min to mod assist LB selfcare and for functional transfers.  RUE decreased coordination, needs min to mod assist for integration into dressing tasks.  Limited by nausea, dizziness,   supervision overall  selfcare re-training, transfer training, balance re-training, therapeutic exercise, neuromuscular re-education, pt/family education   Mobility   mod assist bed mobility, mod/max assist transfers, gait x 16' min assist with RW  supervision overall including gait x 150' and up/down 12 steps  activity tolerance, pt and family ed, transfers, gait   Communication   mild dysarthria, supervision   mod I   education and carryover of intelligibility strategies.    Safety/Cognition/ Behavioral Observations  limited by medical issues; reports of pain, nausea, dizziness, light headedness; mod assist  min assist  basic problem solving for basic, familiar tasks; awareness of deficits    Pain   C/O pain  <3  Assess and treat pain during qshift    Skin                *See Care Plan and progress notes for long and short-term goals.  Barriers to Discharge: Safety, perseverative behavior, HTN, Dperession, CKD, Hyperglycemia, Gout, Urinary retention, Nausea    Possible  Resolutions to Barriers:  Workup for nausea, follow labs, therapies, optimize meds, meds for bladder    Discharge Planning/Teaching Needs:  Home with family-daughter and son in-law to make sure he has 24 hr care. Daughter has been here and observed in therapies. Seen by neuro-psych for coping      Team Discussion:  Pt having nausea and dizziness today-MD addressing-stopped SRI and BP improving. Pt is distracted by everything that is going on-feels medically something about to go wrong. I & O cath now not voiding. Goals set for supervision level, was doing well before second CVA.   Revisions to Treatment Plan:  Small pontine CVA over the weekend   Continued Need for Acute Rehabilitation Level of Care: The patient requires daily medical management by a physician with specialized training in physical medicine and rehabilitation for the following conditions: Daily direction of a multidisciplinary physical rehabilitation program to ensure safe treatment while eliciting the highest outcome that is of practical value to the patient.: Yes Daily medical management of patient stability for increased activity during participation in an intensive rehabilitation regime.: Yes Daily analysis of laboratory values and/or radiology reports with any subsequent need for medication adjustment of medical intervention for : Neurological problems;Diabetes problems;Blood pressure problems;Renal problems;Mood/behavior problems;Urological problems  Elease Hashimoto 10/29/2015, 9:59 AM

## 2015-10-28 NOTE — Progress Notes (Signed)
Social Work Patient ID: [name], male   DOB: 09-14-1927, 81 y.o.   MRN: [id number]   Spoke with daughter-[name] via telephone to inform of team conference goals supervision level and target discharge date 9/13. She is glad the prozac was discharged she has heard bad things about this. She is glad about this. She has been here and seen him therapies. Pt wants to feel better or go on his way, according to him. He feels something medically is going to happen to him here. He is comforted to know the MD's have checked him out and neurologist has seen him also. Will continue to work on discharge needs.

## 2015-10-29 ENCOUNTER — Inpatient Hospital Stay (HOSPITAL_COMMUNITY): Payer: Medicare Other | Admitting: Physical Therapy

## 2015-10-29 ENCOUNTER — Inpatient Hospital Stay (HOSPITAL_COMMUNITY): Payer: Medicare Other | Admitting: Speech Pathology

## 2015-10-29 ENCOUNTER — Inpatient Hospital Stay (HOSPITAL_COMMUNITY): Payer: Medicare Other | Admitting: Occupational Therapy

## 2015-10-29 LAB — BASIC METABOLIC PANEL
Anion gap: 8 (ref 5–15)
BUN: 25 mg/dL — AB (ref 6–20)
CALCIUM: 9.1 mg/dL (ref 8.9–10.3)
CHLORIDE: 102 mmol/L (ref 101–111)
CO2: 25 mmol/L (ref 22–32)
CREATININE: 1.58 mg/dL — AB (ref 0.61–1.24)
GFR calc Af Amer: 44 mL/min — ABNORMAL LOW (ref >60)
GFR calc non Af Amer: 38 mL/min — ABNORMAL LOW (ref >60)
Glucose, Bld: 149 mg/dL — ABNORMAL HIGH (ref 65–99)
Potassium: 4 mmol/L (ref 3.5–5.1)
Sodium: 135 mmol/L (ref 135–145)

## 2015-10-29 LAB — GLUCOSE, CAPILLARY
GLUCOSE-CAPILLARY: 169 mg/dL — AB (ref 65–99)
GLUCOSE-CAPILLARY: 167 mg/dL — AB (ref 65–99)
Glucose-Capillary: 209 mg/dL — ABNORMAL HIGH (ref 65–99)
Glucose-Capillary: 233 mg/dL — ABNORMAL HIGH (ref 65–99)

## 2015-10-29 LAB — CBC WITH DIFFERENTIAL/PLATELET
Basophils Absolute: 0 10*3/uL (ref 0.0–0.1)
Basophils Relative: 0 %
EOS ABS: 0.3 10*3/uL (ref 0.0–0.7)
EOS PCT: 2 %
HCT: 46.7 % (ref 39.0–52.0)
Hemoglobin: 15.2 g/dL (ref 13.0–17.0)
LYMPHS ABS: 2 10*3/uL (ref 0.7–4.0)
Lymphocytes Relative: 18 %
MCH: 29.8 pg (ref 26.0–34.0)
MCHC: 32.5 g/dL (ref 30.0–36.0)
MCV: 91.6 fL (ref 78.0–100.0)
MONO ABS: 1.2 10*3/uL — AB (ref 0.1–1.0)
MONOS PCT: 11 %
Neutro Abs: 7.8 10*3/uL — ABNORMAL HIGH (ref 1.7–7.7)
Neutrophils Relative %: 69 %
PLATELETS: 216 10*3/uL (ref 150–400)
RBC: 5.1 MIL/uL (ref 4.22–5.81)
RDW: 14.4 % (ref 11.5–15.5)
WBC: 11.3 10*3/uL — ABNORMAL HIGH (ref 4.0–10.5)

## 2015-10-29 MED ORDER — TAMSULOSIN HCL 0.4 MG PO CAPS
0.4000 mg | ORAL_CAPSULE | Freq: Every day | ORAL | Status: DC
  Administered 2015-10-29 – 2015-10-30 (×2): 0.4 mg via ORAL
  Filled 2015-10-29 (×2): qty 1

## 2015-10-29 NOTE — Progress Notes (Signed)
Physical Therapy Note  Patient Details  Name: [name] MRN: [id number] Date of Birth: [birth year] Today's Date: 10/29/2015    Time 1: 830-915 45 minutes  1:1 Pt with no c/o pain, c/o nausea throughout treatment. RN aware and meds given during session.  Pt performed bed mobility rolling with min A, use of bed rails. Supine to sit with close supervision, increased time and cuing.  Pt performed sit to stand multiple times for strengthening with min A, cues for UE placement each attempt.  Gait with RW 35' x 2, then 200' with min guard. Pt motivated to return to room at end of session and able to walk the entire way when distracted by conversation.  Seated soft tissue work and neck stretches due to c/o neck feeling "stiff" in the bed.  Pt missed 15 minutes of session due to nursing care.  Time 2: 1330-1412 42 minutes  1:1 pt c/o continued neck pain, RN made aware and to order hot pack for use in room.  Session focused on increasing stamina and mm endurance. Nustep level 3 x 5 minutes with 2 1 minute rest breaks due to fatigue.  Standing tolerance with Rt UE grasp and release tasks and horseshoe toss. Pt able to stand 4 minutes before needing to sit due to fatigue. Pt continues to c/o nausea throughout session but able to participate if distracted by activity.   DONAWERTH,KAREN 10/29/2015, 9:21 AM

## 2015-10-29 NOTE — Progress Notes (Signed)
Subjective/Complaints: Feels like he needs to move bowels but cannot push it out  ROS-  +Right hand weakness, heaviness, nausea. Denies CP,SOB, Diarrhea  Objective: Vital Signs: Blood pressure (!) 164/57, pulse 84, temperature 98 F (36.7 C), temperature source Oral, resp. rate 17, height 5' 8"  (1.727 m), weight 79.2 kg (174 lb 9.7 oz), SpO2 96 %. Dg Abd 1 View  Result Date: 10/28/2015 CLINICAL DATA:  Two weeks of nausea without relief. No other complaints. EXAM: ABDOMEN - 1 VIEW COMPARISON:  Barium esophagram of October 21, 2015 FINDINGS: The colonic stool burden is moderate. There is contrast in the transverse, descending, and rectosigmoid portions of the colon from the previous barium esophagram. No small or large bowel obstructive pattern is observed. There are faint calcifications which project over the right renal fossa and to a lesser extent over the left renal fossa. There is calcification within the iliac arteries. There are pelvic phleboliths. IMPRESSION: No acute bowel abnormality is observed. Faint calcifications project over the renal fossae bilaterally which could reflect stones in the appropriate clinical setting. Given the patient's persistent symptoms, abdominal ultrasound or abdominal and pelvic CT scanning would be a useful next imaging step. Electronically Signed   By: David  Martinique M.D.   On: 10/28/2015 16:41   Results for orders placed or performed during the hospital encounter of 10/21/15 (from the past 72 hour(s))  Occult blood card to lab, stool RN will collect     Status: None   Collection Time: 10/26/15  8:53 AM  Result Value Ref Range   Fecal Occult Bld NEGATIVE NEGATIVE  Glucose, capillary     Status: Abnormal   Collection Time: 10/26/15 11:35 AM  Result Value Ref Range   Glucose-Capillary 251 (H) 65 - 99 mg/dL  Glucose, capillary     Status: Abnormal   Collection Time: 10/26/15  4:56 PM  Result Value Ref Range   Glucose-Capillary 250 (H) 65 - 99 mg/dL   Glucose, capillary     Status: Abnormal   Collection Time: 10/26/15  8:37 PM  Result Value Ref Range   Glucose-Capillary 250 (H) 65 - 99 mg/dL  Occult blood card to lab, stool RN will collect     Status: None   Collection Time: 10/27/15 12:14 AM  Result Value Ref Range   Fecal Occult Bld NEGATIVE NEGATIVE  Glucose, capillary     Status: Abnormal   Collection Time: 10/27/15  6:30 AM  Result Value Ref Range   Glucose-Capillary 178 (H) 65 - 99 mg/dL  Culture, Urine     Status: None   Collection Time: 10/27/15  9:22 AM  Result Value Ref Range   Specimen Description URINE, CATHETERIZED    Special Requests NONE    Culture NO GROWTH    Report Status 10/28/2015 FINAL   Urinalysis, Routine w reflex microscopic (not at [facility])     Status: Abnormal   Collection Time: 10/27/15  9:22 AM  Result Value Ref Range   Color, Urine YELLOW YELLOW   APPearance CLEAR CLEAR   Specific Gravity, Urine 1.021 1.005 - 1.030   pH 6.0 5.0 - 8.0   Glucose, UA 100 (A) NEGATIVE mg/dL   Hgb urine dipstick NEGATIVE NEGATIVE   Bilirubin Urine NEGATIVE NEGATIVE   Ketones, ur NEGATIVE NEGATIVE mg/dL   Protein, ur NEGATIVE NEGATIVE mg/dL   Nitrite NEGATIVE NEGATIVE   Leukocytes, UA NEGATIVE NEGATIVE    Comment: MICROSCOPIC NOT DONE ON URINES WITH NEGATIVE PROTEIN, BLOOD, LEUKOCYTES, NITRITE, OR GLUCOSE <1000 mg/dL.  Glucose, capillary     Status: Abnormal   Collection Time: 10/27/15 12:04 PM  Result Value Ref Range   Glucose-Capillary 218 (H) 65 - 99 mg/dL  Glucose, capillary     Status: Abnormal   Collection Time: 10/27/15  4:44 PM  Result Value Ref Range   Glucose-Capillary 325 (H) 65 - 99 mg/dL  Glucose, capillary     Status: Abnormal   Collection Time: 10/27/15  8:36 PM  Result Value Ref Range   Glucose-Capillary 200 (H) 65 - 99 mg/dL  Glucose, capillary     Status: Abnormal   Collection Time: 10/28/15  6:15 AM  Result Value Ref Range   Glucose-Capillary 168 (H) 65 - 99 mg/dL  Glucose, capillary      Status: Abnormal   Collection Time: 10/28/15 12:02 PM  Result Value Ref Range   Glucose-Capillary 171 (H) 65 - 99 mg/dL  Glucose, capillary     Status: Abnormal   Collection Time: 10/28/15  4:56 PM  Result Value Ref Range   Glucose-Capillary 195 (H) 65 - 99 mg/dL  Glucose, capillary     Status: Abnormal   Collection Time: 10/28/15 10:00 PM  Result Value Ref Range   Glucose-Capillary 168 (H) 65 - 99 mg/dL   Comment 1 Notify RN   Basic metabolic panel     Status: Abnormal   Collection Time: 10/29/15  4:33 AM  Result Value Ref Range   Sodium 135 135 - 145 mmol/L   Potassium 4.0 3.5 - 5.1 mmol/L   Chloride 102 101 - 111 mmol/L   CO2 25 22 - 32 mmol/L   Glucose, Bld 149 (H) 65 - 99 mg/dL   BUN 25 (H) 6 - 20 mg/dL   Creatinine, Ser 1.58 (H) 0.61 - 1.24 mg/dL   Calcium 9.1 8.9 - 10.3 mg/dL   GFR calc non Af Amer 38 (L) >60 mL/min   GFR calc Af Amer 44 (L) >60 mL/min    Comment: (NOTE) The eGFR has been calculated using the CKD EPI equation. This calculation has not been validated in all clinical situations. eGFR's persistently <60 mL/min signify possible Chronic Kidney Disease.    Anion gap 8 5 - 15  CBC with Differential/Platelet     Status: Abnormal   Collection Time: 10/29/15  4:33 AM  Result Value Ref Range   WBC 11.3 (H) 4.0 - 10.5 K/uL   RBC 5.10 4.22 - 5.81 MIL/uL   Hemoglobin 15.2 13.0 - 17.0 g/dL   HCT 46.7 39.0 - 52.0 %   MCV 91.6 78.0 - 100.0 fL   MCH 29.8 26.0 - 34.0 pg   MCHC 32.5 30.0 - 36.0 g/dL   RDW 14.4 11.5 - 15.5 %   Platelets 216 150 - 400 K/uL   Neutrophils Relative % 69 %   Neutro Abs 7.8 (H) 1.7 - 7.7 K/uL   Lymphocytes Relative 18 %   Lymphs Abs 2.0 0.7 - 4.0 K/uL   Monocytes Relative 11 %   Monocytes Absolute 1.2 (H) 0.1 - 1.0 K/uL   Eosinophils Relative 2 %   Eosinophils Absolute 0.3 0.0 - 0.7 K/uL   Basophils Relative 0 %   Basophils Absolute 0.0 0.0 - 0.1 K/uL  Glucose, capillary     Status: Abnormal   Collection Time: 10/29/15  6:40 AM   Result Value Ref Range   Glucose-Capillary 169 (H) 65 - 99 mg/dL   Comment 1 Notify RN      HEENT: normocephalic, atraumatic.  Cardio: RRR  and no murmur Resp: CTA B/L and unlabored GI: BS positive and NT, ND Skin:   Intact. Warm and dry. Neuro: Alert/Oriented Motor 4-/5 R delt, bi, tri, grip. Apraxia. Decreased [place]. (?effort) 4/5 R HF, KE ,4+/5 ADF/PF (?effort) LUE/LLE: 4+/5 proximal to distal Dysarthria Musc/Skel:  No edema. No erythema. Gen: NAD. Vitals signs reviewed.    Assessment/Plan: 1. Functional deficits secondary to Left pontine infarct and new left posterior lateral temporal infarct which require 3+ hours per day of interdisciplinary therapy in a comprehensive inpatient rehab setting. Physiatrist is providing close team supervision and 24 hour management of active medical problems listed below. Physiatrist and rehab team continue to assess barriers to discharge/monitor patient progress toward functional and medical goals. FIM: Function - Bathing Position: Shower Body parts bathed by patient: Right arm, Left arm, Chest, Abdomen, Front perineal area, Right upper leg, Left upper leg Body parts bathed by helper: Left lower leg, Right lower leg, Buttocks Assist Level: Touching or steadying assistance(Pt > 75%)  Function- Upper Body Dressing/Undressing What is the patient wearing?: Pull over shirt/dress Pull over shirt/dress - Perfomed by patient: Thread/unthread right sleeve, Thread/unthread left sleeve, Put head through opening, Pull shirt over trunk Pull over shirt/dress - Perfomed by helper: Thread/unthread right sleeve, Thread/unthread left sleeve, Put head through opening, Pull shirt over trunk Assist Level: Supervision or verbal cues Function - Lower Body Dressing/Undressing What is the patient wearing?: Pants, Non-skid slipper socks Position: Wheelchair/chair at sink Underwear - Performed by patient: Thread/unthread left underwear leg, Pull underwear  up/down Underwear - Performed by helper: Pull underwear up/down, Thread/unthread left underwear leg, Thread/unthread right underwear leg Pants- Performed by patient: Thread/unthread right pants leg, Thread/unthread left pants leg, Pull pants up/down Pants- Performed by helper: Thread/unthread right pants leg, Thread/unthread left pants leg, Pull pants up/down Non-skid slipper socks- Performed by helper: Don/doff right sock, Don/doff left sock TED Hose - Performed by helper: Don/doff right TED hose, Don/doff left TED hose Assist for footwear: Dependant Assist for lower body dressing: Touching or steadying assistance (Pt > 75%)  Function - Toileting Toileting activity did not occur: Refused Toileting steps completed by patient: Adjust clothing prior to toileting, Performs perineal hygiene Toileting steps completed by helper: Adjust clothing after toileting Toileting Assistive Devices: Grab bar or rail Assist level: Supervision or verbal cues  Function Midwife transfer assistive device: Grab bar Assist level to toilet: Touching or steadying assistance (Pt > 75%) Assist level from toilet: Touching or steadying assistance (Pt > 75%)  Function - Chair/bed transfer Chair/bed transfer method: Stand pivot Chair/bed transfer assist level: Maximal assist (Pt 25 - 49%/lift and lower) Chair/bed transfer assistive device: Armrests Chair/bed transfer details: Manual facilitation for weight shifting, Verbal cues for sequencing, Verbal cues for technique  Function - Locomotion: Wheelchair Will patient use wheelchair at discharge?: Yes Type: Manual Max wheelchair distance: 150 Assist Level: Touching or steadying assistance (Pt > 75%) Assist Level: Touching or steadying assistance (Pt > 75%) Assist Level: Touching or steadying assistance (Pt > 75%) Turns around,maneuvers to table,bed, and toilet,negotiates 3% grade,maneuvers on rugs and over doorsills: No Function - Locomotion:  Ambulation Assistive device: Walker-rolling Max distance: 16 Assist level: Touching or steadying assistance (Pt > 75%) Assist level: Touching or steadying assistance (Pt > 75%) Walk 50 feet with 2 turns activity did not occur: Safety/medical concerns Assist level: Moderate assist (Pt 50 - 74%) Walk 150 feet activity did not occur: Safety/medical concerns Assist level: Moderate assist (Pt 50 - 74%) Walk 10 feet  on uneven surfaces activity did not occur: Safety/medical concerns Assist level: Maximal assist (Pt 25 - 49%)  Function - Comprehension Comprehension: Auditory Comprehension assist level: Follows basic conversation/direction with extra time/assistive device  Function - Expression Expression: Verbal Expression assist level: Expresses basic 75 - 89% of the time/requires cueing 10 - 24% of the time. Needs helper to occlude trach/needs to repeat words.  Function - Social Interaction Social Interaction assist level: Interacts appropriately 50 - 74% of the time - May be physically or verbally inappropriate.  Function - Problem Solving Problem solving assist level: Solves basic 50 - 74% of the time/requires cueing 25 - 49% of the time  Function - Memory Memory assist level: Recognizes or recalls 50 - 74% of the time/requires cueing 25 - 49% of the time Patient normally able to recall (first 3 days only): Current season, That he or she is in a hospital  Medical Problem List and Plan:   1.  Right hemiparesis dysarthria/dysphagia secondary to left pontine infarct on 10/14/15 and left posterior lateral temporal infarct 8/27  Cont CIR, PT, OT, SLP  New stroke on 8/27, MRI reviewed with new stroke, spoke to Neurology, no changes in medication, possible Loop.  2.  DVT Prophylaxis/Anticoagulation: Subcutaneous heparin changed to lovenox. Monitor platelet count and any signs of bleeding 3. Pain Management: Tylenol as needed 4. Hypertension.   Lopressor discontinued due to  bradycardia.  Increased hydralazine 50 mg every 6 hours on 8/26, increased to 100 on 8/27  Norvasc 5 started 8/26  Will need to monitor closely, once off steroids  Will not make further changes at present and allow for permissive HTN, improving, goal SBP 130-150 Vitals:   10/29/15 0045 10/29/15 0454  BP: (!) 178/68 (!) 164/57  Pulse: 80 84  Resp:    Temp:  98 F (36.7 C)   5. Sinus arrest due to vagal spell. Beta blocker has been discontinued for now. No additional episodes. Follow-up per cardiology services. No plan at this time for pacemaker. 6. Neuropsych: This patient is not fully capable of making decisions on his own behalf./  Started on Zoloft 8/27, changed to Prozaac on 8/28, d/ced 8/31 due to ?nausea 7. Skin/Wound Care: Routine skin check 8. Fluids/Electrolytes/Nutrition: Routine I&O's  9. Chronic renal insufficiency. Baseline creatinine 1.5.   Cr. At baseline  Will cont to monitor   10. Diet-controlled diabetes mellitus. Hemoglobin A1c 7.3. Check blood sugars before meals and at bedtime. Diabetic teaching.  Monitor as activity increased  Will consider scheduling medications if needed after steroids complete CBG (last 3)   Recent Labs  10/28/15 1656 10/28/15 2200 10/29/15 0640  GLUCAP 195* 168* 169*  11. CAD/SVT. No chest pain or shortness of breath.  12. Gout.attack resolved Placed on colchicine 1 dose as well as prednisone 40 mg daily improving  (started 8/24), decreased to 47m on 8/29 x2 days, 140m8/31, will cont to wean 13. Hyperlipidemia. Pravachol 14. ?Hematuria: Workup unremarkable  Per pt.  Per nursing, no blood, however, pt states it was when he began urinating  UA relatively unremarkable  Hb stable 15. Subjective bleeding: No objective findings  Pt noted to have increased bleeding after heparin injection with self-reported hematuria   UA negative  Hemoccult negative 16. Urinary retention  Confirmed with PVRs  started bethanechol 10 TID,add  flomax UA neg 17. Nausea- appears to be related to constipation, discussed with RN will try dulc supp  SSRI d/ced  Bethanechol started    LOS (Days) 8 A  FACE TO FACE EVALUATION WAS PERFORMED  Teagen Bucio E 10/29/2015, 7:29 AM

## 2015-10-29 NOTE — Progress Notes (Signed)
Speech Language Pathology Weekly Progress and Session Note  Patient Details  Name: Nour Scalise MRN: [id number] Date of Birth: 05-28-1927  Beginning of progress report period:  October 22, 2015   End of progress report period: October 29, 2015   Today's Date: 10/29/2015 SLP Individual Time: 1450-1533 SLP Individual Time Calculation (min): 43 min   Short Term Goals: Week 1: SLP Short Term Goal 1 (Week 1): Pt will demonstrate efficient mastication and oral clearance of regular textures over 3 observed sessions to demonstrate readiness for diet advancement.  SLP Short Term Goal 1 - Progress (Week 1): Discontinued (comment) (discontinued due to chronic esophageal dysfunction) SLP Short Term Goal 2 (Week 1): Pt will demonstrate intellectual awareness within functional tasks with Mod A cues.  SLP Short Term Goal 2 - Progress (Week 1): Met SLP Short Term Goal 3 (Week 1): Pt will utilize speech intelligibility strategies at the simple conversation level with Min A verbal cues. to achieve greater than 90% intelligibility.  SLP Short Term Goal 3 - Progress (Week 1): Met SLP Short Term Goal 4 (Week 1): Pt will demonstrate functional problem solving for mildly complex tasks with Mod A verbal cues.  SLP Short Term Goal 4 - Progress (Week 1): Met    New Short Term Goals: Week 2: SLP Short Term Goal 1 (Week 2): Pt will demonstrate intellectual awareness within functional tasks with Min A cues.  SLP Short Term Goal 2 (Week 2): Pt will utilize speech intelligibility strategies at the simple conversation level with supervision cues. to achieve greater than 90% intelligibility over 3 targeted sessions SLP Short Term Goal 3 (Week 2): Pt will demonstrate functional problem solving for mildly complex tasks with Min A verbal cues.   Weekly Progress Updates: Pt with slow but functional gains this reporting period and has met 3 out of 3 targeted goals.  Goal for trials of regular textures discontinued due to chronic  esophageal dysfunction s/p tear and hemorrhage.  Pt has demonstrated improved functional problem solving, speech intelligibility, and awareness of limitations both physical and cognitive this reporting period and is overall min-mod assist for basic to semi-complex tasks.  Pt would continue to benefit from skilled ST while inpatient in order to maximize functional independence and reduce burden of care prior to discharge.  Pt and family education is ongoing.      Intensity: Minumum of 1-2 x/day, 30 to 90 minutes Frequency: 3 to 5 out of 7 days Duration/Length of Stay: 14 days Treatment/Interventions: Cognitive remediation/compensation;Cueing hierarchy;Dysphagia/aspiration precaution training;Internal/external aids;Functional tasks;Patient/family education;Therapeutic Exercise;Therapeutic Activities;Oral motor exercises;Speech/Language facilitation   Daily Session  Skilled Therapeutic Interventions: Pt was seen for skilled ST targeting cognitive goals.  SLP facilitated the session with a verbal sequencing task targeting reasoning and problem solving.  Pt initially required max assist multimodal cues for organization; however, as task progressed, therapist was able to fade cues to min-mod assist verbal and visual cues for 100% accuracy.  Pt much brighter and more alert today in comparison to yesterday's therapy session.  Per report, MD discontinued Prozac.  Pt requiring increased assistance today for speech intelligibility during conversations (min assist); suspect this is due to pt being more verbose and conversational today versus requiring encouragement to communicate as he has needed in recent therapy sessions.  Pt was left on commode per request at the end of today's therapy session.  Nurse tech made aware and daughters present. Goals updated on this date to reflect current progress and plan of care.  Function:   Eating Eating                 Cognition Comprehension Comprehension  assist level: Follows basic conversation/direction with extra time/assistive device  Expression   Expression assist level: Expresses basic 75 - 89% of the time/requires cueing 10 - 24% of the time. Needs helper to occlude trach/needs to repeat words.  Social Interaction Social Interaction assist level: Interacts appropriately 75 - 89% of the time - Needs redirection for appropriate language or to initiate interaction.  Problem Solving Problem solving assist level: Solves basic 50 - 74% of the time/requires cueing 25 - 49% of the time  Memory Memory assist level: Recognizes or recalls 50 - 74% of the time/requires cueing 25 - 49% of the time   General    Pain Pain Assessment Pain Assessment: No/denies pain   Therapy/Group: Individual Therapy  Amarilys Lyles, Selinda Orion 10/29/2015, 4:39 PM

## 2015-10-29 NOTE — Progress Notes (Signed)
Occupational Therapy Session Note  Patient Details  Name: [name] MRN: [id number] Date of Birth: [birth year]  Today's Date: 10/29/2015 OT Individual Time: [id number] OT Individual Time Calculation (min): 59 min     Short Term Goals: Week 2:  OT Short Term Goal 1 (Week 2): Pt will transfer to the elevated toilet with min assist using LRAD. OT Short Term Goal 2 (Week 2): Pt will complete all bathing with no more than min assist.   OT Short Term Goal 3 (Week 2): Pt will complete LB dressing with min assist using AE PRN. OT Short Term Goal 4 (Week 2): Pt will perform shower transfers with min assist to the walk-in shower for 3 consecutive sessions. OT Short Term Goal 5 (Week 2): Pt will use the RUE for all dressing tasks at a diminshed level with supervision.    Skilled Therapeutic Interventions/Progress Updates:    Pt ambulated to the shower and toilet with min hand held assist.  Increased initiation and attention noted this session compared to the last 2 days.  Pt still reporting nausea at times but not as distracting as before.  Toileting with min assist sit to stand.  He completed bathing with min assist overall but did not attempt washing feet this session. Mod instructional cueing to sequence steps for bathing and for thoroughness.   He uses the RUE for washing the LUE with min assist as well as for shaving using the electric razor.  Still demonstrates difficulty reaching his feet for donning pants and socks.  Will attempt to integrate AE as attention continues to improve but currently needs mod assist for threading clothing over B feet.  Mod instructional cueing also needed this session for orienting and sequencing donning shirt.  Pt left in wheelchair with daughter present and friends also.  Instructed daughter to let nursing know if she was stepping out of the room so safety belt could be placed.  She voiced understanding.     Therapy Documentation Precautions:  Precautions Precautions:  Fall Precaution Comments: right hemiparesis with increased pain in the left wrist and hand secondary to gout Restrictions Weight Bearing Restrictions: No  Pain: Pain Assessment Pain Assessment: No/denies pain ADL: See Function Navigator for Current Functional Status.   Therapy/Group: Individual Therapy  Hazelynn Mckenny OTR/L 10/29/2015, 12:53 PM

## 2015-10-30 ENCOUNTER — Inpatient Hospital Stay (HOSPITAL_COMMUNITY): Payer: Medicare Other | Admitting: Physical Therapy

## 2015-10-30 ENCOUNTER — Inpatient Hospital Stay (HOSPITAL_COMMUNITY): Payer: Medicare Other | Admitting: Occupational Therapy

## 2015-10-30 ENCOUNTER — Inpatient Hospital Stay (HOSPITAL_COMMUNITY): Payer: Medicare Other | Admitting: Speech Pathology

## 2015-10-30 LAB — GLUCOSE, CAPILLARY
GLUCOSE-CAPILLARY: 172 mg/dL — AB (ref 65–99)
GLUCOSE-CAPILLARY: 222 mg/dL — AB (ref 65–99)
Glucose-Capillary: 236 mg/dL — ABNORMAL HIGH (ref 65–99)
Glucose-Capillary: 172 mg/dL — ABNORMAL HIGH (ref 65–99)

## 2015-10-30 NOTE — Progress Notes (Signed)
Subjective/Complaints: Much brighter today "feel better", had BM yesterday, oral laxative  ROS-  +Right hand weakness, heaviness,Denies CP,SOB, Diarrhea  Objective: Vital Signs: Blood pressure (!) 156/73, pulse 82, temperature 98.2 F (36.8 C), temperature source Oral, resp. rate 17, height 5' 8"  (1.727 m), weight 79.2 kg (174 lb 9.7 oz), SpO2 95 %. Dg Abd 1 View  Result Date: 10/28/2015 CLINICAL DATA:  Two weeks of nausea without relief. No other complaints. EXAM: ABDOMEN - 1 VIEW COMPARISON:  Barium esophagram of October 21, 2015 FINDINGS: The colonic stool burden is moderate. There is contrast in the transverse, descending, and rectosigmoid portions of the colon from the previous barium esophagram. No small or large bowel obstructive pattern is observed. There are faint calcifications which project over the right renal fossa and to a lesser extent over the left renal fossa. There is calcification within the iliac arteries. There are pelvic phleboliths. IMPRESSION: No acute bowel abnormality is observed. Faint calcifications project over the renal fossae bilaterally which could reflect stones in the appropriate clinical setting. Given the patient's persistent symptoms, abdominal ultrasound or abdominal and pelvic CT scanning would be a useful next imaging step. Electronically Signed   By: David  Martinique M.D.   On: 10/28/2015 16:41   Results for orders placed or performed during the hospital encounter of 10/21/15 (from the past 72 hour(s))  Culture, Urine     Status: None   Collection Time: 10/27/15  9:22 AM  Result Value Ref Range   Specimen Description URINE, CATHETERIZED    Special Requests NONE    Culture NO GROWTH    Report Status 10/28/2015 FINAL   Urinalysis, Routine w reflex microscopic (not at [facility])     Status: Abnormal   Collection Time: 10/27/15  9:22 AM  Result Value Ref Range   Color, Urine YELLOW YELLOW   APPearance CLEAR CLEAR   Specific Gravity, Urine 1.021 1.005 - 1.030    pH 6.0 5.0 - 8.0   Glucose, UA 100 (A) NEGATIVE mg/dL   Hgb urine dipstick NEGATIVE NEGATIVE   Bilirubin Urine NEGATIVE NEGATIVE   Ketones, ur NEGATIVE NEGATIVE mg/dL   Protein, ur NEGATIVE NEGATIVE mg/dL   Nitrite NEGATIVE NEGATIVE   Leukocytes, UA NEGATIVE NEGATIVE    Comment: MICROSCOPIC NOT DONE ON URINES WITH NEGATIVE PROTEIN, BLOOD, LEUKOCYTES, NITRITE, OR GLUCOSE <1000 mg/dL.  Glucose, capillary     Status: Abnormal   Collection Time: 10/27/15 12:04 PM  Result Value Ref Range   Glucose-Capillary 218 (H) 65 - 99 mg/dL  Glucose, capillary     Status: Abnormal   Collection Time: 10/27/15  4:44 PM  Result Value Ref Range   Glucose-Capillary 325 (H) 65 - 99 mg/dL  Glucose, capillary     Status: Abnormal   Collection Time: 10/27/15  8:36 PM  Result Value Ref Range   Glucose-Capillary 200 (H) 65 - 99 mg/dL  Glucose, capillary     Status: Abnormal   Collection Time: 10/28/15  6:15 AM  Result Value Ref Range   Glucose-Capillary 168 (H) 65 - 99 mg/dL  Glucose, capillary     Status: Abnormal   Collection Time: 10/28/15 12:02 PM  Result Value Ref Range   Glucose-Capillary 171 (H) 65 - 99 mg/dL  Glucose, capillary     Status: Abnormal   Collection Time: 10/28/15  4:56 PM  Result Value Ref Range   Glucose-Capillary 195 (H) 65 - 99 mg/dL  Glucose, capillary     Status: Abnormal   Collection Time: 10/28/15  10:00 PM  Result Value Ref Range   Glucose-Capillary 168 (H) 65 - 99 mg/dL   Comment 1 Notify RN   Basic metabolic panel     Status: Abnormal   Collection Time: 10/29/15  4:33 AM  Result Value Ref Range   Sodium 135 135 - 145 mmol/L   Potassium 4.0 3.5 - 5.1 mmol/L   Chloride 102 101 - 111 mmol/L   CO2 25 22 - 32 mmol/L   Glucose, Bld 149 (H) 65 - 99 mg/dL   BUN 25 (H) 6 - 20 mg/dL   Creatinine, Ser 1.58 (H) 0.61 - 1.24 mg/dL   Calcium 9.1 8.9 - 10.3 mg/dL   GFR calc non Af Amer 38 (L) >60 mL/min   GFR calc Af Amer 44 (L) >60 mL/min    Comment: (NOTE) The eGFR has been  calculated using the CKD EPI equation. This calculation has not been validated in all clinical situations. eGFR's persistently <60 mL/min signify possible Chronic Kidney Disease.    Anion gap 8 5 - 15  CBC with Differential/Platelet     Status: Abnormal   Collection Time: 10/29/15  4:33 AM  Result Value Ref Range   WBC 11.3 (H) 4.0 - 10.5 K/uL   RBC 5.10 4.22 - 5.81 MIL/uL   Hemoglobin 15.2 13.0 - 17.0 g/dL   HCT 46.7 39.0 - 52.0 %   MCV 91.6 78.0 - 100.0 fL   MCH 29.8 26.0 - 34.0 pg   MCHC 32.5 30.0 - 36.0 g/dL   RDW 14.4 11.5 - 15.5 %   Platelets 216 150 - 400 K/uL   Neutrophils Relative % 69 %   Neutro Abs 7.8 (H) 1.7 - 7.7 K/uL   Lymphocytes Relative 18 %   Lymphs Abs 2.0 0.7 - 4.0 K/uL   Monocytes Relative 11 %   Monocytes Absolute 1.2 (H) 0.1 - 1.0 K/uL   Eosinophils Relative 2 %   Eosinophils Absolute 0.3 0.0 - 0.7 K/uL   Basophils Relative 0 %   Basophils Absolute 0.0 0.0 - 0.1 K/uL  Glucose, capillary     Status: Abnormal   Collection Time: 10/29/15  6:40 AM  Result Value Ref Range   Glucose-Capillary 169 (H) 65 - 99 mg/dL   Comment 1 Notify RN   Glucose, capillary     Status: Abnormal   Collection Time: 10/29/15 11:32 AM  Result Value Ref Range   Glucose-Capillary 209 (H) 65 - 99 mg/dL  Glucose, capillary     Status: Abnormal   Collection Time: 10/29/15  4:36 PM  Result Value Ref Range   Glucose-Capillary 233 (H) 65 - 99 mg/dL  Glucose, capillary     Status: Abnormal   Collection Time: 10/29/15  9:51 PM  Result Value Ref Range   Glucose-Capillary 167 (H) 65 - 99 mg/dL  Glucose, capillary     Status: Abnormal   Collection Time: 10/30/15  6:27 AM  Result Value Ref Range   Glucose-Capillary 172 (H) 65 - 99 mg/dL     HEENT: normocephalic, atraumatic.  Cardio: RRR and no murmur Resp: CTA B/L and unlabored GI: BS positive and NT, ND Skin:   Intact. Warm and dry. Neuro: Alert/Oriented Motor 4-/5 R delt, bi, tri, grip. Apraxia. Decreased [place]. (?effort) 4/5  R HF, KE ,4+/5 ADF/PF (?effort) LUE/LLE: 4+/5 proximal to distal Dysarthria Musc/Skel:  No edema. No erythema. Gen: NAD. Vitals signs reviewed.    Assessment/Plan: 1. Functional deficits secondary to Left pontine infarct and new left posterior  lateral temporal infarct which require 3+ hours per day of interdisciplinary therapy in a comprehensive inpatient rehab setting. Physiatrist is providing close team supervision and 24 hour management of active medical problems listed below. Physiatrist and rehab team continue to assess barriers to discharge/monitor patient progress toward functional and medical goals. FIM: Function - Bathing Position: Shower Body parts bathed by patient: Right arm, Left arm, Chest, Abdomen, Front perineal area, Right upper leg, Left upper leg, Buttocks Body parts bathed by helper: Right lower leg, Left lower leg, Back Assist Level: Touching or steadying assistance(Pt > 75%)  Function- Upper Body Dressing/Undressing What is the patient wearing?: Pull over shirt/dress Pull over shirt/dress - Perfomed by patient: Thread/unthread right sleeve, Thread/unthread left sleeve, Put head through opening Pull over shirt/dress - Perfomed by helper: Pull shirt over trunk Assist Level: Supervision or verbal cues Function - Lower Body Dressing/Undressing What is the patient wearing?: Pants, Non-skid slipper socks Position: Wheelchair/chair at sink Underwear - Performed by patient: Thread/unthread left underwear leg, Pull underwear up/down Underwear - Performed by helper: Pull underwear up/down, Thread/unthread left underwear leg, Thread/unthread right underwear leg Pants- Performed by patient: Thread/unthread right pants leg, Pull pants up/down Pants- Performed by helper: Thread/unthread left pants leg Non-skid slipper socks- Performed by helper: Don/doff right sock, Don/doff left sock TED Hose - Performed by helper: Don/doff right TED hose, Don/doff left TED hose Assist for  footwear: Dependant Assist for lower body dressing: Touching or steadying assistance (Pt > 75%)  Function - Toileting Toileting activity did not occur: Refused Toileting steps completed by patient: Adjust clothing prior to toileting, Performs perineal hygiene, Adjust clothing after toileting Toileting steps completed by helper: Adjust clothing prior to toileting, Performs perineal hygiene, Adjust clothing after toileting Toileting Assistive Devices: Grab bar or rail Assist level: Touching or steadying assistance (Pt.75%)  Function - Air cabin crew transfer assistive device: Grab bar Assist level to toilet: Touching or steadying assistance (Pt > 75%) Assist level from toilet: Touching or steadying assistance (Pt > 75%)  Function - Chair/bed transfer Chair/bed transfer method: Stand pivot Chair/bed transfer assist level: Touching or steadying assistance (Pt > 75%) Chair/bed transfer assistive device: Armrests Chair/bed transfer details: Manual facilitation for weight shifting, Verbal cues for sequencing, Verbal cues for technique  Function - Locomotion: Wheelchair Will patient use wheelchair at discharge?: Yes Type: Manual Max wheelchair distance: 150 Assist Level: Touching or steadying assistance (Pt > 75%) Assist Level: Touching or steadying assistance (Pt > 75%) Assist Level: Touching or steadying assistance (Pt > 75%) Turns around,maneuvers to table,bed, and toilet,negotiates 3% grade,maneuvers on rugs and over doorsills: No Function - Locomotion: Ambulation Assistive device: Walker-rolling Max distance: 16 Assist level: Touching or steadying assistance (Pt > 75%) Assist level: Touching or steadying assistance (Pt > 75%) Walk 50 feet with 2 turns activity did not occur: Safety/medical concerns Assist level: Moderate assist (Pt 50 - 74%) Walk 150 feet activity did not occur: Safety/medical concerns Assist level: Moderate assist (Pt 50 - 74%) Walk 10 feet on uneven  surfaces activity did not occur: Safety/medical concerns Assist level: Maximal assist (Pt 25 - 49%)  Function - Comprehension Comprehension: Auditory Comprehension assist level: Follows basic conversation/direction with extra time/assistive device  Function - Expression Expression: Verbal Expression assist level: Expresses basic 75 - 89% of the time/requires cueing 10 - 24% of the time. Needs helper to occlude trach/needs to repeat words.  Function - Social Interaction Social Interaction assist level: Interacts appropriately 75 - 89% of the time - Needs redirection for  appropriate language or to initiate interaction.  Function - Problem Solving Problem solving assist level: Solves basic 50 - 74% of the time/requires cueing 25 - 49% of the time  Function - Memory Memory assist level: Recognizes or recalls 75 - 89% of the time/requires cueing 10 - 24% of the time Patient normally able to recall (first 3 days only): Current season, That he or she is in a hospital  Medical Problem List and Plan:   1.  Right hemiparesis dysarthria/dysphagia secondary to left pontine infarct on 10/14/15 and left posterior lateral temporal infarct 8/27  Cont CIR, PT, OT, SLP- minG with PT yesterday, improved tolerance  New stroke on 8/27, MRI reviewed with new stroke, spoke to Neurology, no changes in medication, possible Loop.  2.  DVT Prophylaxis/Anticoagulation: Subcutaneous heparin changed to lovenox. Monitor platelet count and any signs of bleeding 3. Pain Management: Tylenol as needed 4. Hypertension.   Lopressor discontinued due to bradycardia.  Increased hydralazine 50 mg every 6 hours on 8/26, increased to 100 on 8/27  Norvasc 5 started 8/26  Will need to monitor closely, once off steroids  Will not make further changes at present and allow for permissive HTN, improving, goal SBP 130-150 Vitals:   10/30/15 0048 10/30/15 0520  BP: (!) 168/64 (!) 156/73  Pulse:  82  Resp:  17  Temp:  98.2 F  (36.8 C)   5. Sinus arrest due to vagal spell. Beta blocker has been discontinued for now. No additional episodes. Follow-up per cardiology services. No plan at this time for pacemaker. 6. Neuropsych: This patient is not fully capable of making decisions on his own behalf./  Started on Zoloft 8/27, changed to Prozaac on 8/28, d/ced 8/31 due to ?nausea 7. Skin/Wound Care: Routine skin check 8. Fluids/Electrolytes/Nutrition: Routine I&O's  9. Chronic renal insufficiency. Baseline creatinine 1.5.   Cr. At baseline  Will cont to monitor   10. Diet-controlled diabetes mellitus. Hemoglobin A1c 7.3. Check blood sugars before meals and at bedtime. Diabetic teaching.  Monitor as activity increased  D/C prednisone, expect some improvement with CBG CBG (last 3)   Recent Labs  10/29/15 1636 10/29/15 2151 10/30/15 0627  GLUCAP 233* 167* 172*  11. CAD/SVT. No chest pain or shortness of breath.  12. Gout.attack resolved Placed on colchicine 1 dose as well as prednisone  will d/c and monitor for reoccurance 13. Hyperlipidemia. Pravachol 14. ?Hematuria: Workup unremarkable  Per pt.  Per nursing, no blood, however, pt states it was when he began urinating  UA relatively unremarkable  Hb stable 15. Subjective bleeding: No objective findings  Pt noted to have increased bleeding after heparin injection with self-reported hematuria   UA negative  Hemoccult negative 16. Urinary retention  Confirmed with PVRs  started bethanechol 10 TID,add flomax- monitor for hypotension UA neg 17. Nausea- appears to be related to constipation, discussed with RN will try dulc supp  SSRI d/ced  Bethanechol started    LOS (Days) [place] E 10/30/2015, 6:40 AM

## 2015-10-30 NOTE — Progress Notes (Addendum)
Occupational Therapy Session Note  Patient Details  Name: [name] MRN: [id number] Date of Birth: 07-21-1927  Today's Date: 10/30/2015 OT Individual Time: 1117-1201 OT Individual Time Calculation (min): 44 min     Short Term Goals: Week 2:  OT Short Term Goal 1 (Week 2): Pt will transfer to the elevated toilet with min assist using LRAD. OT Short Term Goal 2 (Week 2): Pt will complete all bathing with no more than min assist.   OT Short Term Goal 3 (Week 2): Pt will complete LB dressing with min assist using AE PRN. OT Short Term Goal 4 (Week 2): Pt will perform shower transfers with min assist to the walk-in shower for 3 consecutive sessions. OT Short Term Goal 5 (Week 2): Pt will use the RUE for all dressing tasks at a diminshed level with supervision.    Skilled Therapeutic Interventions/Progress Updates:    Session 1:  Pt worked on bathing sit to stand at the sink.  Still with reports of slight dizziness but he says "it comes and goes".  Transferred to the wheelchair with min assist stand pivot with hand held assist.  Worked on UB bathing with supervision sitting in the wheelchair.  Pt donned new pullover shirt with setup as well.  Completed washing peri area and donning new brief with min assist for balance.  He was able to complete pulling up pants over hips with min assist.  He was able to use the RUE during session with supervision for washing the RUE with the LUE.  Pt still perseverating on feeling bad but not reporting as much nausea as previous sessions.    Session 2:  [id number])  Rolled pt down to the therapy gym and transferred to the therapy mat with min assist.  In sitting had pt work on FM coordination task of picking up the medium sized wooden pegs with the RUE and placing in grid.  He was unable to manipulate the peg in his hand if he picked it up in a position that did not allow for him to place it.  He would use the table to help rotate the peg and then place it into its  proper spot.  Noted pt with increased cervical protraction and flexion during task with rounded shoulders bilaterally.  Mod facilitation to keep from hiking the right shoulder when attempting to reach with the RUE.  Last part of session, had pt lay in supine with towel rolled under his upper shoulders.  Worked on cervical stretching and ROM in rotation, lateral flexion, and capital extension.  Pt returned to room at end of session with safety belt in place and call button in reach.    Therapy Documentation Precautions: Precautions Precautions: Fall Precaution Comments: right hemiparesis Restrictions Weight Bearing Restrictions: No  Pain: Pain Assessment Pain Assessment: No/denies pain ADL:  See Function Navigator for Current Functional Status.   Therapy/Group: Individual Therapy  Imagine Nest OTR/L 10/30/2015, 1:04 PM

## 2015-10-30 NOTE — Progress Notes (Signed)
Speech Language Pathology Daily Session Note  Patient Details  Name: [name] MRN: [id number] Date of Birth: [birth year]  Today's Date: 10/30/2015 SLP Individual Time: [id number] SLP Individual Time Calculation (min): 45 min   Short Term Goals: Week 2: SLP Short Term Goal 1 (Week 2): Pt will demonstrate intellectual awareness within functional tasks with Min A cues.  SLP Short Term Goal 2 (Week 2): Pt will utilize speech intelligibility strategies at the simple conversation level with supervision cues. to achieve greater than 90% intelligibility over 3 targeted sessions SLP Short Term Goal 3 (Week 2): Pt will demonstrate functional problem solving for mildly complex tasks with Min A verbal cues.   Skilled Therapeutic Interventions: Skilled treatment session focused on cognitive goals. SLP facilitated session by providing Mod A verbal cues for functional problem solving during a basic money management task and Max A verbal and visual cues for working memory and recall throughout task. Patient demonstrated appropriate intellectual awareness of cognitive deficits and consistently reported "my mind isn't right, it keeps wandering everywhere." Patient provided encouragement and education in regards to goals of SLP intervention. Patient required supervision question and verbal cues for orientation to place, time and situation and was 100% intelligible throughout tasks. Patient left upright in wheelchair with quick release belt in place and all needs within reach. Continue with current plan of care.   Function:  Eating Eating     Eating Assist Level: More than reasonable amount of time           Cognition Comprehension Comprehension assist level: Follows basic conversation/direction with extra time/assistive device  Expression   Expression assist level: Expresses basic 75 - 89% of the time/requires cueing 10 - 24% of the time. Needs helper to occlude trach/needs to repeat words.  Social  Interaction Social Interaction assist level: Interacts appropriately with others - No medications needed.  Problem Solving Problem solving assist level: Solves basic 50 - 74% of the time/requires cueing 25 - 49% of the time  Memory Memory assist level: Recognizes or recalls 50 - 74% of the time/requires cueing 25 - 49% of the time    Pain Pain in neck, RN made aware and administered pain medications and heat pack.   Therapy/Group: Individual Therapy  Rachella Basden 10/30/2015, 3:31 PM

## 2015-10-30 NOTE — Progress Notes (Signed)
Physical Therapy Note  Patient Details  Name: Xyion Virden MRN: [id number] Date of Birth: 05/15/1927 Today's Date: 10/30/2015    Time: [id number] minutes  1:1 No c/o pain. Pt still with c/o neck "stiffness", but no pain noted. Pt states throughout session "my mind feels jumpy, I can't keep focused on anything".  Pt demos improved awareness and PT attempted to educate pt throughout the session about effects of having a stroke. Pt still perseverative on "my mind just isn't right".  Gait 175' x 2 with RW, standing rest breaks but improved activity tolerance noted. Standing bend and reach task with min A for balance, standing horseshoe toss with supervision for balance with 1 UE support.  Supine therex with focus on improving mobility and strength with LTR, bridging, supine knee and hip flex ext, trunk PNF with resistance bilat.  Pt improves mobility with treatment but continues to be limited by decreased trunk and spine mobility.   Sherelle Castelli 10/30/2015, 3:01 PM

## 2015-10-31 ENCOUNTER — Inpatient Hospital Stay (HOSPITAL_COMMUNITY): Payer: Medicare Other

## 2015-10-31 LAB — BASIC METABOLIC PANEL
ANION GAP: 12 (ref 5–15)
BUN: 28 mg/dL — ABNORMAL HIGH (ref 6–20)
CO2: 20 mmol/L — AB (ref 22–32)
Calcium: 9.4 mg/dL (ref 8.9–10.3)
Chloride: 100 mmol/L — ABNORMAL LOW (ref 101–111)
Creatinine, Ser: 1.66 mg/dL — ABNORMAL HIGH (ref 0.61–1.24)
GFR calc Af Amer: 41 mL/min — ABNORMAL LOW (ref >60)
GFR calc non Af Amer: 36 mL/min — ABNORMAL LOW (ref >60)
GLUCOSE: 291 mg/dL — AB (ref 65–99)
POTASSIUM: 4 mmol/L (ref 3.5–5.1)
Sodium: 132 mmol/L — ABNORMAL LOW (ref 135–145)

## 2015-10-31 LAB — CBC
HEMATOCRIT: 48.7 % (ref 39.0–52.0)
HEMOGLOBIN: 16.5 g/dL (ref 13.0–17.0)
MCH: 30.5 pg (ref 26.0–34.0)
MCHC: 33.9 g/dL (ref 30.0–36.0)
MCV: 90 fL (ref 78.0–100.0)
Platelets: 194 10*3/uL (ref 150–400)
RBC: 5.41 MIL/uL (ref 4.22–5.81)
RDW: 14 % (ref 11.5–15.5)
WBC: 10.6 10*3/uL — ABNORMAL HIGH (ref 4.0–10.5)

## 2015-10-31 LAB — GLUCOSE, CAPILLARY
Glucose-Capillary: 159 mg/dL — ABNORMAL HIGH (ref 65–99)
Glucose-Capillary: 196 mg/dL — ABNORMAL HIGH (ref 65–99)
Glucose-Capillary: 173 mg/dL — ABNORMAL HIGH (ref 65–99)

## 2015-10-31 MED ORDER — AMLODIPINE BESYLATE 5 MG PO TABS
5.0000 mg | ORAL_TABLET | Freq: Once | ORAL | Status: AC
  Administered 2015-10-31: 5 mg via ORAL
  Filled 2015-10-31: qty 1

## 2015-10-31 MED ORDER — SORBITOL 70 % SOLN
30.0000 mL | Status: AC
  Administered 2015-10-31: 30 mL via ORAL
  Filled 2015-10-31: qty 30

## 2015-10-31 MED ORDER — AMLODIPINE BESYLATE 10 MG PO TABS
10.0000 mg | ORAL_TABLET | Freq: Every day | ORAL | Status: DC
  Administered 2015-11-01 – 2015-11-11 (×11): 10 mg via ORAL
  Filled 2015-10-31 (×11): qty 1

## 2015-10-31 MED ORDER — FLEET ENEMA 7-19 GM/118ML RE ENEM: 1.0000 | ENEMA | Freq: Every day | RECTAL | Status: DC | PRN

## 2015-10-31 NOTE — Progress Notes (Signed)
At 2035 patient family concerned about pt status of feeling worn out and tired and short of breath. Pt says multiple times "give me something to knock me out" (refurring to sleep), and short of breath. VS taken and stable. Family at the bedside and has concerns of a new medication that the patient began yesterday Flomax 0.4 mg has something to do with the patient status, and would like for the nurse to let the MD know in the morning. After family left except for one daughter pt fell asleep without the need for a sleep-aid. Appears to be resting comfortably. Cont plan of care. Shahara Hartsfield, Dione Plover

## 2015-10-31 NOTE — Progress Notes (Addendum)
Physical Therapy Note  Patient Details  Name: Art Mcgloin MRN: [id number] Date of Birth: 1927-05-09 Today's Date: 10/31/2015  0950-1030, 40 min individual  Pain: none reported Pt c/o vertigo with head movement  neuromuscular re-education via forced use, multimodal cues for w/c propulsion using bil UEs x 150' with min assist for steering. Seated R/L long arc quad knee ext, heel raises, marching x 8 reps each. Gait through obstacle course with extra time, min/mod assist for steering RW though tight spots where he needed to turn sideways, x 150', and partway back to room, min assist. Gait on 3" high steps with step through method ascending and step to method descending, which dtr stated he did before due to RLE circulation issues. Started family ed with dtr [name] for gait on level tile and sit>< stand with RW. Pt left resting in w/c with quick release belt applied and all needs within reach.  PT will request vestibular eval.  Shaunae Sieloff 10/31/2015, 7:52 AM

## 2015-10-31 NOTE — Progress Notes (Signed)
Subjective/Complaints: Daughter at bedside and concerned about father's fatigue. I asked her how he looked this morning comparatively to other mornings and she replied "about the same." pt states hes' nauseas. Also he's constipated  ROS-  +Right hand weakness, heaviness,Denies CP,SOB,    Objective: Vital Signs: Blood pressure (!) 159/62, pulse 92, temperature 98.2 F (36.8 C), temperature source Oral, resp. rate 17, height _0  (1.727 m), weight 79.2 kg (174 lb 9.7 oz), SpO2 98 %. No results found. Results for orders placed or performed during the hospital encounter of 10/21/15 (from the past 72 hour(s))  Glucose, capillary     Status: Abnormal   Collection Time: 10/28/15 12:02 PM  Result Value Ref Range   Glucose-Capillary 171 (H) 65 - 99 mg/dL  Glucose, capillary     Status: Abnormal   Collection Time: 10/28/15  4:56 PM  Result Value Ref Range   Glucose-Capillary 195 (H) 65 - 99 mg/dL  Glucose, capillary     Status: Abnormal   Collection Time: 10/28/15 10:00 PM  Result Value Ref Range   Glucose-Capillary 168 (H) 65 - 99 mg/dL   Comment 1 Notify RN   Basic metabolic panel     Status: Abnormal   Collection Time: 10/29/15  4:33 AM  Result Value Ref Range   Sodium 135 135 - 145 mmol/L   Potassium 4.0 3.5 - 5.1 mmol/L   Chloride 102 101 - 111 mmol/L   CO2 25 22 - 32 mmol/L   Glucose, Bld 149 (H) 65 - 99 mg/dL   BUN 25 (H) 6 - 20 mg/dL   Creatinine, Ser 1.58 (H) 0.61 - 1.24 mg/dL   Calcium 9.1 8.9 - 10.3 mg/dL   GFR calc non Af Amer 38 (L) >60 mL/min   GFR calc Af Amer 44 (L) >60 mL/min    Comment: (NOTE) The eGFR has been calculated using the CKD EPI equation. This calculation has not been validated in all clinical situations. eGFR's persistently <60 mL/min signify possible Chronic Kidney Disease.    Anion gap 8 5 - 15  CBC with Differential/Platelet     Status: Abnormal   Collection Time: 10/29/15  4:33 AM  Result Value Ref Range   WBC 11.3 (H) 4.0 - 10.5 K/uL   RBC  5.10 4.22 - 5.81 MIL/uL   Hemoglobin 15.2 13.0 - 17.0 g/dL   HCT 46.7 39.0 - 52.0 %   MCV 91.6 78.0 - 100.0 fL   MCH 29.8 26.0 - 34.0 pg   MCHC 32.5 30.0 - 36.0 g/dL   RDW 14.4 11.5 - 15.5 %   Platelets 216 150 - 400 K/uL   Neutrophils Relative % 69 %   Neutro Abs 7.8 (H) 1.7 - 7.7 K/uL   Lymphocytes Relative 18 %   Lymphs Abs 2.0 0.7 - 4.0 K/uL   Monocytes Relative 11 %   Monocytes Absolute 1.2 (H) 0.1 - 1.0 K/uL   Eosinophils Relative 2 %   Eosinophils Absolute 0.3 0.0 - 0.7 K/uL   Basophils Relative 0 %   Basophils Absolute 0.0 0.0 - 0.1 K/uL  Glucose, capillary     Status: Abnormal   Collection Time: 10/29/15  6:40 AM  Result Value Ref Range   Glucose-Capillary 169 (H) 65 - 99 mg/dL   Comment 1 Notify RN   Glucose, capillary     Status: Abnormal   Collection Time: 10/29/15 11:32 AM  Result Value Ref Range   Glucose-Capillary 209 (H) 65 - 99 mg/dL  Glucose,  capillary     Status: Abnormal   Collection Time: 10/29/15  4:36 PM  Result Value Ref Range   Glucose-Capillary 233 (H) 65 - 99 mg/dL  Glucose, capillary     Status: Abnormal   Collection Time: 10/29/15  9:51 PM  Result Value Ref Range   Glucose-Capillary 167 (H) 65 - 99 mg/dL  Glucose, capillary     Status: Abnormal   Collection Time: 10/30/15  6:27 AM  Result Value Ref Range   Glucose-Capillary 172 (H) 65 - 99 mg/dL  Glucose, capillary     Status: Abnormal   Collection Time: 10/30/15 11:36 AM  Result Value Ref Range   Glucose-Capillary 236 (H) 65 - 99 mg/dL  Glucose, capillary     Status: Abnormal   Collection Time: 10/30/15  4:48 PM  Result Value Ref Range   Glucose-Capillary 172 (H) 65 - 99 mg/dL  Glucose, capillary     Status: Abnormal   Collection Time: 10/30/15  9:17 PM  Result Value Ref Range   Glucose-Capillary 222 (H) 65 - 99 mg/dL  Glucose, capillary     Status: Abnormal   Collection Time: 10/31/15  6:49 AM  Result Value Ref Range   Glucose-Capillary 159 (H) 65 - 99 mg/dL     HEENT:  normocephalic, atraumatic.  Cardio: RRR and no murmur Resp: CTA B/L and unlabored GI: BS positive and NT, ND Skin:   Intact. Warm and dry. Neuro: drowsy but arousable  Motor 4-/5 R delt, bi, tri, grip. Apraxia. Decreased [place]. (?effort) 4/5 R HF, KE ,4+/5 ADF/PF (?effort) LUE/LLE: 4+/5 proximal to distal Dysarthria Musc/Skel:  No edema. No erythema. Gen: NAD. Vitals signs reviewed.    Assessment/Plan: 1. Functional deficits secondary to Left pontine infarct and new left posterior lateral temporal infarct which require 3+ hours per day of interdisciplinary therapy in a comprehensive inpatient rehab setting. Physiatrist is providing close team supervision and 24 hour management of active medical problems listed below. Physiatrist and rehab team continue to assess barriers to discharge/monitor patient progress toward functional and medical goals. FIM: Function - Bathing Position: Wheelchair/chair at sink Body parts bathed by patient: Right arm, Left arm, Chest, Abdomen, Front perineal area, Buttocks Body parts bathed by helper: Back Bathing not applicable: Right upper leg, Left upper leg, Right lower leg, Left lower leg (Did not attempt this session) Assist Level: Touching or steadying assistance(Pt > 75%)  Function- Upper Body Dressing/Undressing What is the patient wearing?: Pull over shirt/dress Pull over shirt/dress - Perfomed by patient: Thread/unthread right sleeve, Thread/unthread left sleeve, Put head through opening, Pull shirt over trunk Pull over shirt/dress - Perfomed by helper: Pull shirt over trunk Assist Level: Supervision or verbal cues Function - Lower Body Dressing/Undressing What is the patient wearing?: Pants, Non-skid slipper socks Position: Wheelchair/chair at sink Underwear - Performed by patient: Thread/unthread left underwear leg, Pull underwear up/down Underwear - Performed by helper: Pull underwear up/down, Thread/unthread left underwear leg, Thread/unthread  right underwear leg Pants- Performed by patient: Thread/unthread right pants leg, Pull pants up/down Pants- Performed by helper: Thread/unthread left pants leg Non-skid slipper socks- Performed by helper: Don/doff right sock, Don/doff left sock TED Hose - Performed by helper: Don/doff right TED hose, Don/doff left TED hose Assist for footwear: Dependant Assist for lower body dressing: Touching or steadying assistance (Pt > 75%)  Function - Toileting Toileting activity did not occur: Refused Toileting steps completed by patient: Adjust clothing prior to toileting, Performs perineal hygiene, Adjust clothing after toileting Toileting steps completed by  helper: Adjust clothing prior to toileting, Performs perineal hygiene, Adjust clothing after toileting Toileting Assistive Devices: Grab bar or rail Assist level: Touching or steadying assistance (Pt.75%)  Function - Toilet Transfers Toilet transfer assistive device: Grab bar Assist level to toilet: Touching or steadying assistance (Pt > 75%) Assist level from toilet: Touching or steadying assistance (Pt > 75%)  Function - Chair/bed transfer Chair/bed transfer method: Stand pivot Chair/bed transfer assist level: Touching or steadying assistance (Pt > 75%) Chair/bed transfer assistive device: Armrests Chair/bed transfer details: Manual facilitation for weight shifting, Verbal cues for sequencing, Verbal cues for technique  Function - Locomotion: Wheelchair Will patient use wheelchair at discharge?: Yes Type: Manual Max wheelchair distance: 150 Assist Level: Touching or steadying assistance (Pt > 75%) Assist Level: Touching or steadying assistance (Pt > 75%) Assist Level: Touching or steadying assistance (Pt > 75%) Turns around,maneuvers to table,bed, and toilet,negotiates 3% grade,maneuvers on rugs and over doorsills: No Function - Locomotion: Ambulation Assistive device: Walker-rolling Max distance: 16 Assist level: Touching or  steadying assistance (Pt > 75%) Assist level: Touching or steadying assistance (Pt > 75%) Walk 50 feet with 2 turns activity did not occur: Safety/medical concerns Assist level: Moderate assist (Pt 50 - 74%) Walk 150 feet activity did not occur: Safety/medical concerns Assist level: Moderate assist (Pt 50 - 74%) Walk 10 feet on uneven surfaces activity did not occur: Safety/medical concerns Assist level: Maximal assist (Pt 25 - 49%)  Function - Comprehension Comprehension: Auditory Comprehension assist level: Follows basic conversation/direction with extra time/assistive device  Function - Expression Expression: Verbal Expression assist level: Expresses basic 75 - 89% of the time/requires cueing 10 - 24% of the time. Needs helper to occlude trach/needs to repeat words.  Function - Social Interaction Social Interaction assist level: Interacts appropriately with others - No medications needed.  Function - Problem Solving Problem solving assist level: Solves basic 50 - 74% of the time/requires cueing 25 - 49% of the time  Function - Memory Memory assist level: Recognizes or recalls 50 - 74% of the time/requires cueing 25 - 49% of the time Patient normally able to recall (first 3 days only): Current season, That he or she is in a hospital  Medical Problem List and Plan:   1.  Right hemiparesis dysarthria/dysphagia secondary to left pontine infarct on 10/14/15 and left posterior lateral temporal infarct 8/27  Cont CIR, PT, OT, SLP- minG with PT yesterday, improved tolerance  New stroke on 8/27, MRI reviewed with new stroke, spoke to Neurology, no changes in medication, possible Loop.  2.  DVT Prophylaxis/Anticoagulation: Subcutaneous heparin changed to lovenox. Monitor platelet count and any signs of bleeding 3. Pain Management: Tylenol as needed 4. Hypertension.   Lopressor discontinued due to bradycardia.  Increased hydralazine 50 mg every 6 hours on 8/26, increased to 100 on  8/27  Norvasc 5 started 8/26  Will need to monitor closely, once off steroids  Will not make further changes at present and allow for permissive HTN, improving, goal SBP 130-150 Vitals:   10/31/15 0002 10/31/15 0500  BP: (!) 161/56 (!) 159/62  Pulse: 95 92  Resp:    Temp:  98.2 F (36.8 C)   5. Sinus arrest due to vagal spell. Beta blocker has been discontinued for now. No additional episodes. Follow-up per cardiology services. No plan at this time for pacemaker. 6. Neuropsych: This patient is not fully capable of making decisions on his own behalf./  Started on Zoloft 8/27, changed to Prozaac on 8/28, d/ced  8/31 due to ?nausea 7. Skin/Wound Care: Routine skin check 8. Fluids/Electrolytes/Nutrition: Routine I&O's  9. Chronic renal insufficiency. Baseline creatinine 1.5.   Cr. At baseline  Will cont to monitor   10. Diet-controlled diabetes mellitus. Hemoglobin A1c 7.3. Check blood sugars before meals and at bedtime. Diabetic teaching.  Monitor as activity increased  Now off prednisone. Has had some improvement with CBG CBG (last 3)   Recent Labs  10/30/15 1648 10/30/15 2117 10/31/15 0649  GLUCAP 172* 222* 159*  11. CAD/SVT. No chest pain or shortness of breath.  12. Gout.attack resolved Placed on colchicine 1 dose as well as prednisone  will d/c and monitor for reoccurance 13. Hyperlipidemia. Pravachol 14. ?Hematuria: Workup unremarkable  Per pt.  Per nursing, no blood, however, pt states it was when he began urinating  UA relatively unremarkable  Hb stable 15. Subjective bleeding: No objective findings  Pt noted to have increased bleeding after heparin injection with self-reported hematuria   UA negative  Hemoccult negative 16. Urinary retention  Confirmed with PVRs  started bethanechol 10 TID,  -will hold flomax due to family concerns.  UA neg 17. Nausea- appears to be related to constipation, discussed with RN will try dulc supp  -sorbitol today +supp, fleet if  needed  Bethanechol started 18. Fatigue: likely multifactorial related to age/stroke  -will also check labs, weight  -needs better control of DM as well.  -recent UCX negative  -stopped flomax as above  -limit neurosedating meds   LOS (Days) 10 A FACE TO FACE EVALUATION WAS PERFORMED  Scharlene Catalina T 10/31/2015, 10:33 AM

## 2015-10-31 NOTE — Progress Notes (Signed)
Dr. Naaman Plummer notified of blood pressures taken today and patient reports of not feeling "right."   C/o being unable to focus and mouth feeling swollen, but is not and also numbness on R. Side of head.  Patient able to identify areas of head touched with his eyes closed.  No other changes noted in condition.  Continuing to monitor for any other changes in condition.

## 2015-11-01 ENCOUNTER — Inpatient Hospital Stay (HOSPITAL_COMMUNITY): Payer: Medicare Other | Admitting: Physical Therapy

## 2015-11-01 DIAGNOSIS — E1140 Type 2 diabetes mellitus with diabetic neuropathy, unspecified: Secondary | ICD-10-CM

## 2015-11-01 LAB — URINALYSIS, ROUTINE W REFLEX MICROSCOPIC
Bilirubin Urine: NEGATIVE
Glucose, UA: 500 mg/dL — AB
Hgb urine dipstick: NEGATIVE
Ketones, ur: NEGATIVE mg/dL
NITRITE: NEGATIVE
Protein, ur: NEGATIVE mg/dL
SPECIFIC GRAVITY, URINE: 1.025 (ref 1.005–1.030)
pH: 5.5 (ref 5.0–8.0)

## 2015-11-01 LAB — URINE MICROSCOPIC-ADD ON

## 2015-11-01 LAB — GLUCOSE, CAPILLARY
GLUCOSE-CAPILLARY: 203 mg/dL — AB (ref 65–99)
GLUCOSE-CAPILLARY: 158 mg/dL — AB (ref 65–99)
Glucose-Capillary: 171 mg/dL — ABNORMAL HIGH (ref 65–99)
Glucose-Capillary: 199 mg/dL — ABNORMAL HIGH (ref 65–99)

## 2015-11-01 MED ORDER — GLIMEPIRIDE 2 MG PO TABS
1.0000 mg | ORAL_TABLET | Freq: Every day | ORAL | Status: DC
  Administered 2015-11-01 – 2015-11-02 (×2): 1 mg via ORAL
  Filled 2015-11-01 (×2): qty 1

## 2015-11-01 NOTE — Progress Notes (Signed)
Physical Therapy Note  Patient Details  Name: [name] MRN: [id number] Date of Birth: [birth year] Today's Date: 11/01/2015    Time: 1100-1155 55 minutes  1:1 Pt with no c/o pain, continues with c/o nausea.  Pt performed gait  200' x 2 with close supervision, pt with 1 episode of knee buckling with LOB requiring min A to correct at end of gait at the end of the session, most likely due to fatigue.  Stair negotiation x 4 steps with 1 handrail. Pt's daughter encouraged to have at least 1 railing installed to assist with home entry.  Standing clothespin task for Rt UE strength and coordination. Pt able to stand x 4 minutes before needing to sit due to fatigue.  Discussed energy conservation and importance of continued physical activity with pt/daughter, both verbally agree.   Aayden Cefalu 11/01/2015, 8:21 PM

## 2015-11-01 NOTE — Progress Notes (Signed)
Subjective/Complaints: Pt states nausea a little better today. Having "tingling" right arm and leg---seems to be intermittent.   ROS-  +Right hand tingling, heaviness,Denies CP,SOB,    Objective: Vital Signs: Blood pressure (!) 170/61, pulse 92, temperature 98.2 F (36.8 C), temperature source Oral, resp. rate 18, height _0  (1.727 m), weight 77.8 kg (171 lb 8.3 oz), SpO2 97 %. No results found. Results for orders placed or performed during the hospital encounter of 10/21/15 (from the past 72 hour(s))  Glucose, capillary     Status: Abnormal   Collection Time: 10/29/15  4:36 PM  Result Value Ref Range   Glucose-Capillary 233 (H) 65 - 99 mg/dL  Glucose, capillary     Status: Abnormal   Collection Time: 10/29/15  9:51 PM  Result Value Ref Range   Glucose-Capillary 167 (H) 65 - 99 mg/dL  Glucose, capillary     Status: Abnormal   Collection Time: 10/30/15  6:27 AM  Result Value Ref Range   Glucose-Capillary 172 (H) 65 - 99 mg/dL  Glucose, capillary     Status: Abnormal   Collection Time: 10/30/15 11:36 AM  Result Value Ref Range   Glucose-Capillary 236 (H) 65 - 99 mg/dL  Glucose, capillary     Status: Abnormal   Collection Time: 10/30/15  4:48 PM  Result Value Ref Range   Glucose-Capillary 172 (H) 65 - 99 mg/dL  Glucose, capillary     Status: Abnormal   Collection Time: 10/30/15  9:17 PM  Result Value Ref Range   Glucose-Capillary 222 (H) 65 - 99 mg/dL  Glucose, capillary     Status: Abnormal   Collection Time: 10/31/15  6:49 AM  Result Value Ref Range   Glucose-Capillary 159 (H) 65 - 99 mg/dL  Basic metabolic panel     Status: Abnormal   Collection Time: 10/31/15 11:18 AM  Result Value Ref Range   Sodium 132 (L) 135 - 145 mmol/L   Potassium 4.0 3.5 - 5.1 mmol/L   Chloride 100 (L) 101 - 111 mmol/L   CO2 20 (L) 22 - 32 mmol/L   Glucose, Bld 291 (H) 65 - 99 mg/dL   BUN 28 (H) 6 - 20 mg/dL   Creatinine, Ser 1.66 (H) 0.61 - 1.24 mg/dL   Calcium 9.4 8.9 - 10.3 mg/dL    GFR calc non Af Amer 36 (L) >60 mL/min   GFR calc Af Amer 41 (L) >60 mL/min    Comment: (NOTE) The eGFR has been calculated using the CKD EPI equation. This calculation has not been validated in all clinical situations. eGFR's persistently <60 mL/min signify possible Chronic Kidney Disease.    Anion gap 12 5 - 15  CBC     Status: Abnormal   Collection Time: 10/31/15 11:18 AM  Result Value Ref Range   WBC 10.6 (H) 4.0 - 10.5 K/uL   RBC 5.41 4.22 - 5.81 MIL/uL   Hemoglobin 16.5 13.0 - 17.0 g/dL   HCT 48.7 39.0 - 52.0 %   MCV 90.0 78.0 - 100.0 fL   MCH 30.5 26.0 - 34.0 pg   MCHC 33.9 30.0 - 36.0 g/dL   RDW 14.0 11.5 - 15.5 %   Platelets 194 150 - 400 K/uL  Glucose, capillary     Status: Abnormal   Collection Time: 10/31/15  4:25 PM  Result Value Ref Range   Glucose-Capillary 196 (H) 65 - 99 mg/dL   Comment 1 Notify RN   Glucose, capillary     Status: Abnormal  Collection Time: 10/31/15  9:28 PM  Result Value Ref Range   Glucose-Capillary 173 (H) 65 - 99 mg/dL  Glucose, capillary     Status: Abnormal   Collection Time: 11/01/15  6:38 AM  Result Value Ref Range   Glucose-Capillary 171 (H) 65 - 99 mg/dL     HEENT: normocephalic, atraumatic. More alert  Cardio: RRR and no murmur Resp: CTA B/L and unlabored GI: BS positive and NT, ND Skin:   Intact. Warm and dry. Neuro: drowsy but arousable  Motor 4-/5 R delt, bi, tri, grip. Apraxia. Decreased [place]. (?effort) 4/5 R HF, KE ,4+/5 ADF/PF (?effort) LUE/LLE: 4+/5 proximal to distal Dysarthria I SEE NO CHANGES IN EXAM Musc/Skel:  No edema. No erythema. Gen: NAD. Vitals signs reviewed.    Assessment/Plan: 1. Functional deficits secondary to Left pontine infarct and new left posterior lateral temporal infarct which require 3+ hours per day of interdisciplinary therapy in a comprehensive inpatient rehab setting. Physiatrist is providing close team supervision and 24 hour management of active medical problems listed  below. Physiatrist and rehab team continue to assess barriers to discharge/monitor patient progress toward functional and medical goals. FIM: Function - Bathing Position: Wheelchair/chair at sink Body parts bathed by patient: Right arm, Left arm, Chest, Abdomen, Front perineal area, Buttocks Body parts bathed by helper: Back Bathing not applicable: Right upper leg, Left upper leg, Right lower leg, Left lower leg (Did not attempt this session) Assist Level: Touching or steadying assistance(Pt > 75%)  Function- Upper Body Dressing/Undressing What is the patient wearing?: Pull over shirt/dress Pull over shirt/dress - Perfomed by patient: Thread/unthread right sleeve, Thread/unthread left sleeve, Put head through opening, Pull shirt over trunk Pull over shirt/dress - Perfomed by helper: Pull shirt over trunk Assist Level: Supervision or verbal cues Function - Lower Body Dressing/Undressing What is the patient wearing?: Pants, Non-skid slipper socks Position: Wheelchair/chair at sink Underwear - Performed by patient: Thread/unthread left underwear leg, Pull underwear up/down Underwear - Performed by helper: Pull underwear up/down, Thread/unthread left underwear leg, Thread/unthread right underwear leg Pants- Performed by patient: Thread/unthread right pants leg, Pull pants up/down Pants- Performed by helper: Thread/unthread left pants leg Non-skid slipper socks- Performed by helper: Don/doff right sock, Don/doff left sock TED Hose - Performed by helper: Don/doff right TED hose, Don/doff left TED hose Assist for footwear: Dependant Assist for lower body dressing: Touching or steadying assistance (Pt > 75%)  Function - Toileting Toileting activity did not occur: Refused Toileting steps completed by patient: Adjust clothing prior to toileting, Performs perineal hygiene, Adjust clothing after toileting Toileting steps completed by helper: Adjust clothing prior to toileting, Performs perineal  hygiene, Adjust clothing after toileting Toileting Assistive Devices: Grab bar or rail Assist level: Touching or steadying assistance (Pt.75%)  Function - Air cabin crew transfer assistive device: Grab bar Assist level to toilet: Touching or steadying assistance (Pt > 75%) Assist level from toilet: Touching or steadying assistance (Pt > 75%)  Function - Chair/bed transfer Chair/bed transfer method: Stand pivot Chair/bed transfer assist level: Touching or steadying assistance (Pt > 75%) Chair/bed transfer assistive device: Armrests, Walker Chair/bed transfer details: Manual facilitation for weight shifting, Verbal cues for sequencing, Verbal cues for technique  Function - Locomotion: Wheelchair Will patient use wheelchair at discharge?: Yes Type: Manual Max wheelchair distance: 150 Assist Level: Touching or steadying assistance (Pt > 75%) Assist Level: Touching or steadying assistance (Pt > 75%) Assist Level: Touching or steadying assistance (Pt > 75%) Turns around,maneuvers to table,bed, and toilet,negotiates 3%  grade,maneuvers on rugs and over doorsills: No Function - Locomotion: Ambulation Assistive device: Walker-rolling Max distance: 150 Assist level: Touching or steadying assistance (Pt > 75%) Assist level: Touching or steadying assistance (Pt > 75%) Walk 50 feet with 2 turns activity did not occur: Safety/medical concerns Assist level: Touching or steadying assistance (Pt > 75%) Walk 150 feet activity did not occur: Safety/medical concerns Assist level: Touching or steadying assistance (Pt > 75%) Walk 10 feet on uneven surfaces activity did not occur: Safety/medical concerns Assist level: Maximal assist (Pt 25 - 49%)  Function - Comprehension Comprehension: Auditory Comprehension assist level: Follows basic conversation/direction with extra time/assistive device  Function - Expression Expression: Verbal Expression assist level: Expresses basic 75 - 89% of the  time/requires cueing 10 - 24% of the time. Needs helper to occlude trach/needs to repeat words.  Function - Social Interaction Social Interaction assist level: Interacts appropriately with others - No medications needed.  Function - Problem Solving Problem solving assist level: Solves basic 50 - 74% of the time/requires cueing 25 - 49% of the time  Function - Memory Memory assist level: Recognizes or recalls 50 - 74% of the time/requires cueing 25 - 49% of the time Patient normally able to recall (first 3 days only): Current season, That he or she is in a hospital  Medical Problem List and Plan:   1.  Right hemiparesis dysarthria/dysphagia secondary to left pontine infarct on 10/14/15 and left posterior lateral temporal infarct 8/27  Cont CIR, PT, OT, SLP- minG with PT yesterday, improved tolerance  New stroke on 8/27, MRI reviewed with new stroke, spoke to Neurology, no changes in medication, possible Loop.  2.  DVT Prophylaxis/Anticoagulation: Subcutaneous heparin changed to lovenox. Monitor platelet count and any signs of bleeding 3. Pain Management: Tylenol as needed 4. Hypertension.   Lopressor discontinued due to bradycardia.  Increased hydralazine 50 mg every 6 hours on 8/26, increased to 100 on 8/27  Norvasc 5 started 8/26  Will need to monitor closely, once off steroids  Will not make further changes at present and allow for permissive HTN, improving, goal SBP 130-150 Vitals:   11/01/15 0459 11/01/15 1028  BP: (!) 177/77 (!) 170/61  Pulse: 92   Resp: 18   Temp: 98.2 F (36.8 C)    5. Sinus arrest due to vagal spell. Beta blocker has been discontinued for now. No additional episodes. Follow-up per cardiology services. No plan at this time for pacemaker. 6. Neuropsych: This patient is not fully capable of making decisions on his own behalf./  Started on Zoloft 8/27, changed to Prozaac on 8/28, d/ced 8/31 due to ?nausea 7. Skin/Wound Care: Routine skin check 8.  Fluids/Electrolytes/Nutrition: Routine I&O's  9. Chronic renal insufficiency. Baseline creatinine 1.5.   Cr. At baseline  Will cont to monitor   10. Diet-controlled diabetes mellitus. Hemoglobin A1c 7.3. Check blood sugars before meals and at bedtime. Diabetic teaching.   Now off prednisone with persistent elevation  -diet changed to CM medium  -add low dose amaryl CBG (last 3)   Recent Labs  10/31/15 1625 10/31/15 2128 11/01/15 0638  GLUCAP 196* 173* 171*  11. CAD/SVT. No chest pain or shortness of breath.  12. Gout.attack resolved Placed on colchicine 1 dose as well as prednisone  will d/c and monitor for reoccurance 13. Hyperlipidemia. Pravachol 14. ?Hematuria: Workup unremarkable  Per pt.  Per nursing, no blood, however, pt states it was when he began urinating  UA relatively unremarkable  Hb stable 15. Subjective  bleeding: No objective findings  Pt noted to have increased bleeding after heparin injection with self-reported hematuria   UA negative  Hemoccult negative 16. Urinary retention  Confirmed with PVRs  started bethanechol 10 TID,  -will hold flomax due to family concerns.   -re-sending Urine for UA and UCX 17. Nausea- states he's had since he's been in hospital  -somewhat better today  -had BM yesterday  -continue miralax and colace  Bethanechol started 18. Fatigue: likely multifactorial related to age/stroke  -no neuro changes on exam  - needs better control of DM    -recheck ua and ucx  -stopped flomax as above  -limit neurosedating meds   LOS (Days) 11 A FACE TO FACE EVALUATION WAS PERFORMED  Brissia Delisa T 11/01/2015, 11:32 AM

## 2015-11-02 ENCOUNTER — Inpatient Hospital Stay (HOSPITAL_COMMUNITY): Payer: Medicare Other

## 2015-11-02 ENCOUNTER — Inpatient Hospital Stay (HOSPITAL_COMMUNITY): Payer: Medicare Other | Admitting: Occupational Therapy

## 2015-11-02 ENCOUNTER — Inpatient Hospital Stay (HOSPITAL_COMMUNITY): Payer: Medicare Other | Admitting: Speech Pathology

## 2015-11-02 LAB — GLUCOSE, CAPILLARY
Glucose-Capillary: 118 mg/dL — ABNORMAL HIGH (ref 65–99)
Glucose-Capillary: 196 mg/dL — ABNORMAL HIGH (ref 65–99)
Glucose-Capillary: 225 mg/dL — ABNORMAL HIGH (ref 65–99)

## 2015-11-02 MED ORDER — TAMSULOSIN HCL 0.4 MG PO CAPS
0.4000 mg | ORAL_CAPSULE | Freq: Every day | ORAL | Status: DC
  Administered 2015-11-02 – 2015-11-04 (×3): 0.4 mg via ORAL
  Filled 2015-11-02 (×3): qty 1

## 2015-11-02 NOTE — Progress Notes (Signed)
Physical Therapy Session Note  Patient Details  Name: [name] MRN: [id number] Date of Birth: [birth year]  Today's Date: 11/02/2015 PT Individual Time: [id number] (Make up minutes) PT Individual Time Calculation (min): 16 min    Short Term Goals: Week 2:  PT Short Term Goal 1 (Week 2): pt will transfer with min assist consistently PT Short Term Goal 2 (Week 2): pt will propel w/c x 150' with supervision PT Short Term Goal 3 (Week 2): pt will perform gait x 150' with LRAD and min assist PT Short Term Goal 4 (Week 2): pt will ascend and descend 12 steps with min assist  Skilled Therapeutic Interventions/Progress Updates:  Pt received in bed; pt reporting dizziness, nausea and feeling cold.  Pt denies emesis.  Pt educated on purpose of vestibular evaluation and pt agreeable to initiate assessment bed level.  Obtained history of dizziness from pt; pt reports constant feelings of "sea sickness" and intermittent episodes of spinning vertigo.  Pt does not note any aggravating or alleviating factors.  He reports nausea but no vomiting.  Pt does report diplopia.  Oculomotor exam initiated but due to head position in bed and decreased alertness and attention pt only able to perform smooth pursuits and convergence testing.  Smooth pursuits appear [facility] but convergence impaired.  Will attempt more in depth assessment at a later date when pt able to tolerate.  Pt left in bed with primary PT to follow up with another session.   Therapy Documentation Precautions:  Precautions Precautions: Fall Precaution Comments: right hemiparesis Restrictions Weight Bearing Restrictions: No Vital Signs: Therapy Vitals Temp: 97.7 F (36.5 C) Temp Source: Oral Pulse Rate: (!) 101 Resp: 17 BP: (!) 141/69 Patient Position (if appropriate): Sitting Pain: Pain Assessment Pain Assessment: No/denies pain  See Function Navigator for Current Functional Status.   Therapy/Group: Individual Therapy  Raylene Everts  [place] 11/02/2015, 4:11 PM

## 2015-11-02 NOTE — Progress Notes (Signed)
Physical Therapy Session Note  Patient Details  Name: [name] MRN: [id number] Date of Birth: [birth year]  Today's Date: 11/02/2015 PT Individual Time: [id number]; 1400-1500 PT Individual Time Calculation (min): 30 min ; 60 min  Short Term Goals: Week 2:  PT Short Term Goal 1 (Week 2): pt will transfer with min assist consistently PT Short Term Goal 2 (Week 2): pt will propel w/c x 150' with supervision PT Short Term Goal 3 (Week 2): pt will perform gait x 150' with LRAD and min assist PT Short Term Goal 4 (Week 2): pt will ascend and descend 12 steps with min assist  Skilled Therapeutic Interventions/Progress Updates:  tx 1: neuromuscular re-education in supine: bil bridging, lower trunk rotation, dissociation of upper and lower trunk via lateral scooting of pelvis and shoulders. Supine> sit very difficult due to inability to get R shoulder under trunk and push up.  Gait on level tile with Rw.  Pt left resting in w/c with quick release belt applied and all needs within reach.  tx 2: gait in room to toilet and sink, with RW . Toilet transfer with RW.  neuromuscular re-education for RUE via w/c propulsion x 150' on level tile, min assist and max cues for longer strokes R hand and attention to obstacles on R.  Gait on level tile x 55' x 2 and up (4) 6" high steps with supervision to ascend, step through;  min assist to descend, step through with poor eccentric control noted.  Pt left resting in w/c with quick release belt applied and all needs within reach.     Therapy Documentation Precautions:  Precautions Precautions: Fall Precaution Comments: right hemiparesis Restrictions Weight Bearing Restrictions: No   Pain: no c/o; pt has nausea     Other Treatments: Treatments Neuromuscular Facilitation: Right;Lower Extremity;Upper Extremity;Forced use;Activity to increase timing and sequencing;Activity to increase motor control;Activity to increase sustained activation;Activity to increase  lateral weight shifting   See Function Navigator for Current Functional Status.   Therapy/Group: Individual Therapy  Emine Lopata 11/02/2015, 10:04 AM

## 2015-11-02 NOTE — Progress Notes (Signed)
Subjective/Complaints: No issues overnite, voided yesterday pm but needed cath in am Some dorsum hand pain on left, not in joint   ROS-  +Right hand tingling, heaviness,Denies CP,SOB,    Objective: Vital Signs: Blood pressure (!) 169/72, pulse 95, temperature 98.3 F (36.8 C), temperature source Oral, resp. rate 18, height 5' 8"  (1.727 m), weight 77.1 kg (169 lb 15.6 oz), SpO2 91 %. No results found. Results for orders placed or performed during the hospital encounter of 10/21/15 (from the past 72 hour(s))  Glucose, capillary     Status: Abnormal   Collection Time: 10/30/15 11:36 AM  Result Value Ref Range   Glucose-Capillary 236 (H) 65 - 99 mg/dL  Glucose, capillary     Status: Abnormal   Collection Time: 10/30/15  4:48 PM  Result Value Ref Range   Glucose-Capillary 172 (H) 65 - 99 mg/dL  Glucose, capillary     Status: Abnormal   Collection Time: 10/30/15  9:17 PM  Result Value Ref Range   Glucose-Capillary 222 (H) 65 - 99 mg/dL  Glucose, capillary     Status: Abnormal   Collection Time: 10/31/15  6:49 AM  Result Value Ref Range   Glucose-Capillary 159 (H) 65 - 99 mg/dL  Basic metabolic panel     Status: Abnormal   Collection Time: 10/31/15 11:18 AM  Result Value Ref Range   Sodium 132 (L) 135 - 145 mmol/L   Potassium 4.0 3.5 - 5.1 mmol/L   Chloride 100 (L) 101 - 111 mmol/L   CO2 20 (L) 22 - 32 mmol/L   Glucose, Bld 291 (H) 65 - 99 mg/dL   BUN 28 (H) 6 - 20 mg/dL   Creatinine, Ser 1.66 (H) 0.61 - 1.24 mg/dL   Calcium 9.4 8.9 - 10.3 mg/dL   GFR calc non Af Amer 36 (L) >60 mL/min   GFR calc Af Amer 41 (L) >60 mL/min    Comment: (NOTE) The eGFR has been calculated using the CKD EPI equation. This calculation has not been validated in all clinical situations. eGFR's persistently <60 mL/min signify possible Chronic Kidney Disease.    Anion gap 12 5 - 15  CBC     Status: Abnormal   Collection Time: 10/31/15 11:18 AM  Result Value Ref Range   WBC 10.6 (H) 4.0 - 10.5  K/uL   RBC 5.41 4.22 - 5.81 MIL/uL   Hemoglobin 16.5 13.0 - 17.0 g/dL   HCT 48.7 39.0 - 52.0 %   MCV 90.0 78.0 - 100.0 fL   MCH 30.5 26.0 - 34.0 pg   MCHC 33.9 30.0 - 36.0 g/dL   RDW 14.0 11.5 - 15.5 %   Platelets 194 150 - 400 K/uL  Glucose, capillary     Status: Abnormal   Collection Time: 10/31/15  4:25 PM  Result Value Ref Range   Glucose-Capillary 196 (H) 65 - 99 mg/dL   Comment 1 Notify RN   Glucose, capillary     Status: Abnormal   Collection Time: 10/31/15  9:28 PM  Result Value Ref Range   Glucose-Capillary 173 (H) 65 - 99 mg/dL  Glucose, capillary     Status: Abnormal   Collection Time: 11/01/15  6:38 AM  Result Value Ref Range   Glucose-Capillary 171 (H) 65 - 99 mg/dL  Glucose, capillary     Status: Abnormal   Collection Time: 11/01/15 12:01 PM  Result Value Ref Range   Glucose-Capillary 203 (H) 65 - 99 mg/dL  Glucose, capillary  Status: Abnormal   Collection Time: 11/01/15  4:55 PM  Result Value Ref Range   Glucose-Capillary 199 (H) 65 - 99 mg/dL  Urinalysis, Routine w reflex microscopic (not at [facility])     Status: Abnormal   Collection Time: 11/01/15  6:59 PM  Result Value Ref Range   Color, Urine YELLOW YELLOW   APPearance CLEAR CLEAR   Specific Gravity, Urine 1.025 1.005 - 1.030   pH 5.5 5.0 - 8.0   Glucose, UA 500 (A) NEGATIVE mg/dL   Hgb urine dipstick NEGATIVE NEGATIVE   Bilirubin Urine NEGATIVE NEGATIVE   Ketones, ur NEGATIVE NEGATIVE mg/dL   Protein, ur NEGATIVE NEGATIVE mg/dL   Nitrite NEGATIVE NEGATIVE   Leukocytes, UA TRACE (A) NEGATIVE  Urine microscopic-add on     Status: Abnormal   Collection Time: 11/01/15  6:59 PM  Result Value Ref Range   Squamous Epithelial / LPF 0-5 (A) NONE SEEN   WBC, UA 6-30 0 - 5 WBC/hpf   RBC / HPF 0-5 0 - 5 RBC/hpf   Bacteria, UA FEW (A) NONE SEEN  Glucose, capillary     Status: Abnormal   Collection Time: 11/01/15  8:53 PM  Result Value Ref Range   Glucose-Capillary 158 (H) 65 - 99 mg/dL  Glucose, capillary      Status: Abnormal   Collection Time: 11/02/15  6:05 AM  Result Value Ref Range   Glucose-Capillary 118 (H) 65 - 99 mg/dL     HEENT: normocephalic, atraumatic. More alert  Cardio: RRR and no murmur Resp: CTA B/L and unlabored GI: BS positive and NT, ND Skin:   Intact. Warm and dry. Neuro: drowsy but arousable  Motor 4-/5 R delt, bi, tri, grip. Apraxia. Decreased [place]. (?effort) 4/5 R HF, KE ,4+/5 ADF/PF (?effort) LUE/LLE: 4+/5 proximal to distal Dysarthria I SEE NO CHANGES IN EXAM Musc/Skel:  No edema. No erythema. Gen: NAD. Vitals signs reviewed.    Assessment/Plan: 1. Functional deficits secondary to Left pontine infarct and new left posterior lateral temporal infarct which require 3+ hours per day of interdisciplinary therapy in a comprehensive inpatient rehab setting. Physiatrist is providing close team supervision and 24 hour management of active medical problems listed below. Physiatrist and rehab team continue to assess barriers to discharge/monitor patient progress toward functional and medical goals. FIM: Function - Bathing Position: Wheelchair/chair at sink Body parts bathed by patient: Right arm, Left arm, Chest, Abdomen, Front perineal area, Buttocks Body parts bathed by helper: Back Bathing not applicable: Right upper leg, Left upper leg, Right lower leg, Left lower leg (Did not attempt this session) Assist Level: Touching or steadying assistance(Pt > 75%)  Function- Upper Body Dressing/Undressing What is the patient wearing?: Pull over shirt/dress Pull over shirt/dress - Perfomed by patient: Thread/unthread right sleeve, Thread/unthread left sleeve, Put head through opening, Pull shirt over trunk Pull over shirt/dress - Perfomed by helper: Pull shirt over trunk Assist Level: Supervision or verbal cues Function - Lower Body Dressing/Undressing What is the patient wearing?: Pants, Non-skid slipper socks Position: Wheelchair/chair at sink Underwear - Performed by  patient: Thread/unthread left underwear leg, Pull underwear up/down Underwear - Performed by helper: Pull underwear up/down, Thread/unthread left underwear leg, Thread/unthread right underwear leg Pants- Performed by patient: Thread/unthread right pants leg, Pull pants up/down Pants- Performed by helper: Thread/unthread left pants leg Non-skid slipper socks- Performed by helper: Don/doff right sock, Don/doff left sock TED Hose - Performed by helper: Don/doff right TED hose, Don/doff left TED hose Assist for footwear: Dependant Assist  for lower body dressing: Touching or steadying assistance (Pt > 75%)  Function - Toileting Toileting activity did not occur: Refused Toileting steps completed by patient: Performs perineal hygiene Toileting steps completed by helper: Adjust clothing prior to toileting, Adjust clothing after toileting Toileting Assistive Devices: Grab bar or rail Assist level: Touching or steadying assistance (Pt.75%)  Function - Air cabin crew transfer assistive device: Grab bar Assist level to toilet: Touching or steadying assistance (Pt > 75%) Assist level from toilet: Touching or steadying assistance (Pt > 75%)  Function - Chair/bed transfer Chair/bed transfer method: Stand pivot Chair/bed transfer assist level: Touching or steadying assistance (Pt > 75%) Chair/bed transfer assistive device: Armrests, Walker Chair/bed transfer details: Manual facilitation for weight shifting, Verbal cues for sequencing, Verbal cues for technique  Function - Locomotion: Wheelchair Will patient use wheelchair at discharge?: Yes Type: Manual Max wheelchair distance: 150 Assist Level: Touching or steadying assistance (Pt > 75%) Assist Level: Touching or steadying assistance (Pt > 75%) Assist Level: Touching or steadying assistance (Pt > 75%) Turns around,maneuvers to table,bed, and toilet,negotiates 3% grade,maneuvers on rugs and over doorsills: No Function - Locomotion:  Ambulation Assistive device: Walker-rolling Max distance: 150 Assist level: Touching or steadying assistance (Pt > 75%) Assist level: Touching or steadying assistance (Pt > 75%) Walk 50 feet with 2 turns activity did not occur: Safety/medical concerns Assist level: Touching or steadying assistance (Pt > 75%) Walk 150 feet activity did not occur: Safety/medical concerns Assist level: Touching or steadying assistance (Pt > 75%) Walk 10 feet on uneven surfaces activity did not occur: Safety/medical concerns Assist level: Maximal assist (Pt 25 - 49%)  Function - Comprehension Comprehension: Auditory Comprehension assist level: Follows basic conversation/direction with no assist  Function - Expression Expression: Verbal Expression assist level: Expresses basic needs/ideas: With no assist  Function - Social Interaction Social Interaction assist level: Interacts appropriately 90% of the time - Needs monitoring or encouragement for participation or interaction.  Function - Problem Solving Problem solving assist level: Solves complex 90% of the time/cues < 10% of the time  Function - Memory Memory assist level: Recognizes or recalls 90% of the time/requires cueing < 10% of the time Patient normally able to recall (first 3 days only): Current season  Medical Problem List and Plan:   1.  Right hemiparesis dysarthria/dysphagia secondary to left pontine infarct on 10/14/15 and left posterior lateral temporal infarct 8/27  Cont CIR, PT, OT, SLP- discussed long term nature of recovery  New stroke on 8/27, MRI reviewed with new stroke, spoke to Neurology, no changes in medication, possible Loop.  2.  DVT Prophylaxis/Anticoagulation: Subcutaneous heparin changed to lovenox. Monitor platelet count and any signs of bleeding 3. Pain Management: Tylenol as needed 4. Hypertension.   Lopressor discontinued due to bradycardia.  Increased hydralazine 50 mg every 6 hours on 8/26, increased to 100 on  8/27  Norvasc 5 started 8/26  Will need to monitor closely, once off steroids  Will not make further changes at present and allow for permissive HTN, improving, goal SBP 130-150, adding flomax this may lower BP Vitals:   11/02/15 0035 11/02/15 0439  BP: (!) 165/71 (!) 169/72  Pulse:  95  Resp:    Temp:  98.3 F (36.8 C)   5. Sinus arrest due to vagal spell. Beta blocker has been discontinued for now. No additional episodes. Follow-up per cardiology services. No plan at this time for pacemaker. 6. Neuropsych: This patient is not fully capable of making decisions on his  own behalf./  Started on Zoloft 8/27, changed to Prozaac on 8/28, d/ced 8/31 due to ?nausea 7. Skin/Wound Care: Routine skin check 8. Fluids/Electrolytes/Nutrition: Routine I&O's  9. Chronic renal insufficiency. Baseline creatinine 1.5.   Cr. At baseline  Will cont to monitor   10. Diet-controlled diabetes mellitus. Hemoglobin A1c 7.3. Check blood sugars before meals and at bedtime. Diabetic teaching.   Now off prednisone with persistent elevation  -diet changed to CM medium  -on amaryl, am CBG at goal CBG (last 3)   Recent Labs  11/01/15 1655 11/01/15 2053 11/02/15 0605  GLUCAP 199* 158* 118*  11. CAD/SVT. No chest pain or shortness of breath.  12. Gout.attack resolved  monitor for reoccurance 13. Hyperlipidemia. Pravachol 14. ?Hematuria: resolved, question cath related 16. Urinary retention  Confirmed with PVRs  started bethanechol 10 TID,  -discussed flomax with daughter and pt, she requests it gets re tr  -re-sending Urine for UA and UCX 17. Nausea- states he's had since he's been in hospital  -somewhat better today  -had BM yesterday  -continue miralax and colace  Bethanechol started 18. Fatigue: likely multifactorial related to age/stroke  -no neuro changes on exam  - UA neg except +WBCs, few bact, await Cx   LOS (Days) 12 A FACE TO FACE EVALUATION WAS PERFORMED  KIRSTEINS,ANDREW E 11/02/2015,  7:59 AM

## 2015-11-02 NOTE — Progress Notes (Signed)
Occupational Therapy Session Note  Patient Details  Name: [name] MRN: [id number] Date of Birth: 02-19-1928  Today's Date: 11/02/2015 OT Individual Time: [id number] OT Individual Time Calculation (min): 56 min     Short Term Goals: Week 2:  OT Short Term Goal 1 (Week 2): Pt will transfer to the elevated toilet with min assist using LRAD. OT Short Term Goal 2 (Week 2): Pt will complete all bathing with no more than min assist.   OT Short Term Goal 3 (Week 2): Pt will complete LB dressing with min assist using AE PRN. OT Short Term Goal 4 (Week 2): Pt will perform shower transfers with min assist to the walk-in shower for 3 consecutive sessions. OT Short Term Goal 5 (Week 2): Pt will use the RUE for all dressing tasks at a diminshed level with supervision.    Skilled Therapeutic Interventions/Progress Updates:    Pt ambulated to the bathroom per request with min assist.  He was able to manage clothing and complete toilet hygiene after successful BM.  Ambulated to the tub bench with mod hand held assist from toilet.  He was able to complete most of bathing with min assist sit to stand.  Decreased ability to reach either foot for washing.  Needed mod instructional cueing for selective attention secondary to continuing to perseverate on washing his UB.  Still with increased nausea and dizziness with movement.  Pt placed back in bed at end of session secondary to nausea.  Call button in reach and bed alarm in place.   Therapy Documentation Precautions:  Precautions Precautions: Fall Precaution Comments: right hemiparesis Restrictions Weight Bearing Restrictions: No  Pain: Pain Assessment Pain Assessment: No/denies pain ADL: See Function Navigator for Current Functional Status.   Therapy/Group: Individual Therapy  Julanne Schlueter OTR/L 11/02/2015, 12:29 PM

## 2015-11-02 NOTE — Progress Notes (Signed)
Speech Language Pathology Daily Session Note  Patient Details  Name: [name] MRN: [id number] Date of Birth: 1927-04-16  Today's Date: 11/02/2015 SLP Individual Time: 1500-1600 SLP Individual Time Calculation (min): 60 min   Short Term Goals: Week 2: SLP Short Term Goal 1 (Week 2): Pt will demonstrate intellectual awareness within functional tasks with Min A cues.  SLP Short Term Goal 2 (Week 2): Pt will utilize speech intelligibility strategies at the simple conversation level with supervision cues. to achieve greater than 90% intelligibility over 3 targeted sessions SLP Short Term Goal 3 (Week 2): Pt will demonstrate functional problem solving for mildly complex tasks with Min A verbal cues.  SLP Short Term Goal 4 (Week 2): Pt will demonstrate sustained attention to task for 5 minutes with Mod assist verbal cues for redirection  Skilled Therapeutic Interventions:   Skilled treatment session focused on addressing cognition goals. Patient reports lots of difficulty with focus/attention and feeling foggy headed.  As a result, SLP facilitated session by providing a structured naming task to address sustained attention to task.  Patient named items in 13/25 opportunities without assist; Min verbal cues to sustain attention to task increased accuracy to 20/25.  Patient assisted SLP in compiling basic grocery store list with Min assist cues to problem solve task.  Patient with nausea and gagging x2 with oral expectoration of saliva.  RN made aware.  Care plan modified to reflect attention goals.      Function:   Cognition Comprehension Comprehension assist level: Follows basic conversation/direction with no assist  Expression   Expression assist level: Expresses basic needs/ideas: With no assist  Social Interaction Social Interaction assist level: Interacts appropriately 90% of the time - Needs monitoring or encouragement for participation or interaction.  Problem Solving Problem solving  assist level: Solves basic 50 - 74% of the time/requires cueing 25 - 49% of the time  Memory Memory assist level: Recognizes or recalls 50 - 74% of the time/requires cueing 25 - 49% of the time    Pain Pain Assessment Pain Assessment: No/denies pain  Therapy/Group: Individual Therapy  Carmelia Roller., CCC-SLP [id number]  [place] 11/02/2015, 3:53 PM

## 2015-11-03 ENCOUNTER — Inpatient Hospital Stay (HOSPITAL_COMMUNITY): Payer: Medicare Other | Admitting: Physical Therapy

## 2015-11-03 ENCOUNTER — Inpatient Hospital Stay (HOSPITAL_COMMUNITY): Payer: Medicare Other | Admitting: Occupational Therapy

## 2015-11-03 ENCOUNTER — Inpatient Hospital Stay (HOSPITAL_COMMUNITY): Payer: Medicare Other | Admitting: Speech Pathology

## 2015-11-03 ENCOUNTER — Encounter (HOSPITAL_COMMUNITY): Payer: Medicare Other

## 2015-11-03 LAB — GLUCOSE, CAPILLARY
GLUCOSE-CAPILLARY: 319 mg/dL — AB (ref 65–99)
GLUCOSE-CAPILLARY: 195 mg/dL — AB (ref 65–99)
Glucose-Capillary: 285 mg/dL — ABNORMAL HIGH (ref 65–99)
Glucose-Capillary: 124 mg/dL — ABNORMAL HIGH (ref 65–99)
Glucose-Capillary: 143 mg/dL — ABNORMAL HIGH (ref 65–99)

## 2015-11-03 MED ORDER — POLYVINYL ALCOHOL 1.4 % OP SOLN
1.0000 [drp] | OPHTHALMIC | Status: DC | PRN
  Administered 2015-11-03: 1 [drp] via OPHTHALMIC
  Filled 2015-11-03: qty 15

## 2015-11-03 MED ORDER — GLIMEPIRIDE 2 MG PO TABS
2.0000 mg | ORAL_TABLET | Freq: Every day | ORAL | Status: DC
  Administered 2015-11-03 – 2015-11-08 (×6): 2 mg via ORAL
  Filled 2015-11-03 (×6): qty 1

## 2015-11-03 NOTE — Progress Notes (Signed)
Subjective/Complaints: No issues overnite, voided yesterday pm but needed cath in am Some dorsum hand pain on left, not in joint   ROS-  +Right hand tingling, heaviness,Denies CP,SOB,    Objective: Vital Signs: Blood pressure (!) 160/65, pulse 88, temperature 98.9 F (37.2 C), temperature source Oral, resp. rate 17, height 5' 8"  (1.727 m), weight 74.9 kg (165 lb 2 oz), SpO2 95 %. No results found. Results for orders placed or performed during the hospital encounter of 10/21/15 (from the past 72 hour(s))  Basic metabolic panel     Status: Abnormal   Collection Time: 10/31/15 11:18 AM  Result Value Ref Range   Sodium 132 (L) 135 - 145 mmol/L   Potassium 4.0 3.5 - 5.1 mmol/L   Chloride 100 (L) 101 - 111 mmol/L   CO2 20 (L) 22 - 32 mmol/L   Glucose, Bld 291 (H) 65 - 99 mg/dL   BUN 28 (H) 6 - 20 mg/dL   Creatinine, Ser 1.66 (H) 0.61 - 1.24 mg/dL   Calcium 9.4 8.9 - 10.3 mg/dL   GFR calc non Af Amer 36 (L) >60 mL/min   GFR calc Af Amer 41 (L) >60 mL/min    Comment: (NOTE) The eGFR has been calculated using the CKD EPI equation. This calculation has not been validated in all clinical situations. eGFR's persistently <60 mL/min signify possible Chronic Kidney Disease.    Anion gap 12 5 - 15  CBC     Status: Abnormal   Collection Time: 10/31/15 11:18 AM  Result Value Ref Range   WBC 10.6 (H) 4.0 - 10.5 K/uL   RBC 5.41 4.22 - 5.81 MIL/uL   Hemoglobin 16.5 13.0 - 17.0 g/dL   HCT 48.7 39.0 - 52.0 %   MCV 90.0 78.0 - 100.0 fL   MCH 30.5 26.0 - 34.0 pg   MCHC 33.9 30.0 - 36.0 g/dL   RDW 14.0 11.5 - 15.5 %   Platelets 194 150 - 400 K/uL  Glucose, capillary     Status: Abnormal   Collection Time: 10/31/15  4:25 PM  Result Value Ref Range   Glucose-Capillary 196 (H) 65 - 99 mg/dL   Comment 1 Notify RN   Glucose, capillary     Status: Abnormal   Collection Time: 10/31/15  9:28 PM  Result Value Ref Range   Glucose-Capillary 173 (H) 65 - 99 mg/dL  Glucose, capillary     Status:  Abnormal   Collection Time: 11/01/15  6:38 AM  Result Value Ref Range   Glucose-Capillary 171 (H) 65 - 99 mg/dL  Glucose, capillary     Status: Abnormal   Collection Time: 11/01/15 12:01 PM  Result Value Ref Range   Glucose-Capillary 203 (H) 65 - 99 mg/dL  Glucose, capillary     Status: Abnormal   Collection Time: 11/01/15  4:55 PM  Result Value Ref Range   Glucose-Capillary 199 (H) 65 - 99 mg/dL  Urinalysis, Routine w reflex microscopic (not at [facility])     Status: Abnormal   Collection Time: 11/01/15  6:59 PM  Result Value Ref Range   Color, Urine YELLOW YELLOW   APPearance CLEAR CLEAR   Specific Gravity, Urine 1.025 1.005 - 1.030   pH 5.5 5.0 - 8.0   Glucose, UA 500 (A) NEGATIVE mg/dL   Hgb urine dipstick NEGATIVE NEGATIVE   Bilirubin Urine NEGATIVE NEGATIVE   Ketones, ur NEGATIVE NEGATIVE mg/dL   Protein, ur NEGATIVE NEGATIVE mg/dL   Nitrite NEGATIVE NEGATIVE   Leukocytes,  UA TRACE (A) NEGATIVE  Culture, Urine     Status: Abnormal (Preliminary result)   Collection Time: 11/01/15  6:59 PM  Result Value Ref Range   Specimen Description URINE, RANDOM    Special Requests NONE    Culture >=100,000 COLONIES/mL ENTEROCOCCUS FAECALIS (A)    Report Status PENDING   Urine microscopic-add on     Status: Abnormal   Collection Time: 11/01/15  6:59 PM  Result Value Ref Range   Squamous Epithelial / LPF 0-5 (A) NONE SEEN   WBC, UA 6-30 0 - 5 WBC/hpf   RBC / HPF 0-5 0 - 5 RBC/hpf   Bacteria, UA FEW (A) NONE SEEN  Glucose, capillary     Status: Abnormal   Collection Time: 11/01/15  8:53 PM  Result Value Ref Range   Glucose-Capillary 158 (H) 65 - 99 mg/dL  Glucose, capillary     Status: Abnormal   Collection Time: 11/02/15  6:05 AM  Result Value Ref Range   Glucose-Capillary 118 (H) 65 - 99 mg/dL  Glucose, capillary     Status: Abnormal   Collection Time: 11/02/15 11:44 AM  Result Value Ref Range   Glucose-Capillary 196 (H) 65 - 99 mg/dL  Glucose, capillary     Status: Abnormal    Collection Time: 11/02/15  4:26 PM  Result Value Ref Range   Glucose-Capillary 225 (H) 65 - 99 mg/dL  Glucose, capillary     Status: Abnormal   Collection Time: 11/03/15  7:19 AM  Result Value Ref Range   Glucose-Capillary 124 (H) 65 - 99 mg/dL   Comment 1 Notify RN      HEENT: normocephalic, atraumatic. More alert  Cardio: RRR and no murmur Resp: CTA B/L and unlabored GI: BS positive and NT, ND Skin:   Intact. Warm and dry. Neuro: drowsy but arousable  Motor 4-/5 R delt, bi, tri, grip. Apraxia. Decreased Nadine. (?effort) 4/5 R HF, KE ,4+/5 ADF/PF (?effort) LUE/LLE: 4+/5 proximal to distal Dysarthria I SEE NO CHANGES IN EXAM Musc/Skel:  No edema. No erythema. Gen: NAD. Vitals signs reviewed.    Assessment/Plan: 1. Functional deficits secondary to Left pontine infarct and new left posterior lateral temporal infarct which require 3+ hours per day of interdisciplinary therapy in a comprehensive inpatient rehab setting. Physiatrist is providing close team supervision and 24 hour management of active medical problems listed below. Physiatrist and rehab team continue to assess barriers to discharge/monitor patient progress toward functional and medical goals. FIM: Function - Bathing Position: Wheelchair/chair at sink Body parts bathed by patient: Right arm, Left arm, Chest, Abdomen, Front perineal area, Buttocks, Right upper leg, Left upper leg Body parts bathed by helper: Right lower leg, Left lower leg, Back Bathing not applicable: Right upper leg, Left upper leg, Right lower leg, Left lower leg (Did not attempt this session) Assist Level: Touching or steadying assistance(Pt > 75%)  Function- Upper Body Dressing/Undressing What is the patient wearing?: Pull over shirt/dress Pull over shirt/dress - Perfomed by patient: Thread/unthread right sleeve, Thread/unthread left sleeve, Put head through opening, Pull shirt over trunk Pull over shirt/dress - Perfomed by helper: Pull shirt over  trunk Assist Level: Supervision or verbal cues Function - Lower Body Dressing/Undressing What is the patient wearing?: Pants, Non-skid slipper socks Position: Wheelchair/chair at sink Underwear - Performed by patient: Thread/unthread left underwear leg, Pull underwear up/down Underwear - Performed by helper: Pull underwear up/down, Thread/unthread left underwear leg, Thread/unthread right underwear leg Pants- Performed by patient: Thread/unthread right pants leg, Pull  pants up/down Pants- Performed by helper: Fasten/unfasten pants, Pull pants up/down, Thread/unthread right pants leg, Thread/unthread left pants leg Non-skid slipper socks- Performed by helper: Don/doff right sock, Don/doff left sock TED Hose - Performed by helper: Don/doff right TED hose, Don/doff left TED hose Assist for footwear: Dependant Assist for lower body dressing: Touching or steadying assistance (Pt > 75%)  Function - Toileting Toileting activity did not occur: Refused Toileting steps completed by patient: Performs perineal hygiene, Adjust clothing prior to toileting, Adjust clothing after toileting Toileting steps completed by helper: Adjust clothing prior to toileting, Performs perineal hygiene, Adjust clothing after toileting Toileting Assistive Devices: Grab bar or rail Assist level: Touching or steadying assistance (Pt.75%)  Function - Air cabin crew transfer assistive device: Grab bar, Walker Assist level to toilet: Touching or steadying assistance (Pt > 75%) Assist level from toilet: Touching or steadying assistance (Pt > 75%)  Function - Chair/bed transfer Chair/bed transfer method: Stand pivot Chair/bed transfer assist level: Touching or steadying assistance (Pt > 75%) Chair/bed transfer assistive device: Armrests, Walker Chair/bed transfer details: Manual facilitation for weight shifting, Verbal cues for sequencing, Verbal cues for technique  Function - Locomotion: Wheelchair Will patient  use wheelchair at discharge?: Yes Type: Manual Max wheelchair distance: 150 Assist Level: Touching or steadying assistance (Pt > 75%) Assist Level: Touching or steadying assistance (Pt > 75%) Assist Level: Touching or steadying assistance (Pt > 75%) Turns around,maneuvers to table,bed, and toilet,negotiates 3% grade,maneuvers on rugs and over doorsills: No Function - Locomotion: Ambulation Assistive device: Walker-rolling Max distance: 150 Assist level: Supervision or verbal cues Assist level: Supervision or verbal cues Walk 50 feet with 2 turns activity did not occur: Safety/medical concerns Assist level: Supervision or verbal cues Walk 150 feet activity did not occur: Safety/medical concerns Assist level: Supervision or verbal cues Walk 10 feet on uneven surfaces activity did not occur: Safety/medical concerns Assist level: Maximal assist (Pt 25 - 49%)  Function - Comprehension Comprehension: Auditory Comprehension assist level: Follows basic conversation/direction with no assist  Function - Expression Expression: Verbal Expression assist level: Expresses basic needs/ideas: With no assist  Function - Social Interaction Social Interaction assist level: Interacts appropriately 90% of the time - Needs monitoring or encouragement for participation or interaction.  Function - Problem Solving Problem solving assist level: Solves basic problems with no assist  Function - Memory Memory assist level: Recognizes or recalls 90% of the time/requires cueing < 10% of the time Patient normally able to recall (first 3 days only): Current season  Medical Problem List and Plan:   1.  Right hemiparesis dysarthria/dysphagia secondary to left pontine infarct on 10/14/15 and left posterior lateral temporal infarct 8/27  Cont CIR, PT, OT, SLP- discussed long term nature of recovery  New stroke on 8/27, MRI reviewed with new stroke, spoke to Neurology, no changes in medication, possible Loop.  2.   DVT Prophylaxis/Anticoagulation: Subcutaneous heparin changed to lovenox. Monitor platelet count and any signs of bleeding 3. Pain Management: Tylenol as needed 4. Hypertension.   Lopressor discontinued due to bradycardia.  Increased hydralazine 50 mg every 6 hours on 8/26, increased to 100 on 8/27  Norvasc 5 started 8/26  Will need to monitor closely, once off steroids  Will not make further changes at present and allow for permissive HTN, improving, goal SBP 130-150, adding flomax this may lower BP Vitals:   11/03/15 0022 11/03/15 0447  BP: (!) 170/72 (!) 160/65  Pulse:  88  Resp:  17  Temp:  98.9  F (37.2 C)   5. Sinus arrest due to vagal spell. Beta blocker has been discontinued for now. No additional episodes. Follow-up per cardiology services. No plan at this time for pacemaker. 6. Neuropsych: This patient is not fully capable of making decisions on his own behalf./  Started on Zoloft 8/27, changed to Prozaac on 8/28, d/ced 8/31 due to ?nausea 7. Skin/Wound Care: Routine skin check 8. Fluids/Electrolytes/Nutrition: Routine I&O's  9. Chronic renal insufficiency. Baseline creatinine 1.5.   Cr. At baseline  Will cont to monitor   10. Diet-controlled diabetes mellitus. Hemoglobin A1c 7.3. Check blood sugars before meals and at bedtime. Diabetic teaching.   Now off prednisone   -diet changed to CM medium  -on amaryl, am CBG at goal, PM CBGelevated, increase amaryl CBG (last 3)   Recent Labs  11/02/15 1144 11/02/15 1626 11/03/15 0719  GLUCAP 196* 225* 124*  11. CAD/SVT. No chest pain or shortness of breath.  12. Gout.attack resolved  monitor for reoccurance 13. Hyperlipidemia. Pravachol 14. ?Hematuria: resolved, question cath related 16. Urinary retention- starting to void  Confirmed with PVRs  started bethanechol 10 TID,  -discussed flomax with daughter and pt, she requests it gets restarted   17. Nausea- states he's had since he's been in hospital  -somewhat better  today  -had BM yesterday  -continue miralax and colace  Bethanechol started 18. Fatigue: likely multifactorial related to age/stroke  -no neuro changes on exam  - UA neg except +WBCs, few bact, await Cx, afebrile   LOS (Days) 13 A FACE TO FACE EVALUATION WAS PERFORMED  Tumeka Chimenti E 11/03/2015, 7:36 AM

## 2015-11-03 NOTE — Progress Notes (Signed)
Physical Therapy Session Note  Patient Details  Name: [name] MRN: [id number] Date of Birth: 05/02/1927  Today's Date: 11/03/2015 PT Individual Time: 1411-1520 PT Individual Time Calculation (min): 69 min    Short Term Goals: Week 2:  PT Short Term Goal 1 (Week 2): pt will transfer with min assist consistently PT Short Term Goal 2 (Week 2): pt will propel w/c x 150' with supervision PT Short Term Goal 3 (Week 2): pt will perform gait x 150' with LRAD and min assist PT Short Term Goal 4 (Week 2): pt will ascend and descend 12 steps with min assist  Skilled Therapeutic Interventions/Progress Updates:  Pt received supine in bed; pt reporting improvement in nausea today but continues to report feeling "woozy" and "seasick" with floaters in vision and objects "moving" in his vision.  Pt agreeable to vestibular evaluation; see below for details.  Following assessment pt reporting need to urinate; performed ambulation to/from toilet with RW, min A and use of letter X taped to RW to provide pt with target for visual/gaze stabilization during gait.  Performed toileting with supervision-min A.  Returned to bed where pt performed sit > supine with supervision.  Pt left with all items within reach.     1. History and Physical Examination    Pt reports symptoms of dizziness s/p extension of CVA.  Pt describes dizziness as feeling "sea sick" or "woozy" and c/o items in the environment "moving."  Pt rates dizziness at 2/10 at rest in supine increasing to 10/10 at worst but unable to name aggravating factors.  Pt reports feelings of dizziness are constant.  He reports that his vision does not seem as clear as before the CVA.  He also reports intermittent diplopia.  Pt reports nausea but no vomiting.  He denies headache or changes in hearing.TEMPO: (acute, chronic, intermittent spells).   2. Vestibular Assessment                           Gross neck ROM Limited due to guarding  Eye Alignment R ptosis   Oculomotor ROM WFL  Spontaneous  Nystagmus (room light and vision occluded) None seen in room light  Gaze holding nystagmus(room light and vision occluded) None seen in room light  Smooth pursuit Pt "loses" target due to impaired attention  Saccades Delayed  Vergence Impaired  VOR Cancellation N/T  Pressure Tests (vision occluded) N/T  VOR slow WFL  Head Thrust Test Unable to test due to neck mm guarding  Head Shaking Test (vision occluded) N/T  Dynamic Visual Acuity         3 line difference; abnormal  Rt. Hallpike Dix N/T  Lt. Hallpike Dix N/T  Rt. Roll Test N/T  Lt. Roll Test  N/T  MSQ Most provocative movements include: supine > sit, pivoting in standing, and head tipped to L and R knee  Cover-Cross Cover (if indicated) R eye rests in outward position when covered  Head-Neck Differentiation Test (if indicated) N/T   3. Findings:  Patient signs and symptoms consistent with motion sensitivity, central vertigo, impaired visual-vestibular interactions with impaired convergence and VOR.  4. Recommendations for Treatment: Adaptation with x 1 viewing in sitting with letter target 3 feet away against blank background x 30 seconds during vertical and horizontal head turns each; increase speed of head movements but keep target in focus.  Convergence and divergence training with near and far "x" targets; gaze stability training during gait with use  of letter target on RW.     Therapy Documentation Precautions:  Precautions Precautions: Fall Precaution Comments: right hemiparesis Restrictions Weight Bearing Restrictions: No General:   Vital Signs: Therapy Vitals Temp: 98 F (36.7 C) Temp Source: Oral Pulse Rate: 84 Resp: 17 BP: (!) 152/57 Patient Position (if appropriate): Sitting Oxygen Therapy SpO2: 95 % O2 Device: Not Delivered   See Function Navigator for Current Functional Status.   Therapy/Group: Individual Therapy  Raylene Everts [facility] 11/03/2015, 4:59 PM

## 2015-11-03 NOTE — Progress Notes (Signed)
Occupational Therapy Session Note  Patient Details  Name: [name] MRN: [id number] Date of Birth: 10-18-1927  Today's Date: 11/03/2015 OT Individual Time: 0901-1000 OT Individual Time Calculation (min): 59 min     Short Term Goals: Week 2:  OT Short Term Goal 1 (Week 2): Pt will transfer to the elevated toilet with min assist using LRAD. OT Short Term Goal 2 (Week 2): Pt will complete all bathing with no more than min assist.   OT Short Term Goal 3 (Week 2): Pt will complete LB dressing with min assist using AE PRN. OT Short Term Goal 4 (Week 2): Pt will perform shower transfers with min assist to the walk-in shower for 3 consecutive sessions. OT Short Term Goal 5 (Week 2): Pt will use the RUE for all dressing tasks at a diminshed level with supervision.    Skilled Therapeutic Interventions/Progress Updates:    Pt completed ADL session as well as toileting tasks.  Min assist for transfer to the shower bench and elevated toilet with use of the RW.  Pt still reporting dizziness in sitting and tends to worsen during transfers or positional changes.  Uses the RUE as an active assist for bathing and dressing tasks with increased time.  Still with decreased ability to use the dominant RUE for peri hygiene.  Min instructional cueing for sequencing throughout bathing as pt tends to wash the same parts over and over at times.   Min instructional cueing to push shirt over the right elbow before attempting to pull over his head.  He was able to donn both shoes but needed assist to tie them.  Pt left in wheelchair beside of bed with call button in reach and safety belt in place.   Therapy Documentation Precautions:  Precautions Precautions: Fall Precaution Comments: right hemiparesis Restrictions Weight Bearing Restrictions: No  Pain: Pain Assessment Pain Assessment: No/denies pain ADL: See Function Navigator for Current Functional Status.   Therapy/Group: Individual Therapy  Zayah Keilman  OTR/L 11/03/2015, 10:02 AM

## 2015-11-03 NOTE — Progress Notes (Signed)
Physical Therapy Note  Patient Details  Name: [name] MRN: [id number] Date of Birth: 02-Dec-1927 Today's Date: 11/03/2015    Time: 1300-1329 29 minutes  1:1 No c/o pain. Pt with no c/o nausea during session. Gait throughout unit with close supervision, gait on carpet with obstacle negotiation with min A to avoid obstacles on the Rt side.  Furniture transfers to sofa and chair in family room with pt requiring only min A to get up from each.  Toilet transfers and clothing negotiation with supervision for transfer, min A for balance with clothing negotiation. Pt in better spirits this session.   DONAWERTH,KAREN 11/03/2015, 1:30 PM

## 2015-11-03 NOTE — Progress Notes (Signed)
Speech Language Pathology Daily Session Note  Patient Details  Name: [name] MRN: [id number] Date of Birth: 04/10/1927  Today's Date: 11/03/2015 SLP Individual Time: 1017-1100 SLP Individual Time Calculation (min): 43 min   Short Term Goals:Week 2: SLP Short Term Goal 1 (Week 2): Pt will demonstrate intellectual awareness within functional tasks with Min A cues.  SLP Short Term Goal 2 (Week 2): Pt will utilize speech intelligibility strategies at the simple conversation level with supervision cues. to achieve greater than 90% intelligibility over 3 targeted sessions SLP Short Term Goal 3 (Week 2): Pt will demonstrate functional problem solving for mildly complex tasks with Min A verbal cues.  SLP Short Term Goal 4 (Week 2): Pt will demonstrate sustained attention to task for 5 minutes with Mod assist verbal cues for redirection  Skilled Therapeutic Interventions: Pt was seen for skilled ST targeting cognitive goals.  Pt with complaints of nausea and dry heaving during beginning of today's therapy session.  RN made aware and administered medications. Pt was agreeable to participating in therapy with min encouragement.  Therapist facilitated the session with a novel card game targeting functional problem solving.  Pt initially required mod assist verbal cues to plan and execute a problem solving strategy during the abovementioned task due to decreased working memory and decreased mental flexibility.  However, as task progressed therapist was able to fade cues to min assist-supervision due to increased task familiarity and repetition of task rules and procedures.  Pt was returned to room at the end of today's therapy session and left with daughter at bedside.  Continue per current plan of care.       Function:  Eating Eating                 Cognition Comprehension Comprehension assist level: Follows basic conversation/direction with no assist  Expression   Expression assist level:  Expresses basic 75 - 89% of the time/requires cueing 10 - 24% of the time. Needs helper to occlude trach/needs to repeat words.  Social Interaction Social Interaction assist level: Interacts appropriately 90% of the time - Needs monitoring or encouragement for participation or interaction.  Problem Solving Problem solving assist level: Solves basic 75 - 89% of the time/requires cueing 10 - 24% of the time  Memory Memory assist level: Recognizes or recalls 50 - 74% of the time/requires cueing 25 - 49% of the time    Pain Pain Assessment Pain Assessment: No/denies pain  Therapy/Group: Individual Therapy  Lennart Gladish, Selinda Orion 11/03/2015, 11:14 AM

## 2015-11-04 ENCOUNTER — Inpatient Hospital Stay (HOSPITAL_COMMUNITY): Payer: Medicare Other | Admitting: Speech Pathology

## 2015-11-04 ENCOUNTER — Inpatient Hospital Stay (HOSPITAL_COMMUNITY): Payer: Medicare Other

## 2015-11-04 ENCOUNTER — Inpatient Hospital Stay (HOSPITAL_COMMUNITY): Payer: Medicare Other | Admitting: Occupational Therapy

## 2015-11-04 ENCOUNTER — Inpatient Hospital Stay (HOSPITAL_COMMUNITY): Payer: Medicare Other | Admitting: Physical Therapy

## 2015-11-04 DIAGNOSIS — N390 Urinary tract infection, site not specified: Secondary | ICD-10-CM

## 2015-11-04 LAB — URINE CULTURE

## 2015-11-04 LAB — GLUCOSE, CAPILLARY
GLUCOSE-CAPILLARY: 142 mg/dL — AB (ref 65–99)
GLUCOSE-CAPILLARY: 237 mg/dL — AB (ref 65–99)
GLUCOSE-CAPILLARY: 187 mg/dL — AB (ref 65–99)
Glucose-Capillary: 207 mg/dL — ABNORMAL HIGH (ref 65–99)

## 2015-11-04 MED ORDER — AMPICILLIN 250 MG PO CAPS
250.0000 mg | ORAL_CAPSULE | Freq: Four times a day (QID) | ORAL | Status: DC
  Administered 2015-11-04 – 2015-11-11 (×29): 250 mg via ORAL
  Filled 2015-11-04 (×31): qty 1

## 2015-11-04 NOTE — Progress Notes (Signed)
Physical Therapy Weekly Progress Note  Patient Details  Name: [name] MRN: [id number] Date of Birth: January 18, 1928  Beginning of progress report period: 10/29/15 End of progress report period: 11/04/15 Today's Date: 11/04/2015 PT Individual Time: 1300-1315; 1345-1430 PT Individual Time Calculation (min): 15 min , 45 min  Patient has met 4 of 4 short term goals.    Patient continues to demonstrate the following deficits: balance, vestibular, activity tolerance, mobility and lococmotion,  and therefore will continue to benefit from skilled PT intervention to enhance overall performance with activity tolerance, balance, postural control, ability to compensate for deficits, functional use of  right upper extremity and right lower extremity, attention and awareness.  Patient progressing toward long term goals..  Continue plan of care.  PT Short Term Goals Week 2:  PT Short Term Goal 1 (Week 2): pt will transfer with min assist consistently PT Short Term Goal 1 - Progress (Week 2): Met PT Short Term Goal 2 (Week 2): pt will propel w/c x 150' with supervision PT Short Term Goal 2 - Progress (Week 2): Met PT Short Term Goal 3 (Week 2): pt will perform gait x 150' with LRAD and min assist PT Short Term Goal 3 - Progress (Week 2): Met PT Short Term Goal 4 (Week 2): pt will ascend and descend 12 steps with min assist PT Short Term Goal 4 - Progress (Week 2): Met  Week 3= LTGs due to LOS    Skilled Therapeutic Interventions/Progress Updates: tx 1: toilet transfer using RW for gait in room and to toilet; NT took over with pt.  tx 2: Vision exs in sitting: convergence/divergence with items 2' and 4' away, x 30 seconds x 2, and eye gaze stabilization with head turns vertical and horizontal x 30 sec x 2 each. Pt unable to state that exs increase vertigo or dizziness, even with increased speed.  Gait with Rw x 150' over level tile. LOB backward during transitional turn to R/backwards as he sat, requiring  mod assist to regain.   neuromuscular re-education via forced use for w/c propulsion using bil UEs and bil LEs x 150' with supervision. Standing heeltoe raises x 10 with bil UE support.  Pt left resting in w/c with quick release belt applied and all needs within reach.     Therapy Documentation Precautions:  Precautions Precautions: Fall Precaution Comments: right hemiparesis Restrictions Weight Bearing Restrictions: No   Pain: Pain Assessment Pain Assessment: No/denies pain   Other Treatments: Treatments Neuromuscular Facilitation: Right;Upper Extremity;Lower Extremity;Activity to increase timing and sequencing;Activity to increase motor control;Forced use;Activity to increase lateral weight shifting;Activity to increase anterior-posterior weight shifting   See Function Navigator for Current Functional Status.  Therapy/Group: Individual Therapy  Juliana Boling 11/04/2015, 2:24 PM

## 2015-11-04 NOTE — Patient Care Conference (Signed)
Inpatient RehabilitationTeam Conference and Plan of Care Update Date: 11/04/2015   Time: 10:45 Am    Patient Name: [name]      Medical Record Number: [id number]  Date of Birth: 1927/03/13 Sex: Male         Room/Bed: 4W23C/4W23C-01 Payor Info: Payor: [place] / Plan: [employer] MEDICARE / Product Type: *No Product type* /    Admitting Diagnosis: CVA  Admit Date/Time:  10/21/2015  5:15 PM Admission Comments: No comment available   Primary Diagnosis:  <principal problem not specified> Principal Problem: <principal problem not specified>  Patient Active Problem List   Diagnosis Date Noted  . Nausea without vomiting   . Urinary retention   . 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 Urinary hesitancy   . Right sided weakness   . Acute ischemic left MCA stroke ([place])   . Hematuria   . Bleeding   . Leukocytosis   . Depression   . Steroid-induced hyperglycemia   . Left pontine CVA ([place]) 10/21/2015  . Dysarthria, post-stroke   . Right hemiparesis ([place])   . Sinus arrest   . CKD (chronic kidney disease)   . DM type 2 without retinopathy ([place])   . Coronary artery disease involving native coronary artery of native heart without angina pectoris   . Acute idiopathic gout of right hand   . Aphasia   . Dysphagia, post-stroke   . HLD (hyperlipidemia)   . Benign essential HTN   . Orthostatic hypotension   . 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[place]) 10/14/2015  . CVA (cerebral infarction) 10/14/2015  . Accelerated hypertension 10/14/2015  . Type 2 diabetes mellitus with circulatory disorder ([place]) 10/14/2015  . Peripheral vascular disease, unspecified ([place]) 06/04/2013    Expected Discharge Date: Expected Discharge Date: 11/11/15  Team Members Present: Physician leading conference: Dr. Alysia Penna Social Worker Present: Ovidio Kin, LCSW Nurse Present: Heather Roberts, RN PT Present: Georjean Mode,  PT OT Present: Clyda Greener, OT SLP Present: Windell Moulding, SLP PPS Coordinator present : Daiva Nakayama, RN, CRRN     Current Status/Progress Goal Weekly Team Focus  Medical   Vestibular dysfunction, cerebellar peduncle infarct with nausea  improve movement sensitivity  vestibular exercises   Bowel/Bladder   I/O cath q 6-8 hrs PRN, LBM 11/03/15 cont bowel and bladder- Pt voids with low PVRs  min assist  monitor q shift   Swallow/Nutrition/ Hydration   Pt and family report pt to be baseline for swallowing function   mod I   completion of family education regarding Dys 3 textures    ADL's   supervision for UB bathing and dressing,  min assist LB bathing and dressing, min guard assist for transfers to the shower and toilet using the RW.  Still with increased dizziness and nausea in sitting and with positional changes.    supervision overall  selfcare re-training, transfer training, balance re-training, therapeutic exercise, neuromuscular re-education, pt/family education   Mobility   fluctuates between min/mod A with mobility, min A gait  supervision overall including gait x 150' and up/down 12 steps  family ed, activity tolerance   Communication   supervision-mod I; depends on fatigue   mod I   continue education and carryover of intelligibility    Safety/Cognition/ Behavioral Observations  fluctuates due to fatigue; overall min-mod assist   min assist   attention to tasks, problem solving, awareness of deficits    Pain   no complaints of  pain  pain less than or equal to 2  monitor q shift   Skin   no skin break down  no skin break down this admission  monitor q shift      *See Care Plan and progress notes for long and short-term goals.  Barriers to Discharge: pt has poor awareness of stroke deficits, still     Possible Resolutions to Barriers:       Discharge Planning/Teaching Needs:  Daughter has been here to observe in the[place]. Will need to come back to do hands on care.  Neuro-psych saw for coping      Team Discussion:  Vestibular eval yesterday to assist with nausea and dizziness.  Fatigues easily and working on this. UTI being treated now. Distracted by his dizziness and limits his therapy participation. Will stay on soft diet at discharge. Needs cues for memory and attention. Will have daughter come in for family education early next week. Assessing voiding has started to void no caths since 9/4. PVR's remain low.   Revisions to Treatment Plan:  DC 9/13   Continued Need for Acute Rehabilitation Level of Care: The patient requires daily medical management by a physician with specialized training in physical medicine and rehabilitation for the following conditions: Daily direction of a multidisciplinary physical rehabilitation program to ensure safe treatment while eliciting the highest outcome that is of practical value to the patient.: Yes Daily medical management of patient stability for increased activity during participation in an intensive rehabilitation regime.: Yes Daily analysis of laboratory values and/or radiology reports with any subsequent need for medication adjustment of medical intervention for : Neurological problems  [name], [name] 11/04/2015, 1:42 PM

## 2015-11-04 NOTE — Progress Notes (Signed)
Physical Therapy Session Note  Patient Details  Name: [name] MRN: [id number] Date of Birth: Aug 18, 1927  Today's Date: 11/04/2015 PT Individual Time: 0902-0958 PT Individual Time Calculation (min): 56 min    Short Term Goals: Week 2:  PT Short Term Goal 1 (Week 2): pt will transfer with min assist consistently PT Short Term Goal 1 - Progress (Week 2): Met PT Short Term Goal 2 (Week 2): pt will propel w/c x 150' with supervision PT Short Term Goal 2 - Progress (Week 2): Met PT Short Term Goal 3 (Week 2): pt will perform gait x 150' with LRAD and min assist PT Short Term Goal 3 - Progress (Week 2): Met PT Short Term Goal 4 (Week 2): pt will ascend and descend 12 steps with min assist PT Short Term Goal 4 - Progress (Week 2): Met  Skilled Therapeutic Interventions/Progress Updates:     Patient received supine in bed and agreeable to PT. Patient reports increased "heaviness and numbness" in R LE and R UE. Supine>sit transfer with supervision A, HOB elevated, and moderate use of bed features.   Patient performed stand pivot transfer with RW and supervision Assist from PT x 5 throughout treatment.   Patient performed WC mobility for 125f with mod A from PT to maintain straight trajectory and increase use of R UE to prevent hitting obstacles on R side.   PT instructed patient in VOR horizontal with 3 sets x 45 seconds and vertical 2 x 45 seconds. Patient required moderate cues to remain on task and maintain gaze on target.   Patient instructed by PT in gait training for 1234f And 20085fith RW and close supervision A from PT. PT provided mod cues for improved step height and step length on the R LE. Patient reports that he is not moving the R LE as well as previous days, not able to make corrections to gait pattern with cues from PT.   Step training for toe taps on 6 inch step x 8 BLE with BUE support on RW.   [facility] and all needs met at end  of treatment session.        Therapy Documentation Precautions:  Precautions Precautions: Fall Precaution Comments: right hemiparesis Restrictions Weight Bearing Restrictions: No General:   Vital Signs: Therapy Vitals Pulse Rate: 85 BP: 137/71 Patient Position (if appropriate): Sitting Pain: Pain Assessment Pain Assessment: No/denies pain   See Function Navigator for Current Functional Status.   Therapy/Group: Individual Therapy  AusLorie Phenix6/2017, 2:51 PM

## 2015-11-04 NOTE — Progress Notes (Signed)
Speech Language Pathology Daily Session Note  Patient Details  Name: [name] MRN: [id number] Date of Birth: Oct 18, 1927  Today's Date: 11/04/2015 SLP Individual Time: 1002-1030 SLP Individual Time Calculation (min): 28 min   Short Term Goals: Week 2: SLP Short Term Goal 1 (Week 2): Pt will demonstrate intellectual awareness within functional tasks with Min A cues.  SLP Short Term Goal 2 (Week 2): Pt will utilize speech intelligibility strategies at the simple conversation level with supervision cues. to achieve greater than 90% intelligibility over 3 targeted sessions SLP Short Term Goal 3 (Week 2): Pt will demonstrate functional problem solving for mildly complex tasks with Min A verbal cues.  SLP Short Term Goal 4 (Week 2): Pt will demonstrate sustained attention to task for 5 minutes with Mod assist verbal cues for redirection  Skilled Therapeutic Interventions:  Pt was seen for skilled ST targeting cognitive goals.  Pt seated on commode upon arrival and ambulated to sink once finished with toileting.  Pt required min assist verbal cues for safe walker use during mobility.   Pt recalled 2 out of 3 details from card game taught during yesterday's therapy session with supervision question cues, which improved to 3 out of 3 recall with with min assist verbal cues.    Pt required min assist verbal cues for working memory of task rules and procedures in the moment when problem solving during activity.  Pt was returned to room and left with call bell within reach and quick release belt donned for safety.  Continue per current plan of care.    Function:  Eating Eating                 Cognition Comprehension Comprehension assist level: Follows basic conversation/direction with extra time/assistive device  Expression   Expression assist level: Expresses basic 90% of the time/requires cueing < 10% of the time.  Social Interaction Social Interaction assist level: Interacts appropriately 90%  of the time - Needs monitoring or encouragement for participation or interaction.  Problem Solving Problem solving assist level: Solves basic 75 - 89% of the time/requires cueing 10 - 24% of the time  Memory Memory assist level: Recognizes or recalls 50 - 74% of the time/requires cueing 25 - 49% of the time    Pain Pain Assessment Pain Assessment: No/denies pain  Therapy/Group: Individual Therapy  Norman Bier, Elmyra Ricks L 11/04/2015, 11:03 AM

## 2015-11-04 NOTE — Progress Notes (Signed)
Social Work Patient ID: [name], male   DOB: 1927-05-05, 82 y.o.   MRN: [id number]  Met with pt and spoke with Okey Regal via telephone to discuss team conference progress and discharge 9/13. Daughter wanted to know if he can stay until Sat since she will have more help at home. Discussed insurance and MD would not be able to justify him staying until Sat if he had reached his goals. Discussed having her come in and attend therapies and learn his care. She will come in Sat and next Monday. Pt reports feeling pain in different body parts daily. Will work on discharge needs.

## 2015-11-04 NOTE — Progress Notes (Signed)
Subjective/Complaints: No issues overnite, voided yesterday pm but needed cath in am Some dorsum hand pain on left, not in joint   ROS-  +Right hand tingling, heaviness,Denies CP,SOB,    Objective: Vital Signs: Blood pressure (!) 158/54, pulse 95, temperature 99.1 F (37.3 C), temperature source Oral, resp. rate 16, height 5' 8"  (1.727 m), weight 75.2 kg (165 lb 12.6 oz), SpO2 98 %. No results found. Results for orders placed or performed during the hospital encounter of 10/21/15 (from the past 72 hour(s))  Glucose, capillary     Status: Abnormal   Collection Time: 11/01/15 12:01 PM  Result Value Ref Range   Glucose-Capillary 203 (H) 65 - 99 mg/dL  Glucose, capillary     Status: Abnormal   Collection Time: 11/01/15  4:55 PM  Result Value Ref Range   Glucose-Capillary 199 (H) 65 - 99 mg/dL  Urinalysis, Routine w reflex microscopic (not at [facility])     Status: Abnormal   Collection Time: 11/01/15  6:59 PM  Result Value Ref Range   Color, Urine YELLOW YELLOW   APPearance CLEAR CLEAR   Specific Gravity, Urine 1.025 1.005 - 1.030   pH 5.5 5.0 - 8.0   Glucose, UA 500 (A) NEGATIVE mg/dL   Hgb urine dipstick NEGATIVE NEGATIVE   Bilirubin Urine NEGATIVE NEGATIVE   Ketones, ur NEGATIVE NEGATIVE mg/dL   Protein, ur NEGATIVE NEGATIVE mg/dL   Nitrite NEGATIVE NEGATIVE   Leukocytes, UA TRACE (A) NEGATIVE  Culture, Urine     Status: Abnormal   Collection Time: 11/01/15  6:59 PM  Result Value Ref Range   Specimen Description URINE, RANDOM    Special Requests NONE    Culture >=100,000 COLONIES/mL ENTEROCOCCUS FAECALIS (A)    Report Status 11/04/2015 FINAL    Organism ID, Bacteria ENTEROCOCCUS FAECALIS (A)       Susceptibility   Enterococcus faecalis - MIC*    AMPICILLIN <=2 SENSITIVE Sensitive     LEVOFLOXACIN 1 SENSITIVE Sensitive     NITROFURANTOIN <=16 SENSITIVE Sensitive     VANCOMYCIN <=0.5 SENSITIVE Sensitive     * >=100,000 COLONIES/mL ENTEROCOCCUS FAECALIS  Urine  microscopic-add on     Status: Abnormal   Collection Time: 11/01/15  6:59 PM  Result Value Ref Range   Squamous Epithelial / LPF 0-5 (A) NONE SEEN   WBC, UA 6-30 0 - 5 WBC/hpf   RBC / HPF 0-5 0 - 5 RBC/hpf   Bacteria, UA FEW (A) NONE SEEN  Glucose, capillary     Status: Abnormal   Collection Time: 11/01/15  8:53 PM  Result Value Ref Range   Glucose-Capillary 158 (H) 65 - 99 mg/dL  Glucose, capillary     Status: Abnormal   Collection Time: 11/02/15  6:05 AM  Result Value Ref Range   Glucose-Capillary 118 (H) 65 - 99 mg/dL  Glucose, capillary     Status: Abnormal   Collection Time: 11/02/15 11:44 AM  Result Value Ref Range   Glucose-Capillary 196 (H) 65 - 99 mg/dL  Glucose, capillary     Status: Abnormal   Collection Time: 11/02/15  4:26 PM  Result Value Ref Range   Glucose-Capillary 225 (H) 65 - 99 mg/dL  Glucose, capillary     Status: Abnormal   Collection Time: 11/03/15  7:19 AM  Result Value Ref Range   Glucose-Capillary 124 (H) 65 - 99 mg/dL   Comment 1 Notify RN   Glucose, capillary     Status: Abnormal   Collection Time: 11/03/15  11:42 AM  Result Value Ref Range   Glucose-Capillary 319 (H) 65 - 99 mg/dL  Glucose, capillary     Status: Abnormal   Collection Time: 11/03/15  4:30 PM  Result Value Ref Range   Glucose-Capillary 195 (H) 65 - 99 mg/dL  Glucose, capillary     Status: Abnormal   Collection Time: 11/03/15  9:45 PM  Result Value Ref Range   Glucose-Capillary 143 (H) 65 - 99 mg/dL  Glucose, capillary     Status: Abnormal   Collection Time: 11/04/15  6:39 AM  Result Value Ref Range   Glucose-Capillary 142 (H) 65 - 99 mg/dL     HEENT: normocephalic, atraumatic. More alert  Cardio: RRR and no murmur Resp: CTA B/L and unlabored GI: BS positive and NT, ND Skin:   Intact. Warm and dry. Neuro: drowsy but arousable  Motor 4-/5 R delt, bi, tri, grip. Apraxia. Decreased [place]. (?effort) 4/5 R HF, KE ,4+/5 ADF/PF (?effort) LUE/LLE: 4+/5 proximal to  distal Dysarthria I SEE NO CHANGES IN EXAM Musc/Skel:  No edema. No erythema. Gen: NAD. Vitals signs reviewed.    Assessment/Plan: 1. Functional deficits secondary to Left pontine infarct and new left posterior lateral temporal infarct which require 3+ hours per day of interdisciplinary therapy in a comprehensive inpatient rehab setting. Physiatrist is providing close team supervision and 24 hour management of active medical problems listed below. Physiatrist and rehab team continue to assess barriers to discharge/monitor patient progress toward functional and medical goals. FIM: Function - Bathing Position: Shower Body parts bathed by patient: Right arm, Left arm, Chest, Abdomen, Front perineal area, Buttocks, Right upper leg, Left upper leg, Left lower leg Body parts bathed by helper: Right lower leg, Back Bathing not applicable: Right upper leg, Left upper leg, Right lower leg, Left lower leg (Did not attempt this session) Assist Level: Touching or steadying assistance(Pt > 75%)  Function- Upper Body Dressing/Undressing What is the patient wearing?: Pull over shirt/dress Pull over shirt/dress - Perfomed by patient: Thread/unthread right sleeve, Thread/unthread left sleeve, Put head through opening, Pull shirt over trunk Pull over shirt/dress - Perfomed by helper: Pull shirt over trunk Assist Level: Supervision or verbal cues Function - Lower Body Dressing/Undressing What is the patient wearing?: Pants, Maryln Manuel, Shoes Position: Education officer, museum at [employer] - Performed by patient: Thread/unthread left underwear leg, Pull underwear up/down Underwear - Performed by helper: Pull underwear up/down, Thread/unthread left underwear leg, Thread/unthread right underwear leg Pants- Performed by patient: Thread/unthread right pants leg, Thread/unthread left pants leg, Pull pants up/down Pants- Performed by helper: Fasten/unfasten pants, Pull pants up/down, Thread/unthread right pants leg,  Thread/unthread left pants leg Non-skid slipper socks- Performed by helper: Don/doff right sock, Don/doff left sock TED Hose - Performed by helper: Don/doff right TED hose, Don/doff left TED hose Assist for footwear: Dependant Assist for lower body dressing: Touching or steadying assistance (Pt > 75%)  Function - Toileting Toileting activity did not occur: Refused Toileting steps completed by patient: Adjust clothing prior to toileting, Performs perineal hygiene, Adjust clothing after toileting Toileting steps completed by helper: Adjust clothing prior to toileting, Performs perineal hygiene, Adjust clothing after toileting Toileting Assistive Devices: Grab bar or rail Assist level: Touching or steadying assistance (Pt.75%)  Function - Air cabin crew transfer assistive device: Walker, Grab bar Assist level to toilet: Touching or steadying assistance (Pt > 75%) Assist level from toilet: Touching or steadying assistance (Pt > 75%)  Function - Chair/bed transfer Chair/bed transfer method: Stand pivot Chair/bed transfer assist  level: Touching or steadying assistance (Pt > 75%) Chair/bed transfer assistive device: Armrests, Walker Chair/bed transfer details: Manual facilitation for weight shifting, Verbal cues for sequencing, Verbal cues for technique  Function - Locomotion: Wheelchair Will patient use wheelchair at discharge?: Yes Type: Manual Max wheelchair distance: 150 Assist Level: Touching or steadying assistance (Pt > 75%) Assist Level: Touching or steadying assistance (Pt > 75%) Assist Level: Touching or steadying assistance (Pt > 75%) Turns around,maneuvers to table,bed, and toilet,negotiates 3% grade,maneuvers on rugs and over doorsills: No Function - Locomotion: Ambulation Assistive device: Walker-rolling Max distance: 150 Assist level: Supervision or verbal cues Assist level: Supervision or verbal cues Walk 50 feet with 2 turns activity did not occur:  Safety/medical concerns Assist level: Supervision or verbal cues Walk 150 feet activity did not occur: Safety/medical concerns Assist level: Supervision or verbal cues Walk 10 feet on uneven surfaces activity did not occur: Safety/medical concerns Assist level: Maximal assist (Pt 25 - 49%)  Function - Comprehension Comprehension: Auditory Comprehension assist level: Follows complex conversation/direction with no assist  Function - Expression Expression: Verbal Expression assist level: Expresses basic 75 - 89% of the time/requires cueing 10 - 24% of the time. Needs helper to occlude trach/needs to repeat words.  Function - Social Interaction Social Interaction assist level: Interacts appropriately 90% of the time - Needs monitoring or encouragement for participation or interaction.  Function - Problem Solving Problem solving assist level: Solves basic 75 - 89% of the time/requires cueing 10 - 24% of the time  Function - Memory Memory assist level: Recognizes or recalls 50 - 74% of the time/requires cueing 25 - 49% of the time Patient normally able to recall (first 3 days only): Current season  Medical Problem List and Plan:   1.  Right hemiparesis dysarthria/dysphagia secondary to left pontine infarct on 10/14/15 and left posterior lateral temporal infarct 8/27  Cont CIR, PT, OT, SLP- Team conference today please see physician documentation under team conference tab, met with team face-to-face to discuss problems,progress, and goals. Formulized individual treatment plan based on medical history, underlying problem and comorbidities. 2.  DVT Prophylaxis/Anticoagulation: Subcutaneous heparin changed to lovenox. Monitor platelet count and any signs of bleeding 3. Pain Management: Tylenol as needed 4. Hypertension.   Lopressor discontinued due to bradycardia.  Increased hydralazine 50 mg every 6 hours on 8/26, increased to 100 on 8/27  Norvasc 5 started 8/26   Will not make further  changes at present and allow for permissive HTN, improving, goal SBP 130-150, adding flomax this may lower BP Vitals:   11/03/15 2306 11/04/15 0507  BP: (!) 151/56 (!) 158/54  Pulse:  95  Resp:  16  Temp:  99.1 F (37.3 C)   5. Sinus arrest due to vagal spell. Beta blocker has been discontinued for now. No additional episodes. Follow-up per cardiology services. No plan at this time for pacemaker. 6. Neuropsych: This patient is not fully capable of making decisions on his own behalf./  Started on Zoloft 8/27, changed to Prozaac on 8/28, d/ced 8/31 due to ?nausea 7. Skin/Wound Care: Routine skin check 8. Fluids/Electrolytes/Nutrition: Routine I&O's  9. Chronic renal insufficiency. Baseline creatinine 1.5.   Cr. At baseline  Will cont to monitor   10. Diet-controlled diabetes mellitus. Hemoglobin A1c 7.3. Check blood sugars before meals and at bedtime. Diabetic teaching.   Now off prednisone   -diet changed to CM medium  -on amaryl, am CBG at goal, PM CBGelevated, increase amaryl CBG (last 3)   Recent  Labs  11/03/15 1630 11/03/15 2145 11/04/15 0639  GLUCAP 195* 143* 142*  11. CAD/SVT. No chest pain or shortness of breath.  12. Gout.attack resolved  monitor for reoccurance 13. Hyperlipidemia. Pravachol 14. ?Hematuria: resolved, question cath related 16. Urinary retention- starting to void  Confirmed with PVRs  started bethanechol 10 TID,  -cont flomax  17. Nausea- improved 18. ? UTI  - UA neg except +WBCs, few bact, enterococcus >100K,   course of amox given cath hx   LOS (Days) 14 A FACE TO FACE EVALUATION WAS PERFORMED  Breyden Jeudy E 11/04/2015, 8:25 AM

## 2015-11-04 NOTE — Consult Note (Signed)
NEUROBEHAVIORAL STATUS EXAM - [facility]   MEDICAL NECESSITY:  [name] was seen on the [facility] for a neurobehavioral status exam owing to the patient's diagnosis of CVA, and to assist in treatment planning during admission.   Records indicate that [name] is an "82 y.o. male with history of Gout, CAD maintained on aspirin, SVT, CKD with creatinine 1.50, T2DM PVD with RLE claudication who was admitted on 10/14/15 with difficulty talking, difficulty moving RLE and difficulty with RUE coordination. Per chart review patient lives alone independently using a walker prior to admission.  Patient had stopped taking his ASA 8/13 in anticipation of upcoming dental procedure. UDS negative.  MRI brain/CT angiogram of head and neck done revealing acute left pontine infarct, generalized atrophy, moderate narrowing midportion of basilar artery and multifocal moderate to severe narrowing of intracranial internal carotid arteries L > R.Carotid dopplers without significant stenosis. .On the afternoon of 10/20/2015 after arrangements being made to be admitted to CIR patient while sitting up in chair became unresponsive with heart rate in the 40s. Patient had received metoprolol [that] morning. EKG showed prolongation of the P-P interval with resultant significant pause and heart rate 56. Rapid response contacted. He was transferred to ICU and cardiology consulted. It was felt asystole likely vagally mediated and no acute indication for pacemaker and cardiology signed off as patient had no additional episodes."  During today's visit, [name] was accompanied by his daughter [name]) who assisted with the history. Patient acknowledged suffering from post-stroke memory loss, poor attention and concentration, and slowed processing speed. He also reported what is best described as possible visuoperceptual disturbance. Of note: [name] daughter did not  seem too concerned about any cognitive deficits and reported that she has not noticed any major memory issues.   From an emotional standpoint, [name] denied ever being diagnosed with or treated for any mental health disorder. He also denied ever experiencing any prolonged periods of depression or anxiety throughout his life. He admitted to being somewhat down of late given his loss in physical functioning and loss of independence. He has always been the caregiver to his family and he feels like a burden taking on the opposite role. Suicidal/homicidal ideation, plan or intent was denied. No manic or hypomanic episodes were reported. The patient denied ever experiencing any auditory/visual hallucinations. No major behavioral or personality changes were endorsed.   Therapy has reportedly been going well and he feels that he is making some progress; albeit slow. [name] described the rehab staff as "good." Cognitive issues could be a mild barrier to therapy but I think they are circumventable. Patient was living alone but he plans to live with his daughter for a while upon discharge to assist with his care. He feels okay with this plan.   PROCEDURES: [2 units 96116] Diagnostic clinical interview  Review of available records  MENTAL STATUS EXAM: APPEARANCE:  Normal/appropriate GEN:  Alert and oriented MOOD:  Depressed        AFFECT:  Blunted  SPEECH:  Speech was slow with no other major issues either expressive or receptive     THOUGHT CONTENT:  Appropriate HALLUCINATIONS:  None INTELLIGENCE:  Average  INSIGHT:  Fair JUDGMENT:  Fair SUICIDAL IDEATION:  Denies SI   HOMICIDAL IDEATION:   Denies HI   IMPRESSION: Overall, Mr. Rostad endorsed suffering from post-stroke cognitive issues and he did appear to exhibit at least slowed processing speed. I think brief neuropsychological  examination prior to discharge would be beneficial in order to assist with discharge recommendations. This can be  scheduled with my colleague later this week or with me next Monday. Emotionally, I think that the patient is suffering from a mild degree of depression in reaction to the sudden loss of independence and physical functioning, though I do not feel it requires treatment at this point.   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

## 2015-11-04 NOTE — Progress Notes (Signed)
Occupational Therapy Weekly Progress Note  Patient Details  Name: [name] MRN: [id number] Date of Birth: Jul 06, 1927  Beginning of progress report period: September 31, 2017 End of progress report period: November 04, 2015  Today's Date: 11/04/2015 OT Individual Time: 1105-1200 OT Individual Time Calculation (min): 55 min     Patient has met 5 of 5 short term goals.  Pt is making steady progress with OT at this time but he does continue to be limited by dizziness and nausea from extension of the pontine CVA.  RUE function continues to improve where he is able to integrate it into selfcare tasks at a supervision level with bathing and pulling pants over hips.  Pt continues to complete all UB selfcare with supervision and LB selfcare at a min assist level.  Functional transfers to the shower and toilet are also at a min assist level with use of the RW.  Pt needs cueing for sequencing of selfcare tasks and for motivation as he continues to perseverate on his dizziness and not feeling well.  He is on target to meet supervision level goals with anticipated discharge of 11/11/15.  Will continue with current OT treatment plan.  Patient continues to demonstrate the following deficits: decreased balance, decreased RUE and RLE strength, increased dizziness, and therefore will continue to benefit from skilled OT intervention to enhance overall performance with BADL.  Patient progressing toward long term goals..  Continue plan of care.  OT Short Term Goals Week 3:  OT Short Term Goal 1 (Week 3): Continue working on [facility] at supervision.     Skilled Therapeutic Interventions/Progress Updates:    Pt completed bathing, dressing, and toileting during session.  Min assist for all transfers to the toilet and the walk-in shower.  Min instructional cueing to sequence through bathing and not perseverate on washing the same body parts over and over.  He was able to complete donning brief over both  feet as well as pants and then pull over hips with min guard assist.  He was not able to tie his shoes however after donning.  May benefit from elastic shoe laces.  Last part of session provided handout on gaze stabilization exercises to be completed in sitting.  He was able to return demonstrate with mod assist with horizontal AROM and vertical AROM.  Pt left in wheelchair with call button in reach.   Therapy Documentation Precautions:  Precautions Precautions: Fall Precaution Comments: right hemiparesis Restrictions Weight Bearing Restrictions: No  Pain: Pain Assessment Pain Assessment: No/denies pain ADL: See Function Navigator for Current Functional Status.   Therapy/Group: Individual Therapy  Tamica Covell OTR/L 11/04/2015, 12:32 PM

## 2015-11-05 ENCOUNTER — Inpatient Hospital Stay (HOSPITAL_COMMUNITY): Payer: Medicare Other

## 2015-11-05 ENCOUNTER — Inpatient Hospital Stay (HOSPITAL_COMMUNITY): Payer: Medicare Other | Admitting: Occupational Therapy

## 2015-11-05 ENCOUNTER — Inpatient Hospital Stay (HOSPITAL_COMMUNITY): Payer: Medicare Other | Admitting: Speech Pathology

## 2015-11-05 LAB — GLUCOSE, CAPILLARY
GLUCOSE-CAPILLARY: 109 mg/dL — AB (ref 65–99)
Glucose-Capillary: 130 mg/dL — ABNORMAL HIGH (ref 65–99)
Glucose-Capillary: 210 mg/dL — ABNORMAL HIGH (ref 65–99)

## 2015-11-05 NOTE — Progress Notes (Signed)
Occupational Therapy Session Note  Patient Details  Name: [name] MRN: [id number] Date of Birth: 1927-05-24  Today's Date: 11/05/2015 OT Individual Time: 1003-1101 OT Individual Time Calculation (min): 58 min     Short Term Goals: Week 3:  OT Short Term Goal 1 (Week 3): Continue working on [facility] at supervision.    Skilled Therapeutic Interventions/Progress Updates:    Pt performed all functional transfers to the toilet and the shower with min assist using the RW.  Provided LH sponge to assist with reaching B feet for bathing as well as back.  He needed mod instructional cueing to integrate into bathing task.  Min instructional cueing for sequencing with perseveration on washing.  He is able to thread both feet in the brief and his pants but demonstrates LOB posteriorly when attempting to stand and pull them up over his hips.  Min assist to keep from falling backwards.  Pt with report of less dizziness this session but still with some nausea.  He completed oral hygiene and shaving with supervision from wheelchair position.  Educated pt on FM coordination task to be completed, picking up small pieces of foam one at a time and placing into cup.  Encouraged him to progress to picking them up and manipulating them from his fingertips to his palm as well.  Pt left in wheelchair with call button and phone in reach and safety belt in place.   Therapy Documentation Precautions:  Precautions Precautions: Fall Precaution Comments: right hemiparesis Restrictions Weight Bearing Restrictions: No  Pain: Pain Assessment Pain Assessment: No/denies pain ADL: See Function Navigator for Current Functional Status.   Therapy/Group: Individual Therapy  Zacharee Gaddie OTR/L 11/05/2015, 12:27 PM

## 2015-11-05 NOTE — Progress Notes (Signed)
Physical Therapy Note  Patient Details  Name: Gaeton Melgarejo MRN: [id number] Date of Birth: 1927/10/17 Today's Date: 11/05/2015  1345-1445, 60 min individual tx Pain: none reported  Pt in much better spirits.  Older dtr Mariann Laster visiting pt. neuromuscular re-education via demo and cues for bil hip adduction against resistance, R/L long arc quad knee ext, R/L marching, 1- x 1 each, in sitting.  Gait to toilet and sink with RW.  Pt unable to void.  Gait to/from gym with RW, supervision, bumping obstacles on R with RW.  Up/down 12 steps 2 rails, supervision, ascending and descending leading with R foot, without knee buckling.  Simulated 2 steps no rails to enter dtr MaryJean's house.  Pt ascended safely with R hand on door frame, L HHA, descended unsafely due to LOB R requiring max assist to regain.  Pt and Mariann Laster reported that front door has 6 STE with 2 rails. Therapeutic activity in biased to R standing, x 5 minutes during RUE fine motor activity, without LOB.  Pt left resting in w/c with quick release belt applied and all needs within reach.    Neosha Switalski 11/05/2015, 7:57 AM

## 2015-11-05 NOTE — Progress Notes (Signed)
Subjective/Complaints: Pt c/o of some RIght knee pain last noc, no swelling noted, has not fallen   ROS-  +Right hand tingling, heaviness,Denies CP,SOB,    Objective: Vital Signs: Blood pressure (!) 148/60, pulse 91, temperature 99.4 F (37.4 C), temperature source Oral, resp. rate 16, height 5\' 8"  (1.727 m), weight 76.7 kg (169 lb 1.5 oz), SpO2 95 %. No results found. Results for orders placed or performed during the hospital encounter of 10/21/15 (from the past 72 hour(s))  Glucose, capillary     Status: Abnormal   Collection Time: 11/02/15 11:44 AM  Result Value Ref Range   Glucose-Capillary 196 (H) 65 - 99 mg/dL  Glucose, capillary     Status: Abnormal   Collection Time: 11/02/15  4:26 PM  Result Value Ref Range   Glucose-Capillary 225 (H) 65 - 99 mg/dL  Glucose, capillary     Status: Abnormal   Collection Time: 11/03/15  7:19 AM  Result Value Ref Range   Glucose-Capillary 124 (H) 65 - 99 mg/dL   Comment 1 Notify RN   Glucose, capillary     Status: Abnormal   Collection Time: 11/03/15 11:42 AM  Result Value Ref Range   Glucose-Capillary 319 (H) 65 - 99 mg/dL  Glucose, capillary     Status: Abnormal   Collection Time: 11/03/15  4:30 PM  Result Value Ref Range   Glucose-Capillary 195 (H) 65 - 99 mg/dL  Glucose, capillary     Status: Abnormal   Collection Time: 11/03/15  9:45 PM  Result Value Ref Range   Glucose-Capillary 143 (H) 65 - 99 mg/dL  Glucose, capillary     Status: Abnormal   Collection Time: 11/04/15  6:39 AM  Result Value Ref Range   Glucose-Capillary 142 (H) 65 - 99 mg/dL  Glucose, capillary     Status: Abnormal   Collection Time: 11/04/15 11:40 AM  Result Value Ref Range   Glucose-Capillary 207 (H) 65 - 99 mg/dL  Glucose, capillary     Status: Abnormal   Collection Time: 11/04/15  4:39 PM  Result Value Ref Range   Glucose-Capillary 237 (H) 65 - 99 mg/dL  Glucose, capillary     Status: Abnormal   Collection Time: 11/04/15  9:30 PM  Result Value Ref  Range   Glucose-Capillary 187 (H) 65 - 99 mg/dL  Glucose, capillary     Status: Abnormal   Collection Time: 11/05/15  6:54 AM  Result Value Ref Range   Glucose-Capillary 109 (H) 65 - 99 mg/dL     HEENT: normocephalic, atraumatic. More alert  Cardio: RRR and no murmur Resp: CTA B/L and unlabored GI: BS positive and NT, ND Skin:   Intact. Warm and dry. Neuro: drowsy but arousable  Motor 4-/5 R delt, bi, tri, grip. Apraxia. Decreased [place]. (?effort) 4/5 R HF, KE ,4+/5 ADF/PF (?effort) LUE/LLE: 4+/5 proximal to distal Dysarthria  Musc/Skel:  No edema. No erythema.Right knee non tender no effusion Gen: NAD. Vitals signs reviewed.    Assessment/Plan: 1. Functional deficits secondary to Left pontine infarct and new left posterior lateral temporal infarct which require 3+ hours per day of interdisciplinary therapy in a comprehensive inpatient rehab setting. Physiatrist is providing close team supervision and 24 hour management of active medical problems listed below. Physiatrist and rehab team continue to assess barriers to discharge/monitor patient progress toward functional and medical goals. FIM: Function - Bathing Position: Shower Body parts bathed by patient: Right arm, Left arm, Chest, Abdomen, Front perineal area, Buttocks, Right upper leg,  Left upper leg, Left lower leg Body parts bathed by helper: Right lower leg, Back Bathing not applicable: Right upper leg, Left upper leg, Right lower leg, Left lower leg (Did not attempt this session) Assist Level: Supervision or verbal cues  Function- Upper Body Dressing/Undressing What is the patient wearing?: Pull over shirt/dress Pull over shirt/dress - Perfomed by patient: Thread/unthread right sleeve, Thread/unthread left sleeve, Put head through opening, Pull shirt over trunk Pull over shirt/dress - Perfomed by helper: Pull shirt over trunk Assist Level: Supervision or verbal cues Function - Lower Body Dressing/Undressing What is the  patient wearing?: Pants, Maryln Manuel, Shoes Position: Education officer, museum at [employer] - Performed by patient: Thread/unthread left underwear leg, Pull underwear up/down, Thread/unthread right underwear leg Underwear - Performed by helper: Pull underwear up/down, Thread/unthread left underwear leg, Thread/unthread right underwear leg Pants- Performed by patient: Thread/unthread left pants leg, Pull pants up/down, Thread/unthread right pants leg Pants- Performed by helper: Fasten/unfasten pants, Pull pants up/down, Thread/unthread right pants leg, Thread/unthread left pants leg Non-skid slipper socks- Performed by helper: Don/doff right sock, Don/doff left sock Shoes - Performed by patient: Don/doff right shoe, Don/doff left shoe Shoes - Performed by helper: Fasten right, Fasten left TED Hose - Performed by helper: Don/doff right TED hose, Don/doff left TED hose Assist for footwear: Dependant Assist for lower body dressing: Touching or steadying assistance (Pt > 75%)  Function - Toileting Toileting activity did not occur: Refused Toileting steps completed by patient: Adjust clothing prior to toileting, Performs perineal hygiene, Adjust clothing after toileting Toileting steps completed by helper: Adjust clothing prior to toileting, Performs perineal hygiene, Adjust clothing after toileting Toileting Assistive Devices: Grab bar or rail Assist level: Touching or steadying assistance (Pt.75%)  Function - Air cabin crew transfer assistive device: Walker, Grab bar Assist level to toilet: Touching or steadying assistance (Pt > 75%) Assist level from toilet: Touching or steadying assistance (Pt > 75%)  Function - Chair/bed transfer Chair/bed transfer method: Stand pivot Chair/bed transfer assist level: Moderate assist (Pt 50 - 74%/lift or lower) Chair/bed transfer assistive device: Walker, Armrests Chair/bed transfer details: Manual facilitation for weight shifting  Function -  Locomotion: Wheelchair Will patient use wheelchair at discharge?: Yes Type: Manual Max wheelchair distance: 150 Assist Level: Supervision or verbal cues Assist Level: Moderate assistance (Pt 50 - 74%) Assist Level: Supervision or verbal cues Turns around,maneuvers to table,bed, and toilet,negotiates 3% grade,maneuvers on rugs and over doorsills: Yes Function - Locomotion: Ambulation Assistive device: Walker-rolling Max distance: 228ft Assist level: Supervision or verbal cues Assist level: Supervision or verbal cues Walk 50 feet with 2 turns activity did not occur: Safety/medical concerns Assist level: Supervision or verbal cues Walk 150 feet activity did not occur: Safety/medical concerns Assist level: Supervision or verbal cues Walk 10 feet on uneven surfaces activity did not occur: Safety/medical concerns Assist level: Maximal assist (Pt 25 - 49%)  Function - Comprehension Comprehension: Auditory Comprehension assist level: Follows basic conversation/direction with extra time/assistive device  Function - Expression Expression: Verbal Expression assist level: Expresses complex 90% of the time/cues < 10% of the time  Function - Social Interaction Social Interaction assist level: Interacts appropriately 90% of the time - Needs monitoring or encouragement for participation or interaction.  Function - Problem Solving Problem solving assist level: Solves basic 50 - 74% of the time/requires cueing 25 - 49% of the time  Function - Memory Memory assist level: Recognizes or recalls 50 - 74% of the time/requires cueing 25 - 49% of the time  Patient normally able to recall (first 3 days only): Current season  Medical Problem List and Plan:   1.  Right hemiparesis dysarthria/dysphagia secondary to left pontine infarct on 10/14/15 and left posterior lateral temporal infarct 8/27  Cont CIR, PT, OT, SLP- tent D/C 9/13    2.  DVT Prophylaxis/Anticoagulation: Subcutaneous heparin changed to  lovenox. Monitor platelet count and any signs of bleeding 3. Pain Management: Tylenol as needed, R knee pain , hx gout but exam normal will monitor 4. Hypertension.   Lopressor discontinued due to bradycardia.  Increased hydralazine 50 mg every 6 hours on 8/26, increased to 100 on 8/27  Norvasc 5 started 8/26   Will not make further changes at present and allow for permissive HTN, improving, goal SBP 130-150, adding flomax this may lower BP Vitals:   11/05/15 0032 11/05/15 0509  BP: (!) 165/66 (!) 148/60  Pulse: 85 91  Resp:  16  Temp:  99.4 F (37.4 C)   5. Sinus arrest due to vagal spell. Beta blocker has been discontinued for now. No additional episodes. Follow-up per cardiology services. No plan at this time for pacemaker. 6. Neuropsych: This patient is not fully capable of making decisions on his own behalf./  Started on Zoloft 8/27, changed to Prozaac on 8/28, d/ced 8/31 due to ?nausea 7. Skin/Wound Care: Routine skin check 8. Fluids/Electrolytes/Nutrition: Routine I&O's  9. Chronic renal insufficiency. Baseline creatinine 1.5.   Cr. At baseline  Will cont to monitor   10. Diet-controlled diabetes mellitus. Hemoglobin A1c 7.3. Check blood sugars before meals and at bedtime. Diabetic teaching.   Now off prednisone   -diet changed to CM medium  -on amaryl, am CBG at goal, PM CBGelevated, increase amaryl CBG (last 3)   Recent Labs  11/04/15 1639 11/04/15 2130 11/05/15 0654  GLUCAP 237* 187* 109*  11. CAD/SVT. No chest pain or shortness of breath.  12. Gout.attack resolved  monitor for reoccurance 13. Hyperlipidemia. Pravachol 14. ?Hematuria: resolved, question cath related 16. Urinary retention- starting to void  Confirmed with PVRs  started bethanechol 10 TID,  -cont flomax  Family concerned this may be causing "chills"  17. Nausea- improved 18. UTI  - UA neg except +WBCs, few bact, enterococcus >100K,   course of amox given cath hx   LOS (Days) 15 A FACE TO  FACE EVALUATION WAS PERFORMED  Naima Veldhuizen E 11/05/2015, 7:22 AM

## 2015-11-05 NOTE — Progress Notes (Signed)
Per Silvestre Mesi, PA discontinue Flomax at this time and assess bladder without medication. Family has concerns about medication causing dizziness. Will continue to monitor.

## 2015-11-05 NOTE — Progress Notes (Signed)
Patient A/O, no noted distress/pain. Educated daughter and patient on medication and follow up care. Patient ask about information on [place] benefits, he and daughter was educated on the [facility] and various service available. Patient tolerated meds will in apple sauce whole without having difficulties, patient was positioned at 90 degree. Staff will continue to monitor and meet needs.

## 2015-11-05 NOTE — Progress Notes (Addendum)
Speech Language Pathology Weekly Progress and Session Note  Patient Details  Name: Tedrick Port MRN: [id number] Date of Birth: 1927/07/09  Beginning of progress report period: October 29, 2015  End of progress report period:  November 05, 2015   Today's Date: 11/05/2015 SLP Individual Time: 9233-0076 SLP Individual Time Calculation (min): 41 min   Short Term Goals: Week 2: SLP Short Term Goal 1 (Week 2): Pt will demonstrate intellectual awareness within functional tasks with Min A cues.  SLP Short Term Goal 1 - Progress (Week 2): Met SLP Short Term Goal 2 (Week 2): Pt will utilize speech intelligibility strategies at the simple conversation level with supervision cues. to achieve greater than 90% intelligibility over 3 targeted sessions SLP Short Term Goal 2 - Progress (Week 2): Met SLP Short Term Goal 3 (Week 2): Pt will demonstrate functional problem solving for mildly complex tasks with Min A verbal cues.  SLP Short Term Goal 3 - Progress (Week 2): Met SLP Short Term Goal 4 (Week 2): Pt will demonstrate sustained attention to task for 5 minutes with Mod assist verbal cues for redirection SLP Short Term Goal 4 - Progress (Week 2): Met    New Short Term Goals: Week 3: SLP Short Term Goal 1 (Week 3): Pt will recognize and correct errors in the moment during basic functional tasks with min assist verbal cues.  SLP Short Term Goal 2 (Week 3): Pt will utilize speech intelligibility strategies at the simple conversation level with mod I over 3 targeted sessions SLP Short Term Goal 3 (Week 3): Pt will demonstrate functional problem solving for mildly complex tasks with supervision verbal cues.  SLP Short Term Goal 4 (Week 3): Pt will demonstrate selective attention to task in a mildly distracting environment for 15 minutes with Min assist verbal cues for redirection  Weekly Progress Updates: Pt made functional gains this reporting period and has met 4 out of 4 short term goals.  Pt has  demonstrated improved attention to tasks, functional problem solving, and awareness of his deficits.  Pt remains on a dys 3 diet with thin liquids due to history of chronic esophageal dysfunction.  Per pt and family's report pt is at baseline for swallowing function but would benefit from additional family education prior to discharge regarding dys 3 textures to assist family in making appropriate meal choices at home.  Pt has also demonstrated improved speech intelligibility in conversations, suspect function impacted by improved endurance and better management of persistent nausea s/p CVA.  Pt would continue to benefit from skilled ST while inpatient in order to maximize functional independence and reduce burden of care prior to discharge home.  Discharge recommendations TBD pending progress made while inpatient.     Intensity: Minumum of 1-2 x/day, 30 to 90 minutes Frequency: 3 to 5 out of 7 days Duration/Length of Stay: 14 days Treatment/Interventions: Cognitive remediation/compensation;Cueing hierarchy;Dysphagia/aspiration precaution training;Internal/external aids;Functional tasks;Patient/family education;Therapeutic Exercise;Therapeutic Activities;Oral motor exercises;Speech/Language facilitation   Daily Session  Skilled Therapeutic Interventions: Pt was seen for skilled ST targeting goals for dysphagia and speech intelligibility.  Therapist facilitated the session with a functional snack of graham crackers, peanut butter, and ginger ale to monitor toleration of solids.  Pt with intermittent facial grimace and reports of globus sensation with solids which he reports to be consistent with baseline swallowing function.  No overt s/s of aspiration with solids or liquids.  Per report, pt's daughter wants to bring him a sausage, egg, and cheese biscuit from home tomorrow for breakfast.  SLP will plan to attend breakfast meal with daughter present tomorrow to complete dysphagia education regarding  recommended diet textures given prolonged history of esophageal dysfunction.  Pt was intelligible during functional conversations with SLP with supervision verbal cues for repetition to clarify, even in the presence of mild environmental distractions and background noise.  Pt was left in wheelchair with daughter for transport back to room.  Goals updated on this date to reflect current progress and plan of care.        Function:   Eating Eating   Modified Consistency Diet: Yes Eating Assist Level: More than reasonable amount of time           Cognition Comprehension Comprehension assist level: Follows basic conversation/direction with extra time/assistive device  Expression   Expression assist level: Expresses basic 90% of the time/requires cueing < 10% of the time.  Social Interaction Social Interaction assist level: Interacts appropriately 90% of the time - Needs monitoring or encouragement for participation or interaction.  Problem Solving Problem solving assist level: Solves basic 75 - 89% of the time/requires cueing 10 - 24% of the time  Memory Memory assist level: Recognizes or recalls 75 - 89% of the time/requires cueing 10 - 24% of the time   General    Pain Pain Assessment Pain Assessment: No/denies pain  Therapy/Group: Individual Therapy  Isamu Trammel, Selinda Orion 11/05/2015, 4:08 PM

## 2015-11-06 ENCOUNTER — Inpatient Hospital Stay (HOSPITAL_COMMUNITY): Payer: Medicare Other | Admitting: Occupational Therapy

## 2015-11-06 ENCOUNTER — Inpatient Hospital Stay (HOSPITAL_COMMUNITY): Payer: Medicare Other | Admitting: Speech Pathology

## 2015-11-06 ENCOUNTER — Inpatient Hospital Stay (HOSPITAL_COMMUNITY): Payer: Medicare Other

## 2015-11-06 ENCOUNTER — Inpatient Hospital Stay (HOSPITAL_COMMUNITY): Payer: Medicare Other | Admitting: *Deleted

## 2015-11-06 DIAGNOSIS — M79643 Pain in unspecified hand: Secondary | ICD-10-CM

## 2015-11-06 DIAGNOSIS — M1990 Unspecified osteoarthritis, unspecified site: Secondary | ICD-10-CM

## 2015-11-06 LAB — GLUCOSE, CAPILLARY
GLUCOSE-CAPILLARY: 147 mg/dL — AB (ref 65–99)
GLUCOSE-CAPILLARY: 146 mg/dL — AB (ref 65–99)
Glucose-Capillary: 108 mg/dL — ABNORMAL HIGH (ref 65–99)
Glucose-Capillary: 94 mg/dL (ref 65–99)
Glucose-Capillary: 159 mg/dL — ABNORMAL HIGH (ref 65–99)

## 2015-11-06 LAB — BASIC METABOLIC PANEL
ANION GAP: 10 (ref 5–15)
BUN: 25 mg/dL — ABNORMAL HIGH (ref 6–20)
CALCIUM: 9.4 mg/dL (ref 8.9–10.3)
CHLORIDE: 100 mmol/L — AB (ref 101–111)
CO2: 23 mmol/L (ref 22–32)
CREATININE: 1.45 mg/dL — AB (ref 0.61–1.24)
GFR calc Af Amer: 49 mL/min — ABNORMAL LOW (ref >60)
GFR, EST NON AFRICAN AMERICAN: 42 mL/min — AB (ref >60)
Glucose, Bld: 161 mg/dL — ABNORMAL HIGH (ref 65–99)
POTASSIUM: 4 mmol/L (ref 3.5–5.1)
Sodium: 133 mmol/L — ABNORMAL LOW (ref 135–145)

## 2015-11-06 MED ORDER — DICLOFENAC SODIUM 1 % TD GEL
2.0000 g | Freq: Four times a day (QID) | TRANSDERMAL | Status: DC
  Administered 2015-11-06 – 2015-11-11 (×21): 2 g via TOPICAL
  Filled 2015-11-06: qty 100

## 2015-11-06 NOTE — Progress Notes (Signed)
Subjective/Complaints: No knee pain, some soreness in the righ thand Discussed ortho BPs with RN, pt with 52mmhg drop sit to stand but no dizziness Had some nausea that resolved with BM this am  Voiding well , off flomax  ROS-  +Right hand tingling, heaviness,Denies CP,SOB,    Objective: Vital Signs: Blood pressure (!) 155/58, pulse 91, temperature 98.1 F (36.7 C), temperature source Oral, resp. rate 18, height 5\' 8"  (1.727 m), weight 76.7 kg (169 lb 1.5 oz), SpO2 95 %. No results found. Results for orders placed or performed during the hospital encounter of 10/21/15 (from the past 72 hour(s))  Glucose, capillary     Status: Abnormal   Collection Time: 11/03/15 11:42 AM  Result Value Ref Range   Glucose-Capillary 319 (H) 65 - 99 mg/dL  Glucose, capillary     Status: Abnormal   Collection Time: 11/03/15  4:30 PM  Result Value Ref Range   Glucose-Capillary 195 (H) 65 - 99 mg/dL  Glucose, capillary     Status: Abnormal   Collection Time: 11/03/15  9:45 PM  Result Value Ref Range   Glucose-Capillary 143 (H) 65 - 99 mg/dL  Glucose, capillary     Status: Abnormal   Collection Time: 11/04/15  6:39 AM  Result Value Ref Range   Glucose-Capillary 142 (H) 65 - 99 mg/dL  Glucose, capillary     Status: Abnormal   Collection Time: 11/04/15 11:40 AM  Result Value Ref Range   Glucose-Capillary 207 (H) 65 - 99 mg/dL  Glucose, capillary     Status: Abnormal   Collection Time: 11/04/15  4:39 PM  Result Value Ref Range   Glucose-Capillary 237 (H) 65 - 99 mg/dL  Glucose, capillary     Status: Abnormal   Collection Time: 11/04/15  9:30 PM  Result Value Ref Range   Glucose-Capillary 187 (H) 65 - 99 mg/dL  Glucose, capillary     Status: Abnormal   Collection Time: 11/05/15  6:54 AM  Result Value Ref Range   Glucose-Capillary 109 (H) 65 - 99 mg/dL  Glucose, capillary     Status: Abnormal   Collection Time: 11/05/15 11:36 AM  Result Value Ref Range   Glucose-Capillary 130 (H) 65 - 99  mg/dL  Glucose, capillary     Status: Abnormal   Collection Time: 11/05/15  4:44 PM  Result Value Ref Range   Glucose-Capillary 210 (H) 65 - 99 mg/dL     HEENT: normocephalic, atraumatic. More alert  Cardio: RRR and no murmur Resp: CTA B/L and unlabored GI: BS positive and NT, ND Skin:   Intact. Warm and dry. Neuro: drowsy but arousable  Motor 4-/5 R delt, bi, tri, grip. Apraxia. Decreased Hitchcock. (?effort) 4/5 R HF, KE ,4+/5 ADF/PF (?effort) LUE/LLE: 4+/5 proximal to distal Dysarthria  Musc/Skel:  No edema. No erythema.Right knee non tender no effusion Gen: NAD. Vitals signs reviewed.    Assessment/Plan: 1. Functional deficits secondary to Left pontine infarct and new left posterior lateral temporal infarct which require 3+ hours per day of interdisciplinary therapy in a comprehensive inpatient rehab setting. Physiatrist is providing close team supervision and 24 hour management of active medical problems listed below. Physiatrist and rehab team continue to assess barriers to discharge/monitor patient progress toward functional and medical goals. FIM: Function - Bathing Position: Shower Body parts bathed by patient: Right arm, Left arm, Chest, Abdomen, Front perineal area, Buttocks, Right upper leg, Left upper leg, Left lower leg, Right lower leg Body parts bathed by helper: Right  lower leg, Back Bathing not applicable: Back Assist Level: Supervision or verbal cues  Function- Upper Body Dressing/Undressing What is the patient wearing?: Pull over shirt/dress Pull over shirt/dress - Perfomed by patient: Thread/unthread right sleeve, Thread/unthread left sleeve, Put head through opening, Pull shirt over trunk Pull over shirt/dress - Perfomed by helper: Pull shirt over trunk Assist Level: Supervision or verbal cues Function - Lower Body Dressing/Undressing What is the patient wearing?: Pants, Shoes, [employer], Non-skid slipper socks Position: Wheelchair/chair at [employer] -  Performed by patient: Thread/unthread left underwear leg, Pull underwear up/down, Thread/unthread right underwear leg Underwear - Performed by helper: Pull underwear up/down, Thread/unthread left underwear leg, Thread/unthread right underwear leg Pants- Performed by patient: Thread/unthread right pants leg, Thread/unthread left pants leg, Pull pants up/down Pants- Performed by helper: Fasten/unfasten pants, Pull pants up/down, Thread/unthread right pants leg, Thread/unthread left pants leg Non-skid slipper socks- Performed by helper: Don/doff right sock, Don/doff left sock Shoes - Performed by patient: Don/doff right shoe, Don/doff left shoe Shoes - Performed by helper: Fasten right, Fasten left TED Hose - Performed by helper: Don/doff right TED hose, Don/doff left TED hose Assist for footwear: Dependant Assist for lower body dressing: Touching or steadying assistance (Pt > 75%)  Function - Toileting Toileting activity did not occur: Refused Toileting steps completed by patient: Adjust clothing prior to toileting, Performs perineal hygiene, Adjust clothing after toileting Toileting steps completed by helper: Adjust clothing prior to toileting, Performs perineal hygiene, Adjust clothing after toileting Toileting Assistive Devices: Grab bar or rail Assist level: Touching or steadying assistance (Pt.75%)  Function - Toilet Transfers Toilet transfer assistive device: Elevated toilet seat/BSC over toilet Assist level to toilet: Touching or steadying assistance (Pt > 75%) Assist level from toilet: Touching or steadying assistance (Pt > 75%)  Function - Chair/bed transfer Chair/bed transfer method: Stand pivot Chair/bed transfer assist level: Supervision or verbal cues Chair/bed transfer assistive device: Walker, Armrests Chair/bed transfer details: Verbal cues for precautions/safety, Verbal cues for safe use of DME/AE  Function - Locomotion: Wheelchair Will patient use wheelchair at  discharge?: Yes Type: Manual Max wheelchair distance: 150 Assist Level: Supervision or verbal cues Assist Level: Moderate assistance (Pt 50 - 74%) Assist Level: Supervision or verbal cues Turns around,maneuvers to table,bed, and toilet,negotiates 3% grade,maneuvers on rugs and over doorsills: Yes Function - Locomotion: Ambulation Assistive device: Walker-rolling Max distance: 150 Assist level: Supervision or verbal cues Assist level: Supervision or verbal cues Walk 50 feet with 2 turns activity did not occur: Safety/medical concerns Assist level: Supervision or verbal cues Walk 150 feet activity did not occur: Safety/medical concerns Assist level: Supervision or verbal cues Walk 10 feet on uneven surfaces activity did not occur: Safety/medical concerns Assist level: Supervision or verbal cues  Function - Comprehension Comprehension: Auditory Comprehension assist level: Follows basic conversation/direction with extra time/assistive device  Function - Expression Expression: Verbal Expression assist level: Expresses basic 90% of the time/requires cueing < 10% of the time.  Function - Social Interaction Social Interaction assist level: Interacts appropriately 90% of the time - Needs monitoring or encouragement for participation or interaction.  Function - Problem Solving Problem solving assist level: Solves basic 75 - 89% of the time/requires cueing 10 - 24% of the time  Function - Memory Memory assist level: Recognizes or recalls 75 - 89% of the time/requires cueing 10 - 24% of the time Patient normally able to recall (first 3 days only): Current season  Medical Problem List and Plan:   1.  Right  hemiparesis dysarthria/dysphagia secondary to left pontine infarct on 10/14/15 and left posterior lateral temporal infarct 8/27  Cont CIR, PT, OT, SLP- tent D/C 9/13    2.  DVT Prophylaxis/Anticoagulation: Subcutaneous heparin changed to lovenox. Monitor platelet count and any signs of  bleeding 3. Pain Management: Tylenol as needed, R knee pain , resolved, some swelling R 4th MCP, will rx volt gel 4. Hypertension.   Lopressor discontinued due to bradycardia.  Increased hydralazine 50 mg every 6 hours on 8/26, increased to 100 on 8/27  Norvasc 5 started 8/26   Will not make further changes at present and allow for permissive HTN, improving, goal SBP 130-150, adding flomax this may lower BP Vitals:   11/05/15 2304 11/06/15 0603  BP: (!) 135/55 (!) 155/58  Pulse:  91  Resp:  18  Temp:  98.1 F (36.7 C)   5. Sinus arrest due to vagal spell. Beta blocker has been discontinued for now. No additional episodes. Follow-up per cardiology services. No plan at this time for pacemaker. 6. Neuropsych: This patient is not fully capable of making decisions on his own behalf./  Started on Zoloft 8/27, changed to Prozaac on 8/28, d/ced 8/31 due to ?nausea 7. Skin/Wound Care: Routine skin check 8. Fluids/Electrolytes/Nutrition: Routine I&O's  9. Chronic renal insufficiency. Baseline creatinine 1.5.   Cr. At baseline  Will cont to monitor   10. Diet-controlled diabetes mellitus. Hemoglobin A1c 7.3. Check blood sugars before meals and at bedtime. Diabetic teaching.   Now off prednisone   -diet changed to CM medium  -on amaryl, am CBG at goal, PM CBGelevated, increase amaryl CBG (last 3)   Recent Labs  11/05/15 0654 11/05/15 1136 11/05/15 1644  GLUCAP 109* 130* 210*  11. CAD/SVT. No chest pain or shortness of breath.  12. Gout.attack resolved  monitor for reoccurance 13. Hyperlipidemia. Pravachol 14. ?Hematuria: resolved, question cath related 16. Urinary retention- starting to void  Confirmed with PVRs  started bethanechol 10 TID,  -off flomax, still voiding  17. Nausea- improved 18. UTI  - UA neg except +WBCs, few bact, enterococcus >100K,  Amp day 2/7   LOS (Days) 16 A FACE TO FACE EVALUATION WAS PERFORMED  Mariajose Mow E 11/06/2015, 7:23 AM

## 2015-11-06 NOTE — Progress Notes (Signed)
Occupational Therapy Session Note  Patient Details  Name: [name] MRN: [id number] Date of Birth: 09/28/1927  Today's Date: 11/06/2015 OT Individual Time: 1003-1100 OT Individual Time Calculation (min): 57 min     Short Term Goals: Week 3:  OT Short Term Goal 1 (Week 3): Continue working on [facility] at supervision.    Skilled Therapeutic Interventions/Progress Updates:    Pt ambulated to the toilet with the RW and min guard assist for toileting.  He was able to complete toileting tasks with min assist sit to stand with transition to the walk-in shower for bathing.  Min instructional cueing for sequencing bathing with use of LH sponge for washing feet.   Pt continues to demonstrate difficulty with selective attention and sequencing of bathing and drying off.  Dressing with overall min assist sit to stand.  Max assist needed for tying shoes.  Pt still with reports of not "feeling well" and some dizziness, mostly with transfers and positional changes.  Pt left in wheelchair with call button and phone in reach.  Safety belt in place.   Therapy Documentation Precautions:  Precautions Precautions: Fall Precaution Comments: right hemiparesis Restrictions Weight Bearing Restrictions: No  Pain: Pain Assessment Pain Assessment: Faces Faces Pain Scale: Hurts a little bit Pain Type: Acute pain Pain Location: Foot Pain Orientation: Right Pain Descriptors / Indicators: Discomfort Pain Intervention(s): Medication (See eMAR);Repositioned ADL: See Function Navigator for Current Functional Status.   Therapy/Group: Individual Therapy  Shavell Nored OTR/L 11/06/2015, 11:04 AM

## 2015-11-06 NOTE — Progress Notes (Signed)
Occupational Therapy Session Note  Late entry for 11/05/15  Patient Details  Name: [name] MRN: [id number] Date of Birth: 1927-08-30  Today's Date: 11/06/2015 OT Individual Time:15:02-15:30 (28 mins)       Short Term Goals: Week 3:  OT Short Term Goal 1 (Week 3): Continue working on [facility] at supervision.    Skilled Therapeutic Interventions/Progress Updates:    Pt transferred from wheelchair to therapy mat with min assist stand pivot.  Focus of session on RUE functional use with reach and FM coordination.  Had pt work on picking up pegs with the RUE, one at a time and place in vertical grid.  Increased shoulder hike noted with attempted functional use.  Pt also needing to use his leg or abdomen to help reposition the pegs in his hands after picking them up, as he could not manipulate them in hand.  Had him also work on removing pegs one at a time to place them back into the container.  Pt with approximately 95% accuracy with picking up and placing pegs one at a time.  Returned to wheelchair and back to room at completion of session.  Pt left sitting in wheelchair with safety belt in place per pt request.  Call button in reach.   Therapy Documentation Precautions:  Precautions Precautions: Fall Precaution Comments: right hemiparesis Restrictions Weight Bearing Restrictions: No  Pain: Pain Assessment Pain Assessment: No/denies pain ADL: See Function Navigator for Current Functional Status.   Therapy/Group: Individual Therapy  Leiland Mihelich OTR/L 11/06/2015, 5:35 PM

## 2015-11-06 NOTE — Progress Notes (Signed)
Physical Therapy Session Note  Patient Details  Name: Sabastien Nienow MRN: [id number] Date of Birth: 1927-10-29  Today's Date: 11/06/2015 PT Individual Time: 1110-1155 PT Individual Time Calculation (min): 45 min    Skilled Therapeutic Interventions/Progress Updates:    Session focused on w/c propulsion with BLE only (to address strengthening and coordination) at mod I level, stair negotiation training for community mobility and generalized strengthening at supervision level with rails, simulated car transfer to prepare for discharge at supervision level with cues for hand placement, and gait in controlled environment as well as up/down ramp and over mulched surface to simulate community mobility. Pt required overall supervision with verbal cues for attention to R, hand placement, and safe positioning of RW at times in tight spaces. Pt tangential during session requiring min cues for redirection to task. End of session set up with all needs in reach and safety belt donned.   Therapy Documentation Precautions:  Precautions Precautions: Fall Precaution Comments: right hemiparesis Restrictions Weight Bearing Restrictions: No  Pain: C/o R hand pain from gout. Also reports just not feeling well today. Premedicated.   See Function Navigator for Current Functional Status.   Therapy/Group: Individual Therapy  [name], PT, DPT  11/06/2015, 12:12 PM

## 2015-11-06 NOTE — Progress Notes (Signed)
Speech Language Pathology Daily Session Note  Patient Details  Name: [name] MRN: [id number] Date of Birth: 30-Nov-1927  Today's Date: 11/06/2015 SLP Individual Time: 0800-0900 SLP Individual Time Calculation (min): 60 min   Short Term Goals: Week 3: SLP Short Term Goal 1 (Week 3): Pt will recognize and correct errors in the moment during basic functional tasks with min assist verbal cues.  SLP Short Term Goal 2 (Week 3): Pt will utilize speech intelligibility strategies at the simple conversation level with mod I over 3 targeted sessions SLP Short Term Goal 3 (Week 3): Pt will demonstrate functional problem solving for mildly complex tasks with supervision verbal cues.  SLP Short Term Goal 4 (Week 3): Pt will demonstrate selective attention to task in a mildly distracting environment for 15 minutes with Min assist verbal cues for redirection  Skilled Therapeutic Interventions:Pt seen for skilled SLP treatment focusing on dysphagia and cognition. Pt able to self-manage PO with implementation of alternating solids/liquids. Pt able to verbalize improved safety awareness although there is persistent lack of depth of understanding. Pt verbalized being concerned about being a burden. Discussed increasing safety as a way of easing the burden of care. Mod A required for recall of novel information.   Function:  Eating Eating   Modified Consistency Diet: Yes Eating Assist Level: More than reasonable amount of time   Eating Set Up Assist For: Opening containers       Cognition Comprehension Comprehension assist level: Follows basic conversation/direction with extra time/assistive device  Expression   Expression assist level: Expresses basic 90% of the time/requires cueing < 10% of the time.  Social Interaction Social Interaction assist level: Interacts appropriately with others with medication or extra time (anti-anxiety, antidepressant).  Problem Solving Problem solving assist level: Solves  basic 90% of the time/requires cueing < 10% of the time  Memory Memory assist level: Recognizes or recalls 75 - 89% of the time/requires cueing 10 - 24% of the time    Pain Pain Assessment Pain Assessment: No/denies pain  Therapy/Group: Individual Therapy  Vinetta Bergamo MA, CCC-SLP 11/06/2015, 8:59 AM

## 2015-11-06 NOTE — Progress Notes (Signed)
Physical Therapy Session Note  Patient Details  Name: [name] MRN: [id number] Date of Birth: Sep 28, 1927  Today's Date: 11/06/2015 PT Individual Time: 1352-1434 PT Individual Time Calculation (min): 42 min    Short Term Goals:Week 2:  PT Short Term Goal 1 (Week 2): pt will transfer with min assist consistently PT Short Term Goal 1 - Progress (Week 2): Met PT Short Term Goal 2 (Week 2): pt will propel w/c x 150' with supervision PT Short Term Goal 2 - Progress (Week 2): Met PT Short Term Goal 3 (Week 2): pt will perform gait x 150' with LRAD and min assist PT Short Term Goal 3 - Progress (Week 2): Met PT Short Term Goal 4 (Week 2): pt will ascend and descend 12 steps with min assist PT Short Term Goal 4 - Progress (Week 2): Met Week 3:   STG=LTG due to LOS  Skilled Therapeutic Interventions/Progress Updates:  Tx focused on functional mobility training, gait with RW, and therex for strengthening and activity tolerance. Pt resting in bed, agreeable to bedside tx, but reports not feeling well and hasn't urinated all day. Pt encourage to intake more fluid. Attempted toileting, but unsuccessful.   Therex in supine for strengthening: ankle pumps, heel slides 2x10 bil each. Seated ex: marching and LAQ with 5 sec holds 2x10 bil.  Supine>sit with Min A for trunk lifting and cues for sequence. Sit>supine with S.  Pt sat EOB x5 min during functional task with close S.   Sit<>stands with close S and safety cues.Pt able to stand unsupported x12mn, bu tlimited by fatigue. Gait in/around room and in hall x150 with RW and close S. Pt challenged with no device x25' with Min A for weight shifting.  Pt left up in bed to rest with all needs in reach.       Therapy Documentation Precautions:  Precautions Precautions: Fall Precaution Comments: right hemiparesis Restrictions Weight Bearing Restrictions: No   Pain: none  See Function Navigator for Current Functional Status.   Therapy/Group:  Individual Therapy  Astaria Nanez MSoundra Pilon PT, DPT  11/06/2015, 2:21 PM

## 2015-11-07 ENCOUNTER — Inpatient Hospital Stay (HOSPITAL_COMMUNITY): Payer: Medicare Other

## 2015-11-07 DIAGNOSIS — M25521 Pain in right elbow: Secondary | ICD-10-CM

## 2015-11-07 DIAGNOSIS — N390 Urinary tract infection, site not specified: Secondary | ICD-10-CM

## 2015-11-07 LAB — GLUCOSE, CAPILLARY
GLUCOSE-CAPILLARY: 112 mg/dL — AB (ref 65–99)
GLUCOSE-CAPILLARY: 101 mg/dL — AB (ref 65–99)
Glucose-Capillary: 97 mg/dL (ref 65–99)

## 2015-11-07 NOTE — Progress Notes (Signed)
Occupational Therapy Session Note  Patient Details  Name: [name] MRN: [id number] Date of Birth: 01/19/1928  Today's Date: 11/07/2015 OT Individual Time: 1000-1030 OT Individual Time Calculation (min): 30 min    Short Term Goals: Week 3:  OT Short Term Goal 1 (Week 3): Continue working on [facility] at supervision.    Skilled Therapeutic Interventions/Progress Updates:   ADL-retraining with focus on toileting, grooming, dynamic standing balance.   Pt received seated in w/c and requesting assist to bathroom.   With setup to provide RW and contact guard assist, pt ambulated to bathroom and completed transfer to toilet with supervision for safety.   With extra time, pt managed his clothing,  voided urine and performed hygiene with SBA.  Pt then ambulated to sink and groomed, standing, washing his hands and brushing his teeth with setup to provide supplies and supervision to problem-solve.    Pt returned to his w/c and remained seated with all needs placed within reach.  Therapy Documentation Precautions:  Precautions Precautions: Fall Precaution Comments: right hemiparesis Restrictions Weight Bearing Restrictions: No  Pain: No/Denies pain    See Function Navigator for Current Functional Status.   Therapy/Group: Individual Therapy  [place] 11/07/2015, 12:29 PM

## 2015-11-07 NOTE — Progress Notes (Signed)
Subjective/Complaints: Pt laying in bed this AM.  He states he didn't sleep well because his elbow was hurting.  He thinks gout "may be coming on".   ROS-  Denies CP,SOB, N/V/D.  Objective: Vital Signs: Blood pressure (!) 149/70, pulse 70, temperature 97.8 F (36.6 C), temperature source Oral, resp. rate 18, height _0  (1.727 m), weight 76 kg (167 lb 8.8 oz), SpO2 98 %. No results found. Results for orders placed or performed during the hospital encounter of 10/21/15 (from the past 72 hour(s))  Glucose, capillary     Status: Abnormal   Collection Time: 11/04/15  4:39 PM  Result Value Ref Range   Glucose-Capillary 237 (H) 65 - 99 mg/dL  Glucose, capillary     Status: Abnormal   Collection Time: 11/04/15  9:30 PM  Result Value Ref Range   Glucose-Capillary 187 (H) 65 - 99 mg/dL  Glucose, capillary     Status: Abnormal   Collection Time: 11/05/15  6:54 AM  Result Value Ref Range   Glucose-Capillary 109 (H) 65 - 99 mg/dL  Glucose, capillary     Status: Abnormal   Collection Time: 11/05/15 11:36 AM  Result Value Ref Range   Glucose-Capillary 130 (H) 65 - 99 mg/dL  Glucose, capillary     Status: Abnormal   Collection Time: 11/05/15  4:44 PM  Result Value Ref Range   Glucose-Capillary 210 (H) 65 - 99 mg/dL  Glucose, capillary     Status: Abnormal   Collection Time: 11/05/15  8:21 PM  Result Value Ref Range   Glucose-Capillary 108 (H) 65 - 99 mg/dL  Glucose, capillary     Status: Abnormal   Collection Time: 11/06/15  6:56 AM  Result Value Ref Range   Glucose-Capillary 147 (H) 65 - 99 mg/dL  Basic metabolic panel     Status: Abnormal   Collection Time: 11/06/15  7:53 AM  Result Value Ref Range   Sodium 133 (L) 135 - 145 mmol/L   Potassium 4.0 3.5 - 5.1 mmol/L   Chloride 100 (L) 101 - 111 mmol/L   CO2 23 22 - 32 mmol/L   Glucose, Bld 161 (H) 65 - 99 mg/dL   BUN 25 (H) 6 - 20 mg/dL   Creatinine, Ser 1.45 (H) 0.61 - 1.24 mg/dL   Calcium 9.4 8.9 - 10.3 mg/dL   GFR calc non  Af Amer 42 (L) >60 mL/min   GFR calc Af Amer 49 (L) >60 mL/min    Comment: (NOTE) The eGFR has been calculated using the CKD EPI equation. This calculation has not been validated in all clinical situations. eGFR's persistently <60 mL/min signify possible Chronic Kidney Disease.    Anion gap 10 5 - 15  Glucose, capillary     Status: Abnormal   Collection Time: 11/06/15 12:26 PM  Result Value Ref Range   Glucose-Capillary 146 (H) 65 - 99 mg/dL  Glucose, capillary     Status: None   Collection Time: 11/06/15  4:59 PM  Result Value Ref Range   Glucose-Capillary 94 65 - 99 mg/dL  Glucose, capillary     Status: Abnormal   Collection Time: 11/06/15  8:52 PM  Result Value Ref Range   Glucose-Capillary 159 (H) 65 - 99 mg/dL  Glucose, capillary     Status: None   Collection Time: 11/07/15  6:58 AM  Result Value Ref Range   Glucose-Capillary 97 65 - 99 mg/dL  Glucose, capillary     Status: Abnormal   Collection Time:  11/07/15 11:42 AM  Result Value Ref Range   Glucose-Capillary 112 (H) 65 - 99 mg/dL     HEENT: normocephalic, atraumatic.  Cardio: RRR and no murmur Resp: CTA B/L and unlabored GI: BS positive and NT, ND Skin:   Intact. Warm and dry. Neuro: Alert. Right facial weakness.  Motor 4-/5 R delt, bi, tri, grip. Apraxia. Decreased [place]. (?effort) 4/5 R HF, KE ,4+/5 ADF/PF (?effort) LUE/LLE: 4+/5 proximal to distal Dysarthria Musc/Skel:  No edema. No erythema. Right knee non tender no effusion, right elbow mild TTP, no edema, no erythema Gen: NAD. Vitals signs reviewed.    Assessment/Plan: 1. Functional deficits secondary to Left pontine infarct and new left posterior lateral temporal infarct which require 3+ hours per day of interdisciplinary therapy in a comprehensive inpatient rehab setting. Physiatrist is providing close team supervision and 24 hour management of active medical problems listed below. Physiatrist and rehab team continue to assess barriers to  discharge/monitor patient progress toward functional and medical goals. FIM: Function - Bathing Position: Shower Body parts bathed by patient: Right arm, Left arm, Chest, Abdomen, Front perineal area, Buttocks, Right upper leg, Left upper leg, Left lower leg, Right lower leg, Back Body parts bathed by helper: Right lower leg, Back Bathing not applicable: Back Assist Level: Supervision or verbal cues  Function- Upper Body Dressing/Undressing What is the patient wearing?: Pull over shirt/dress Pull over shirt/dress - Perfomed by patient: Thread/unthread right sleeve, Thread/unthread left sleeve, Put head through opening, Pull shirt over trunk Pull over shirt/dress - Perfomed by helper: Pull shirt over trunk Assist Level: Set up Function - Lower Body Dressing/Undressing What is the patient wearing?: Pants, Shoes, [employer], Non-skid slipper socks Position: Wheelchair/chair at [employer] - Performed by patient: Thread/unthread left underwear leg, Pull underwear up/down, Thread/unthread right underwear leg Underwear - Performed by helper: Pull underwear up/down, Thread/unthread left underwear leg, Thread/unthread right underwear leg Pants- Performed by patient: Thread/unthread right pants leg, Thread/unthread left pants leg, Pull pants up/down Pants- Performed by helper: Fasten/unfasten pants, Pull pants up/down, Thread/unthread right pants leg, Thread/unthread left pants leg Non-skid slipper socks- Performed by helper: Don/doff right sock, Don/doff left sock Shoes - Performed by patient: Don/doff right shoe, Don/doff left shoe Shoes - Performed by helper: Fasten right, Fasten left TED Hose - Performed by helper: Don/doff right TED hose, Don/doff left TED hose Assist for footwear: Dependant Assist for lower body dressing: Touching or steadying assistance (Pt > 75%)  Function - Toileting Toileting activity did not occur: Refused Toileting steps completed by patient: Adjust clothing prior  to toileting, Performs perineal hygiene, Adjust clothing after toileting Toileting steps completed by helper: Adjust clothing prior to toileting, Performs perineal hygiene, Adjust clothing after toileting Toileting Assistive Devices: Grab bar or rail Assist level: Touching or steadying assistance (Pt.75%)  Function - Toilet Transfers Toilet transfer assistive device: Elevated toilet seat/BSC over toilet Assist level to toilet: Touching or steadying assistance (Pt > 75%) Assist level from toilet: Touching or steadying assistance (Pt > 75%)  Function - Chair/bed transfer Chair/bed transfer method: Ambulatory Chair/bed transfer assist level: Supervision or verbal cues Chair/bed transfer assistive device: Armrests, Walker Chair/bed transfer details: Verbal cues for precautions/safety, Verbal cues for safe use of DME/AE  Function - Locomotion: Wheelchair Will patient use wheelchair at discharge?: Yes Type: Manual Max wheelchair distance: 150 Assist Level: No help, No cues, assistive device, takes more than reasonable amount of time Assist Level: No help, No cues, assistive device, takes more than reasonable amount of time  Assist Level: No help, No cues, assistive device, takes more than reasonable amount of time Turns around,maneuvers to table,bed, and toilet,negotiates 3% grade,maneuvers on rugs and over doorsills: Yes Function - Locomotion: Ambulation Assistive device: Walker-rolling Max distance: 300' Assist level: Supervision or verbal cues Assist level: Supervision or verbal cues Walk 50 feet with 2 turns activity did not occur: Safety/medical concerns Assist level: Supervision or verbal cues Walk 150 feet activity did not occur: Safety/medical concerns Assist level: Supervision or verbal cues Walk 10 feet on uneven surfaces activity did not occur: Safety/medical concerns Assist level: Supervision or verbal cues  Function - Comprehension Comprehension: Auditory Comprehension  assist level: Follows basic conversation/direction with no assist  Function - Expression Expression: Verbal Expression assist level: Expresses basic needs/ideas: With no assist  Function - Social Interaction Social Interaction assist level: Interacts appropriately 75 - 89% of the time - Needs redirection for appropriate language or to initiate interaction.  Function - Problem Solving Problem solving assist level: Solves basic problems with no assist  Function - Memory Memory assist level: Recognizes or recalls 75 - 89% of the time/requires cueing 10 - 24% of the time Patient normally able to recall (first 3 days only): Current season  Medical Problem List and Plan:   1.  Right hemiparesis dysarthria/dysphagia secondary to left pontine infarct on 10/14/15 and left posterior lateral temporal infarct 8/27  Cont CIR, PT, OT, SLP- tent D/C 9/13     2.  DVT Prophylaxis/Anticoagulation: Subcutaneous heparin changed to lovenox. Monitor platelet count and any signs of bleeding 3. Pain Management: Tylenol as needed, R knee pain, resolved, some swelling R 4th MCP, will rx volt gel  Now with pain in right elbow - will add voltaren gel to elbow as well 4. Hypertension.   Lopressor discontinued due to bradycardia.  Increased hydralazine 50 mg every 6 hours on 8/26, increased to 100 on 8/27  Norvasc 5 started 8/26  Goal SBP 130-150   Overall, in desired range at present Vitals:   11/06/15 2348 11/07/15 0535  BP: (!) 163/67 (!) 149/70  Pulse:  70  Resp:  18  Temp:  97.8 F (36.6 C)   5. Sinus arrest due to vagal spell. Beta blocker has been discontinued for now. No additional episodes. Follow-up per cardiology services. No plan at this time for pacemaker. 6. Neuropsych: This patient is not fully capable of making decisions on his own behalf.  Started on Zoloft 8/27, changed to Prozaac on 8/28, d/ced 8/31 due to ?nausea 7. Skin/Wound Care: Routine skin check 8. Fluids/Electrolytes/Nutrition:  Routine I&O's  9. Chronic renal insufficiency. Baseline creatinine 1.5.   Cr. at baseline  Will cont to monitor 10. Diet-controlled diabetes mellitus. Hemoglobin A1c 7.3. Check blood sugars before meals and at bedtime. Diabetic teaching.   Now off prednisone   -diet changed to CM medium  -increased amaryl  -Fairly well controlled  CBG (last 3)   Recent Labs  11/06/15 2052 11/07/15 0658 11/07/15 1142  GLUCAP 159* 97 112*  11. CAD/SVT. No chest pain or shortness of breath.  12. Gout.attack resolved  monitor for reoccurance 13. Hyperlipidemia. Pravachol 14. ?Hematuria: resolved, question cath related 16. Urinary retention- starting to void  Confirmed with PVRs  started bethanechol 10 TID, 17. Nausea- improved 18. UTI  - UA neg except +WBCs, few bact, enterococcus >100K,  Amp day 4/7   LOS (Days) 17 A FACE TO FACE EVALUATION WAS PERFORMED  Aletha Allebach Lorie Phenix 11/07/2015, 12:21 PM

## 2015-11-08 DIAGNOSIS — D62 Acute posthemorrhagic anemia: Secondary | ICD-10-CM

## 2015-11-08 DIAGNOSIS — E1142 Type 2 diabetes mellitus with diabetic polyneuropathy: Secondary | ICD-10-CM

## 2015-11-08 LAB — GLUCOSE, CAPILLARY
GLUCOSE-CAPILLARY: 208 mg/dL — AB (ref 65–99)
GLUCOSE-CAPILLARY: 145 mg/dL — AB (ref 65–99)
GLUCOSE-CAPILLARY: 85 mg/dL (ref 65–99)
Glucose-Capillary: 147 mg/dL — ABNORMAL HIGH (ref 65–99)
Glucose-Capillary: 136 mg/dL — ABNORMAL HIGH (ref 65–99)

## 2015-11-08 MED ORDER — GLIMEPIRIDE 2 MG PO TABS
3.0000 mg | ORAL_TABLET | Freq: Every day | ORAL | Status: DC
  Administered 2015-11-09: 3 mg via ORAL
  Filled 2015-11-08: qty 2

## 2015-11-08 MED ORDER — GLIMEPIRIDE 2 MG PO TABS
1.0000 mg | ORAL_TABLET | Freq: Every day | ORAL | Status: AC
  Administered 2015-11-08: 1 mg via ORAL
  Filled 2015-11-08: qty 1

## 2015-11-08 NOTE — Progress Notes (Signed)
Subjective/Complaints: Pt laying in bed this AM.  He states he is "woozy".  When reminded pt that he just woke up, he states, "oh, okay".    ROS-  Denies CP,SOB, N/V/D.  Objective: Vital Signs: Blood pressure (!) 164/49, pulse 89, temperature 99.8 F (37.7 C), temperature source Oral, resp. rate 18, height 5' 8" (1.727 m), weight 76.5 kg (168 lb 10.4 oz), SpO2 99 %. No results found. Results for orders placed or performed during the hospital encounter of 10/21/15 (from the past 72 hour(s))  Glucose, capillary     Status: Abnormal   Collection Time: 11/05/15 11:36 AM  Result Value Ref Range   Glucose-Capillary 130 (H) 65 - 99 mg/dL  Glucose, capillary     Status: Abnormal   Collection Time: 11/05/15  4:44 PM  Result Value Ref Range   Glucose-Capillary 210 (H) 65 - 99 mg/dL  Glucose, capillary     Status: Abnormal   Collection Time: 11/05/15  8:21 PM  Result Value Ref Range   Glucose-Capillary 108 (H) 65 - 99 mg/dL  Glucose, capillary     Status: Abnormal   Collection Time: 11/06/15  6:56 AM  Result Value Ref Range   Glucose-Capillary 147 (H) 65 - 99 mg/dL  Basic metabolic panel     Status: Abnormal   Collection Time: 11/06/15  7:53 AM  Result Value Ref Range   Sodium 133 (L) 135 - 145 mmol/L   Potassium 4.0 3.5 - 5.1 mmol/L   Chloride 100 (L) 101 - 111 mmol/L   CO2 23 22 - 32 mmol/L   Glucose, Bld 161 (H) 65 - 99 mg/dL   BUN 25 (H) 6 - 20 mg/dL   Creatinine, Ser 1.45 (H) 0.61 - 1.24 mg/dL   Calcium 9.4 8.9 - 10.3 mg/dL   GFR calc non Af Amer 42 (L) >60 mL/min   GFR calc Af Amer 49 (L) >60 mL/min    Comment: (NOTE) The eGFR has been calculated using the CKD EPI equation. This calculation has not been validated in all clinical situations. eGFR's persistently <60 mL/min signify possible Chronic Kidney Disease.    Anion gap 10 5 - 15  Glucose, capillary     Status: Abnormal   Collection Time: 11/06/15 12:26 PM  Result Value Ref Range   Glucose-Capillary 146 (H) 65 -  99 mg/dL  Glucose, capillary     Status: None   Collection Time: 11/06/15  4:59 PM  Result Value Ref Range   Glucose-Capillary 94 65 - 99 mg/dL  Glucose, capillary     Status: Abnormal   Collection Time: 11/06/15  8:52 PM  Result Value Ref Range   Glucose-Capillary 159 (H) 65 - 99 mg/dL  Glucose, capillary     Status: None   Collection Time: 11/07/15  6:58 AM  Result Value Ref Range   Glucose-Capillary 97 65 - 99 mg/dL  Glucose, capillary     Status: Abnormal   Collection Time: 11/07/15 11:42 AM  Result Value Ref Range   Glucose-Capillary 112 (H) 65 - 99 mg/dL  Glucose, capillary     Status: Abnormal   Collection Time: 11/07/15  4:55 PM  Result Value Ref Range   Glucose-Capillary 101 (H) 65 - 99 mg/dL  Glucose, capillary     Status: Abnormal   Collection Time: 11/07/15  8:17 PM  Result Value Ref Range   Glucose-Capillary 208 (H) 65 - 99 mg/dL  Glucose, capillary     Status: Abnormal   Collection Time: 11/08/15  6:05 AM  Result Value Ref Range   Glucose-Capillary 147 (H) 65 - 99 mg/dL     HEENT: normocephalic, atraumatic.  Cardio: RRR and no murmur Resp: CTA B/L and unlabored GI: BS positive and NT, ND Skin:   Intact. Warm and dry. Neuro: Alert. Right facial weakness.  Motor 4/5 R delt, bi, tri, grip. (?effort). Apraxia.  4/5 R HF, KE ,4+/5 ADF/PF (?effort) LUE/LLE: 4+/5 proximal to distal Dysarthria Musc/Skel:  No edema. No erythema. Right knee non tender no effusion, right elbow mild TTP, no edema, no erythema Gen: NAD. Vitals signs reviewed.    Assessment/Plan: 1. Functional deficits secondary to Left pontine infarct and new left posterior lateral temporal infarct which require 3+ hours per day of interdisciplinary therapy in a comprehensive inpatient rehab setting. Physiatrist is providing close team supervision and 24 hour management of active medical problems listed below. Physiatrist and rehab team continue to assess barriers to discharge/monitor patient progress  toward functional and medical goals. FIM: Function - Bathing Position: Shower Body parts bathed by patient: Right arm, Left arm, Chest, Abdomen, Front perineal area, Buttocks, Right upper leg, Left upper leg, Left lower leg, Right lower leg, Back Body parts bathed by helper: Right lower leg, Back Bathing not applicable: Back Assist Level: Supervision or verbal cues  Function- Upper Body Dressing/Undressing What is the patient wearing?: Pull over shirt/dress Pull over shirt/dress - Perfomed by patient: Thread/unthread right sleeve, Thread/unthread left sleeve, Put head through opening, Pull shirt over trunk Pull over shirt/dress - Perfomed by helper: Pull shirt over trunk Assist Level: Set up Function - Lower Body Dressing/Undressing What is the patient wearing?: Pants, Shoes, [employer], Non-skid slipper socks Position: Wheelchair/chair at [employer] - Performed by patient: Thread/unthread left underwear leg, Pull underwear up/down, Thread/unthread right underwear leg Underwear - Performed by helper: Pull underwear up/down, Thread/unthread left underwear leg, Thread/unthread right underwear leg Pants- Performed by patient: Thread/unthread right pants leg, Thread/unthread left pants leg, Pull pants up/down Pants- Performed by helper: Fasten/unfasten pants, Pull pants up/down, Thread/unthread right pants leg, Thread/unthread left pants leg Non-skid slipper socks- Performed by helper: Don/doff right sock, Don/doff left sock Shoes - Performed by patient: Don/doff right shoe, Don/doff left shoe Shoes - Performed by helper: Fasten right, Fasten left TED Hose - Performed by helper: Don/doff right TED hose, Don/doff left TED hose Assist for footwear: Dependant Assist for lower body dressing: Touching or steadying assistance (Pt > 75%)  Function - Toileting Toileting activity did not occur: Refused Toileting steps completed by patient: Adjust clothing prior to toileting, Performs perineal  hygiene, Adjust clothing after toileting Toileting steps completed by helper: Adjust clothing prior to toileting, Performs perineal hygiene, Adjust clothing after toileting Toileting Assistive Devices: Grab bar or rail Assist level: Supervision or verbal cues  Function - Air cabin crew transfer assistive device: Elevated toilet seat/BSC over toilet Assist level to toilet: Touching or steadying assistance (Pt > 75%) Assist level from toilet: Touching or steadying assistance (Pt > 75%)  Function - Chair/bed transfer Chair/bed transfer method: Ambulatory Chair/bed transfer assist level: Supervision or verbal cues Chair/bed transfer assistive device: Armrests, Walker Chair/bed transfer details: Verbal cues for precautions/safety, Verbal cues for safe use of DME/AE  Function - Locomotion: Wheelchair Will patient use wheelchair at discharge?: Yes Type: Manual Max wheelchair distance: 150 Assist Level: No help, No cues, assistive device, takes more than reasonable amount of time Assist Level: No help, No cues, assistive device, takes more than reasonable amount of time Assist Level: No  help, No cues, assistive device, takes more than reasonable amount of time Turns around,maneuvers to table,bed, and toilet,negotiates 3% grade,maneuvers on rugs and over doorsills: Yes Function - Locomotion: Ambulation Assistive device: Walker-rolling Max distance: 300' Assist level: Supervision or verbal cues Assist level: Supervision or verbal cues Walk 50 feet with 2 turns activity did not occur: Safety/medical concerns Assist level: Supervision or verbal cues Walk 150 feet activity did not occur: Safety/medical concerns Assist level: Supervision or verbal cues Walk 10 feet on uneven surfaces activity did not occur: Safety/medical concerns Assist level: Supervision or verbal cues  Function - Comprehension Comprehension: Auditory Comprehension assist level: Follows basic conversation/direction  with extra time/assistive device  Function - Expression Expression: Verbal Expression assist level: Expresses basic 90% of the time/requires cueing < 10% of the time.  Function - Social Interaction Social Interaction assist level: Interacts appropriately 90% of the time - Needs monitoring or encouragement for participation or interaction.  Function - Problem Solving Problem solving assist level: Solves basic 75 - 89% of the time/requires cueing 10 - 24% of the time  Function - Memory Memory assist level: Recognizes or recalls 75 - 89% of the time/requires cueing 10 - 24% of the time Patient normally able to recall (first 3 days only): Current season  Medical Problem List and Plan:   1.  Right hemiparesis dysarthria/dysphagia secondary to left pontine infarct on 10/14/15 and left posterior lateral temporal infarct 8/27  Cont CIR, PT, OT, SLP- tent D/C 9/13     2.  DVT Prophylaxis/Anticoagulation: Subcutaneous heparin changed to lovenox. Monitor platelet count and any signs of bleeding 3. Pain Management: Tylenol as needed, R knee pain, resolved, some swelling R 4th MCP, will rx volt gel  Now with pain in right elbow - will add voltaren gel to elbow as well 4. Hypertension.   Lopressor discontinued due to bradycardia.  Increased hydralazine 50 mg every 6 hours on 8/26, increased to 100 on 8/27  Norvasc 5 started 8/26  Goal SBP 130-150   Overall, in desired range at present Vitals:   11/08/15 0406 11/08/15 0912  BP: (!) 163/65 (!) 164/49  Pulse: 89   Resp: 18   Temp: 99.8 F (37.7 C)    5. Sinus arrest due to vagal spell. Beta blocker has been discontinued for now. No additional episodes. Follow-up per cardiology services. No plan at this time for pacemaker. 6. Neuropsych: This patient is not fully capable of making decisions on his own behalf.  Started on Zoloft 8/27, changed to Prozaac on 8/28, d/ced 8/31 due to ?nausea 7. Skin/Wound Care: Routine skin check 8.  Fluids/Electrolytes/Nutrition: Routine I&O's  9. Chronic renal insufficiency. Baseline creatinine 1.5.   Cr. at baseline  Will cont to monitor 10. Diet-controlled diabetes mellitus. Hemoglobin A1c 7.3. Check blood sugars before meals and at bedtime. Diabetic teaching.   Now off prednisone   -diet changed to CM medium  -increased amaryl to 64m on 9/10  -Will cont to monitor CBG (last 3)   Recent Labs  11/07/15 1655 11/07/15 2017 11/08/15 0605  GLUCAP 101* 208* 147*  11. CAD/SVT. No chest pain or shortness of breath.  12. Gout.attack resolved  monitor for reoccurance 13. Hyperlipidemia. Pravachol 14. ?Hematuria: resolved, question cath related  Labs ordered for tomorrow 16. Urinary retention- Improving  Confirmed with PVRs  started bethanechol 10 TID, 17. Nausea- improved 18. UTI  - UA neg except +WBCs, few bact, enterococcus >100K,  Amp day 5/7  Labs ordered for tomorrow  LOS (  Days) 18 A FACE TO FACE EVALUATION WAS PERFORMED  Alahni Varone Lorie Phenix 11/08/2015, 9:45 AM

## 2015-11-09 ENCOUNTER — Inpatient Hospital Stay (HOSPITAL_COMMUNITY): Payer: Medicare Other

## 2015-11-09 ENCOUNTER — Inpatient Hospital Stay (HOSPITAL_COMMUNITY): Payer: Medicare Other | Admitting: Occupational Therapy

## 2015-11-09 ENCOUNTER — Inpatient Hospital Stay (HOSPITAL_COMMUNITY): Payer: Medicare Other | Admitting: Speech Pathology

## 2015-11-09 DIAGNOSIS — M10021 Idiopathic gout, right elbow: Secondary | ICD-10-CM

## 2015-11-09 DIAGNOSIS — M25521 Pain in right elbow: Secondary | ICD-10-CM

## 2015-11-09 DIAGNOSIS — D62 Acute posthemorrhagic anemia: Secondary | ICD-10-CM

## 2015-11-09 LAB — CBC WITH DIFFERENTIAL/PLATELET
BASOS PCT: 0 %
Basophils Absolute: 0 10*3/uL (ref 0.0–0.1)
Eosinophils Absolute: 0.2 10*3/uL (ref 0.0–0.7)
Eosinophils Relative: 2 %
HEMATOCRIT: 40 % (ref 39.0–52.0)
HEMOGLOBIN: 13.1 g/dL (ref 13.0–17.0)
LYMPHS ABS: 0.8 10*3/uL (ref 0.7–4.0)
LYMPHS PCT: 11 %
MCH: 29.8 pg (ref 26.0–34.0)
MCHC: 32.8 g/dL (ref 30.0–36.0)
MCV: 90.9 fL (ref 78.0–100.0)
MONOS PCT: 14 %
Monocytes Absolute: 1.1 10*3/uL — ABNORMAL HIGH (ref 0.1–1.0)
NEUTROS ABS: 5.3 10*3/uL (ref 1.7–7.7)
NEUTROS PCT: 73 %
Platelets: 217 10*3/uL (ref 150–400)
RBC: 4.4 MIL/uL (ref 4.22–5.81)
RDW: 13.7 % (ref 11.5–15.5)
WBC: 7.4 10*3/uL (ref 4.0–10.5)

## 2015-11-09 LAB — BASIC METABOLIC PANEL
ANION GAP: 7 (ref 5–15)
BUN: 17 mg/dL (ref 6–20)
CALCIUM: 8.9 mg/dL (ref 8.9–10.3)
CO2: 25 mmol/L (ref 22–32)
Chloride: 105 mmol/L (ref 101–111)
Creatinine, Ser: 1.26 mg/dL — ABNORMAL HIGH (ref 0.61–1.24)
GFR, EST AFRICAN AMERICAN: 58 mL/min — AB (ref >60)
GFR, EST NON AFRICAN AMERICAN: 50 mL/min — AB (ref >60)
GLUCOSE: 91 mg/dL (ref 65–99)
POTASSIUM: 4 mmol/L (ref 3.5–5.1)
Sodium: 137 mmol/L (ref 135–145)

## 2015-11-09 LAB — GLUCOSE, CAPILLARY
GLUCOSE-CAPILLARY: 103 mg/dL — AB (ref 65–99)
GLUCOSE-CAPILLARY: 269 mg/dL — AB (ref 65–99)
Glucose-Capillary: 288 mg/dL — ABNORMAL HIGH (ref 65–99)
Glucose-Capillary: 304 mg/dL — ABNORMAL HIGH (ref 65–99)

## 2015-11-09 MED ORDER — PREDNISONE 20 MG PO TABS
20.0000 mg | ORAL_TABLET | Freq: Every day | ORAL | Status: DC
  Administered 2015-11-09 – 2015-11-11 (×3): 20 mg via ORAL
  Filled 2015-11-09 (×3): qty 1

## 2015-11-09 NOTE — Progress Notes (Signed)
Social Work Patient ID: [name], male   DOB: June 21, 1927, 82 y.o.   MRN: [id number]  Daughter here for PT session asked her to come in tomorrow for OT session she will try too. Discussed discharge needs-ie equipment and follow up therapies. She wants him to have a wheelchair for longer distances due to not walking far. She is aware of his gout flare up  This weekend. He is continuing to participate in therapies with team. Work toward discharge Wed.

## 2015-11-09 NOTE — Progress Notes (Signed)
Speech Language Pathology Daily Session Note  Patient Details  Name: [name] MRN: [id number] Date of Birth: November 25, 1927  Today's Date: 11/09/2015 SLP Individual Time: 0803-0903 SLP Individual Time Calculation (min): 60 min   Short Term Goals: Week 3: SLP Short Term Goal 1 (Week 3): Pt will recognize and correct errors in the moment during basic functional tasks with min assist verbal cues.  SLP Short Term Goal 2 (Week 3): Pt will utilize speech intelligibility strategies at the simple conversation level with mod I over 3 targeted sessions SLP Short Term Goal 3 (Week 3): Pt will demonstrate functional problem solving for mildly complex tasks with supervision verbal cues.  SLP Short Term Goal 4 (Week 3): Pt will demonstrate selective attention to task in a mildly distracting environment for 15 minutes with Min assist verbal cues for redirection  Skilled Therapeutic Interventions:  Pt was seen for skilled ST targeting cognitive goals.  Pt anticipated needing to use the restroom with supervision question cues and demonstrated improved safety with mobility while using rolling walker with supervision verbal cues.   Therapist administered portions of the MoCA basic to measure progress from initial evaluation.  Pt required mod assist verbal cues for functional problem solving with money management subtests and min assist verbal cues for 5 minute delayed recall of 5 words.  Pt required mod assist verbal cues for abstract reasoning subtests.  Will plan to complete assessment at next appointment as appropriate.  Pt's daughter would also benefit from brief education prior to discharge as she has not recently been in attendance during therapy sessions.  Will make CSW aware.  Pt was returned to room and left in wheelchair with call bell within reach.  Continue per current plan of care.       Function:  Eating Eating                 Cognition Comprehension Comprehension assist level: Understands  complex 90% of the time/cues 10% of the time  Expression   Expression assist level: Expresses complex 90% of the time/cues < 10% of the time  Social Interaction Social Interaction assist level: Interacts appropriately with others with medication or extra time (anti-anxiety, antidepressant).  Problem Solving Problem solving assist level: Solves basic 75 - 89% of the time/requires cueing 10 - 24% of the time  Memory Memory assist level: Recognizes or recalls 75 - 89% of the time/requires cueing 10 - 24% of the time    Pain Pain Assessment Pain Assessment: No/denies pain  Therapy/Group: Individual Therapy  Quanah Majka, Selinda Orion 11/09/2015, 12:51 PM

## 2015-11-09 NOTE — Discharge Instructions (Signed)
Inpatient Rehab Discharge Instructions  Elijan Bault Discharge date and time: No discharge date for patient encounter.   Activities/Precautions/ Functional Status: Activity: As tolerated Diet: Mechanical soft diabetic diet Wound Care: None needed Functional status:  ___ No restrictions     ___ Walk up steps independently ___ 24/7 supervision/assistance   ___ Walk up steps with assistance ___ Intermittent supervision/assistance  ___ Bathe/dress independently ___ Walk with walker     _x__ Bathe/dress with assistance ___ Walk Independently    ___ Shower independently ___ Walk with assistance    ___ Shower with assistance ___ No alcohol     ___ Return to work/school ________  Special Instructions: Follow-up cardiology services Dr. Osie Cheeks    COMMUNITY REFERRALS UPON DISCHARGE:    Home Health:   PT, OT, SP, RN, [place]   Date of last service:11/11/2015  Medical Equipment/Items Ordered:WHEELCHAIR, ROLLING WALKER, [place] WITH BACK  Agency/Supplier:[facility]    [phone]   GENERAL COMMUNITY RESOURCES FOR PATIENT/FAMILY: Support Groups:[facility] SUPPORT GROUP EVERY SECOND Thursday @ 3:00-4:00 PM ON THE REHAB UNIT QUESTIONS CONTACT KATIE [id number]  STROKE/TIA DISCHARGE INSTRUCTIONS SMOKING Cigarette smoking nearly doubles your risk of having a stroke & is the single most alterable risk factor  If you smoke or have smoked in the last 12 months, you are advised to quit smoking for your health.  Most of the excess cardiovascular risk related to smoking disappears within a year of stopping.  Ask you doctor about anti-smoking medications  [place] Quit Line: 1-800-QUIT NOW  Free Smoking Cessation Classes [phone]  CHOLESTEROL Know your levels; limit fat & cholesterol in your diet  Lipid Panel     Component Value Date/Time   CHOL 207 (H) 10/17/2015 0551   TRIG 159 (H) 10/17/2015 0551   HDL 34 (L)  10/17/2015 0551   CHOLHDL 6.1 10/17/2015 0551   VLDL 32 10/17/2015 0551   LDLCALC 141 (H) 10/17/2015 0551      Many patients benefit from treatment even if their cholesterol is at goal.  Goal: Total Cholesterol (CHOL) less than 160  Goal:  Triglycerides (TRIG) less than 150  Goal:  HDL greater than 40  Goal:  LDL (LDLCALC) less than 100   BLOOD PRESSURE American Stroke Association blood pressure target is less that 120/80 mm/Hg  Your discharge blood pressure is:  BP: (!) 150/72  Monitor your blood pressure  Limit your salt and alcohol intake  Many individuals will require more than one medication for high blood pressure  DIABETES (A1c is a blood sugar average for last 3 months) Goal HGBA1c is under 7% (HBGA1c is blood sugar average for last 3 months)  Diabetes:     Lab Results  Component Value Date   HGBA1C 7.3 (H) 10/17/2015     Your HGBA1c can be lowered with medications, healthy diet, and exercise.  Check your blood sugar as directed by your physician  Call your physician if you experience unexplained or low blood sugars.  PHYSICAL ACTIVITY/REHABILITATION Goal is 30 minutes at least 4 days per week  Activity: Increase activity slowly, Therapies: Physical Therapy: Home Health Return to work:   Activity decreases your risk of heart attack and stroke and makes your heart stronger.  It helps control your weight and blood pressure; helps you relax and can improve your mood.  Participate in a regular exercise program.  Talk with your doctor about the best form of exercise for you (dancing, walking, swimming,  cycling).  DIET/WEIGHT Goal is to maintain a healthy weight  Your discharge diet is: DIET DYS 3 Room service appropriate? Yes; Fluid consistency: Thin  liquids Your height is:  Height: 5\' 8"  (172.7 cm) Your current weight is: Weight: 76.3 kg (168 lb 3.4 oz) Your Body Mass Index (BMI) is:  BMI (Calculated): 28.6  Following the type of diet specifically designed for  you will help prevent another stroke.  Your goal weight range is:    Your goal Body Mass Index (BMI) is 19-24.  Healthy food habits can help reduce 3 risk factors for stroke:  High cholesterol, hypertension, and excess weight.  RESOURCES Stroke/Support Group:  Call [phone]   STROKE EDUCATION PROVIDED/REVIEWED AND GIVEN TO PATIENT Stroke warning signs and symptoms How to activate emergency medical system (call 911). Medications prescribed at discharge. Need for follow-up after discharge. Personal risk factors for stroke. Pneumonia vaccine given:  Flu vaccine given:  My questions have been answered, the writing is legible, and I understand these instructions.  I will adhere to these goals & educational materials that have been provided to me after my discharge from the hospital.    in regards to plan for placement of loop recorder   My questions have been answered and I understand these instructions. I will adhere to these goals and the provided educational materials after my discharge from the hospital.  Patient/Caregiver Signature _______________________________ Date __________  Clinician Signature _______________________________________ Date __________  Please bring this form and your medication list with you to all your follow-up doctor's appointments.

## 2015-11-09 NOTE — Progress Notes (Signed)
Physical Therapy Note  Patient Details  Name: [name] MRN: [id number] Date of Birth: 1927/06/03 Today's Date: 11/09/2015  1300-1415, 75 min individual tx Pain: none reported  Family ed completed with dtr [name] for basic transfers,  furniture transfers to low sofa, toilet transfers, simulated car transfer to sedan ht seat, head hips relationship, use of RW safely, w/c parts mgt and folding w/c, gait on level tile and carpet, bed mobility.  Pt continent of B and B in toilet in room. Pt left sitting up in w/c with quick release belt donned and all needs within reach.  PT spoke with Becky,CSW regarding family's request for w/c for community mobility.  Jacqlyn Larsen will check with insurance.   Justin Buechner 11/09/2015, 12:22 PM

## 2015-11-09 NOTE — Progress Notes (Signed)
Subjective/Complaints:   ROS-  Denies CP,SOB, N/V/D.  Objective: Vital Signs: Blood pressure (!) 150/72, pulse 94, temperature 98 F (36.7 C), temperature source Oral, resp. rate 18, height _0  (1.727 m), weight 76.3 kg (168 lb 3.4 oz), SpO2 94 %. No results found. Results for orders placed or performed during the hospital encounter of 10/21/15 (from the past 72 hour(s))  Glucose, capillary     Status: Abnormal   Collection Time: 11/06/15 12:26 PM  Result Value Ref Range   Glucose-Capillary 146 (H) 65 - 99 mg/dL  Glucose, capillary     Status: None   Collection Time: 11/06/15  4:59 PM  Result Value Ref Range   Glucose-Capillary 94 65 - 99 mg/dL  Glucose, capillary     Status: Abnormal   Collection Time: 11/06/15  8:52 PM  Result Value Ref Range   Glucose-Capillary 159 (H) 65 - 99 mg/dL  Glucose, capillary     Status: None   Collection Time: 11/07/15  6:58 AM  Result Value Ref Range   Glucose-Capillary 97 65 - 99 mg/dL  Glucose, capillary     Status: Abnormal   Collection Time: 11/07/15 11:42 AM  Result Value Ref Range   Glucose-Capillary 112 (H) 65 - 99 mg/dL  Glucose, capillary     Status: Abnormal   Collection Time: 11/07/15  4:55 PM  Result Value Ref Range   Glucose-Capillary 101 (H) 65 - 99 mg/dL  Glucose, capillary     Status: Abnormal   Collection Time: 11/07/15  8:17 PM  Result Value Ref Range   Glucose-Capillary 208 (H) 65 - 99 mg/dL  Glucose, capillary     Status: Abnormal   Collection Time: 11/08/15  6:05 AM  Result Value Ref Range   Glucose-Capillary 147 (H) 65 - 99 mg/dL  Glucose, capillary     Status: Abnormal   Collection Time: 11/08/15 12:00 PM  Result Value Ref Range   Glucose-Capillary 145 (H) 65 - 99 mg/dL  Glucose, capillary     Status: None   Collection Time: 11/08/15  4:42 PM  Result Value Ref Range   Glucose-Capillary 85 65 - 99 mg/dL  Glucose, capillary     Status: Abnormal   Collection Time: 11/08/15  8:38 PM  Result Value Ref Range    Glucose-Capillary 136 (H) 65 - 99 mg/dL  Basic metabolic panel     Status: Abnormal   Collection Time: 11/09/15  5:21 AM  Result Value Ref Range   Sodium 137 135 - 145 mmol/L   Potassium 4.0 3.5 - 5.1 mmol/L   Chloride 105 101 - 111 mmol/L   CO2 25 22 - 32 mmol/L   Glucose, Bld 91 65 - 99 mg/dL   BUN 17 6 - 20 mg/dL   Creatinine, Ser 1.26 (H) 0.61 - 1.24 mg/dL   Calcium 8.9 8.9 - 10.3 mg/dL   GFR calc non Af Amer 50 (L) >60 mL/min   GFR calc Af Amer 58 (L) >60 mL/min    Comment: (NOTE) The eGFR has been calculated using the CKD EPI equation. This calculation has not been validated in all clinical situations. eGFR's persistently <60 mL/min signify possible Chronic Kidney Disease.    Anion gap 7 5 - 15  CBC with Differential/Platelet     Status: Abnormal   Collection Time: 11/09/15  5:21 AM  Result Value Ref Range   WBC 7.4 4.0 - 10.5 K/uL   RBC 4.40 4.22 - 5.81 MIL/uL   Hemoglobin 13.1 13.0 -  17.0 g/dL   HCT 40.0 39.0 - 52.0 %   MCV 90.9 78.0 - 100.0 fL   MCH 29.8 26.0 - 34.0 pg   MCHC 32.8 30.0 - 36.0 g/dL   RDW 13.7 11.5 - 15.5 %   Platelets 217 150 - 400 K/uL   Neutrophils Relative % 73 %   Neutro Abs 5.3 1.7 - 7.7 K/uL   Lymphocytes Relative 11 %   Lymphs Abs 0.8 0.7 - 4.0 K/uL   Monocytes Relative 14 %   Monocytes Absolute 1.1 (H) 0.1 - 1.0 K/uL   Eosinophils Relative 2 %   Eosinophils Absolute 0.2 0.0 - 0.7 K/uL   Basophils Relative 0 %   Basophils Absolute 0.0 0.0 - 0.1 K/uL  Glucose, capillary     Status: Abnormal   Collection Time: 11/09/15  6:34 AM  Result Value Ref Range   Glucose-Capillary 103 (H) 65 - 99 mg/dL     HEENT: normocephalic, atraumatic.  Cardio: RRR and no murmur Resp: CTA B/L and unlabored GI: BS positive and NT, ND Skin:   Intact. Warm and dry. Neuro: Alert. Right facial weakness.  Motor 4/5 R delt, bi, tri, grip. (?effort). Apraxia.  4/5 R HF, KE ,4+/5 ADF/PF (?effort) LUE/LLE: 4+/5 proximal to distal Dysarthria Musc/Skel:  No  edema. No erythema. Right knee non tender no effusion, right elbow mild TTP, no edema, no erythema Gen: NAD. Vitals signs reviewed.    Assessment/Plan: 1. Functional deficits secondary to Left pontine infarct and new left posterior lateral temporal infarct which require 3+ hours per day of interdisciplinary therapy in a comprehensive inpatient rehab setting. Physiatrist is providing close team supervision and 24 hour management of active medical problems listed below. Physiatrist and rehab team continue to assess barriers to discharge/monitor patient progress toward functional and medical goals. FIM: Function - Bathing Position: Shower Body parts bathed by patient: Right arm, Left arm, Chest, Abdomen, Front perineal area, Buttocks, Right upper leg, Left upper leg, Left lower leg, Right lower leg, Back Body parts bathed by helper: Right lower leg, Back Bathing not applicable: Back Assist Level: Supervision or verbal cues  Function- Upper Body Dressing/Undressing What is the patient wearing?: Pull over shirt/dress Pull over shirt/dress - Perfomed by patient: Thread/unthread right sleeve, Thread/unthread left sleeve, Put head through opening, Pull shirt over trunk Pull over shirt/dress - Perfomed by helper: Pull shirt over trunk Assist Level: Set up Function - Lower Body Dressing/Undressing What is the patient wearing?: Pants, Shoes, [employer], Non-skid slipper socks Position: Wheelchair/chair at [employer] - Performed by patient: Thread/unthread left underwear leg, Pull underwear up/down, Thread/unthread right underwear leg Underwear - Performed by helper: Pull underwear up/down, Thread/unthread left underwear leg, Thread/unthread right underwear leg Pants- Performed by patient: Thread/unthread right pants leg, Thread/unthread left pants leg, Pull pants up/down Pants- Performed by helper: Fasten/unfasten pants, Pull pants up/down, Thread/unthread right pants leg, Thread/unthread left  pants leg Non-skid slipper socks- Performed by helper: Don/doff right sock, Don/doff left sock Shoes - Performed by patient: Don/doff right shoe, Don/doff left shoe Shoes - Performed by helper: Fasten right, Fasten left TED Hose - Performed by helper: Don/doff right TED hose, Don/doff left TED hose Assist for footwear: Dependant Assist for lower body dressing: Touching or steadying assistance (Pt > 75%)  Function - Toileting Toileting activity did not occur: Refused Toileting steps completed by patient: Adjust clothing prior to toileting, Performs perineal hygiene, Adjust clothing after toileting Toileting steps completed by helper: Adjust clothing prior to toileting, Performs perineal  hygiene, Adjust clothing after toileting Toileting Assistive Devices: Grab bar or rail Assist level: Supervision or verbal cues  Function - Toilet Transfers Toilet transfer assistive device: Elevated toilet seat/BSC over toilet Assist level to toilet: Touching or steadying assistance (Pt > 75%) Assist level from toilet: Touching or steadying assistance (Pt > 75%)  Function - Chair/bed transfer Chair/bed transfer method: Ambulatory Chair/bed transfer assist level: Supervision or verbal cues Chair/bed transfer assistive device: Armrests, Walker Chair/bed transfer details: Verbal cues for precautions/safety, Verbal cues for safe use of DME/AE  Function - Locomotion: Wheelchair Will patient use wheelchair at discharge?: Yes Type: Manual Max wheelchair distance: 150 Assist Level: No help, No cues, assistive device, takes more than reasonable amount of time Assist Level: No help, No cues, assistive device, takes more than reasonable amount of time Assist Level: No help, No cues, assistive device, takes more than reasonable amount of time Turns around,maneuvers to table,bed, and toilet,negotiates 3% grade,maneuvers on rugs and over doorsills: Yes Function - Locomotion: Ambulation Assistive device:  Walker-rolling Max distance: 300' Assist level: Supervision or verbal cues Assist level: Supervision or verbal cues Walk 50 feet with 2 turns activity did not occur: Safety/medical concerns Assist level: Supervision or verbal cues Walk 150 feet activity did not occur: Safety/medical concerns Assist level: Supervision or verbal cues Walk 10 feet on uneven surfaces activity did not occur: Safety/medical concerns Assist level: Supervision or verbal cues  Function - Comprehension Comprehension: Auditory Comprehension assist level: Follows basic conversation/direction with extra time/assistive device  Function - Expression Expression: Verbal Expression assist level: Expresses basic 90% of the time/requires cueing < 10% of the time.  Function - Social Interaction Social Interaction assist level: Interacts appropriately 90% of the time - Needs monitoring or encouragement for participation or interaction.  Function - Problem Solving Problem solving assist level: Solves basic 75 - 89% of the time/requires cueing 10 - 24% of the time  Function - Memory Memory assist level: Recognizes or recalls 75 - 89% of the time/requires cueing 10 - 24% of the time Patient normally able to recall (first 3 days only): Current season  Medical Problem List and Plan:   1.  Right hemiparesis dysarthria/dysphagia secondary to left pontine infarct on 10/14/15 and left posterior lateral temporal infarct 8/27  Cont CIR, PT, OT, SLP- tent D/C 9/13     2.  DVT Prophylaxis/Anticoagulation: Subcutaneous heparin changed to lovenox. Monitor platelet count and any signs of bleeding 3. Pain Management: Tylenol as needed, R knee pain, resolved, some swelling R 4th MCP, will rx volt gel  Now with pain in right elbow - likely gout flare, less likely olecranon bursitis given degree of erythema, prednisone burst. '20mg'$  po x 5 d 4. Hypertension.   Lopressor discontinued due to bradycardia.  Increased hydralazine 50 mg every 6  hours on 8/26, increased to 100 on 8/27  Norvasc 5 started 8/26  Goal SBP 130-150   Overall, in desired range at present Vitals:   11/08/15 2309 11/09/15 0500  BP: (!) 150/60 (!) 150/72  Pulse:  94  Resp:  18  Temp:  98 F (36.7 C)   5. Sinus arrest due to vagal spell. Beta blocker has been discontinued for now. No additional episodes. Follow-up per cardiology services. No plan at this time for pacemaker. 6. Neuropsych: This patient is not fully capable of making decisions on his own behalf.  Started on Zoloft 8/27, changed to Prozaac on 8/28, d/ced 8/31 due to ?nausea 7. Skin/Wound Care: Routine skin check 8.  Fluids/Electrolytes/Nutrition: Routine I&O's  9. Chronic renal insufficiency. Baseline creatinine 1.5. Today's value actually better than baseline 1.26  Cr. at baseline  Will cont to monitor 10. Diet-controlled diabetes mellitus. Hemoglobin A1c 7.3. Check blood sugars before meals and at bedtime. Diabetic teaching.   Now off prednisone   -diet changed to CM medium  -increased amaryl to 72m on 9/10  -Will cont to monitor CBG (last 3)   Recent Labs  11/08/15 1642 11/08/15 2038 11/09/15 0634  GLUCAP 85 136* 103*  11. CAD/SVT. No chest pain or shortness of breath.  12. Gout.attack resolved  monitor for reoccurance 13. Hyperlipidemia. Pravachol 14. ?Hematuria: resolved, question cath related  Labs ordered for tomorrow 16. Urinary retention- Improving  Confirmed with PVRs  started bethanechol 10 TID, 17. Nausea- improved 18. UTI  - UA neg except +WBCs, few bact, enterococcus >100K,  Amp day 5/7  Labs reviewed , basically normal  LOS (Days) 19 A FACE TO FACE EVALUATION WAS PERFORMED  Lataysha Vohra E 11/09/2015, 8:10 AM

## 2015-11-09 NOTE — Progress Notes (Signed)
Occupational Therapy Session Note  Patient Details  Name: Tommey Ouimette MRN: [id number] Date of Birth: [birth year]  Today's Date: 11/09/2015 OT Individual Time: 1001-1100 OT Individual Time Calculation (min): 59 min     Short Term Goals: Week 3:  OT Short Term Goal 1 (Week 3): Continue working on [facility] at supervision.    Skilled Therapeutic Interventions/Progress Updates:    Pt completed bathing, toileting, and dressing tasks during session.  Supervision for all transfers to and from the toilet and shower with use of the RW.  He was able to complete bathing with mod instructional cueing for sustained attention to task and supervision.  Use of grab bars in standing for support when washing peri area.  He was able to use the LUE for bathing with modified independence.  Completed toileting with supervision as well using the elevated toilet with rail and the RW.  Transferred to the wheelchair for dressing tasks.   Mod instructional cueing for scooting to the edge of the chair and using his RUE to assist with sit to stand from the chair instead of attempting to hold onto the walker.  He was able to complete all LB dressing except for donning TEDs and tying his left shoe.  He was able to tie the right shoe with the RLE propped on a trash can.  Pt left in wheelchair at end of session with call button in reach.   Therapy Documentation Precautions:  Precautions Precautions: Fall Precaution Comments: right hemiparesis Restrictions Weight Bearing Restrictions: No Pain: Pain Assessment Pain Assessment: No/denies pain Pain Score: 3  ADL: See Function Navigator for Current Functional Status.   Therapy/Group: Individual Therapy  Shital Crayton OTR/L 11/09/2015, 2:03 PM

## 2015-11-10 ENCOUNTER — Inpatient Hospital Stay (HOSPITAL_COMMUNITY): Payer: Medicare Other | Admitting: Physical Therapy

## 2015-11-10 ENCOUNTER — Inpatient Hospital Stay (HOSPITAL_COMMUNITY): Payer: Medicare Other | Admitting: Occupational Therapy

## 2015-11-10 ENCOUNTER — Inpatient Hospital Stay (HOSPITAL_COMMUNITY): Payer: Medicare Other | Admitting: Speech Pathology

## 2015-11-10 LAB — GLUCOSE, CAPILLARY
GLUCOSE-CAPILLARY: 148 mg/dL — AB (ref 65–99)
GLUCOSE-CAPILLARY: 233 mg/dL — AB (ref 65–99)
Glucose-Capillary: 121 mg/dL — ABNORMAL HIGH (ref 65–99)
Glucose-Capillary: 69 mg/dL (ref 65–99)

## 2015-11-10 MED ORDER — GLIMEPIRIDE 2 MG PO TABS
2.0000 mg | ORAL_TABLET | Freq: Every day | ORAL | Status: DC
  Administered 2015-11-10 – 2015-11-11 (×2): 2 mg via ORAL
  Filled 2015-11-10 (×2): qty 1

## 2015-11-10 NOTE — Progress Notes (Signed)
Occupational Therapy Discharge Summary  Patient Details  Name: [name] MRN: [id number] Date of Birth: [birth year]  Today's Date: 11/10/2015 OT Individual Time: 0901-1005 OT Individual Time Calculation (min): 64 min   Session Note:  Pt completed bathing and dressing tasks with daughter [name] present for education.  He was able to complete transfer into and out of the walk-in shower with supervision.  Discussed placement of hand held shower and grab bar at home for increased safety.  Min questioning cueing needed to sequence through bathing and dressing but he was able to complete all aspects with supervision except for donning TEDS.  Daughter was able to provide safe assistance for transfer out of the shower as well as for sit to stand with LB selfcare.  Pt was able to donn his shoes today with them being tied.  FM coordination handout issued as well to be completed at home in order to increase RUE function and use.    Patient has met 11 of 11 long term goals due to improved balance, postural control, ability to compensate for deficits, functional use of  RIGHT upper and RIGHT lower extremity, improved attention, improved awareness and improved coordination.  Patient to discharge at overall Supervision level.  Patient's care partner is independent to provide the necessary physical and cognitive assistance at discharge.    Reasons goals not met: NA  Recommendation:  Patient will benefit from ongoing skilled OT services in home health setting to continue to advance functional skills in the area of BADL.  Pt still demonstrates decreased balance as well as decreased RUE functional use.  Increased dizziness is still present with transitional movements as well.  Feel continued OT  Home health is warranted in order for pt to reach modified independent level for return home alone in the near future.      Equipment: shower seat, 3:1  Reasons for discharge: treatment goals met and discharge from  hospital  Patient/family agrees with progress made and goals achieved: Yes  OT Discharge Precautions/Restrictions Precautions Precautions: Fall Precaution Comments: right hemiparesis Restrictions Weight Bearing Restrictions: No  Vital Signs Therapy Vitals Temp: 97.5 F (36.4 C) Pulse Rate: 95 Resp: 18 BP: (!) 162/67 Patient Position (if appropriate): Sitting Oxygen Therapy SpO2: 95 % O2 Device: Not Delivered Pain Pain Assessment Pain Assessment: No/denies pain Pain Score: 0-No pain ADL  See Function section of chart for details  Vision/Perception  Vision- History Baseline Vision/History: No visual deficits Wears Glasses: Reading only Patient Visual Report: Other (comment) (Pt reports things moving around at times) Vision- Assessment Vision Assessment?: Yes Eye Alignment: Within Functional Limits Ocular Range of Motion: Restricted looking up Alignment/Gaze Preference: Within Defined Limits Tracking/Visual Pursuits: Decreased smoothness of horizontal tracking;Decreased smoothness of vertical tracking;Requires cues, head turns, or add eye shifts to track Convergence: Impaired (comment) (Decreased convergence) Visual Fields: No apparent deficits Diplopia Assessment: Other (comment) (Pt reports intermittent diplopia but not present at this session) Additional Comments: Pt with delayed VOR reflex so reports dizziness and things "moving around" during functional mobility and transitional movements.  Cognition Overall Cognitive Status: Impaired/Different from baseline Arousal/Alertness: Awake/alert Orientation Level: Oriented X4 Attention: Selective Sustained Attention: Appears intact Selective Attention: Impaired Selective Attention Impairment: Functional basic;Functional complex Memory: Impaired Memory Impairment: Decreased recall of new information;Storage deficit Awareness: Impaired Awareness Impairment: Anticipatory impairment;Emergent impairment Problem Solving:  Impaired Problem Solving Impairment: Functional basic Safety/Judgment: Impaired Comments: Pt still needs cueing for hand placement as well as sequencing with functional transfers and sit to stand with LB  selfcare.  Also needs cueing if distracted with conversation to be able to maintain attention back to bathing or dressing tasks.  Sensation Sensation Light Touch: Appears Intact Stereognosis: Not tested Hot/Cold: Appears Intact Proprioception: Appears Intact Coordination Gross Motor Movements are Fluid and Coordinated: No Fine Motor Movements are Fluid and Coordinated: No Coordination and Movement Description: Pt demonstrates Brunnstrum stage V movement in the RUE with decreased FM coordination and gross coordination when attempting to reach behind his head or behind his back for pulling up pants.  He is able to use the RUE with increased time to tie his pants and for opening soap and deodorant.   Motor  Motor Motor: Hemiplegia Mobility  Transfers Transfers: Stand to Sit;Sit to Stand Sit to Stand: 5: Supervision;With upper extremity assist;From chair/3-in-1;With armrests Sit to Stand Details: Verbal cues for sequencing;Verbal cues for precautions/safety Stand to Sit: 5: Supervision Stand to Sit Details (indicate cue type and reason): Verbal cues for sequencing  Trunk/Postural Assessment  Cervical Assessment Cervical Assessment: Exceptions to [facility] Cervical AROM Overall Cervical AROM Comments: Limited AROM in all directions Thoracic Assessment Thoracic Assessment: Exceptions to [facility] (rounded thoracic region) Lumbar Assessment Lumbar Assessment: Exceptions to [facility] (posterior pelvic tilt with limited lumbar mobility during selfcare tasks)  Balance Balance Balance Assessed: Yes Static Sitting Balance Static Sitting - Level of Assistance: 7: Independent Dynamic Sitting Balance Dynamic Sitting - Level of Assistance: 5: Stand by assistance Static Standing Balance Static Standing -  Balance Support: No upper extremity supported Static Standing - Level of Assistance: 5: Stand by assistance Dynamic Standing Balance Dynamic Standing - Balance Support: Bilateral upper extremity supported Dynamic Standing - Level of Assistance: 5: Stand by assistance Extremity/Trunk Assessment RUE Assessment RUE Assessment: Exceptions to [facility] RUE Strength RUE Overall Strength Comments: Pt demonstrates Brunnstrum stage V movment in the right arm and hand.  Still with gout affecting the left elbow and ring finger, but demonstrates full AROM digit flexion and extension grossly.  Decreased ability to oppose the thumb to digits 4 and 5.  Shoulder flexion still with slight synergy pattern but uses functioallly at a diminshed level for most selfcare tasks.  Decreased internal rotation noted when attempting to reach behind his back to pull up pants or when reaching behind his head for washing his neck LUE Assessment LUE Assessment: Within Functional Limits   See Function Navigator for Current Functional Status.  Mckenzy Salazar OTR/L 11/10/2015, 12:48 PM

## 2015-11-10 NOTE — Progress Notes (Signed)
Subjective/Complaints:  No elbow pain today, voiding well ROS-  Denies CP,SOB, N/V/D.  Objective: Vital Signs: Blood pressure (!) 151/60, pulse 78, temperature 98.4 F (36.9 C), temperature source Oral, resp. rate 17, height 5' 8"  (1.727 m), weight 79.9 kg (176 lb 2.4 oz), SpO2 97 %. No results found. Results for orders placed or performed during the hospital encounter of 10/21/15 (from the past 72 hour(s))  Glucose, capillary     Status: Abnormal   Collection Time: 11/07/15 11:42 AM  Result Value Ref Range   Glucose-Capillary 112 (H) 65 - 99 mg/dL  Glucose, capillary     Status: Abnormal   Collection Time: 11/07/15  4:55 PM  Result Value Ref Range   Glucose-Capillary 101 (H) 65 - 99 mg/dL  Glucose, capillary     Status: Abnormal   Collection Time: 11/07/15  8:17 PM  Result Value Ref Range   Glucose-Capillary 208 (H) 65 - 99 mg/dL  Glucose, capillary     Status: Abnormal   Collection Time: 11/08/15  6:05 AM  Result Value Ref Range   Glucose-Capillary 147 (H) 65 - 99 mg/dL  Glucose, capillary     Status: Abnormal   Collection Time: 11/08/15 12:00 PM  Result Value Ref Range   Glucose-Capillary 145 (H) 65 - 99 mg/dL  Glucose, capillary     Status: None   Collection Time: 11/08/15  4:42 PM  Result Value Ref Range   Glucose-Capillary 85 65 - 99 mg/dL  Glucose, capillary     Status: Abnormal   Collection Time: 11/08/15  8:38 PM  Result Value Ref Range   Glucose-Capillary 136 (H) 65 - 99 mg/dL  Basic metabolic panel     Status: Abnormal   Collection Time: 11/09/15  5:21 AM  Result Value Ref Range   Sodium 137 135 - 145 mmol/L   Potassium 4.0 3.5 - 5.1 mmol/L   Chloride 105 101 - 111 mmol/L   CO2 25 22 - 32 mmol/L   Glucose, Bld 91 65 - 99 mg/dL   BUN 17 6 - 20 mg/dL   Creatinine, Ser 1.26 (H) 0.61 - 1.24 mg/dL   Calcium 8.9 8.9 - 10.3 mg/dL   GFR calc non Af Amer 50 (L) >60 mL/min   GFR calc Af Amer 58 (L) >60 mL/min    Comment: (NOTE) The eGFR has been calculated  using the CKD EPI equation. This calculation has not been validated in all clinical situations. eGFR's persistently <60 mL/min signify possible Chronic Kidney Disease.    Anion gap 7 5 - 15  CBC with Differential/Platelet     Status: Abnormal   Collection Time: 11/09/15  5:21 AM  Result Value Ref Range   WBC 7.4 4.0 - 10.5 K/uL   RBC 4.40 4.22 - 5.81 MIL/uL   Hemoglobin 13.1 13.0 - 17.0 g/dL   HCT 40.0 39.0 - 52.0 %   MCV 90.9 78.0 - 100.0 fL   MCH 29.8 26.0 - 34.0 pg   MCHC 32.8 30.0 - 36.0 g/dL   RDW 13.7 11.5 - 15.5 %   Platelets 217 150 - 400 K/uL   Neutrophils Relative % 73 %   Neutro Abs 5.3 1.7 - 7.7 K/uL   Lymphocytes Relative 11 %   Lymphs Abs 0.8 0.7 - 4.0 K/uL   Monocytes Relative 14 %   Monocytes Absolute 1.1 (H) 0.1 - 1.0 K/uL   Eosinophils Relative 2 %   Eosinophils Absolute 0.2 0.0 - 0.7 K/uL   Basophils Relative  0 %   Basophils Absolute 0.0 0.0 - 0.1 K/uL  Glucose, capillary     Status: Abnormal   Collection Time: 11/09/15  6:34 AM  Result Value Ref Range   Glucose-Capillary 103 (H) 65 - 99 mg/dL  Glucose, capillary     Status: Abnormal   Collection Time: 11/09/15 11:52 AM  Result Value Ref Range   Glucose-Capillary 269 (H) 65 - 99 mg/dL  Glucose, capillary     Status: Abnormal   Collection Time: 11/09/15  4:59 PM  Result Value Ref Range   Glucose-Capillary 288 (H) 65 - 99 mg/dL  Glucose, capillary     Status: Abnormal   Collection Time: 11/09/15  8:56 PM  Result Value Ref Range   Glucose-Capillary 304 (H) 65 - 99 mg/dL  Glucose, capillary     Status: None   Collection Time: 11/10/15  6:47 AM  Result Value Ref Range   Glucose-Capillary 69 65 - 99 mg/dL  Glucose, capillary     Status: Abnormal   Collection Time: 11/10/15  7:27 AM  Result Value Ref Range   Glucose-Capillary 121 (H) 65 - 99 mg/dL     HEENT: normocephalic, atraumatic.  Cardio: RRR and no murmur Resp: CTA B/L and unlabored GI: BS positive and NT, ND Skin:   Intact. Warm and  dry. Neuro: Alert. Right facial weakness.  Motor 4/5 R delt, bi, tri, grip. (?effort). Apraxia.  4/5 R HF, KE ,4+/5 ADF/PF (?effort) LUE/LLE: 4+/5 proximal to distal Dysarthria Musc/Skel:  No edema. No erythema. Right knee non tender no effusion, right elbow mild TTP, no edema, no erythema Gen: NAD. Vitals signs reviewed.    Assessment/Plan: 1. Functional deficits secondary to Left pontine infarct and new left posterior lateral temporal infarct which require 3+ hours per day of interdisciplinary therapy in a comprehensive inpatient rehab setting. Physiatrist is providing close team supervision and 24 hour management of active medical problems listed below. Physiatrist and rehab team continue to assess barriers to discharge/monitor patient progress toward functional and medical goals. FIM: Function - Bathing Position: Shower Body parts bathed by patient: Right arm, Left arm, Chest, Abdomen, Front perineal area, Buttocks, Right upper leg, Left upper leg, Left lower leg, Right lower leg, Back Body parts bathed by helper: Right lower leg, Back Bathing not applicable: Back Assist Level: Supervision or verbal cues  Function- Upper Body Dressing/Undressing What is the patient wearing?: Pull over shirt/dress Pull over shirt/dress - Perfomed by patient: Thread/unthread right sleeve, Thread/unthread left sleeve, Put head through opening, Pull shirt over trunk Pull over shirt/dress - Perfomed by helper: Pull shirt over trunk Assist Level: Set up Function - Lower Body Dressing/Undressing What is the patient wearing?: Pants, Shoes, [employer], Non-skid slipper socks Position: Wheelchair/chair at [employer] - Performed by patient: Thread/unthread left underwear leg, Pull underwear up/down, Thread/unthread right underwear leg Underwear - Performed by helper: Pull underwear up/down, Thread/unthread left underwear leg, Thread/unthread right underwear leg Pants- Performed by patient: Thread/unthread  right pants leg, Thread/unthread left pants leg, Pull pants up/down Pants- Performed by helper: Fasten/unfasten pants, Pull pants up/down, Thread/unthread right pants leg, Thread/unthread left pants leg Non-skid slipper socks- Performed by patient: Don/doff right sock, Don/doff left sock Non-skid slipper socks- Performed by helper: Don/doff right sock, Don/doff left sock Shoes - Performed by patient: Don/doff right shoe, Don/doff left shoe, Fasten left Shoes - Performed by helper: Fasten right TED Hose - Performed by helper: Don/doff right TED hose, Don/doff left TED hose Assist for footwear: Dependant Assist for  lower body dressing: Touching or steadying assistance (Pt > 75%)  Function - Toileting Toileting activity did not occur: Refused Toileting steps completed by patient: Adjust clothing prior to toileting, Performs perineal hygiene, Adjust clothing after toileting Toileting steps completed by helper: Adjust clothing prior to toileting, Performs perineal hygiene, Adjust clothing after toileting Toileting Assistive Devices: Grab bar or rail Assist level: Supervision or verbal cues  Function - Toilet Transfers Toilet transfer assistive device: Elevated toilet seat/BSC over toilet Assist level to toilet: Supervision or verbal cues Assist level from toilet: Supervision or verbal cues  Function - Chair/bed transfer Chair/bed transfer method: Ambulatory Chair/bed transfer assist level: Supervision or verbal cues Chair/bed transfer assistive device: Armrests, Walker Chair/bed transfer details: Verbal cues for precautions/safety, Verbal cues for safe use of DME/AE  Function - Locomotion: Wheelchair Will patient use wheelchair at discharge?: Yes Type: Manual Max wheelchair distance: 150 Assist Level: No help, No cues, assistive device, takes more than reasonable amount of time Assist Level: No help, No cues, assistive device, takes more than reasonable amount of time Assist Level: No  help, No cues, assistive device, takes more than reasonable amount of time Turns around,maneuvers to table,bed, and toilet,negotiates 3% grade,maneuvers on rugs and over doorsills: Yes Function - Locomotion: Ambulation Assistive device: Walker-rolling Max distance: 150 Assist level: Supervision or verbal cues Assist level: Supervision or verbal cues Walk 50 feet with 2 turns activity did not occur: Safety/medical concerns Assist level: Supervision or verbal cues Walk 150 feet activity did not occur: Safety/medical concerns Assist level: Supervision or verbal cues Walk 10 feet on uneven surfaces activity did not occur: Safety/medical concerns Assist level: Supervision or verbal cues  Function - Comprehension Comprehension: Auditory Comprehension assist level: Understands complex 90% of the time/cues 10% of the time  Function - Expression Expression: Verbal Expression assist level: Expresses complex 90% of the time/cues < 10% of the time  Function - Social Interaction Social Interaction assist level: Interacts appropriately with others with medication or extra time (anti-anxiety, antidepressant).  Function - Problem Solving Problem solving assist level: Solves basic 50 - 74% of the time/requires cueing 25 - 49% of the time  Function - Memory Memory assist level: Recognizes or recalls 50 - 74% of the time/requires cueing 25 - 49% of the time Patient normally able to recall (first 3 days only): Current season  Medical Problem List and Plan:   1.  Right hemiparesis dysarthria/dysphagia secondary to left pontine infarct on 10/14/15 and left posterior lateral temporal infarct 8/27  Cont CIR, PT, OT, SLP- tent D/C 9/13     2.  DVT Prophylaxis/Anticoagulation: Subcutaneous heparin changed to lovenox. Monitor platelet count and any signs of bleeding 3. Pain Management: Tylenol as needed, R knee pain, resolved, some swelling R 4th MCP, will rx volt gel  Now with pain in right elbow - likely  gout flare, prednisone burst. 42m po x 5 d 4. Hypertension.   Lopressor discontinued due to bradycardia.  Increased hydralazine 50 mg every 6 hours on 8/26, increased to 100 on 8/27  Norvasc 5 started 8/26  Goal SBP 130-150   Overall, in desired range at present Vitals:   11/10/15 0041 11/10/15 0547  BP: (!) 151/60   Pulse:    Resp:  17  Temp:  98.4 F (36.9 C)   5. Sinus arrest due to vagal spell. Beta blocker has been discontinued for now. No additional episodes. Follow-up per cardiology services. No plan at this time for pacemaker. 6. Neuropsych: This patient is  not fully capable of making decisions on his own behalf.  Started on Zoloft 8/27, changed to Prozaac on 8/28, d/ced 8/31 due to ?nausea 7. Skin/Wound Care: Routine skin check 8. Fluids/Electrolytes/Nutrition: Routine I&O's  9. Chronic renal insufficiency. Baseline creatinine 1.5. Today's value actually better than baseline 1.26  Cr. at baseline  Will cont to monitor 10. Diet-controlled diabetes mellitus. Hemoglobin A1c 7.3. Check blood sugars before meals and at bedtime. Diabetic teaching.   Now off prednisone   -diet changed to CM medium  -increased amaryl to 21m on 9/10, will reduce again to 262m -Will cont to monitor CBG (last 3)   Recent Labs  11/09/15 2056 11/10/15 0647 11/10/15 0727  GLUCAP 304* 69 121*  11. CAD/SVT. No chest pain or shortness of breath.  12. Gout.attack resolved  monitor for reoccurance 13. Hyperlipidemia. Pravachol  16. Urinary retention- Resolved   Off bethanechol  17. Nausea- improved 18. UTI  - UA neg except +WBCs, few bact, enterococcus >100K,  Amp day 6/7    LOS (Days) 20 A FACE TO FACE EVALUATION WAS PERFORMED  Arthor Gorter E 11/10/2015, 7:43 AM

## 2015-11-10 NOTE — Discharge Summary (Signed)
Discharge summary job [id number]

## 2015-11-10 NOTE — Progress Notes (Signed)
Speech Language Pathology Discharge Summary  Patient Details  Name: [name] MRN: [id number] Date of Birth: 14-Jul-1927  Today's Date: 11/10/2015 SLP Individual Time: 1103-1200 SLP Individual Time Calculation (min): 57 min    Skilled Therapeutic Interventions:  Pt was seen for skilled ST targeting goals for cognition, grad day activities, and completion of education prior to discharge.  Therapist administered remaining portions of the Mo[facility] basic to measure progress from initial evaluation.  Pt scored 17 out of 30 on assessment (n>/=26) which is indicative of persistent moderate cognitive impairment.  Suspect pt's impairments to be more mild when baseline level of function is taken into account from daughter's and pt's reports.  Therapist facilitated the session with skilled education regarding recommended diet textures given history of esophageal dysfunction, including foods to avoid.  Handout was provided to maximize carryover in the home environment.  SLP also discussed rationale for recommended swallowing precautions (alternating solids and liquids) to minimize instances of globus sensation occurring with solids.  Pt verbalized understanding.  Therapist also provided skilled education regarding memory compensatory strategies to facilitate functional independence for cognition during functional tasks.  Handout was provided.  Therapist reviewed and reinforced recommendations that pt have 24/7 supervision at discharge due to cognition and mobility impairments.  Pt in agreement.  Pt was returned to room and left in wheelchair with quick release belt donned and call bell within reach.  Pt is ready for discharge tomorrow.      Patient has met 8 of 8 long term goals.  Patient to discharge at overall Supervision;Min level.  Reasons goals not met:     Clinical Impression/Discharge Summary:  Pt has made functional gains while inpatient and is discharging having met 8 out of 8 long term goals.  Pt is  currently at an overall min assist-supervision level due to mild cognitive impairment s/p CVA characterized by decreased recall of new information, decreased anticipatory awareness, and decreased functional problem solving.  Pt is consuming dys 3 solids and thin liquids with mod I use of swallowing precautions.  Suspect pt is near his baseline for swallowing function due to prolonged history of esophageal dysfunction s/p tear several years ago.  Pt and family education is complete at this time.  Pt is discharging home with 24/7 supervision from family.     Care Partner:  Caregiver Able to Provide Assistance: Yes  Type of Caregiver Assistance: Physical;Cognitive  Recommendation:  Home Health SLP;24 hour supervision/assistance  Rationale for SLP Follow Up: Maximize cognitive function and independence;Reduce caregiver burden   Equipment: none recomended by SLP    Reasons for discharge: Discharged from hospital   Patient/Family Agrees with Progress Made and Goals Achieved: Yes   Function:  Eating Eating                 Cognition Comprehension Comprehension assist level: Understands complex 90% of the time/cues 10% of the time  Expression   Expression assist level: Expresses complex 90% of the time/cues < 10% of the time  Social Interaction Social Interaction assist level: Interacts appropriately with others with medication or extra time (anti-anxiety, antidepressant).  Problem Solving Problem solving assist level: Solves basic 75 - 89% of the time/requires cueing 10 - 24% of the time  Memory Memory assist level: Recognizes or recalls 75 - 89% of the time/requires cueing 10 - 24% of the time   Emilio Math 11/10/2015, 3:31 PM

## 2015-11-10 NOTE — Discharge Summary (Signed)
[name], [name] NO.:  [id number]  MEDICAL RECORD NO.:  [id number]  LOCATION:  4W23C                        FACILITY:  [facility]  PHYSICIAN:  Charlett Blake, M.D.DATE OF BIRTH:  Jul 02, 1927  DATE OF ADMISSION:  10/21/2015 DATE OF DISCHARGE:  11/11/2015                              DISCHARGE SUMMARY   DISCHARGE DIAGNOSES: 1. 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 2. Subcutaneous Lovenox for DVT prophylaxis. 3. Pain management. 4. Hypertension. 5. Sinus arrest due to vagal spell. 6. Chronic renal insufficiency with creatinine 1.5. 7. Diet-controlled diabetes mellitus. 8. Coronary artery disease with supraventricular tachycardia. 9. History of gout. 10.Hyperlipidemia. 11.Urinary retention, improved. 12.Enterococcus urinary tract infection, resolved.  HISTORY OF PRESENT ILLNESS:  This is an 82 year old right-handed male with history of CAD, maintained on aspirin, SVT, chronic renal insufficiency, creatinine 1.50, peripheral vascular disease.  Lives alone, independent, used a walker prior to admission.  The patient had stopped taking his aspirin on 08/13 in the anticipation of upcoming dental procedure.  MRI, CT, angiogram of the head and neck revealed acute left pontine infarct, generalized atrophy, moderate narrowing of midportion of basilar artery and multifocal moderate to severe narrowing of intracranial and internal carotid arteries, left greater than right. Carotid Doppler without significant stenosis.  Echocardiogram with ejection fraction of 123456, normal systolic function.  Neurology Services consulted, maintained on aspirin and Plavix therapy.  Subcutaneous heparin for DVT prophylaxis.  On the afternoon of October 20, 2015, after arrangements being made to admit to CIR, the patient while sitting up in chair became unresponsive with heart rate in the 40s, received metoprolol that morning.  EKG showed prolongation of the P-P  interval with resultant significant pause and heart rate 56.  Rapid response consulted, transferred to ICU.  It was felt his asystole likely vagal mediated and no acute indication for pacemaker at that time.  He was tolerating a regular diet.  He was later admitted for a comprehensive rehab program.  PAST MEDICAL HISTORY:  See discharge diagnoses.  SOCIAL HISTORY:  He lives alone, independent with a walker prior to admission.  FUNCTIONAL STATUS UPON ADMISSION TO REHAB SERVICES:  Minimal assist, 130 feet, rolling walker.  Minimal assist sit to supine.  Min to mod assist activities of daily living.  PHYSICAL EXAMINATION:  VITAL SIGNS:  Blood pressure 189/70, pulse 71, temperature 98, and respirations 19. GENERAL:  This was an alert male.  Speech was somewhat slurred, but intelligible.  He had some motor apraxia. LUNGS:  Clear to auscultation without wheeze. CARDIAC:  Regular rate and rhythm.  No murmur. ABDOMEN:  Soft and nontender.  Good bowel sounds.  REHABILITATION HOSPITAL COURSE:  The patient was admitted to Inpatient Rehab Services with therapies initiated on a 3-hour daily basis consisting of physical therapy, occupational therapy, speech therapy, and rehabilitation nursing.  The following issues were addressed during the patient's rehabilitation stay.  Pertaining to [name], left pontine infarct and left posterolateral temporal infarct remained stable, maintained on aspirin and Plavix therapy.  He would follow up with neurology Services.  Subcutaneous Lovenox for DVT prophylaxis, no bleeding episodes.  Blood pressures controlled and monitored on present regimen.  Noted sinus arrest due to vagal spell while in the hospital. Follow up with Cardiology Services.  No plan at this time for pacemaker. He would follow up outpatient for possible need for loop recorder. Chronic renal insufficiency, baseline creatinine 1.5, latest creatinine 1.26.  Blood sugars well controlled,  hemoglobin A1c 7.3.  He had received a dose and taper of prednisone for a gout flare-up with close monitoring of blood sugars.  He was on Amaryl, increased to 3 mg daily. He was completing a course of ampicillin for enterococcus UTI, remaining afebrile, no dysuria.  The patient received weekly collaborative interdisciplinary team conferences to discuss estimated length of stay, family teaching, any barriers to discharge.  The patient completed transfers, furniture transfers to low sofa, toilet transfers, simulated car transfers using rolling walker.  He could manipulate his wheelchair parts.  Gather belongings for activities of daily living and homemaking, supervision for all transfers to and from the toilet and shower using his rolling walker.  He was on a mechanical soft diet, followed by Speech Therapy.  He could communicate his needs without difficulty. Full family teaching was completed and plan discharge to home.  DISCHARGE MEDICATIONS: 1. Norvasc 10 mg p.o. daily. 2. Aspirin 325 mg p.o. daily. 3. Plavix 75 mg p.o. daily. 4. Voltaren gel 4 times daily to affected area. 5. Colace 100 mg b.i.d. 6. Pepcid 20 mg b.i.d. 7. Amaryl 2 mg daily. 8. Hydralazine 100 mg every 6 hours. 9. MiraLAX daily, hold for loose stool. 10.Pravachol 20 mg daily. 11.Prednisone taper as advised.  DIET:  His diet was mechanical soft diabetic restrictions.  FOLLOWUP:  The patient would follow up with Dr. Alysia Penna at the [facility] as advised; [name], neurology Services, call for appointment in 2 months; Dr. [name], Cardiology Services, followup appointment for loop recorder placement; Dr. Lajean Manes, medical management.     Lauraine Rinne, P.A.   ______________________________ Charlett Blake, M.D.    DA/MEDQ  D:  11/10/2015  T:  11/10/2015  Job:  [id number]  cc:   Charlett Blake, M.D. Dr. Wallace Keller T. Stoneking, M.D. Champ Mungo. Lovena Le, MD

## 2015-11-10 NOTE — Progress Notes (Signed)
Social Work  Discharge Note  The overall goal for the admission was met for:   Discharge location: Yes-HOME WITH DAUGHTER AND SON IN-LAW-24 HR  Length of Stay: Yes-21 DAYS  Discharge activity level: Yes-SUPERVISION/MIN ASSIST LEVEL  Home/community participation: Yes  Services provided included: MD, RD, PT, OT, SLP, RN, CM, TR, Pharmacy, Neuropsych and SW  Financial Services: Private Insurance: [facility]  Follow-up services arranged: Home Health: [facility]-PT,OT,SP,RN,AIDE, DME: [facility]-WHEELCHAIR, [place],. BEDSIDE COMMODE & TUB SEAT and Patient/Family has no preference for HH/DME agencies  Comments (or additional information):AWARE PT WILL NEED 24 HR CARE UPON DISCHARGE. PT DID WELL HERE AND IS READY TO GO HOME TODAY.  Patient/Family verbalized understanding of follow-up arrangements: Yes  Individual responsible for coordination of the follow-up plan: MARY JEAN-DAUGHTER  Confirmed correct DME delivered: Elease Hashimoto 11/10/2015    Elease Hashimoto

## 2015-11-10 NOTE — Progress Notes (Signed)
Social Work Patient ID: [name], male   DOB: [birth year], 82 y.o.   MRN: [id number]  Met with pt and daughter to discuss plan for tomorrow. Daughter has been through family education and feels comfortable with pt's care. Equipment to be delivered to room today and follow up arranged through [facility]. Pt is happy to be going home tomorrow and feels ready.

## 2015-11-10 NOTE — Consult Note (Signed)
NEUROBEHAVIORAL STATUS EXAM - [facility]   MEDICAL NECESSITY:  [name] was seen on the [facility] for a repeat neurobehavioral status exam owing to the patient's diagnosis of CVA, and to assist in treatment planning during admission.   Records indicate that [name] is an "82 y.o. male with history of Gout, CAD maintained on aspirin, SVT, CKD with creatinine 1.50, T2DM PVD with RLE claudication who was admitted on 10/14/15 with difficulty talking, difficulty moving RLE and difficulty with RUE coordination. Per chart review patient lives alone independently using a walker prior to admission.  Patient had stopped taking his ASA 8/13 in anticipation of upcoming dental procedure. UDS negative.  MRI brain/CT angiogram of head and neck done revealing acute left pontine infarct, generalized atrophy, moderate narrowing midportion of basilar artery and multifocal moderate to severe narrowing of intracranial internal carotid arteries L > R.Carotid dopplers without significant stenosis. .On the afternoon of 10/20/2015 after arrangements being made to be admitted to CIR patient while sitting up in chair became unresponsive with heart rate in the 40s. Patient had received metoprolol [that] morning. EKG showed prolongation of the P-P interval with resultant significant pause and heart rate 56. Rapid response contacted. He was transferred to ICU and cardiology consulted. It was felt asystole likely vagally mediated and no acute indication for pacemaker and cardiology signed off as patient had no additional episodes."  [name] was previously seen by this provider on 11/03/2015. Pertinent information from that visit is as follows:   Overall, [name] endorsed suffering from post-stroke cognitive issues and he did appear to exhibit at least slowed processing speed. .Emotionally, I think that the patient is suffering from a mild degree of depression in  reaction to the sudden loss of independence and physical functioning, though I do not feel it requires treatment at this point.  During today's visit, [name] continued to acknowledge experiencing multiple cognitive issues. He is unsure whether they are improving but does not feel as though they are worsening. He described his current mood as "alright" and denied any depressive or anxious symptomology. This has improved since last check. He mentioned that he has been working with SLP on his cognitive issues and is unsure whether it is helpful. Lots of time was spent providing information on stroke recovery and compensatory strategies he can utilize here and upon discharge. However, he will likely have trouble independently utilizing these strategies given his memory issues.   It is still the plan for [name] to discharge to his daughter's home where she will be able to provide 24-hour care and assistance. He is reportedly set to discharge this Wednesday. He is happy to be leaving though he does not want to have to rely and his family for too long; this is congruent with his reports from last week. Time was spent processing these thoughts.   PROCEDURES: [2 units 96116] Diagnostic clinical interview  Review of available records  MENTAL STATUS EXAM: APPEARANCE:  Normal/appropriate GEN:  Alert and oriented MOOD:  Euthymic         AFFECT:  Blunted  SPEECH:  Speech was slow with no other major issues either expressive or receptive     THOUGHT CONTENT:  Appropriate HALLUCINATIONS:  None INTELLIGENCE:  Average  INSIGHT:  Fair JUDGMENT:  Fair SUICIDAL IDEATION:  Denies SI   HOMICIDAL IDEATION:   Denies HI   IMPRESSION: Overall, [name] seemed in better spirits. He continues to suffer from  post-stroke cognitive deficits; probably quite significant. It is difficult to tell whether they are any better at this point. I recommend outpatient neuropsychological evaluation to assist in treatment planning  and to monitor recovery. Given the nature and severity of his cognitive and motor issues, he will likely require 24-hour care for the time being.    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

## 2015-11-10 NOTE — Progress Notes (Signed)
Hypoglycemic Event  CBG: 69  Treatment: 4 oz coke  Symptoms: None  Follow-up CBG: Time:0727 CBG Result:121  Possible Reasons for Event: Unknown  Comments/MD notified: Dr. Karalee Height, William Hamburger

## 2015-11-10 NOTE — Progress Notes (Signed)
Physical Therapy Discharge Summary  Patient Details  Name: [name] MRN: [id number] Date of Birth: [birth year]  Today's Date: 11/10/2015 PT Individual Time: 1300-1408 PT Individual Time Calculation (min): 68 min   Pt performed gait in home and controlled environments as well as ramp/curb negotiation and uneven surface gait with RW and supervision.  Stair negotiation x 12 stairs with bilat handrails with supervision. Bed mobility with supervision cuing for safety and sequencing. Berg balance test performed, pt scored 21/56, high fall risk, pt understands high fall risk and need for assistance with mobility.  W/c mobility in home and controlled environments with mod I using bilat LEs.  Nu step for LE strength and endurance x 5 minutes level 4.  Pt states he is "very ready" to d/c home tomorrow.  Patient has met 10 of 12 long term goals due to improved activity tolerance, improved balance, improved postural control, increased strength, ability to compensate for deficits and improved awareness.  Patient to discharge at an ambulatory level Supervision.   Patient's care partner is independent to provide the necessary physical and cognitive assistance at discharge.  Reasons goals not met: pt did not perform community gait, pt continues to require supervision for bed mobility  Recommendation:  Patient will benefit from ongoing skilled PT services in home health setting to continue to advance safe functional mobility, address ongoing impairments in strength, balance gait, and minimize fall risk.  Equipment: w/c, RW  Reasons for discharge: treatment goals met and discharge from hospital  Patient/family agrees with progress made and goals achieved: Yes  PT Discharge Precautions/RestrictionsPrecautions Precautions: Fall Precaution Comments: right hemiparesis Restrictions Weight Bearing Restrictions: No Pain Pain Assessment Pain Assessment: No/denies pain  Cognition Overall Cognitive Status:  Impaired/Different from baseline Arousal/Alertness: Awake/alert Orientation Level: Oriented X4 Attention: Selective Sustained Attention: Appears intact Selective Attention: Impaired Selective Attention Impairment: Functional basic;Functional complex Memory: Impaired Memory Impairment: Decreased recall of new information;Storage deficit Awareness: Impaired Awareness Impairment: Emergent impairment;Anticipatory impairment Problem Solving: Impaired Problem Solving Impairment: Functional basic Safety/Judgment: Impaired Comments: Pt still needs cueing for hand placement as well as sequencing with functional transfers and sit to stand with LB selfcare.  Also needs cueing if distracted with conversation to be able to maintain attention back to bathing or dressing tasks.  Sensation Sensation Light Touch: Appears Intact Stereognosis: Not tested Hot/Cold: Appears Intact Proprioception: Appears Intact Coordination Gross Motor Movements are Fluid and Coordinated: No Fine Motor Movements are Fluid and Coordinated: No Coordination and Movement Description: reduced LLE leg speed and excursion Heel Shin Test: reduced excursion and speed RLE Motor  Motor Motor: Hemiplegia   Trunk/Postural Assessment  Cervical Assessment Cervical Assessment: Exceptions to [facility] Cervical AROM Overall Cervical AROM Comments: Limited AROM in all directions Thoracic Assessment Thoracic Assessment: Exceptions to [facility] (rounded thoracic region) Lumbar Assessment Lumbar Assessment: Exceptions to [facility] (posterior pelvic tilt with limited lumbar mobility during selfcare tasks)  Balance Balance Balance Assessed: Yes Standardized Balance Assessment Standardized Balance Assessment: Berg Balance Test Berg Balance Test Sit to Stand: Able to stand using hands after several tries Standing Unsupported: Able to stand 2 minutes with supervision Sitting with Back Unsupported but Feet Supported on Floor or Stool: Able to sit  safely and securely 2 minutes Stand to Sit: Uses backs of legs against chair to control descent Transfers: Able to transfer safely, definite need of hands Standing Unsupported with Eyes Closed: Able to stand 10 seconds with supervision Standing Ubsupported with Feet Together: Needs help to attain position but able to stand for  30 seconds with feet together From Standing, Reach Forward with Outstretched Arm: Reaches forward but needs supervision From Standing Position, Pick up Object from Floor: Unable to pick up and needs supervision From Standing Position, Turn to Look Behind Over each Shoulder: Needs supervision when turning Turn 360 Degrees: Needs assistance while turning Standing Unsupported, Alternately Place Feet on Step/Stool: Needs assistance to keep from falling or unable to try Standing Unsupported, One Foot in Front: Loses balance while stepping or standing Standing on One Leg: Unable to try or needs assist to prevent fall Total Score: 21 Static Sitting Balance Static Sitting - Level of Assistance: 7: Independent Dynamic Sitting Balance Dynamic Sitting - Level of Assistance: 5: Stand by assistance Static Standing Balance Static Standing - Balance Support: No upper extremity supported Static Standing - Level of Assistance: 5: Stand by assistance Dynamic Standing Balance Dynamic Standing - Balance Support: Bilateral upper extremity supported Dynamic Standing - Level of Assistance: 5: Stand by assistance Extremity Assessment   RLE Assessment RLE Assessment: Within Functional Limits LLE Assessment LLE Assessment: Within Functional Limits   See Function Navigator for Current Functional Status.  Ranyia Witting 11/10/2015, 2:07 PM

## 2015-11-11 ENCOUNTER — Inpatient Hospital Stay (HOSPITAL_COMMUNITY): Payer: Medicare Other | Admitting: Speech Pathology

## 2015-11-11 LAB — GLUCOSE, CAPILLARY
GLUCOSE-CAPILLARY: 352 mg/dL — AB (ref 65–99)
GLUCOSE-CAPILLARY: 78 mg/dL (ref 65–99)
GLUCOSE-CAPILLARY: 181 mg/dL — AB (ref 65–99)

## 2015-11-11 MED ORDER — AMLODIPINE BESYLATE 10 MG PO TABS: 10.0000 mg | ORAL_TABLET | Freq: Every day | ORAL | 0 refills | Status: DC

## 2015-11-11 MED ORDER — HYDRALAZINE HCL 100 MG PO TABS: 100.0000 mg | ORAL_TABLET | Freq: Four times a day (QID) | ORAL | 0 refills | Status: DC

## 2015-11-11 MED ORDER — FAMOTIDINE 20 MG PO TABS: 20.0000 mg | ORAL_TABLET | Freq: Two times a day (BID) | ORAL | 0 refills | Status: DC

## 2015-11-11 MED ORDER — PRAVASTATIN SODIUM 20 MG PO TABS: 20.0000 mg | ORAL_TABLET | Freq: Every day | ORAL | 1 refills | Status: DC

## 2015-11-11 MED ORDER — PREDNISONE 20 MG PO TABS: 20.0000 mg | ORAL_TABLET | Freq: Every day | ORAL | 0 refills | Status: DC

## 2015-11-11 MED ORDER — GLIMEPIRIDE 2 MG PO TABS: 2.0000 mg | ORAL_TABLET | Freq: Every day | ORAL | 1 refills | Status: DC

## 2015-11-11 MED ORDER — CLOPIDOGREL BISULFATE 75 MG PO TABS: 75.0000 mg | ORAL_TABLET | Freq: Every day | ORAL | 1 refills | Status: DC

## 2015-11-11 MED ORDER — DICLOFENAC SODIUM 1 % TD GEL: 2.0000 g | Freq: Four times a day (QID) | TRANSDERMAL | 1 refills | Status: DC

## 2015-11-11 MED ORDER — POLYETHYLENE GLYCOL 3350 17 G PO PACK: 17.0000 g | PACK | Freq: Two times a day (BID) | ORAL | 0 refills | Status: DC

## 2015-11-11 NOTE — Progress Notes (Signed)
Subjective/Complaints:  No elbow pain today,feels ready for d/c ROS-  Denies CP,SOB, N/V/D.  Objective: Vital Signs: Blood pressure (!) 176/74, pulse 82, temperature 98.1 F (36.7 C), temperature source Oral, resp. rate 18, height 5' 8"  (1.727 m), weight 78.5 kg (173 lb 1 oz), SpO2 98 %. No results found. Results for orders placed or performed during the hospital encounter of 10/21/15 (from the past 72 hour(s))  Glucose, capillary     Status: Abnormal   Collection Time: 11/08/15 12:00 PM  Result Value Ref Range   Glucose-Capillary 145 (H) 65 - 99 mg/dL  Glucose, capillary     Status: None   Collection Time: 11/08/15  4:42 PM  Result Value Ref Range   Glucose-Capillary 85 65 - 99 mg/dL  Glucose, capillary     Status: Abnormal   Collection Time: 11/08/15  8:38 PM  Result Value Ref Range   Glucose-Capillary 136 (H) 65 - 99 mg/dL  Basic metabolic panel     Status: Abnormal   Collection Time: 11/09/15  5:21 AM  Result Value Ref Range   Sodium 137 135 - 145 mmol/L   Potassium 4.0 3.5 - 5.1 mmol/L   Chloride 105 101 - 111 mmol/L   CO2 25 22 - 32 mmol/L   Glucose, Bld 91 65 - 99 mg/dL   BUN 17 6 - 20 mg/dL   Creatinine, Ser 1.26 (H) 0.61 - 1.24 mg/dL   Calcium 8.9 8.9 - 10.3 mg/dL   GFR calc non Af Amer 50 (L) >60 mL/min   GFR calc Af Amer 58 (L) >60 mL/min    Comment: (NOTE) The eGFR has been calculated using the CKD EPI equation. This calculation has not been validated in all clinical situations. eGFR's persistently <60 mL/min signify possible Chronic Kidney Disease.    Anion gap 7 5 - 15  CBC with Differential/Platelet     Status: Abnormal   Collection Time: 11/09/15  5:21 AM  Result Value Ref Range   WBC 7.4 4.0 - 10.5 K/uL   RBC 4.40 4.22 - 5.81 MIL/uL   Hemoglobin 13.1 13.0 - 17.0 g/dL   HCT 40.0 39.0 - 52.0 %   MCV 90.9 78.0 - 100.0 fL   MCH 29.8 26.0 - 34.0 pg   MCHC 32.8 30.0 - 36.0 g/dL   RDW 13.7 11.5 - 15.5 %   Platelets 217 150 - 400 K/uL   Neutrophils  Relative % 73 %   Neutro Abs 5.3 1.7 - 7.7 K/uL   Lymphocytes Relative 11 %   Lymphs Abs 0.8 0.7 - 4.0 K/uL   Monocytes Relative 14 %   Monocytes Absolute 1.1 (H) 0.1 - 1.0 K/uL   Eosinophils Relative 2 %   Eosinophils Absolute 0.2 0.0 - 0.7 K/uL   Basophils Relative 0 %   Basophils Absolute 0.0 0.0 - 0.1 K/uL  Glucose, capillary     Status: Abnormal   Collection Time: 11/09/15  6:34 AM  Result Value Ref Range   Glucose-Capillary 103 (H) 65 - 99 mg/dL  Glucose, capillary     Status: Abnormal   Collection Time: 11/09/15 11:52 AM  Result Value Ref Range   Glucose-Capillary 269 (H) 65 - 99 mg/dL  Glucose, capillary     Status: Abnormal   Collection Time: 11/09/15  4:59 PM  Result Value Ref Range   Glucose-Capillary 288 (H) 65 - 99 mg/dL  Glucose, capillary     Status: Abnormal   Collection Time: 11/09/15  8:56 PM  Result  Value Ref Range   Glucose-Capillary 304 (H) 65 - 99 mg/dL  Glucose, capillary     Status: None   Collection Time: 11/10/15  6:47 AM  Result Value Ref Range   Glucose-Capillary 69 65 - 99 mg/dL  Glucose, capillary     Status: Abnormal   Collection Time: 11/10/15  7:27 AM  Result Value Ref Range   Glucose-Capillary 121 (H) 65 - 99 mg/dL  Glucose, capillary     Status: Abnormal   Collection Time: 11/10/15 12:23 PM  Result Value Ref Range   Glucose-Capillary 148 (H) 65 - 99 mg/dL  Glucose, capillary     Status: Abnormal   Collection Time: 11/10/15  8:58 PM  Result Value Ref Range   Glucose-Capillary 233 (H) 65 - 99 mg/dL  Glucose, capillary     Status: None   Collection Time: 11/11/15  6:56 AM  Result Value Ref Range   Glucose-Capillary 78 65 - 99 mg/dL     HEENT: normocephalic, atraumatic.  Cardio: RRR and no murmur Resp: CTA B/L and unlabored GI: BS positive and NT, ND Skin:   Intact. Warm and dry. Neuro: Alert. Right facial weakness.  Motor 4/5 R delt, bi, tri, grip. (?effort). Apraxia.  4/5 R HF, KE ,4+/5 ADF/PF (?effort) LUE/LLE: 4+/5 proximal to  distal Dysarthria Musc/Skel:  No edema. No erythema. Right knee non tender no effusion, right elbow mild TTP, no edema, no erythema Gen: NAD. Vitals signs reviewed.    Assessment/Plan: 1. Functional deficits secondary to Left pontine infarct and new left posterior lateral temporal infarct which require 3+ hours per day of interdisciplinary therapy in a comprehensive inpatient rehab setting. Physiatrist is providing close team supervision and 24 hour management of active medical problems listed below. Physiatrist and rehab team continue to assess barriers to discharge/monitor patient progress toward functional and medical goals. FIM: Function - Bathing Position: Shower Body parts bathed by patient: Right arm, Left arm, Chest, Abdomen, Front perineal area, Buttocks, Right upper leg, Left upper leg, Left lower leg, Right lower leg, Back Body parts bathed by helper: Right lower leg, Back Bathing not applicable: Back Assist Level: Supervision or verbal cues  Function- Upper Body Dressing/Undressing What is the patient wearing?: Pull over shirt/dress Pull over shirt/dress - Perfomed by patient: Thread/unthread right sleeve, Thread/unthread left sleeve, Put head through opening, Pull shirt over trunk Pull over shirt/dress - Perfomed by helper: Pull shirt over trunk Assist Level: Supervision or verbal cues Function - Lower Body Dressing/Undressing What is the patient wearing?: Underwear, Pants, Maryln Manuel, Shoes Position: Education officer, museum at [employer] - Performed by patient: Thread/unthread left underwear leg, Pull underwear up/down, Thread/unthread right underwear leg Underwear - Performed by helper: Pull underwear up/down, Thread/unthread left underwear leg, Thread/unthread right underwear leg Pants- Performed by patient: Thread/unthread right pants leg, Thread/unthread left pants leg, Pull pants up/down Pants- Performed by helper: Fasten/unfasten pants, Pull pants up/down, Thread/unthread  right pants leg, Thread/unthread left pants leg Non-skid slipper socks- Performed by patient: Don/doff right sock, Don/doff left sock Non-skid slipper socks- Performed by helper: Don/doff right sock, Don/doff left sock Shoes - Performed by patient: Don/doff right shoe, Don/doff left shoe, Fasten left, Fasten right Shoes - Performed by helper: Fasten right TED Hose - Performed by helper: Don/doff right TED hose, Don/doff left TED hose Assist for footwear: Dependant Assist for lower body dressing: Supervision or verbal cues  Function - Toileting Toileting activity did not occur: Refused Toileting steps completed by patient: Adjust clothing prior to toileting, Performs perineal  hygiene, Adjust clothing after toileting Toileting steps completed by helper: Adjust clothing prior to toileting, Performs perineal hygiene, Adjust clothing after toileting Toileting Assistive Devices: Grab bar or rail Assist level: Supervision or verbal cues  Function - Toilet Transfers Toilet transfer assistive device: Elevated toilet seat/BSC over toilet Assist level to toilet: Supervision or verbal cues Assist level from toilet: Supervision or verbal cues  Function - Chair/bed transfer Chair/bed transfer method: Ambulatory Chair/bed transfer assist level: Supervision or verbal cues Chair/bed transfer assistive device: Walker Chair/bed transfer details: Verbal cues for precautions/safety, Verbal cues for safe use of DME/AE  Function - Locomotion: Wheelchair Will patient use wheelchair at discharge?: Yes Type: Manual Max wheelchair distance: 150 Assist Level: No help, No cues, assistive device, takes more than reasonable amount of time Assist Level: No help, No cues, assistive device, takes more than reasonable amount of time Assist Level: No help, No cues, assistive device, takes more than reasonable amount of time Turns around,maneuvers to table,bed, and toilet,negotiates 3% grade,maneuvers on rugs and over  doorsills: Yes Function - Locomotion: Ambulation Assistive device: Walker-rolling Max distance: 200 Assist level: Supervision or verbal cues Assist level: Supervision or verbal cues Walk 50 feet with 2 turns activity did not occur: Safety/medical concerns Assist level: Supervision or verbal cues Walk 150 feet activity did not occur: Safety/medical concerns Assist level: Supervision or verbal cues Walk 10 feet on uneven surfaces activity did not occur: Safety/medical concerns Assist level: Supervision or verbal cues  Function - Comprehension Comprehension: Auditory Comprehension assist level: Understands complex 90% of the time/cues 10% of the time  Function - Expression Expression: Verbal Expression assist level: Expresses complex 90% of the time/cues < 10% of the time  Function - Social Interaction Social Interaction assist level: Interacts appropriately with others with medication or extra time (anti-anxiety, antidepressant).  Function - Problem Solving Problem solving assist level: Solves basic 75 - 89% of the time/requires cueing 10 - 24% of the time  Function - Memory Memory assist level: Recognizes or recalls 75 - 89% of the time/requires cueing 10 - 24% of the time Patient normally able to recall (first 3 days only): Current season  Medical Problem List and Plan:   1.  Right hemiparesis dysarthria/dysphagia secondary to left pontine infarct on 10/14/15 and left posterior lateral temporal infarct 8/27  Cont CIR, PT, OT, SLP- D/C 9/13     2.  DVT Prophylaxis/Anticoagulation: Subcutaneous heparin changed to lovenox. Monitor platelet count and any signs of bleeding 3. Pain Management: Tylenol as needed, R knee pain, resolved, some swelling R 4th MCP, will rx volt gel   4. Hypertension.   Lopressor discontinued due to bradycardia.  Increased hydralazine 50 mg every 6 hours on 8/26, increased to 100 on 8/27  Norvasc 5 started 8/26  Goal SBP 130-150   Overall, in desired  range at present Vitals:   11/10/15 2328 11/11/15 0549  BP: (!) 144/53 (!) 176/74  Pulse:  82  Resp:  18  Temp:  98.1 F (36.7 C)   5. Sinus arrest due to vagal spell. Beta blocker has been discontinued for now. No additional episodes. Follow-up per cardiology services. No plan at this time for pacemaker. 6. Neuropsych: This patient is not fully capable of making decisions on his own behalf.  Started on Zoloft 8/27, changed to Prozaac on 8/28, d/ced 8/31 due to ?nausea 7. Skin/Wound Care: Routine skin check 8. Fluids/Electrolytes/Nutrition: Routine I&O's  9. Chronic renal insufficiency. Cr. at baseline  Will cont to monitor 10.  Diet-controlled diabetes mellitus. Hemoglobin A1c 7.3. Check blood sugars before meals and at bedtime. Diabetic teaching.   Now off prednisone   -diet changed to CM medium  -increased amaryl to 4m on 9/10, will reduce again to 220m -Will cont to monitor CBG (last 3)   Recent Labs  11/10/15 1223 11/10/15 2058 11/11/15 0656  GLUCAP 148* 233* 78  11. CAD/SVT. No chest pain or shortness of breath.  12. Gout.attack resolved  monitor for reoccurance 13. Hyperlipidemia. Pravachol  16. Urinary retention- Resolved   Off bethanechol  17. Nausea- improved 18. UTI  - UA neg except +WBCs, few bact, enterococcus >100K,  Amp day 6/7    LOS (Days) 21 A FACE TO FACE EVALUATION WAS PERFORMED  Nell Bark E 11/11/2015, 9:04 AM

## 2015-11-11 NOTE — Progress Notes (Signed)
Patient discharged home.  Left floor via wheelchair, escorted by nursing staff and family.  All patient belongings sent with patient, including DME.  Patient and daughter verbalized understanding of discharge instructions as given by Marlowe Shores, PA.  Appears to be in no immediate distress at this time.  Brita Romp, RN

## 2015-11-12 ENCOUNTER — Telehealth: Payer: Self-pay

## 2015-11-12 ENCOUNTER — Telehealth: Payer: Self-pay | Admitting: Physical Medicine & Rehabilitation

## 2015-11-12 NOTE — Telephone Encounter (Signed)
Patient was discharged from home but did not get a prescription for Plavix.  This needs to go to [facility] [place].  Please call Karle Starch with this is done at [phone].

## 2015-11-12 NOTE — Telephone Encounter (Signed)
1. Are you/is patient experiencing any problems since coming home? Are there any questions regarding any aspect of care? 2. Are there any questions regarding medications administration/dosing? Are meds being taken as prescribed? Patient should review meds with caller to confirm 3. Have there been any falls? 4. Has Home Health been to the house and/or have they contacted you? If not, have you tried to contact them? Can we help you contact them? 5. Are bowels and bladder emptying properly? Are there any unexpected incontinence issues? If applicable, is patient following bowel/bladder programs? 6. Any fevers, problems with breathing, unexpected pain? 7. Are there any skin problems or new areas of breakdown? 8. Has the patient/family member arranged specialty MD follow up (ie cardiology/neurology/renal/surgical/etc)?  Can we help arrange? 9. Does the patient need any other services or support that we can help arrange? 10. Are caregivers following through as expected in assisting the patient? 11. Has the patient quit smoking, drinking alcohol, or using drugs as recommended?  

## 2015-11-12 NOTE — Telephone Encounter (Signed)
It was sent to the pharmacy by Linna Hoff and Karle Starch notified.

## 2015-11-18 NOTE — Telephone Encounter (Signed)
Pt is requesting a call after 4:00 pm.

## 2015-11-20 ENCOUNTER — Ambulatory Visit (INDEPENDENT_AMBULATORY_CARE_PROVIDER_SITE_OTHER): Payer: Medicare Other | Admitting: Nurse Practitioner

## 2015-11-20 ENCOUNTER — Encounter: Payer: Self-pay | Admitting: Nurse Practitioner

## 2015-11-20 VITALS — BP 143/63 | HR 88 | Ht 68.0 in | Wt 170.6 lb

## 2015-11-20 DIAGNOSIS — N189 Chronic kidney disease, unspecified: Secondary | ICD-10-CM

## 2015-11-20 DIAGNOSIS — E785 Hyperlipidemia, unspecified: Secondary | ICD-10-CM

## 2015-11-20 DIAGNOSIS — I639 Cerebral infarction, unspecified: Secondary | ICD-10-CM

## 2015-11-20 DIAGNOSIS — G8191 Hemiplegia, unspecified affecting right dominant side: Secondary | ICD-10-CM

## 2015-11-20 DIAGNOSIS — I6350 Cerebral infarction due to unspecified occlusion or stenosis of unspecified cerebral artery: Secondary | ICD-10-CM

## 2015-11-20 DIAGNOSIS — M6289 Other specified disorders of muscle: Secondary | ICD-10-CM

## 2015-11-20 DIAGNOSIS — R531 Weakness: Secondary | ICD-10-CM

## 2015-11-20 DIAGNOSIS — I63512 Cerebral infarction due to unspecified occlusion or stenosis of left middle cerebral artery: Secondary | ICD-10-CM | POA: Diagnosis not present

## 2015-11-20 NOTE — Progress Notes (Signed)
[facility]  PATIENT: [name] DOB: Aug 21, 1927   REASON FOR VISIT: Hospital follow-up for acute left MCA cortical infarct and subacute left pontine infarct secondary to small vessel disease versus cardioembolic HISTORY FROM: Patient and daughter [name]    HISTORY OF PRESENT ILLNESS:This is an 82-yo RH man who presented to [facility] ED  10/14/15 after experiencing difficulty speaking for the past 2-3 days. History is obtained directly from the patient who is an excellent historian. He reports that approximately 3 days ago, he noticed that he was having some difficulty with his speech. He describes that he has beenstumbling over words and at times cannot get them out appropriately. His symptoms have remained stable since he first noticed them. He initially denied any additional symptoms. However, further in conversation, he recalled that he was having difficulty moving his right leg from the gas to the brake when he was driving 2 days ago. In the emergency department physician's note it was mentioned that his PCP felt he was favoring his right leg while walking. The patient tells that this has been a long-standing issue for the past several years and he does not feel like his walking has been any different over the past couple of days. The only other symptom that he reports is a crawling sensation behind his left ear that extends into the left occipital region. This is intermittent and appeared a few days before his speech deficits. He denies any headache or pain in his neck. He denies any vision loss, double vision, vertigo, hearing changes, difficulty swallowing, numbness, weakness, balance problems, or gait changes. He had no intention of coming to the hospital to be evaluated. However, he states that this morning a family member felt like his lips looked blue so they took him to his primary care physician. During this visit, his PCP noted that he was having difficulty with  his speech and sent him to the emergency department. Her note, the patient typically takes a baby aspirin every day. However, he stopped taking this on 10/11/15 due to some upcoming dental work. Patient was not considered for IV t-PA secondary to unclear onset. He was admitted for further evaluation and treatment.Pt has chronic right LE weakness. Still has right facial droop and mild dysarthria. Hx of SVT and treated with IV injection and stated that Valsalva could stop the episode. He used to follow up with Dr. Tamala Julian in cardiology several years ago. Had hx of PE but only once. Denies DVT hx. MRI of the brain with acute left MCA small cortical infarct and subacute left pontine infarct secondary to small vessel disease versus cardioembolic. MRA with moderate mild BA left greater than right narrowing  intracranial arteries. Carotid Doppler without significant stenosis. 2-D echo 60-65%. Not a candidate for TEE due to history of esophagus seizure and repair. LDL 141 not on statin. Hemoglobin A1c 7.3 not on medication. On  follow-up visit to the stroke clinic today, patient denies further stroke or TIA symptoms. He is currently getting physical therapy in the  home setting and is due to get occupational therapy and speech therapy. He was in inpatient rehabilitation for 2 weeks and is now back home with the  daughter . He is due to see cardiology next week for loop recorder. He is on a soft diet. He returns for reevaluation  REVIEW OF SYSTEMS: Full 14 system review of systems performed and notable only for those listed, all others are neg:  Constitutional: neg  Cardiovascular:  neg Ear/Nose/Throat: neg  Skin: neg Eyes: neg Respiratory: neg Gastroitestinal: Nausea Hematology/Lymphatic: neg  Endocrine: neg Musculoskeletal:neg Allergy/Immunology: neg Neurological: Dizziness Psychiatric: neg Sleep : neg   ALLERGIES: Allergies  Allergen Reactions  . Lipitor [Atorvastatin]     Muscle pain   . Zetia  [Ezetimibe] Hives    HOME MEDICATIONS: Outpatient Medications Prior to Visit  Medication Sig Dispense Refill  . amLODipine (NORVASC) 10 MG tablet Take 1 tablet (10 mg total) by mouth daily. 30 tablet 0  . aspirin 325 MG tablet Take 1 tablet (325 mg total) by mouth daily.    . clopidogrel (PLAVIX) 75 MG tablet Take 1 tablet (75 mg total) by mouth daily. 30 tablet 1  . diclofenac sodium (VOLTAREN) 1 % GEL Apply 2 g topically 4 (four) times daily. 1 Tube 1  . famotidine (PEPCID) 20 MG tablet Take 1 tablet (20 mg total) by mouth 2 (two) times daily. 60 tablet 0  . glimepiride (AMARYL) 2 MG tablet Take 1 tablet (2 mg total) by mouth daily with breakfast. 30 tablet 1  . hydrALAZINE (APRESOLINE) 100 MG tablet Take 1 tablet (100 mg total) by mouth every 6 (six) hours. 180 tablet 0  . polyethylene glycol (MIRALAX / GLYCOLAX) packet Take 17 g by mouth 2 (two) times daily. 14 each 0  . pravastatin (PRAVACHOL) 20 MG tablet Take 1 tablet (20 mg total) by mouth daily at 6 PM. 30 tablet 1  . predniSONE (DELTASONE) 20 MG tablet Take 1 tablet (20 mg total) by mouth daily with breakfast. (Patient not taking: Reported on 11/20/2015) 3 tablet 0   No facility-administered medications prior to visit.     PAST MEDICAL HISTORY: Past Medical History:  Diagnosis Date  . Adenomatous polyp   . CAD (coronary artery disease)   . Chronic kidney disease   . Diverticulosis   . DVT (deep venous thrombosis) ([place])   . Gout   . History of pulmonary embolus (PE)    from a DVT in 2008  . Hyperglycemia   . Hyperlipidemia   . Hypertension   . Mallory-Weiss tear   . Peripheral vascular disease ([place])   . SVT (supraventricular tachycardia) ([place])   . Vitamin B12 deficiency     PAST SURGICAL HISTORY: Past Surgical History:  Procedure Laterality Date  .  PTCA of coronary lesion Right 1995  . APPENDECTOMY    . PR VEIN BYPASS GRAFT,AORTO-FEM-POP Left    femoral-popliteal BP by Dr. Drucie Opitz  . repair of aal fissure    1978    FAMILY HISTORY: Family History  Problem Relation Age of Onset  . Cancer Mother     bladder  . CAD Father   . Heart attack Father     SOCIAL HISTORY: Social History   Social History  . Marital status: Widowed    Spouse name: N/A  . Number of children: N/A  . Years of education: N/A   Occupational History  . Not on file.   Social History Main Topics  . Smoking status: Never Smoker  . Smokeless tobacco: Never Used  . Alcohol use No  . Drug use: No  . Sexual activity: Not on file   Other Topics Concern  . Not on file   Social History Narrative  . No narrative on file     PHYSICAL EXAM  Vitals:   11/20/15 0904  BP: (!) 143/63  Pulse: 88  Weight: 170 lb 9.6 oz (77.4 kg)  Height: 5\' 8"  (1.727 m)  Body mass index is 25.94 kg/m.  Generalized: Well developed, in no acute distress  Head: normocephalic and atraumatic,. Oropharynx benign  Neck: Supple, no carotid bruits  Cardiac: Regular rate rhythm, no murmur  Musculoskeletal: No deformity   Neurological examination   Mentation: Alert oriented to time, place, history taking. Attention span and concentration appropriate. Recent and remote memory intact.  Follows all commands speech mild dysarthria.   Cranial nerve II-XII: Fundoscopic exam reveals sharp disc margins.Pupils were equal round reactive to light extraocular movements were full, visual field were full on confrontational test. Mild Facial weakness on the right  Hearing was intact to finger rubbing bilaterally. Uvula tongue midline. head turning and shoulder shrug were normal and symmetric.Tongue protrusion into cheek strength was normal. Motor: normal bulk and tone, full strength in the BUE, BLE, except right lower extremity proximal weakness 4/5.  Sensory: normal and symmetric to light touch, pinprick, and  Vibration, in the upper and lower extremities Coordination: finger-nose-finger, heel-to-shin bilaterally, no dysmetria on the left clumsiness  with finger-to-nose on the right. Reflexes: 1+ upper lower and symmetric plantar responses were flexor bilaterally. Gait and Station: In wheelchair not ambulated  DIAGNOSTIC DATA (LABS, IMAGING, TESTING) - I reviewed patient records, labs, notes, testing and imaging myself where available.  Lab Results  Component Value Date   WBC 7.4 11/09/2015   HGB 13.1 11/09/2015   HCT 40.0 11/09/2015   MCV 90.9 11/09/2015   PLT 217 11/09/2015      Component Value Date/Time   NA 137 11/09/2015 0521   K 4.0 11/09/2015 0521   CL 105 11/09/2015 0521   CO2 25 11/09/2015 0521   GLUCOSE 91 11/09/2015 0521   BUN 17 11/09/2015 0521   CREATININE 1.26 (H) 11/09/2015 0521   CALCIUM 8.9 11/09/2015 0521   PROT 6.6 10/26/2015 0528   ALBUMIN 3.2 (L) 10/26/2015 0528   AST 35 10/26/2015 0528   ALT 62 10/26/2015 0528   ALKPHOS 44 10/26/2015 0528   BILITOT 0.6 10/26/2015 0528   GFRNONAA 50 (L) 11/09/2015 0521   GFRAA 58 (L) 11/09/2015 0521   Lab Results  Component Value Date   CHOL 207 (H) 10/17/2015   HDL 34 (L) 10/17/2015   LDLCALC 141 (H) 10/17/2015   TRIG 159 (H) 10/17/2015   CHOLHDL 6.1 10/17/2015   Lab Results  Component Value Date   HGBA1C 7.3 (H) 10/17/2015   No results found for: [id number] Lab Results  Component Value Date   TSH 1.852 10/21/2015      ASSESSMENT AND PLAN  82 y.o. year old male  has a past medical history of CAD (coronary artery disease); Chronic kidney disease; Diverticulosis; DVT (deep venous thrombosis) ([place]); Gout; History of pulmonary embolus (PE); Hyperglycemia; Hyperlipidemia; Hypertension; Mallory-Weiss tear; Peripheral vascular disease ([place]); SVT (supraventricular tachycardia) ([place]); and Vitamin B12 deficiency.Recent stroke MRI of the brain with acute left MCA small cortical infarct and subacute left pontine infarct secondary to small vessel disease versus cardioembolic. MRA with moderate mild BA left greater than right narrowing  intracranial arteries. Carotid  Doppler without significant stenosis. 2-D echo 60-65%. Not a candidate for TEE due to history of esophagus seizure and repair. LDL 141 not on statin. Hemoglobin A1c 7.3 not on medication.  PLANStressed the importance of management of risk factors to prevent further stroke Continue Aspirin and Plavix for secondary stroke prevention, discontinue aspirin after 3 months Maintain strict control of hypertension with blood pressure goal below 130/90, today's reading 143/63 continue antihypertensive medications Control of diabetes  with hemoglobin A1c below 6.5 followed by primary care most recent hemoglobin A1c 7.3 continue diabetic medications Cholesterol with LDL cholesterol less than 70, followed by primary care,  most recent 141 continue statin drugs Continue therapies Diet  whole grains,  fresh fruits and vegetables, low fat diet Discussed risk for recurrent stroke/ TIA and answered additional questions This was a prolonged visit requiring 45 minutes and medical decision making of high complexity with extensive review of history, hospital chart, counseling and answering questions Dennie Bible, [facility], [facility], APRN  [facility] [address], [place], [place] [address] [phone]

## 2015-11-20 NOTE — Patient Instructions (Addendum)
Stressed the importance of management of risk factors to prevent further stroke Continue Aspirin and Plavix for secondary stroke prevention, discontinue aspirin after 3 months Maintain strict control of hypertension with blood pressure goal below 130/90, today's reading 143/63 continue antihypertensive medications Control of diabetes with hemoglobin A1c below 6.5 followed by primary care most recent hemoglobin A1c 7.3 continue diabetic medications Cholesterol with LDL cholesterol less than 70, followed by primary care,  most recent 141 continue statin drugs Continue therapies Diet  whole grains,  fresh fruits and vegetables, low fat diet Discussed risk for recurrent stroke/ TIA and answered additional questions

## 2015-11-20 NOTE — Progress Notes (Signed)
I reviewed above note and agree with the assessment and plan.  Rosalin Hawking, MD PhD Stroke Neurology 11/20/2015 2:59 PM

## 2015-11-23 ENCOUNTER — Ambulatory Visit (HOSPITAL_BASED_OUTPATIENT_CLINIC_OR_DEPARTMENT_OTHER): Payer: Medicare Other | Admitting: Physical Medicine & Rehabilitation

## 2015-11-23 ENCOUNTER — Encounter: Payer: Medicare Other | Attending: Physical Medicine & Rehabilitation

## 2015-11-23 ENCOUNTER — Telehealth: Payer: Self-pay | Admitting: Nurse Practitioner

## 2015-11-23 ENCOUNTER — Encounter: Payer: Self-pay | Admitting: Physical Medicine & Rehabilitation

## 2015-11-23 VITALS — BP 133/67 | HR 94 | Resp 16

## 2015-11-23 DIAGNOSIS — I69398 Other sequelae of cerebral infarction: Secondary | ICD-10-CM | POA: Diagnosis not present

## 2015-11-23 DIAGNOSIS — R110 Nausea: Secondary | ICD-10-CM | POA: Diagnosis not present

## 2015-11-23 DIAGNOSIS — Z7982 Long term (current) use of aspirin: Secondary | ICD-10-CM | POA: Insufficient documentation

## 2015-11-23 DIAGNOSIS — I639 Cerebral infarction, unspecified: Secondary | ICD-10-CM

## 2015-11-23 DIAGNOSIS — R4586 Emotional lability: Secondary | ICD-10-CM | POA: Insufficient documentation

## 2015-11-23 DIAGNOSIS — I2510 Atherosclerotic heart disease of native coronary artery without angina pectoris: Secondary | ICD-10-CM | POA: Insufficient documentation

## 2015-11-23 DIAGNOSIS — N319 Neuromuscular dysfunction of bladder, unspecified: Secondary | ICD-10-CM | POA: Diagnosis not present

## 2015-11-23 DIAGNOSIS — I6350 Cerebral infarction due to unspecified occlusion or stenosis of unspecified cerebral artery: Secondary | ICD-10-CM | POA: Diagnosis not present

## 2015-11-23 DIAGNOSIS — R001 Bradycardia, unspecified: Secondary | ICD-10-CM | POA: Diagnosis not present

## 2015-11-23 DIAGNOSIS — I739 Peripheral vascular disease, unspecified: Secondary | ICD-10-CM | POA: Diagnosis not present

## 2015-11-23 DIAGNOSIS — I69351 Hemiplegia and hemiparesis following cerebral infarction affecting right dominant side: Secondary | ICD-10-CM

## 2015-11-23 DIAGNOSIS — R269 Unspecified abnormalities of gait and mobility: Secondary | ICD-10-CM

## 2015-11-23 NOTE — Telephone Encounter (Signed)
Dr Marcello Moores Butler/Dentist in [place] is calling to advise the pt is having a tooth removed and he needs clearance on blood thinner the pt is taking. The appt has not been scheduled yet but it will be within the next 2 weeks

## 2015-11-23 NOTE — Patient Instructions (Signed)
Urology referral

## 2015-11-23 NOTE — Progress Notes (Signed)
Subjective:    Patient ID: [name], male    DOB: Jun 15, 1927, 82 y.o.   MRN: [id number] CIR stay Transitional care f/u 82 year old right-handed male with history of CAD, maintained on aspirin, SVT, chronic renal insufficiency, creatinine 1.50, peripheral vascular disease.  Lives alone, independent, used a walker prior to admission.  The patient had stopped taking his aspirin on 08/13 in the anticipation of upcoming dental procedure.  MRI, CT, angiogram of the head and neck revealed acute left pontine infarct, generalized atrophy, moderate narrowing of midportion of basilar artery and multifocal moderate to severe narrowing of intracranial and internal carotid arteries, left greater than right. Carotid Doppler without significant stenosis.  Echocardiogram with ejection fraction of 123456, normal systolic function DATE OF ADMISSION:  10/21/2015 DATE OF DISCHARGE:  11/11/2015 HPI CC:  Penis is shorter  PT and RN started coming out, OT should be coming out this week Patient concerned about nausea Patient daughter also concerned. Also concerned about heart. Has cardiology appointment tomorrow, electrophysiologist to evaluate possible loop recorder. There was no recommendation for pacemaker placement when patient was in the hospital. Has seen primary care physician, Dr. Felipa Eth No urology referral was made, however, this was discussed. having problems with nausea, comes and goes Daughter is worried about meds causing nausea. Patient had some of this in the hospital and was given Zofran and this caused drowsiness.  Discussed role of taking multiple medications at once   Ambulating with walker, no falls   Pain Inventory Average Pain 0 Pain Right Now 0 My pain is no pain  In the last 24 hours, has pain interfered with the following? General activity 0 Relation with others 0 Enjoyment of life 0 What TIME of day is your pain at its worst? no pain Sleep (in general) NA  Pain is  worse with: no pain Pain improves with: no pain Relief from Meds: no pain  Mobility use a walker ability to climb steps?  yes do you drive?  no  Function retired  Neuro/Psych No problems in this area  Prior Studies Any changes since last visit?  no  Physicians involved in your care Any changes since last visit?  no   Family History  Problem Relation Age of Onset  . Cancer Mother     bladder  . CAD Father   . Heart attack Father    Social History   Social History  . Marital status: Widowed    Spouse name: N/A  . Number of children: N/A  . Years of education: N/A   Social History Main Topics  . Smoking status: Never Smoker  . Smokeless tobacco: Never Used  . Alcohol use No  . Drug use: No  . Sexual activity: Not Asked   Other Topics Concern  . None   Social History Narrative  . None   Past Surgical History:  Procedure Laterality Date  .  PTCA of coronary lesion Right 1995  . APPENDECTOMY    . PR VEIN BYPASS GRAFT,AORTO-FEM-POP Left    femoral-popliteal BP by Dr. Drucie Opitz  . repair of aal fissure   1978   Past Medical History:  Diagnosis Date  . Adenomatous polyp   . CAD (coronary artery disease)   . Chronic kidney disease   . Diverticulosis   . DVT (deep venous thrombosis) ([place])   . Gout   . History of pulmonary embolus (PE)    from a DVT in 2008  . Hyperglycemia   . Hyperlipidemia   .  Hypertension   . Mallory-Weiss tear   . Peripheral vascular disease ([place])   . SVT (supraventricular tachycardia) ([place])   . Vitamin B12 deficiency    BP 133/67   Pulse 94   Resp 16   SpO2 97%   Opioid Risk Score:   Fall Risk Score:  `1  Depression screen PHQ 2/9  No flowsheet data found.  Review of Systems  Constitutional: Positive for chills.  Gastrointestinal: Positive for constipation and nausea.  All other systems reviewed and are negative.      Objective:   Physical Exam  Constitutional: He is oriented to person, place, and time. He  appears well-developed and well-nourished.  HENT:  Head: Normocephalic and atraumatic.  Eyes: Conjunctivae and EOM are normal. Pupils are equal, round, and reactive to light.  Cardiovascular: Normal rate, regular rhythm and normal heart sounds.   No murmur heard. Pulmonary/Chest: Effort normal and breath sounds normal. No respiratory distress. He has no wheezes. He has no rales.  Abdominal: Soft. Bowel sounds are normal. He exhibits no distension. There is no tenderness.  Musculoskeletal: Normal range of motion. He exhibits no edema, tenderness or deformity.  Neurological: He is alert and oriented to person, place, and time. Coordination and gait abnormal.  Motor strength is 4 minus in the right deltoid, biceps, triceps, grip 4. At the right hip flexor, knee extensor, ankle dorsiflexor and plantar flexor 5/5 in the left deltoid, biceps, triceps, grip, hip flexor, knee extensor, ankle dorsiflexor and plantar flexor  Ambulates with a walker. No evidence of toe drag or knee stability. He is able to stand with his feet together eyes open without physical support Decreased fine motor in the right upper extremity  Psychiatric: His affect is labile.  Nursing note and vitals reviewed.     Assessment & Plan:  1. Right hemiparesis as late effect of left pontine infarct. He continues to require assistance with certain dressing tasks. Becoming more independent. Overall, but still has significant right-sided weakness and decreased coordination. Also has cognitive deficits related to stroke Continue home health PT, OT, speech therapy  2. History of bradycardia and a 12 second pause in acute care. Follows up with electrophysiology tomorrow  3. Nausea may be medication related. We discussed splitting up his morning medicines and taking half of them at a time one hour apart.  . 4. Emotional lability. Post stroke, had tried some Prozac in the hospital. However, this caused somnolence. We discussed that  this could get better with time but other antidepressants were also an option  5. Urologic complaints, feels like his penile length is shorter since pre-stroke. We discussed that this should not be a stroke related issue and that urology consult would be helpful to further evaluate. Patient does have some symptoms that are suggestive of spastic bladder, as well, and this can be addressed.  Return to clinic in 6 weeks. We'll need to assess for outpatient referral

## 2015-11-24 ENCOUNTER — Encounter (INDEPENDENT_AMBULATORY_CARE_PROVIDER_SITE_OTHER): Payer: Self-pay

## 2015-11-24 ENCOUNTER — Ambulatory Visit (INDEPENDENT_AMBULATORY_CARE_PROVIDER_SITE_OTHER): Payer: Medicare Other | Admitting: Internal Medicine

## 2015-11-24 ENCOUNTER — Encounter: Payer: Self-pay | Admitting: Neurology

## 2015-11-24 ENCOUNTER — Encounter: Payer: Self-pay | Admitting: Internal Medicine

## 2015-11-24 VITALS — BP 128/68 | HR 98 | Ht 68.0 in | Wt 171.2 lb

## 2015-11-24 DIAGNOSIS — I639 Cerebral infarction, unspecified: Secondary | ICD-10-CM | POA: Diagnosis not present

## 2015-11-24 MED ORDER — LISINOPRIL-HYDROCHLOROTHIAZIDE 20-25 MG PO TABS: 1.0000 | ORAL_TABLET | Freq: Every day | ORAL | 11 refills | Status: DC

## 2015-11-24 NOTE — Patient Instructions (Addendum)
Medication Instructions:  Your physician has recommended you make the following change in your medication:  1) STOP Hydralazine 2) STOP Lisinopril 5MG  2) START Prinzide 20/25 mg 1 tablet daily   Labwork: None Ordered   Testing/Procedures: LINQ recorder 12/01/15 at 6:30 AM  Follow-Up: Your physician recommends that you schedule a follow-up appointment in: 10-14 days after 12/01/15 in [facility] and 3 months with Dr. Lovena Le   Please arrive at the [facility] at 6:30AM on 12/01/15.  Nothing to eat or drink after midnight prior to procedure  May take medication with a sip of water day of procedure  You will need someone to drive you home.      If you need a refill on your cardiac medications before your next appointment, please call your pharmacy.

## 2015-11-24 NOTE — Telephone Encounter (Signed)
The patient had a pontine stroke about 6 weeks ago, on aspirin and Plavix currently. The has a mild residual hemiparesis, some gait disturbance. He is back home currently. The dental extractions Panel be done without taking him off of aspirin and Plavix, but this is the case, okay to proceed with the dental extraction. I will send a note to the dentist.  Fax # for Dr. Melina Copa is [phone].

## 2015-11-24 NOTE — Telephone Encounter (Signed)
Daughter Kirtland Bouchard [phone] called to advise, dentist Dr. Georgana Curio, [name] can remove 2 abscessed teeth without taking off of Plavix and aspirin but needs permission to do so.

## 2015-11-24 NOTE — Progress Notes (Signed)
HPI [name] is referred today to consider insertion of an ILR. He is a pleasant 82 yo man who looks younger than his age who has had 2 strokes of unclear etiology. He has also had a vagal spell in the hospital resulting in a long pause. The patient has undergone rehab and his strength and use of his right hand and arm has improved. He continues to have nausea. He has not had syncope. The etiology of his stroke is still unknown. Allergies  Allergen Reactions  . Lipitor [Atorvastatin]     Muscle pain   . Zetia [Ezetimibe] Hives     Current Outpatient Prescriptions  Medication Sig Dispense Refill  . amLODipine (NORVASC) 10 MG tablet Take 1 tablet (10 mg total) by mouth daily. 30 tablet 0  . aspirin 325 MG tablet Take 1 tablet (325 mg total) by mouth daily.    . clopidogrel (PLAVIX) 75 MG tablet Take 1 tablet (75 mg total) by mouth daily. 30 tablet 1  . diclofenac sodium (VOLTAREN) 1 % GEL Apply 2 g topically 4 (four) times daily. 1 Tube 1  . famotidine (PEPCID) 20 MG tablet Take 1 tablet (20 mg total) by mouth 2 (two) times daily. 60 tablet 0  . glimepiride (AMARYL) 2 MG tablet Take 1 tablet (2 mg total) by mouth daily with breakfast. 30 tablet 1  . polyethylene glycol (MIRALAX / GLYCOLAX) packet Take 17 g by mouth 2 (two) times daily as needed (constipation).    . pravastatin (PRAVACHOL) 20 MG tablet Take 1 tablet (20 mg total) by mouth daily at 6 PM. 30 tablet 1  . lisinopril-hydrochlorothiazide (PRINZIDE,ZESTORETIC) 20-25 MG tablet Take 1 tablet by mouth daily. 30 tablet 11   No current facility-administered medications for this visit.      Past Medical History:  Diagnosis Date  . Adenomatous polyp   . CAD (coronary artery disease)   . Chronic kidney disease   . Diverticulosis   . DVT (deep venous thrombosis) ([place])   . Gout   . History of pulmonary embolus (PE)    from a DVT in 2008  . Hyperglycemia   . Hyperlipidemia   . Hypertension   . Mallory-Weiss tear   .  Peripheral vascular disease ([place])   . SVT (supraventricular tachycardia) ([place])   . Vitamin B12 deficiency     ROS:   All systems reviewed and negative except as noted in the HPI.   Past Surgical History:  Procedure Laterality Date  .  PTCA of coronary lesion Right 1995  . APPENDECTOMY    . PR VEIN BYPASS GRAFT,AORTO-FEM-POP Left    femoral-popliteal BP by Dr. Drucie Opitz  . repair of aal fissure   1978     Family History  Problem Relation Age of Onset  . Cancer Mother     bladder  . CAD Father   . Heart attack Father      Social History   Social History  . Marital status: Widowed    Spouse name: N/A  . Number of children: N/A  . Years of education: N/A   Occupational History  . Not on file.   Social History Main Topics  . Smoking status: Never Smoker  . Smokeless tobacco: Never Used  . Alcohol use No  . Drug use: No  . Sexual activity: Not on file   Other Topics Concern  . Not on file   Social History Narrative  . No narrative on file  BP 128/68   Pulse 98   Ht 5\' 8"  (1.727 m)   Wt 171 lb 3.2 oz (77.7 kg)   BMI 26.03 kg/m   Physical Exam:  Well appearing elderly man, NAD HEENT: Unremarkable Neck:  6 cm JVD, no thyromegally Lymphatics:  No adenopathy Back:  No CVA tenderness Lungs:  Clear with no wheezes HEART:  Regular rate rhythm, no murmurs, no rubs, no clicks Abd:  soft, positive bowel sounds, no organomegally, no rebound, no guarding Ext:  2 plus pulses, no edema, no cyanosis, no clubbing Skin:  No rashes no nodules Neuro:  CN II through XII intact, motor grossly intact  EKG - reviewed - NSR  Assess/Plan: 1. Cryptogenic stroke - the etiology is unclear. His advanced age puts him at risk for atrial fib. I would suggest he undergo insertion of an ILR.  2. CAD - no anginal symptoms. 3. Syncope - he had a vagal spell in the hospital and a long pause. Will follow with ILR.  4. SVT - no recent episodes. It is possible that he is  transitioning to atrial fib but this has never been documented.  [name].D.

## 2015-11-25 ENCOUNTER — Telehealth: Payer: Self-pay

## 2015-11-25 NOTE — Telephone Encounter (Signed)
PA was done on [email or web address] and was denied

## 2015-11-27 ENCOUNTER — Other Ambulatory Visit: Payer: Self-pay | Admitting: *Deleted

## 2015-11-27 ENCOUNTER — Telehealth: Payer: Self-pay | Admitting: Physical Medicine & Rehabilitation

## 2015-11-27 MED ORDER — DICLOFENAC SODIUM 1 % TD GEL: 2.0000 g | Freq: Four times a day (QID) | TRANSDERMAL | 1 refills | Status: DC

## 2015-11-27 NOTE — Telephone Encounter (Signed)
Mary with [employer] needs a call back about prior authorization on Voltaren Gel.  She needs to know where the patients osteoarthritis pain is and if the have tried anything for this pain.  Her number is [phone].

## 2015-11-30 ENCOUNTER — Telehealth: Payer: Self-pay | Admitting: Physical Medicine & Rehabilitation

## 2015-11-30 ENCOUNTER — Telehealth: Payer: Self-pay | Admitting: Internal Medicine

## 2015-11-30 NOTE — Telephone Encounter (Signed)
[employer] called 11/27/15 @ 4:43 PM to state that the urgent appeal that was sent through for the patient has been DENIED and they will be mailing a letter about this - a secondary appeal can be made as well.  did not leave a call back number at this time.

## 2015-11-30 NOTE — Telephone Encounter (Signed)
New message     Pts dtr calling about her father. She states that the a change in med of Lisinopril HCT has had the pt really tired, no energy and he gets up 2-3 times each night to go to the bathroom which he wasn't doing before. She wants to know if the mg are to high. Please call.   Pt c/o medication issue:  1. Name of Medication:Lisinopril HCT 2. How are you currently taking this medication (dosage and times per day)? 25 mg, once a day  3. Are you having a reaction (difficulty breathing--STAT)? no  4. What is your medication issue? Pt has no energy, just drained

## 2015-11-30 NOTE — Telephone Encounter (Signed)
Called pt's daughter. Informed pt could be in a different cardiac rhythm, which could be causing fatigue.   I then spoke with pt's Physical Therapist, Gerald Stabs. Gerald Stabs gave me vitals - BP (seated) 133/66 (standing) 110/60, HR 100, O2 98%, Temp 97.7.  Said pt was moving more slowly than last week.   Encouraged pt to keep appt tomorrow for Loop. Informed to call back with any questions, issues, or concerns. Pt's daughter was pleasant and verbalized understanding of information.   Will forward information to Dr. Lovena Le.

## 2015-11-30 NOTE — Telephone Encounter (Signed)
Called pt's daughter. Pt's nausea stopped after Hydralazine was stopped 11-25-15. Pt's daughter said pt started feeling weak/tired when he started Lisinopril HCT last Wednesday (11/25/15). Pt's weakness has progressively gotten worse every day since 11/25/15. Pt had a cramp in his toe at 1:00 AM this morning. Pt denies SOB or pain. No swelling noted. Pt said he is getting up a couple of times at night to urinate since starting Lisinopril HCT. Pt is taking med in the morning.   BP last night was 138/61, HR 74. BP today 153/76, HR 88.   Pt is scheduled for Loop tomorrow. Will forward to Dr. Lovena Le and DOD to advise.

## 2015-11-30 NOTE — Telephone Encounter (Signed)
No need to follow up, Diclofenac sodium (generic for Voltaren gel) was ordered.

## 2015-12-01 ENCOUNTER — Encounter (HOSPITAL_COMMUNITY)
Admission: RE | Disposition: A | Discharge status: Home / Self Care | Payer: Self-pay | Source: Ambulatory Visit | Attending: Internal Medicine

## 2015-12-01 ENCOUNTER — Ambulatory Visit (HOSPITAL_COMMUNITY)
Admission: RE | Admit: 2015-12-01 | Discharge: 2015-12-01 | Disposition: A | Discharge status: Home / Self Care | Payer: Medicare Other | Source: Ambulatory Visit | Attending: Internal Medicine | Admitting: Internal Medicine

## 2015-12-01 ENCOUNTER — Encounter (HOSPITAL_COMMUNITY): Payer: Self-pay | Admitting: Internal Medicine

## 2015-12-01 ENCOUNTER — Telehealth: Payer: Self-pay

## 2015-12-01 DIAGNOSIS — M109 Gout, unspecified: Secondary | ICD-10-CM | POA: Insufficient documentation

## 2015-12-01 DIAGNOSIS — Z7982 Long term (current) use of aspirin: Secondary | ICD-10-CM | POA: Diagnosis not present

## 2015-12-01 DIAGNOSIS — Z86010 Personal history of colonic polyps: Secondary | ICD-10-CM | POA: Insufficient documentation

## 2015-12-01 DIAGNOSIS — I638 Other cerebral infarction: Secondary | ICD-10-CM | POA: Diagnosis not present

## 2015-12-01 DIAGNOSIS — I6302 Cerebral infarction due to thrombosis of basilar artery: Secondary | ICD-10-CM | POA: Diagnosis present

## 2015-12-01 DIAGNOSIS — K5790 Diverticulosis of intestine, part unspecified, without perforation or abscess without bleeding: Secondary | ICD-10-CM | POA: Insufficient documentation

## 2015-12-01 DIAGNOSIS — R739 Hyperglycemia, unspecified: Secondary | ICD-10-CM | POA: Diagnosis not present

## 2015-12-01 DIAGNOSIS — I739 Peripheral vascular disease, unspecified: Secondary | ICD-10-CM | POA: Insufficient documentation

## 2015-12-01 DIAGNOSIS — Z86711 Personal history of pulmonary embolism: Secondary | ICD-10-CM | POA: Diagnosis not present

## 2015-12-01 DIAGNOSIS — I2510 Atherosclerotic heart disease of native coronary artery without angina pectoris: Secondary | ICD-10-CM | POA: Insufficient documentation

## 2015-12-01 DIAGNOSIS — Z8052 Family history of malignant neoplasm of bladder: Secondary | ICD-10-CM | POA: Insufficient documentation

## 2015-12-01 DIAGNOSIS — I471 Supraventricular tachycardia: Secondary | ICD-10-CM | POA: Insufficient documentation

## 2015-12-01 DIAGNOSIS — I129 Hypertensive chronic kidney disease with stage 1 through stage 4 chronic kidney disease, or unspecified chronic kidney disease: Secondary | ICD-10-CM | POA: Diagnosis not present

## 2015-12-01 DIAGNOSIS — Z86718 Personal history of other venous thrombosis and embolism: Secondary | ICD-10-CM | POA: Diagnosis not present

## 2015-12-01 DIAGNOSIS — Z8249 Family history of ischemic heart disease and other diseases of the circulatory system: Secondary | ICD-10-CM | POA: Insufficient documentation

## 2015-12-01 DIAGNOSIS — N189 Chronic kidney disease, unspecified: Secondary | ICD-10-CM | POA: Insufficient documentation

## 2015-12-01 DIAGNOSIS — Z7902 Long term (current) use of antithrombotics/antiplatelets: Secondary | ICD-10-CM | POA: Diagnosis not present

## 2015-12-01 DIAGNOSIS — E538 Deficiency of other specified B group vitamins: Secondary | ICD-10-CM | POA: Insufficient documentation

## 2015-12-01 DIAGNOSIS — I639 Cerebral infarction, unspecified: Secondary | ICD-10-CM | POA: Diagnosis not present

## 2015-12-01 DIAGNOSIS — E785 Hyperlipidemia, unspecified: Secondary | ICD-10-CM | POA: Diagnosis not present

## 2015-12-01 HISTORY — PX: EP IMPLANTABLE DEVICE: SHX172B

## 2015-12-01 LAB — GLUCOSE, CAPILLARY: GLUCOSE-CAPILLARY: 100 mg/dL — AB (ref 65–99)

## 2015-12-01 SURGERY — LOOP RECORDER INSERTION

## 2015-12-01 MED ORDER — LIDOCAINE-EPINEPHRINE 1 %-1:100000 IJ SOLN
INTRAMUSCULAR | Status: AC
  Filled 2015-12-01: qty 1

## 2015-12-01 MED ORDER — LIDOCAINE-EPINEPHRINE 1 %-1:100000 IJ SOLN
INTRAMUSCULAR | Status: DC | PRN
  Administered 2015-12-01: 20 mL

## 2015-12-01 SURGICAL SUPPLY — 2 items
LOOP REVEAL LINQSYS (Prosthesis & Implant Heart) ×2 IMPLANT
PACK LOOP INSERTION (CUSTOM PROCEDURE TRAY) ×2 IMPLANT

## 2015-12-01 NOTE — Interval H&P Note (Signed)
History and Physical Interval Note:  12/01/2015 7:56 AM  [name]  has presented today for surgery, with the diagnosis of stroke  The various methods of treatment have been discussed with the patient and family. After consideration of risks, benefits and other options for treatment, the patient has consented to  Procedure(s): Loop Recorder Insertion (N/A) as a surgical intervention .  The patient's history has been reviewed, patient examined, no change in status, stable for surgery.  I have reviewed the patient's chart and labs.  Questions were answered to the patient's satisfaction.     [name]

## 2015-12-01 NOTE — H&P (View-Only) (Signed)
HPI [name] is referred today to consider insertion of an ILR. He is a pleasant 82 yo man who looks younger than his age who has had 2 strokes of unclear etiology. He has also had a vagal spell in the hospital resulting in a long pause. The patient has undergone rehab and his strength and use of his right hand and arm has improved. He continues to have nausea. He has not had syncope. The etiology of his stroke is still unknown. Allergies  Allergen Reactions  . Lipitor [Atorvastatin]     Muscle pain   . Zetia [Ezetimibe] Hives     Current Outpatient Prescriptions  Medication Sig Dispense Refill  . amLODipine (NORVASC) 10 MG tablet Take 1 tablet (10 mg total) by mouth daily. 30 tablet 0  . aspirin 325 MG tablet Take 1 tablet (325 mg total) by mouth daily.    . clopidogrel (PLAVIX) 75 MG tablet Take 1 tablet (75 mg total) by mouth daily. 30 tablet 1  . diclofenac sodium (VOLTAREN) 1 % GEL Apply 2 g topically 4 (four) times daily. 1 Tube 1  . famotidine (PEPCID) 20 MG tablet Take 1 tablet (20 mg total) by mouth 2 (two) times daily. 60 tablet 0  . glimepiride (AMARYL) 2 MG tablet Take 1 tablet (2 mg total) by mouth daily with breakfast. 30 tablet 1  . polyethylene glycol (MIRALAX / GLYCOLAX) packet Take 17 g by mouth 2 (two) times daily as needed (constipation).    . pravastatin (PRAVACHOL) 20 MG tablet Take 1 tablet (20 mg total) by mouth daily at 6 PM. 30 tablet 1  . lisinopril-hydrochlorothiazide (PRINZIDE,ZESTORETIC) 20-25 MG tablet Take 1 tablet by mouth daily. 30 tablet 11   No current facility-administered medications for this visit.      Past Medical History:  Diagnosis Date  . Adenomatous polyp   . CAD (coronary artery disease)   . Chronic kidney disease   . Diverticulosis   . DVT (deep venous thrombosis) ([place])   . Gout   . History of pulmonary embolus (PE)    from a DVT in 2008  . Hyperglycemia   . Hyperlipidemia   . Hypertension   . Mallory-Weiss tear   .  Peripheral vascular disease ([place])   . SVT (supraventricular tachycardia) ([place])   . Vitamin B12 deficiency     ROS:   All systems reviewed and negative except as noted in the HPI.   Past Surgical History:  Procedure Laterality Date  .  PTCA of coronary lesion Right 1995  . APPENDECTOMY    . PR VEIN BYPASS GRAFT,AORTO-FEM-POP Left    femoral-popliteal BP by Dr. Drucie Opitz  . repair of aal fissure   1978     Family History  Problem Relation Age of Onset  . Cancer Mother     bladder  . CAD Father   . Heart attack Father      Social History   Social History  . Marital status: Widowed    Spouse name: N/A  . Number of children: N/A  . Years of education: N/A   Occupational History  . Not on file.   Social History Main Topics  . Smoking status: Never Smoker  . Smokeless tobacco: Never Used  . Alcohol use No  . Drug use: No  . Sexual activity: Not on file   Other Topics Concern  . Not on file   Social History Narrative  . No narrative on file  BP 128/68   Pulse 98   Ht 5\' 8"  (1.727 m)   Wt 171 lb 3.2 oz (77.7 kg)   BMI 26.03 kg/m   Physical Exam:  Well appearing elderly man, NAD HEENT: Unremarkable Neck:  6 cm JVD, no thyromegally Lymphatics:  No adenopathy Back:  No CVA tenderness Lungs:  Clear with no wheezes HEART:  Regular rate rhythm, no murmurs, no rubs, no clicks Abd:  soft, positive bowel sounds, no organomegally, no rebound, no guarding Ext:  2 plus pulses, no edema, no cyanosis, no clubbing Skin:  No rashes no nodules Neuro:  CN II through XII intact, motor grossly intact  EKG - reviewed - NSR  Assess/Plan: 1. Cryptogenic stroke - the etiology is unclear. His advanced age puts him at risk for atrial fib. I would suggest he undergo insertion of an ILR.  2. CAD - no anginal symptoms. 3. Syncope - he had a vagal spell in the hospital and a long pause. Will follow with ILR.  4. SVT - no recent episodes. It is possible that he is  transitioning to atrial fib but this has never been documented.  [name].D.

## 2015-12-01 NOTE — Telephone Encounter (Signed)
PA for Diclofenac Sodium 1/% was denied due to certain guidelines. Made several attempts to get approval but cannot get approval

## 2015-12-01 NOTE — Telephone Encounter (Signed)
F.u Message  Pt daughter call requesting to speak with RN to f/u on medication Lisinopril. please call back to discuss

## 2015-12-01 NOTE — Telephone Encounter (Signed)
Called pt's daughter back. Pt had Loop placed today. Pt's daughter stated Dr.Taylor advised to decrease Lisinopril-HCTZ to 20-12.5 mg. Reviewed notes. I did not see the order. Advised pt to hold Lisinopril-HCTZ 20-25 mg today. I will call tomorrow after speaking with Dr. Lovena Le tomorrow morning.   Pt's daughter verbalized understanding and agreed with plan.

## 2015-12-01 NOTE — Telephone Encounter (Signed)
Follow up    Pt daughter verbalized that she is following up on medication issue from 11/30/15

## 2015-12-02 MED ORDER — LISINOPRIL-HYDROCHLOROTHIAZIDE 20-12.5 MG PO TABS: 1.0000 | ORAL_TABLET | Freq: Every day | ORAL | 11 refills | Status: DC

## 2015-12-02 NOTE — Telephone Encounter (Signed)
Called pt. Informed pt to stop Lisinopril-HCTZ 20-25 mg and start Lisinopril-HCTZ 20-12.5 mg. Informed Rx called in to his pharmacy and he should start new med this morning. Pt verbalized understanding and agreed with plan.

## 2015-12-02 NOTE — Telephone Encounter (Signed)
Called pts daughter back, she stated that there was blood on the gauze. Informed pts daughter that was normal. She stated that outside of the tegaderm pts skin was red and seemed to be spreading. She stated that chills are normal for pt, but denied that pt was running a fever. Informed her that its possible pt could be having a reaction to the tegaderm. Informed her that she could take the tegaderm off and see if that helped with the redness and to apply more gauze and pressure if bleeding occurs. Instructed her not to remove steri-strips. Pts daughter voiced understanding. Pts daughter stated that she was going to attempt to upload a picture to pts MyChart, and she would call if she could get it uploaded.

## 2015-12-02 NOTE — Addendum Note (Signed)
Addended by: Briant Cedar R on: 12/02/2015 08:14 AM   Modules accepted: Orders

## 2015-12-02 NOTE — Telephone Encounter (Signed)
Follow up    1. Has your device fired? no  2. Is you device beeping? no  3. Are you experiencing draining or swelling at device site? no  4. Are you calling to see if we received your device transmission? no  5. Have you passed out? no  Pt daughter verbalized that the site is red and she said that she don't know when should take the Lorelle Formosa off or if she needs to leave it on there.

## 2015-12-08 ENCOUNTER — Telehealth: Payer: Self-pay | Admitting: Physical Medicine & Rehabilitation

## 2015-12-08 NOTE — Telephone Encounter (Signed)
noted 

## 2015-12-08 NOTE — Telephone Encounter (Signed)
[place] with [facility] needed to let us know about a missed visit last wekk with patient, due to personal reasons.  Will resume care this week with patient.

## 2015-12-14 ENCOUNTER — Ambulatory Visit (INDEPENDENT_AMBULATORY_CARE_PROVIDER_SITE_OTHER): Payer: Medicare Other | Admitting: *Deleted

## 2015-12-14 DIAGNOSIS — I639 Cerebral infarction, unspecified: Secondary | ICD-10-CM

## 2015-12-18 LAB — CUP PACEART INCLINIC DEVICE CHECK: Date Time Interrogation Session: 20171020140737

## 2015-12-18 NOTE — Progress Notes (Signed)
Wound check appointment. Wound without redness or edema. Incision edges approximated, wound well healed. Loop check in clinic. Battery status: good. R-waves 0.41mV. 0 symptom episodes, 0 tachy episodes, 0 pause episodes, 0 brady episodes. 0 AF episodes. Monthly summary reports and ROV with GT PRN

## 2015-12-31 ENCOUNTER — Ambulatory Visit (INDEPENDENT_AMBULATORY_CARE_PROVIDER_SITE_OTHER): Payer: Medicare Other | Admitting: *Deleted

## 2015-12-31 DIAGNOSIS — I639 Cerebral infarction, unspecified: Secondary | ICD-10-CM

## 2016-01-01 NOTE — Progress Notes (Signed)
Carelink Summary Report / Loop Recorder 

## 2016-01-04 ENCOUNTER — Encounter: Payer: Medicare Other | Attending: Physical Medicine & Rehabilitation

## 2016-01-04 ENCOUNTER — Ambulatory Visit (HOSPITAL_BASED_OUTPATIENT_CLINIC_OR_DEPARTMENT_OTHER): Payer: Medicare Other | Admitting: Physical Medicine & Rehabilitation

## 2016-01-04 ENCOUNTER — Encounter: Payer: Self-pay | Admitting: Physical Medicine & Rehabilitation

## 2016-01-04 VITALS — BP 155/79 | HR 89 | Resp 14

## 2016-01-04 DIAGNOSIS — R4586 Emotional lability: Secondary | ICD-10-CM | POA: Diagnosis not present

## 2016-01-04 DIAGNOSIS — R269 Unspecified abnormalities of gait and mobility: Secondary | ICD-10-CM | POA: Diagnosis not present

## 2016-01-04 DIAGNOSIS — G8191 Hemiplegia, unspecified affecting right dominant side: Secondary | ICD-10-CM | POA: Diagnosis not present

## 2016-01-04 DIAGNOSIS — R110 Nausea: Secondary | ICD-10-CM | POA: Diagnosis not present

## 2016-01-04 DIAGNOSIS — I6350 Cerebral infarction due to unspecified occlusion or stenosis of unspecified cerebral artery: Secondary | ICD-10-CM | POA: Insufficient documentation

## 2016-01-04 DIAGNOSIS — I69351 Hemiplegia and hemiparesis following cerebral infarction affecting right dominant side: Secondary | ICD-10-CM

## 2016-01-04 DIAGNOSIS — I739 Peripheral vascular disease, unspecified: Secondary | ICD-10-CM | POA: Insufficient documentation

## 2016-01-04 DIAGNOSIS — R001 Bradycardia, unspecified: Secondary | ICD-10-CM | POA: Diagnosis not present

## 2016-01-04 DIAGNOSIS — Z7982 Long term (current) use of aspirin: Secondary | ICD-10-CM | POA: Insufficient documentation

## 2016-01-04 DIAGNOSIS — I69398 Other sequelae of cerebral infarction: Secondary | ICD-10-CM | POA: Diagnosis not present

## 2016-01-04 DIAGNOSIS — I2510 Atherosclerotic heart disease of native coronary artery without angina pectoris: Secondary | ICD-10-CM | POA: Diagnosis not present

## 2016-01-04 NOTE — Progress Notes (Signed)
Subjective:    Patient ID: [name], male    DOB: [birth year], 82 y.o.   MRN: [id number]  HPI  Major c/o is shortening of penis Improved to Mod I amb with RW Able to do ADLs mod I OT stopped coming out after pt improved with ADLs  Pain Inventory Average Pain no pain Pain Right Now no pain My pain is no pain  In the last 24 hours, has pain interfered with the following? General activity no pain Relation with others no pain Enjoyment of life no pain What TIME of day is your pain at its worst? no pain Sleep (in general) Poor  Pain is worse with: no pain Pain improves with: no pain Relief from Meds: no pain  Mobility use a walker ability to climb steps?  no do you drive?  no Do you have any goals in this area?  yes  Function I need assistance with the following:  bathing, meal prep, household duties and shopping  Neuro/Psych weakness  Prior Studies Any changes since last visit?  no  Physicians involved in your care Any changes since last visit?  no   Family History  Problem Relation Age of Onset  . Cancer Mother     bladder  . CAD Father   . Heart attack Father    Social History   Social History  . Marital status: Widowed    Spouse name: N/A  . Number of children: N/A  . Years of education: N/A   Social History Main Topics  . Smoking status: Never Smoker  . Smokeless tobacco: Never Used  . Alcohol use No  . Drug use: No  . Sexual activity: Not Asked   Other Topics Concern  . None   Social History Narrative  . None   Past Surgical History:  Procedure Laterality Date  .  PTCA of coronary lesion Right 1995  . APPENDECTOMY    . EP IMPLANTABLE DEVICE N/A 12/01/2015   Procedure: Loop Recorder Insertion;  Surgeon: Evans Lance, MD;  Location: [facility];  Service: Cardiovascular;  Laterality: N/A;  . PR VEIN BYPASS GRAFT,AORTO-FEM-POP Left    femoral-popliteal BP by Dr. Drucie Opitz  . repair of aal fissure   1978   Past Medical  History:  Diagnosis Date  . Adenomatous polyp   . CAD (coronary artery disease)   . Chronic kidney disease   . Diverticulosis   . DVT (deep venous thrombosis) ([place])   . Gout   . History of pulmonary embolus (PE)    from a DVT in 2008  . Hyperglycemia   . Hyperlipidemia   . Hypertension   . Mallory-Weiss tear   . Peripheral vascular disease ([place])   . SVT (supraventricular tachycardia) ([place])   . Vitamin B12 deficiency    BP (!) 155/79   Pulse 89   Resp 14   SpO2 98%   Opioid Risk Score:   Fall Risk Score:  `1  Depression screen PHQ 2/9  No flowsheet data found.  Review of Systems  All other systems reviewed and are negative.      Objective:   Physical Exam  Constitutional: He is oriented to person, place, and time. He appears well-developed and well-nourished.  HENT:  Head: Normocephalic and atraumatic.  Eyes: Conjunctivae and EOM are normal. Pupils are equal, round, and reactive to light.  Neurological: He is alert and oriented to person, place, and time.  Psychiatric: He has a normal mood and affect.  Nursing note and vitals reviewed.   Right deltoid, bicep, tricep, grip 4/5 left deltoid, biceps, triceps, grip 5/5 4/5 right hip flexor, knee extensor, ankle dorsiflexor. 5/5 left hip flexor, knee extensor, ankle dorsal flexor      Assessment & Plan:  1. Left pontine infarct with right hemiparesis, discussed with patient. Should plateau between 3-6 months post his currently around 3 months post-CVA. Continue home health PT We will order home health OT asked them to work more with right upper limb, neuromuscular reeducation, given that he is now modified independent with ADLs. PM and R follow-up when necessary Follow-up urology for his chief complaint Follow-up neurology Follow-up primary care Follow-up cardiology

## 2016-02-01 ENCOUNTER — Ambulatory Visit (INDEPENDENT_AMBULATORY_CARE_PROVIDER_SITE_OTHER): Payer: Medicare Other | Admitting: *Deleted

## 2016-02-01 DIAGNOSIS — I639 Cerebral infarction, unspecified: Secondary | ICD-10-CM | POA: Diagnosis not present

## 2016-02-01 NOTE — Progress Notes (Signed)
Carelink Summary Report / Loop Recorder 

## 2016-02-06 LAB — CUP PACEART REMOTE DEVICE CHECK
Implantable Pulse Generator Implant Date: 20171003
MDC IDC SESS DTM: 20171102113857

## 2016-02-06 NOTE — Progress Notes (Signed)
Carelink summary report received. Battery status OK. Normal device function. No new symptom episodes, tachy episodes, brady, or pause episodes. No new AF episodes. Monthly summary reports and ROV/PRN 

## 2016-02-25 ENCOUNTER — Encounter: Payer: Self-pay | Admitting: Neurology

## 2016-02-25 ENCOUNTER — Ambulatory Visit (INDEPENDENT_AMBULATORY_CARE_PROVIDER_SITE_OTHER): Payer: Medicare Other | Admitting: Neurology

## 2016-02-25 ENCOUNTER — Ambulatory Visit: Payer: Medicare Other | Admitting: Nurse Practitioner

## 2016-02-25 VITALS — BP 152/77 | HR 81 | Ht 68.0 in | Wt 175.0 lb

## 2016-02-25 DIAGNOSIS — I639 Cerebral infarction, unspecified: Secondary | ICD-10-CM

## 2016-02-25 DIAGNOSIS — I6350 Cerebral infarction due to unspecified occlusion or stenosis of unspecified cerebral artery: Secondary | ICD-10-CM | POA: Diagnosis not present

## 2016-02-25 DIAGNOSIS — G8191 Hemiplegia, unspecified affecting right dominant side: Secondary | ICD-10-CM

## 2016-02-25 NOTE — Progress Notes (Signed)
Reason for visit: Stroke  [name] is an 82 y.o. male  History of present illness:  [name] is an 82 year old right-handed white male with a history of a left temporal and left pontine stroke that occurred in August 2017. The patient sustained a right hemiparesis and a gait disorder. He has undergone 3 months of home health physical therapy, he is currently able to walk up and down stairs with minimal assistance. He uses a cane for ambulation. He has had one fall that occurred when he was stooping over to pick up something. The patient reports some occasional crawling sensations on the left side of the head, but no discomfort. He has had some problems with urinary frequency at night since the stroke. Family reports some mild pseudobulbar affect, no medications were initiated. The patient has seen a urologist for the bladder, no medications were prescribed. He returns for an evaluation. He remains on aspirin and Plavix. A loop recorder has been placed, no reports of atrial fibrillation have occurred.  Past Medical History:  Diagnosis Date  . Adenomatous polyp   . CAD (coronary artery disease)   . Chronic kidney disease   . Diverticulosis   . DVT (deep venous thrombosis) ([place])   . Gout   . History of pulmonary embolus (PE)    from a DVT in 2008  . Hyperglycemia   . Hyperlipidemia   . Hypertension   . Mallory-Weiss tear   . Peripheral vascular disease ([place])   . SVT (supraventricular tachycardia) ([place])   . Vitamin B12 deficiency     Past Surgical History:  Procedure Laterality Date  .  PTCA of coronary lesion Right 1995  . APPENDECTOMY    . EP IMPLANTABLE DEVICE N/A 12/01/2015   Procedure: Loop Recorder Insertion;  Surgeon: Evans Lance, MD;  Location: [facility];  Service: Cardiovascular;  Laterality: N/A;  . PR VEIN BYPASS GRAFT,AORTO-FEM-POP Left    femoral-popliteal BP by Dr. Drucie Opitz  . repair of aal fissure   1978    Family History  Problem Relation Age  of Onset  . Cancer Mother     bladder  . CAD Father   . Heart attack Father     Social history:  reports that he has never smoked. He has never used smokeless tobacco. He reports that he does not drink alcohol or use drugs.    Allergies  Allergen Reactions  . Hydralazine Nausea Only  . Lipitor [Atorvastatin]     Muscle pain   . Zetia [Ezetimibe] Hives    Medications:  Prior to Admission medications   Medication Sig Start Date End Date Taking? Authorizing Provider  acetaminophen (TYLENOL) 500 MG tablet Take 500-1,000 mg by mouth every 6 (six) hours as needed (pain).   Yes Historical Provider, MD  amLODipine (NORVASC) 10 MG tablet Take 1 tablet (10 mg total) by mouth daily. 11/12/15  Yes Daniel J Angiulli, PA-C  aspirin 325 MG tablet Take 1 tablet (325 mg total) by mouth daily. 10/21/15  Yes Patrecia Pour, MD  clopidogrel (PLAVIX) 75 MG tablet Take 1 tablet (75 mg total) by mouth daily. 11/11/15  Yes Daniel J Angiulli, PA-C  diclofenac sodium (VOLTAREN) 1 % GEL Apply 2 g topically 4 (four) times daily. 11/27/15  Yes Charlett Blake, MD  famotidine (PEPCID) 20 MG tablet Take 1 tablet (20 mg total) by mouth 2 (two) times daily. Patient taking differently: Take 20 mg by mouth 2 (two)  times daily as needed.  11/11/15  Yes Daniel J Angiulli, PA-C  glimepiride (AMARYL) 2 MG tablet Take 1 tablet (2 mg total) by mouth daily with breakfast. 11/12/15  Yes Daniel J Angiulli, PA-C  lisinopril (PRINIVIL,ZESTRIL) 20 MG tablet Take 20 mg by mouth daily.   Yes Historical Provider, MD  polyethylene glycol (MIRALAX / GLYCOLAX) packet Take 17 g by mouth 2 (two) times daily as needed (constipation).   Yes Historical Provider, MD  rosuvastatin (CRESTOR) 5 MG tablet  02/04/16  Yes Historical Provider, MD    ROS:  Out of a complete 14 system review of symptoms, the patient complains only of the following symptoms, and all other reviewed systems are negative.  Running nose  Muscle cramps  Speech difficulty   Walking difficulty    Blood pressure (!) 152/77, pulse 81, height 5\' 8"  (1.727 m), weight 175 lb (79.4 kg).  Physical Exam  General: The patient is alert and cooperative at the time of the examination.  Skin: No significant peripheral edema is noted.   Neurologic Exam  Mental status: The patient is alert and oriented x 3 at the time of the examination. The patient has apparent normal recent and remote memory, with an apparently normal attention span and concentration ability.   Cranial nerves: Facial symmetry is  not present.Mild depression of the right nasolabial fold was noted.  Speech is normal, no aphasia or dysarthria is noted. Extraocular movements are full. Visual fields are full.  Motor: The patient has good strength in all 4 extremities.  Sensory examination: Soft touch sensation is symmetric on the face, arms, and legs.  Coordination: The patient has good finger-nose-finger and heel-to-shin bilaterally.  Gait and station: The patient has a slightly wide-based gait, slow, patient uses a cane for ambulation. Tandem gait was not attempted.  Romberg is negative. No drift is seen.  Reflexes: Deep tendon reflexes are elevated on the right arm and right leg relative to the left.   Assessment/Plan:  1. History of stroke, right hemiparesis   The patient has undergone 3 months of physical therapy, he likely has plateaued at this point, if there is a decline off of physical therapy the family will contact me and I will reinstitute the therapy. The patient will follow-up in about 5 months, if he is doing well at that time, no further follow-up is needed.    Jill Alexanders MD 02/25/2016 3:03 PM  [facility] [address] [place], [place] [address]  Phone [phone] Fax [phone]

## 2016-03-01 ENCOUNTER — Ambulatory Visit (INDEPENDENT_AMBULATORY_CARE_PROVIDER_SITE_OTHER): Payer: Medicare Other | Admitting: *Deleted

## 2016-03-01 DIAGNOSIS — I639 Cerebral infarction, unspecified: Secondary | ICD-10-CM

## 2016-03-02 NOTE — Progress Notes (Signed)
Carelink Summary Report 

## 2016-03-10 LAB — CUP PACEART REMOTE DEVICE CHECK
Implantable Pulse Generator Implant Date: 20171003
MDC IDC SESS DTM: 20171202124119

## 2016-03-30 ENCOUNTER — Ambulatory Visit (INDEPENDENT_AMBULATORY_CARE_PROVIDER_SITE_OTHER): Payer: Medicare Other | Admitting: *Deleted

## 2016-03-30 DIAGNOSIS — I639 Cerebral infarction, unspecified: Secondary | ICD-10-CM | POA: Diagnosis not present

## 2016-03-30 NOTE — Progress Notes (Signed)
Carelink Summary Report / Loop Recorder 

## 2016-04-05 ENCOUNTER — Other Ambulatory Visit: Payer: Self-pay | Admitting: Internal Medicine

## 2016-04-11 LAB — CUP PACEART REMOTE DEVICE CHECK
Date Time Interrogation Session: 20180101130636
MDC IDC PG IMPLANT DT: 20171003

## 2016-04-28 LAB — CUP PACEART REMOTE DEVICE CHECK
Implantable Pulse Generator Implant Date: 20171003
MDC IDC SESS DTM: 20180131131159

## 2016-04-29 ENCOUNTER — Ambulatory Visit (INDEPENDENT_AMBULATORY_CARE_PROVIDER_SITE_OTHER): Payer: Medicare Other | Admitting: *Deleted

## 2016-04-29 DIAGNOSIS — I639 Cerebral infarction, unspecified: Secondary | ICD-10-CM | POA: Diagnosis not present

## 2016-05-02 NOTE — Progress Notes (Signed)
Carelink Summary Report / Loop Recorder 

## 2016-05-18 LAB — CUP PACEART REMOTE DEVICE CHECK
Date Time Interrogation Session: 20180302130849
MDC IDC PG IMPLANT DT: 20171003

## 2016-05-30 ENCOUNTER — Ambulatory Visit (INDEPENDENT_AMBULATORY_CARE_PROVIDER_SITE_OTHER): Payer: Medicare Other | Admitting: *Deleted

## 2016-05-30 DIAGNOSIS — I639 Cerebral infarction, unspecified: Secondary | ICD-10-CM | POA: Diagnosis not present

## 2016-05-30 NOTE — Progress Notes (Signed)
Carelink Summary Report / Loop Recorder 

## 2016-06-02 LAB — CUP PACEART REMOTE DEVICE CHECK
Implantable Pulse Generator Implant Date: 20171003
MDC IDC SESS DTM: 20180401133559

## 2016-06-04 ENCOUNTER — Encounter (HOSPITAL_COMMUNITY): Payer: Self-pay | Admitting: *Deleted

## 2016-06-04 ENCOUNTER — Emergency Department (HOSPITAL_COMMUNITY): Payer: Medicare Other

## 2016-06-04 ENCOUNTER — Emergency Department (HOSPITAL_COMMUNITY)
Admission: EM | Admit: 2016-06-04 | Discharge: 2016-06-05 | Disposition: A | Discharge status: Home / Self Care | Payer: Medicare Other | Attending: Physician Assistant | Admitting: Physician Assistant

## 2016-06-04 DIAGNOSIS — Z7982 Long term (current) use of aspirin: Secondary | ICD-10-CM | POA: Diagnosis not present

## 2016-06-04 DIAGNOSIS — R509 Fever, unspecified: Secondary | ICD-10-CM | POA: Diagnosis not present

## 2016-06-04 DIAGNOSIS — E1122 Type 2 diabetes mellitus with diabetic chronic kidney disease: Secondary | ICD-10-CM | POA: Insufficient documentation

## 2016-06-04 DIAGNOSIS — Z79899 Other long term (current) drug therapy: Secondary | ICD-10-CM | POA: Diagnosis not present

## 2016-06-04 DIAGNOSIS — I129 Hypertensive chronic kidney disease with stage 1 through stage 4 chronic kidney disease, or unspecified chronic kidney disease: Secondary | ICD-10-CM | POA: Insufficient documentation

## 2016-06-04 DIAGNOSIS — Z8673 Personal history of transient ischemic attack (TIA), and cerebral infarction without residual deficits: Secondary | ICD-10-CM | POA: Insufficient documentation

## 2016-06-04 DIAGNOSIS — N189 Chronic kidney disease, unspecified: Secondary | ICD-10-CM | POA: Insufficient documentation

## 2016-06-04 DIAGNOSIS — Z7984 Long term (current) use of oral hypoglycemic drugs: Secondary | ICD-10-CM | POA: Diagnosis not present

## 2016-06-04 DIAGNOSIS — E119 Type 2 diabetes mellitus without complications: Secondary | ICD-10-CM | POA: Diagnosis not present

## 2016-06-04 DIAGNOSIS — I2510 Atherosclerotic heart disease of native coronary artery without angina pectoris: Secondary | ICD-10-CM | POA: Diagnosis not present

## 2016-06-04 LAB — CBC WITH DIFFERENTIAL/PLATELET
BASOS ABS: 0 10*3/uL (ref 0.0–0.1)
BASOS PCT: 0 %
EOS ABS: 0.1 10*3/uL (ref 0.0–0.7)
Eosinophils Relative: 2 %
HEMATOCRIT: 43.6 % (ref 39.0–52.0)
HEMOGLOBIN: 15 g/dL (ref 13.0–17.0)
Lymphocytes Relative: 12 %
Lymphs Abs: 1.1 10*3/uL (ref 0.7–4.0)
MCH: 29.2 pg (ref 26.0–34.0)
MCHC: 34.4 g/dL (ref 30.0–36.0)
MCV: 85 fL (ref 78.0–100.0)
MONO ABS: 0.7 10*3/uL (ref 0.1–1.0)
Monocytes Relative: 8 %
NEUTROS ABS: 7.5 10*3/uL (ref 1.7–7.7)
NEUTROS PCT: 78 %
Platelets: 196 10*3/uL (ref 150–400)
RBC: 5.13 MIL/uL (ref 4.22–5.81)
RDW: 13.8 % (ref 11.5–15.5)
WBC: 9.6 10*3/uL (ref 4.0–10.5)

## 2016-06-04 LAB — URINALYSIS, ROUTINE W REFLEX MICROSCOPIC
Bilirubin Urine: NEGATIVE
Glucose, UA: NEGATIVE mg/dL
HGB URINE DIPSTICK: NEGATIVE
Ketones, ur: NEGATIVE mg/dL
Leukocytes, UA: NEGATIVE
Nitrite: NEGATIVE
PH: 5 (ref 5.0–8.0)
Protein, ur: NEGATIVE mg/dL
Specific Gravity, Urine: 1.017 (ref 1.005–1.030)

## 2016-06-04 LAB — COMPREHENSIVE METABOLIC PANEL
ALBUMIN: 4.3 g/dL (ref 3.5–5.0)
ALT: 25 U/L (ref 17–63)
AST: 22 U/L (ref 15–41)
Alkaline Phosphatase: 58 U/L (ref 38–126)
Anion gap: 8 (ref 5–15)
BUN: 36 mg/dL — AB (ref 6–20)
CHLORIDE: 105 mmol/L (ref 101–111)
CO2: 24 mmol/L (ref 22–32)
CREATININE: 1.68 mg/dL — AB (ref 0.61–1.24)
Calcium: 9.5 mg/dL (ref 8.9–10.3)
GFR calc Af Amer: 41 mL/min — ABNORMAL LOW (ref >60)
GFR calc non Af Amer: 35 mL/min — ABNORMAL LOW (ref >60)
GLUCOSE: 62 mg/dL — AB (ref 65–99)
POTASSIUM: 4.3 mmol/L (ref 3.5–5.1)
SODIUM: 137 mmol/L (ref 135–145)
Total Bilirubin: 0.8 mg/dL (ref 0.3–1.2)
Total Protein: 8 g/dL (ref 6.5–8.1)

## 2016-06-04 LAB — I-STAT CG4 LACTIC ACID, ED: LACTIC ACID, VENOUS: 1.26 mmol/L (ref 0.5–1.9)

## 2016-06-04 LAB — I-STAT TROPONIN, ED: Troponin i, poc: 0.03 ng/mL (ref 0.00–0.08)

## 2016-06-04 LAB — INFLUENZA PANEL BY PCR (TYPE A & B)
Influenza A By PCR: NEGATIVE
Influenza B By PCR: NEGATIVE

## 2016-06-04 MED ORDER — SODIUM CHLORIDE 0.9 % IV BOLUS (SEPSIS)
500.0000 mL | Freq: Once | INTRAVENOUS | Status: AC
  Administered 2016-06-04: 500 mL via INTRAVENOUS

## 2016-06-04 MED ORDER — SODIUM CHLORIDE 0.9 % IV BOLUS (SEPSIS)
1000.0000 mL | Freq: Once | INTRAVENOUS | Status: AC
  Administered 2016-06-04: 1000 mL via INTRAVENOUS

## 2016-06-04 MED ORDER — SODIUM CHLORIDE 0.9 % IV BOLUS (SEPSIS): 1000.0000 mL | Freq: Once | INTRAVENOUS | Status: DC

## 2016-06-04 MED ORDER — ACETAMINOPHEN 325 MG PO TABS
650.0000 mg | ORAL_TABLET | Freq: Once | ORAL | Status: AC
  Administered 2016-06-04: 650 mg via ORAL
  Filled 2016-06-04: qty 2

## 2016-06-04 NOTE — ED Notes (Signed)
Bedside reporting done with Morey Hummingbird, RN

## 2016-06-04 NOTE — ED Notes (Signed)
Pt is alert and resting calmly. Pt denies pain at this time and states that he has intermittent chest tightness. Pt daughter is at bedside

## 2016-06-04 NOTE — ED Notes (Signed)
Pt is becoming anxiety

## 2016-06-04 NOTE — ED Notes (Signed)
Patient transported to X-ray 

## 2016-06-04 NOTE — ED Notes (Signed)
Dr Moses Manners phone number [phone], can be reached regarding patient per patient's daughter.

## 2016-06-04 NOTE — ED Triage Notes (Signed)
Pt daughter states the pt had a fever of 101.3. Pt took tylenol ~1 hour ago. Pt states he had diarrhea this morning.

## 2016-06-04 NOTE — ED Provider Notes (Addendum)
[facility] DEPT Provider Note   CSN: [id number] Arrival date & time: 06/04/16  1816     History   Chief Complaint Chief Complaint  Patient presents with  . Fever    HPI [name] is a 83 y.o. male.  HPI   Patient said a 11-year-old male presenting with high fever. Patient called his primary care physician was sent here for evaluation. Patient has history of CAD, COPD, recent stroke. Patient had no recent symptoms. He does report today feeling a little weaker than usual. No cough congestion, urinary symptoms,. He did have one episode of diarrhea 3 days ago.  No sick contacts.  Past Medical History:  Diagnosis Date  . Adenomatous polyp   . CAD (coronary artery disease)   . Chronic kidney disease   . Diverticulosis   . DVT (deep venous thrombosis) ([place])   . Gout   . History of pulmonary embolus (PE)    from a DVT in 2008  . Hyperglycemia   . Hyperlipidemia   . Hypertension   . Mallory-Weiss tear   . Peripheral vascular disease ([place])   . SVT (supraventricular tachycardia) ([place])   . Vitamin B12 deficiency     Patient Active Problem List   Diagnosis Date Noted  . Gait disturbance, post-stroke 11/23/2015  . Hemiparesis affecting right side as late effect of cerebrovascular accident (CVA) ([place]) 11/23/2015  . Acute blood loss anemia   . Acute lower UTI   . Right elbow pain   . Nausea without vomiting   . Urinary retention   . 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 Urinary hesitancy   . Right sided weakness   . Acute ischemic left MCA stroke ([place])   . Hematuria   . Bleeding   . Leukocytosis   . Depression   . Steroid-induced hyperglycemia   . Left pontine CVA ([place]) 10/21/2015  . Dysarthria, post-stroke   . Right hemiparesis ([place])   . Sinus arrest   . CKD (chronic kidney disease)   . DM type 2 without retinopathy ([place])   . Coronary artery disease involving native coronary artery of native heart without angina pectoris    . Acute idiopathic gout of right hand   . Aphasia   . Dysphagia, post-stroke   . HLD (hyperlipidemia)   . Benign essential HTN   . Orthostatic hypotension   . 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 CVA (cerebral infarction) 10/14/2015  . Accelerated hypertension 10/14/2015  . Type 2 diabetes mellitus with circulatory disorder ([place]) 10/14/2015  . Peripheral vascular disease, unspecified ([place]) 06/04/2013    Past Surgical History:  Procedure Laterality Date  .  PTCA of coronary lesion Right 1995  . APPENDECTOMY    . EP IMPLANTABLE DEVICE N/A 12/01/2015   Procedure: Loop Recorder Insertion;  Surgeon: Evans Lance, MD;  Location: [facility];  Service: Cardiovascular;  Laterality: N/A;  . PR VEIN BYPASS GRAFT,AORTO-FEM-POP Left    femoral-popliteal BP by Dr. Drucie Opitz  . repair of aal fissure   1978       Home Medications    Prior to Admission medications   Medication Sig Start Date End Date Taking? Authorizing Provider  acetaminophen (TYLENOL) 500 MG tablet Take 500 mg by mouth every 6 (six) hours as needed for fever.    Yes Historical Provider, MD  amLODipine (NORVASC) 10 MG tablet Take 1 tablet (10 mg total) by mouth daily. 11/12/15  Yes Lavon Paganini Angiulli, PA-C  aspirin 325 MG tablet Take 1 tablet (325 mg total) by mouth daily. 10/21/15  Yes Patrecia Pour, MD  clopidogrel (PLAVIX) 75 MG tablet Take 1 tablet (75 mg total) by mouth daily. 11/11/15  Yes Daniel J Angiulli, PA-C  Cyanocobalamin (VITAMIN B-12 PO) Take 1 tablet by mouth daily.   Yes Historical Provider, MD  famotidine (PEPCID) 20 MG tablet Take 1 tablet (20 mg total) by mouth 2 (two) times daily. Patient taking differently: Take 20 mg by mouth 2 (two) times daily as needed.  11/11/15  Yes Daniel J Angiulli, PA-C  glimepiride (AMARYL) 2 MG tablet Take 1 tablet (2 mg total) by mouth daily with breakfast. 11/12/15  Yes Daniel J Angiulli, PA-C  lisinopril  (PRINIVIL,ZESTRIL) 20 MG tablet Take 20 mg by mouth daily.   Yes Historical Provider, MD  rosuvastatin (CRESTOR) 5 MG tablet Take 5 mg by mouth daily at 6 PM.  02/04/16  Yes Historical Provider, MD  diclofenac sodium (VOLTAREN) 1 % GEL Apply 2 g topically 4 (four) times daily. Patient not taking: Reported on 06/04/2016 11/27/15   Charlett Blake, MD    Family History Family History  Problem Relation Age of Onset  . Cancer Mother     bladder  . CAD Father   . Heart attack Father     Social History Social History  Substance Use Topics  . Smoking status: Never Smoker  . Smokeless tobacco: Never Used  . Alcohol use No     Allergies   Hydralazine; Lipitor [atorvastatin]; and Zetia [ezetimibe]   Review of Systems Review of Systems  Constitutional: Positive for fatigue and fever. Negative for activity change.  HENT: Negative for congestion.   Respiratory: Positive for cough. Negative for shortness of breath.   Cardiovascular: Negative for chest pain.  Gastrointestinal: Negative for abdominal pain.  Genitourinary: Negative for dysuria.  Neurological: Negative for light-headedness.  Psychiatric/Behavioral: Positive for dysphoric mood.  All other systems reviewed and are negative.    Physical Exam Updated Vital Signs BP (!) 144/73   Pulse (!) 102   Temp 98.5 F (36.9 C) (Oral)   Resp 17   Ht 5\' 8"  (1.727 m)   Wt 180 lb (81.6 kg)   SpO2 98%   BMI 27.37 kg/m   Physical Exam  Constitutional: He is oriented to person, place, and time. He appears well-nourished.  HENT:  Head: Normocephalic.  Right Ear: External ear normal.  Left Ear: External ear normal.  Bilateral TMs normal, normal posterior pharynx.  Eyes: Conjunctivae and EOM are normal. Pupils are equal, round, and reactive to light.  Cardiovascular: Normal rate.   Pulmonary/Chest: Effort normal and breath sounds normal. No respiratory distress.  Abdominal: Soft. There is no tenderness.  Musculoskeletal: He  exhibits no edema.  Neurological: He is oriented to person, place, and time.  Mild right facial droop.  Skin: Skin is warm and dry. No rash noted. He is not diaphoretic. No erythema.  Psychiatric: He has a normal mood and affect. His behavior is normal.     ED Treatments / Results  Labs (all labs ordered are listed, but only abnormal results are displayed) Labs Reviewed  COMPREHENSIVE METABOLIC PANEL - Abnormal; Notable for the following:       Result Value   Glucose, Bld 62 (*)    BUN 36 (*)    Creatinine, Ser 1.68 (*)    GFR calc non Af Amer 35 (*)    GFR calc  Af Amer 41 (*)    All other components within normal limits  CULTURE, BLOOD (ROUTINE X 2)  CULTURE, BLOOD (ROUTINE X 2)  CBC WITH DIFFERENTIAL/PLATELET  URINALYSIS, ROUTINE W REFLEX MICROSCOPIC  INFLUENZA PANEL BY PCR (TYPE A & B)  I-STAT CG4 LACTIC ACID, ED  I-STAT TROPOININ, ED    EKG  EKG Interpretation  Date/Time:  Saturday June 04 2016 19:21:33 EDT Ventricular Rate:  97 PR Interval:    QRS Duration: 114 QT Interval:  360 QTC Calculation: 458 R Axis:   -54 Text Interpretation:  Sinus rhythm Borderline prolonged PR interval Incomplete left bundle branch block Anterior Q waves, possibly due to ILBBB No significant change since last tracing Confirmed by Gerald Leitz ([id number]) on 06/04/2016 7:33:42 PM       Radiology Dg Chest 2 View  Result Date: 06/04/2016 CLINICAL DATA:  Acute onset of intermittent generalized chest pain and fever. Nonproductive cough. Initial encounter. EXAM: CHEST  2 VIEW COMPARISON:  Chest radiograph performed 10/25/2015 FINDINGS: The lungs are well-aerated and clear. There is no evidence of focal opacification, pleural effusion or pneumothorax. The heart is normal in size; the mediastinal contour is within normal limits. No acute osseous abnormalities are seen. IMPRESSION: No acute cardiopulmonary process seen. Electronically Signed   By: Garald Balding M.D.   On: 06/04/2016 19:48     Procedures Procedures (including critical care time)  Medications Ordered in ED Medications  sodium chloride 0.9 % bolus 1,000 mL (0 mLs Intravenous Stopped 06/04/16 2242)  acetaminophen (TYLENOL) tablet 650 mg (650 mg Oral Given 06/04/16 2323)  sodium chloride 0.9 % bolus 500 mL (500 mLs Intravenous New Bag/Given 06/04/16 2320)     Initial Impression / Assessment and Plan / ED Course  I have reviewed the triage vital signs and the nursing notes.  Pertinent labs & imaging results that were available during my care of the patient were reviewed by me and considered in my medical decision making (see chart for details).     Patient is well-appearing 83 year old male history of COPD CAD and recent stroke. Presenting today with fever and no associated symptoms. We'll do fever workup.  Patient has no white count. Patient's creatinine is similar to prior. Patient's x-rays normal. Patient's urine shows no infection. Patient's not had a fever here. Patient's tachycardia responded to acetaminophen.  Patient's vital signs are normalized prior to discharge. We had a long discussion about admission versus discharge. However in weighing the risks and benefits of admission (an exposure to increased illness) we decided to discharge home. Currently not see any need for antibiotics as I don't know I'll be treating. It is possible patient just has a viral illness. We'll have him follow-up with [facility] on  Monday. Final Clinical Impressions(s) / ED Diagnoses   Final diagnoses:  Fever, unspecified fever cause    New Prescriptions New Prescriptions   No medications on file     Joline Encalada Julio Alm, MD 06/05/16 0003    Marqueze Ramcharan Julio Alm, MD 06/05/16 0004

## 2016-06-04 NOTE — ED Notes (Signed)
Pt attempted to provide urine specimen and spilled urine all over the bed.

## 2016-06-05 MED ORDER — ACETAMINOPHEN 325 MG PO TABS
325.0000 mg | ORAL_TABLET | Freq: Once | ORAL | Status: AC
  Administered 2016-06-05: 325 mg via ORAL
  Filled 2016-06-05: qty 1

## 2016-06-05 NOTE — ED Notes (Signed)
PT temp was 101 orally prior to d/c MD made aware and ordered second dose of tylenol. Pt to finish 500cc bolus prior to DC, and recheck temp.

## 2016-06-05 NOTE — Discharge Instructions (Signed)
They came here today because he had a fever. He had no symptoms. Your x-ray here urine and your labs are all reassuring. You had no temperature while you're here in the emergency department. Please continue to follow up to check temperatures at home and use acetaminophen 500 mg to treat.   Likely discussed, although currently there is no reason to admit you if you have increased symptoms or if you have any change in symptoms. Or any other concerns please return immediately to the emergency department as things may have changed.

## 2016-06-10 LAB — CULTURE, BLOOD (ROUTINE X 2)
CULTURE: NO GROWTH
Culture: NO GROWTH
SPECIAL REQUESTS: ADEQUATE
Special Requests: ADEQUATE

## 2016-06-28 ENCOUNTER — Ambulatory Visit (INDEPENDENT_AMBULATORY_CARE_PROVIDER_SITE_OTHER): Payer: Medicare Other | Admitting: *Deleted

## 2016-06-28 DIAGNOSIS — I639 Cerebral infarction, unspecified: Secondary | ICD-10-CM

## 2016-06-28 NOTE — Progress Notes (Signed)
Carelink Summary Report / Loop Recorder 

## 2016-07-10 LAB — CUP PACEART REMOTE DEVICE CHECK
Date Time Interrogation Session: 20180501140725
Implantable Pulse Generator Implant Date: 20171003

## 2016-07-10 NOTE — Progress Notes (Signed)
Carelink summary report received. Battery status OK. Normal device function. No new symptom episodes, tachy episodes, brady, or pause episodes. No new AF episodes. Monthly summary reports and ROV/PRN 

## 2016-07-27 ENCOUNTER — Ambulatory Visit (INDEPENDENT_AMBULATORY_CARE_PROVIDER_SITE_OTHER): Payer: Medicare Other | Admitting: Adult Health

## 2016-07-27 ENCOUNTER — Encounter: Payer: Self-pay | Admitting: Adult Health

## 2016-07-27 VITALS — BP 129/72 | HR 82 | Wt 178.2 lb

## 2016-07-27 DIAGNOSIS — I6389 Other cerebral infarction: Secondary | ICD-10-CM

## 2016-07-27 DIAGNOSIS — I639 Cerebral infarction, unspecified: Secondary | ICD-10-CM

## 2016-07-27 DIAGNOSIS — I6350 Cerebral infarction due to unspecified occlusion or stenosis of unspecified cerebral artery: Secondary | ICD-10-CM

## 2016-07-27 NOTE — Progress Notes (Signed)
I have read the note, and I agree with the clinical assessment and plan.  Kyira Volkert KEITH   

## 2016-07-27 NOTE — Progress Notes (Signed)
PATIENT: [name] DOB: 26-Jan-1928  REASON FOR VISIT: follow up stroke HISTORY FROM: patient and daughter  HISTORY OF PRESENT ILLNESS: [name] is an 83 year old male with a history of left temporal and left pontine stroke that occurred in August 2017. He returns today for follow-up. He remains on aspirin and Plavix. He is tolerating this well. He states occasionally he will have drooling on the right side. This has been remittent since his stroke. Occasionally he does have emotional outbursts in the form of crying. He states this is not frequent enough that it interferes with his quality of life. In the hospital they did try to different medications however the patient nor his daughter remember the names. Patient's primary care is managing his diabetes and cholesterol. He does see a cardiologist as well. He had a loop recorder placed- no indication of atrial fibrillation. The patient denies any additional strokelike symptoms. He has report this past Sunday he did have an episode of diplopia but this cleared up fairly quickly. He denies any new neurological returns today for an evaluation.  HISTORY 02/25/16: [name] is an 83 year old right-handed white male with a history of a left temporal and left pontine stroke that occurred in August 2017. The patient sustained a right hemiparesis and a gait disorder. He has undergone 3 months of home health physical therapy, he is currently able to walk up and down stairs with minimal assistance. He uses a cane for ambulation. He has had one fall that occurred when he was stooping over to pick up something. The patient reports some occasional crawling sensations on the left side of the head, but no discomfort. He has had some problems with urinary frequency at night since the stroke. Family reports some mild pseudobulbar affect, no medications were initiated. The patient has seen a urologist for the bladder, no medications were prescribed. He returns for an  evaluation. He remains on aspirin and Plavix. A loop recorder has been placed, no reports of atrial fibrillation have occurred.   REVIEW OF SYSTEMS: Out of a complete 14 system review of symptoms, the patient complains only of the following symptoms, and all other reviewed systems are negative.  Nausea, frequent waking, muscle cramps, cough, double vision, runny nose, drooling  ALLERGIES: Allergies  Allergen Reactions  . Hydralazine Nausea Only  . Lipitor [Atorvastatin]     Muscle pain   . Zetia [Ezetimibe] Hives    HOME MEDICATIONS: Outpatient Medications Prior to Visit  Medication Sig Dispense Refill  . acetaminophen (TYLENOL) 500 MG tablet Take 500 mg by mouth every 6 (six) hours as needed for fever.     Marland Kitchen amLODipine (NORVASC) 10 MG tablet Take 1 tablet (10 mg total) by mouth daily. 30 tablet 0  . aspirin 325 MG tablet Take 1 tablet (325 mg total) by mouth daily.    . clopidogrel (PLAVIX) 75 MG tablet Take 1 tablet (75 mg total) by mouth daily. 30 tablet 1  . Cyanocobalamin (VITAMIN B-12 PO) Take 1 tablet by mouth daily.    . diclofenac sodium (VOLTAREN) 1 % GEL Apply 2 g topically 4 (four) times daily. 300 g 1  . famotidine (PEPCID) 20 MG tablet Take 1 tablet (20 mg total) by mouth 2 (two) times daily. (Patient taking differently: Take 20 mg by mouth 2 (two) times daily as needed. ) 60 tablet 0  . glimepiride (AMARYL) 2 MG tablet Take 1 tablet (2 mg total) by mouth daily with breakfast. 30 tablet 1  .  lisinopril (PRINIVIL,ZESTRIL) 20 MG tablet Take 20 mg by mouth daily.    . rosuvastatin (CRESTOR) 5 MG tablet Take 5 mg by mouth daily at 6 PM.      No facility-administered medications prior to visit.     PAST MEDICAL HISTORY: Past Medical History:  Diagnosis Date  . Adenomatous polyp   . CAD (coronary artery disease)   . Chronic kidney disease   . Diverticulosis   . DVT (deep venous thrombosis) ([place])   . Gout   . History of pulmonary embolus (PE)    from a DVT in 2008    . Hyperglycemia   . Hyperlipidemia   . Hypertension   . Mallory-Weiss tear   . Peripheral vascular disease ([place])   . Stroke ([place]) 09/2015  . SVT (supraventricular tachycardia) ([place])   . Vitamin B12 deficiency     PAST SURGICAL HISTORY: Past Surgical History:  Procedure Laterality Date  .  PTCA of coronary lesion Right 1995  . APPENDECTOMY    . EP IMPLANTABLE DEVICE N/A 12/01/2015   Procedure: Loop Recorder Insertion;  Surgeon: Evans Lance, MD;  Location: [facility];  Service: Cardiovascular;  Laterality: N/A;  . PR VEIN BYPASS GRAFT,AORTO-FEM-POP Left    femoral-popliteal BP by Dr. Drucie Opitz  . repair of aal fissure   1978    FAMILY HISTORY: Family History  Problem Relation Age of Onset  . Cancer Mother        bladder  . CAD Father   . Heart attack Father     SOCIAL HISTORY: Social History   Social History  . Marital status: Widowed    Spouse name: N/A  . Number of children: N/A  . Years of education: N/A   Occupational History  . Retired    Social History Main Topics  . Smoking status: Never Smoker  . Smokeless tobacco: Never Used  . Alcohol use No  . Drug use: No  . Sexual activity: Not on file   Other Topics Concern  . Not on file   Social History Narrative   Lives at home w/ his daughter   Right-handed   Caffeine: 1 cup per day      PHYSICAL EXAM  Vitals:   07/27/16 1405  BP: 129/72  Pulse: 82  Weight: 178 lb 3.2 oz (80.8 kg)   Body mass index is 27.1 kg/m.  Generalized: Well developed, in no acute distress   Neurological examination  Mentation: Alert oriented to time, place, history taking. Follows all commands speech and language fluent Cranial nerve II-XII: Pupils were equal round reactive to light. Extraocular movements were full, visual field were full on confrontational test. Facial sensation and strength were normal. Uvula tongue midline. Head turning and shoulder shrug  were normal and symmetric. Motor: The motor  testing reveals 5 over 5 strength of all 4 extremities.  Sensory: Sensory testing is intact to soft touch on all 4 extremities. No evidence of extinction is noted.  Coordination: Cerebellar testing reveals good finger-nose-finger and heel-to-shin bilaterally.  Gait and station: Patient uses a cane on the right. Gait is slow and slightly unsteady. Tandem gait is not attempted. Romberg was negative. Reflexes: Deep tendon reflexes are slightly more reactive on the right side compared to the left.  DIAGNOSTIC DATA (LABS, IMAGING, TESTING) - I reviewed patient records, labs, notes, testing and imaging myself where available.  Lab Results  Component Value Date   WBC 9.6 06/04/2016   HGB 15.0 06/04/2016  HCT 43.6 06/04/2016   MCV 85.0 06/04/2016   PLT 196 06/04/2016      Component Value Date/Time   NA 137 06/04/2016 2011   K 4.3 06/04/2016 2011   CL 105 06/04/2016 2011   CO2 24 06/04/2016 2011   GLUCOSE 62 (L) 06/04/2016 2011   BUN 36 (H) 06/04/2016 2011   CREATININE 1.68 (H) 06/04/2016 2011   CALCIUM 9.5 06/04/2016 2011   PROT 8.0 06/04/2016 2011   ALBUMIN 4.3 06/04/2016 2011   AST 22 06/04/2016 2011   ALT 25 06/04/2016 2011   ALKPHOS 58 06/04/2016 2011   BILITOT 0.8 06/04/2016 2011   GFRNONAA 35 (L) 06/04/2016 2011   GFRAA 41 (L) 06/04/2016 2011   Lab Results  Component Value Date   CHOL 207 (H) 10/17/2015   HDL 34 (L) 10/17/2015   LDLCALC 141 (H) 10/17/2015   TRIG 159 (H) 10/17/2015   CHOLHDL 6.1 10/17/2015   Lab Results  Component Value Date   HGBA1C 7.3 (H) 10/17/2015   No results found for: [id number] Lab Results  Component Value Date   TSH 1.852 10/21/2015      ASSESSMENT AND PLAN 83 y.o. year old male  has a past medical history of Adenomatous polyp; CAD (coronary artery disease); Chronic kidney disease; Diverticulosis; DVT (deep venous thrombosis) ([place]); Gout; History of pulmonary embolus (PE); Hyperglycemia; Hyperlipidemia; Hypertension; Mallory-Weiss  tear; Peripheral vascular disease ([place]); Stroke [facility]) (09/2015); SVT (supraventricular tachycardia) ([place]); and Vitamin B12 deficiency. here with:  1. Left pontine stroke and left temporal stroke  Overall the patient has remained stable. He has mild pseudo-bulbar affect. He does not wish to be on any medication at this time. He will continue on aspirin and Plavix for secondary stroke prevention. He should maintain strict control of his blood pressure with goal less than 130/90, cholesterol LDL less than 70 and hemoglobin A1c less than 6.5%. He is advised that if he has any additional strokelike symptoms he should call 911 or have someone take him to the emergency room. The patient is encouraged to continue have regular follow-ups with his primary care provider. He will follow up with our office on an as-needed basis.   I spent 15 minutes with the patient. 50% of this time was spent counseling the patient and his daughter on stroke risk factors.   Ward Givens, MSN, NP-C 07/27/2016, 2:14 PM [facility] [address], [place], [place] [address] [phone]

## 2016-07-27 NOTE — Patient Instructions (Signed)
Continue Aspirin and Plavix Blood pressure goal <130/90 Cholesterol LDL < 70 HgbA1c <6.5 % If your symptoms worsen or you develop new symptoms please let us know.

## 2016-07-28 ENCOUNTER — Ambulatory Visit (INDEPENDENT_AMBULATORY_CARE_PROVIDER_SITE_OTHER): Payer: Medicare Other | Admitting: *Deleted

## 2016-07-28 DIAGNOSIS — I639 Cerebral infarction, unspecified: Secondary | ICD-10-CM | POA: Diagnosis not present

## 2016-07-28 NOTE — Progress Notes (Signed)
Carelink Summary Report 

## 2016-07-30 LAB — CUP PACEART REMOTE DEVICE CHECK
Date Time Interrogation Session: 20180531140753
MDC IDC PG IMPLANT DT: 20171003

## 2016-07-30 NOTE — Progress Notes (Signed)
Carelink summary report received. Battery status OK. Normal device function. No new symptom episodes, tachy episodes, brady, or pause episodes. No new AF episodes. Monthly summary reports and ROV/PRN 

## 2016-08-23 ENCOUNTER — Other Ambulatory Visit: Payer: Self-pay | Admitting: Geriatric Medicine

## 2016-08-24 ENCOUNTER — Other Ambulatory Visit (HOSPITAL_COMMUNITY): Payer: Self-pay | Admitting: Geriatric Medicine

## 2016-08-24 DIAGNOSIS — R1310 Dysphagia, unspecified: Secondary | ICD-10-CM

## 2016-08-29 ENCOUNTER — Ambulatory Visit (INDEPENDENT_AMBULATORY_CARE_PROVIDER_SITE_OTHER): Payer: Medicare Other | Admitting: *Deleted

## 2016-08-29 DIAGNOSIS — I639 Cerebral infarction, unspecified: Secondary | ICD-10-CM | POA: Diagnosis not present

## 2016-08-29 NOTE — Progress Notes (Signed)
Carelink Summary Report / Loop Recorder 

## 2016-09-06 ENCOUNTER — Other Ambulatory Visit (HOSPITAL_COMMUNITY): Payer: Self-pay | Admitting: Geriatric Medicine

## 2016-09-06 ENCOUNTER — Ambulatory Visit (HOSPITAL_COMMUNITY)
Admission: RE | Admit: 2016-09-06 | Discharge: 2016-09-06 | Disposition: A | Discharge status: Home / Self Care | Payer: Medicare Other | Source: Ambulatory Visit | Attending: Geriatric Medicine | Admitting: Geriatric Medicine

## 2016-09-06 DIAGNOSIS — R1319 Other dysphagia: Secondary | ICD-10-CM

## 2016-09-06 DIAGNOSIS — R1310 Dysphagia, unspecified: Secondary | ICD-10-CM | POA: Insufficient documentation

## 2016-09-14 LAB — CUP PACEART REMOTE DEVICE CHECK
MDC IDC PG IMPLANT DT: 20171003
MDC IDC SESS DTM: 20180630144035

## 2016-09-26 ENCOUNTER — Ambulatory Visit (INDEPENDENT_AMBULATORY_CARE_PROVIDER_SITE_OTHER): Payer: Medicare Other | Admitting: *Deleted

## 2016-09-26 DIAGNOSIS — I639 Cerebral infarction, unspecified: Secondary | ICD-10-CM

## 2016-09-27 NOTE — Progress Notes (Signed)
Carelink Summary Report / Loop Recorder 

## 2016-10-08 LAB — CUP PACEART REMOTE DEVICE CHECK
MDC IDC PG IMPLANT DT: 20171003
MDC IDC SESS DTM: 20180730154031

## 2016-10-08 NOTE — Progress Notes (Signed)
Carelink summary report received. Battery status OK. Normal device function. No new symptom episodes, tachy episodes, brady, or pause episodes. No new AF episodes. Monthly summary reports and ROV/PRN 

## 2016-10-26 ENCOUNTER — Ambulatory Visit (INDEPENDENT_AMBULATORY_CARE_PROVIDER_SITE_OTHER): Payer: Medicare Other | Admitting: *Deleted

## 2016-10-26 DIAGNOSIS — I639 Cerebral infarction, unspecified: Secondary | ICD-10-CM | POA: Diagnosis not present

## 2016-10-27 LAB — CUP PACEART REMOTE DEVICE CHECK
Implantable Pulse Generator Implant Date: 20171003
MDC IDC SESS DTM: 20180830004001

## 2016-10-27 NOTE — Progress Notes (Signed)
Carelink Summary Report / Loop Recorder 

## 2016-11-25 ENCOUNTER — Ambulatory Visit (INDEPENDENT_AMBULATORY_CARE_PROVIDER_SITE_OTHER): Payer: Medicare Other | Admitting: *Deleted

## 2016-11-25 DIAGNOSIS — I639 Cerebral infarction, unspecified: Secondary | ICD-10-CM

## 2016-11-28 NOTE — Progress Notes (Signed)
Carelink Summary Report / Loop Recorder 

## 2016-11-29 LAB — CUP PACEART REMOTE DEVICE CHECK
Implantable Pulse Generator Implant Date: 20171003
MDC IDC SESS DTM: 20180929013806

## 2016-12-26 ENCOUNTER — Ambulatory Visit (INDEPENDENT_AMBULATORY_CARE_PROVIDER_SITE_OTHER): Payer: Medicare Other | Admitting: *Deleted

## 2016-12-26 DIAGNOSIS — I639 Cerebral infarction, unspecified: Secondary | ICD-10-CM

## 2016-12-27 NOTE — Progress Notes (Signed)
Carelink Summary Report / Loop Recorder 

## 2016-12-30 LAB — CUP PACEART REMOTE DEVICE CHECK
Date Time Interrogation Session: 20181029013935
MDC IDC PG IMPLANT DT: 20171003

## 2017-01-24 ENCOUNTER — Ambulatory Visit (INDEPENDENT_AMBULATORY_CARE_PROVIDER_SITE_OTHER): Payer: Medicare Other | Admitting: *Deleted

## 2017-01-24 DIAGNOSIS — I639 Cerebral infarction, unspecified: Secondary | ICD-10-CM | POA: Diagnosis not present

## 2017-01-25 NOTE — Progress Notes (Signed)
Carelink Summary Report / Loop Recorder 

## 2017-02-09 LAB — CUP PACEART REMOTE DEVICE CHECK
Implantable Pulse Generator Implant Date: 20171003
MDC IDC SESS DTM: 20181128023220

## 2017-02-23 ENCOUNTER — Ambulatory Visit (INDEPENDENT_AMBULATORY_CARE_PROVIDER_SITE_OTHER): Payer: Medicare Other | Admitting: *Deleted

## 2017-02-23 DIAGNOSIS — I639 Cerebral infarction, unspecified: Secondary | ICD-10-CM | POA: Diagnosis not present

## 2017-02-24 NOTE — Progress Notes (Signed)
Carelink Summary Report / Loop Recorder 

## 2017-03-06 LAB — CUP PACEART REMOTE DEVICE CHECK
MDC IDC PG IMPLANT DT: 20171003
MDC IDC SESS DTM: 20181228014009

## 2017-03-27 ENCOUNTER — Ambulatory Visit (INDEPENDENT_AMBULATORY_CARE_PROVIDER_SITE_OTHER): Payer: Medicare Other | Admitting: *Deleted

## 2017-03-27 DIAGNOSIS — I639 Cerebral infarction, unspecified: Secondary | ICD-10-CM

## 2017-03-27 NOTE — Progress Notes (Signed)
Carelink Summary Report / Loop Recorder 

## 2017-04-06 LAB — CUP PACEART REMOTE DEVICE CHECK
Date Time Interrogation Session: 20190127023928
Implantable Pulse Generator Implant Date: 20171003

## 2017-04-27 ENCOUNTER — Ambulatory Visit (INDEPENDENT_AMBULATORY_CARE_PROVIDER_SITE_OTHER): Payer: Medicare Other | Admitting: *Deleted

## 2017-04-27 DIAGNOSIS — I639 Cerebral infarction, unspecified: Secondary | ICD-10-CM

## 2017-04-28 NOTE — Progress Notes (Signed)
Carelink Summary Report / Loop Recorder 

## 2017-05-30 ENCOUNTER — Ambulatory Visit (INDEPENDENT_AMBULATORY_CARE_PROVIDER_SITE_OTHER): Payer: Medicare Other | Admitting: *Deleted

## 2017-05-30 DIAGNOSIS — I639 Cerebral infarction, unspecified: Secondary | ICD-10-CM

## 2017-05-31 NOTE — Progress Notes (Signed)
Carelink Summary Report / Loop Recorder 

## 2017-06-05 LAB — CUP PACEART REMOTE DEVICE CHECK
Implantable Pulse Generator Implant Date: 20171003
MDC IDC SESS DTM: 20190301023546

## 2017-07-03 ENCOUNTER — Ambulatory Visit (INDEPENDENT_AMBULATORY_CARE_PROVIDER_SITE_OTHER): Payer: Medicare Other | Admitting: *Deleted

## 2017-07-03 DIAGNOSIS — I639 Cerebral infarction, unspecified: Secondary | ICD-10-CM | POA: Diagnosis not present

## 2017-07-03 NOTE — Progress Notes (Signed)
Carelink Summary Report / Loop Recorder 

## 2017-07-05 LAB — CUP PACEART REMOTE DEVICE CHECK
Date Time Interrogation Session: 20190403033914
MDC IDC PG IMPLANT DT: 20171003

## 2017-07-10 ENCOUNTER — Telehealth: Payer: Self-pay | Admitting: *Deleted

## 2017-07-10 NOTE — Telephone Encounter (Signed)
-----   Message from Evans Lance, MD sent at 07/10/2017  8:03 AM EDT ----- Remote device check reviewed. Histograms appropriate. Leads and battery stable for patient. Follow up as outlined above. No recommended changes.Pause noted.Call for symptoms.

## 2017-07-10 NOTE — Telephone Encounter (Signed)
Called patient to see if he had any sx's with the pause episode recorded on 04/24/17. Patient states that he cannot remember having any sx's. I instructed patient to call if he develops sx's of syncope/presyncope. Patient verbalized understanding.

## 2017-07-25 LAB — CUP PACEART REMOTE DEVICE CHECK
Implantable Pulse Generator Implant Date: 20171003
MDC IDC SESS DTM: 20190506084103

## 2017-07-26 ENCOUNTER — Telehealth: Payer: Self-pay | Admitting: Cardiology

## 2017-07-26 NOTE — Telephone Encounter (Signed)
LMOVM requesting that pt send manual transmission b/c home monitor has not updated in at least 14 days.    

## 2017-08-01 ENCOUNTER — Encounter: Payer: Self-pay | Admitting: Cardiology

## 2017-08-07 ENCOUNTER — Ambulatory Visit (INDEPENDENT_AMBULATORY_CARE_PROVIDER_SITE_OTHER): Payer: Medicare Other | Admitting: *Deleted

## 2017-08-07 DIAGNOSIS — I639 Cerebral infarction, unspecified: Secondary | ICD-10-CM | POA: Diagnosis not present

## 2017-08-07 NOTE — Progress Notes (Signed)
Carelink Summary Report / Loop Recorder 

## 2017-09-07 ENCOUNTER — Ambulatory Visit (INDEPENDENT_AMBULATORY_CARE_PROVIDER_SITE_OTHER): Payer: Medicare Other | Admitting: *Deleted

## 2017-09-07 DIAGNOSIS — I639 Cerebral infarction, unspecified: Secondary | ICD-10-CM | POA: Diagnosis not present

## 2017-09-07 NOTE — Progress Notes (Signed)
Carelink Summary Report / Loop Recorder 

## 2017-09-12 LAB — CUP PACEART REMOTE DEVICE CHECK
Date Time Interrogation Session: 20190608083523
MDC IDC PG IMPLANT DT: 20171003

## 2017-10-10 ENCOUNTER — Ambulatory Visit (INDEPENDENT_AMBULATORY_CARE_PROVIDER_SITE_OTHER): Payer: Medicare Other | Admitting: *Deleted

## 2017-10-10 DIAGNOSIS — I639 Cerebral infarction, unspecified: Secondary | ICD-10-CM | POA: Diagnosis not present

## 2017-10-10 NOTE — Progress Notes (Signed)
Carelink Summary Report / Loop Recorder 

## 2017-10-19 LAB — CUP PACEART REMOTE DEVICE CHECK
Date Time Interrogation Session: 20190711104222
Implantable Pulse Generator Implant Date: 20171003

## 2017-10-25 ENCOUNTER — Telehealth: Payer: Self-pay

## 2017-10-25 NOTE — Telephone Encounter (Signed)
LMOVM requesting that pt send manual transmission b/c home monitor has not updated in at least 14 days.   Spoke w/ pt and requested that he send a manual transmission b/c his home monitor has not updated in at least 14 days.   

## 2017-11-13 ENCOUNTER — Ambulatory Visit (INDEPENDENT_AMBULATORY_CARE_PROVIDER_SITE_OTHER): Payer: Medicare Other | Admitting: *Deleted

## 2017-11-13 DIAGNOSIS — I639 Cerebral infarction, unspecified: Secondary | ICD-10-CM | POA: Diagnosis not present

## 2017-11-13 NOTE — Progress Notes (Signed)
Carelink Summary Report / Loop Recorder 

## 2017-11-16 LAB — CUP PACEART REMOTE DEVICE CHECK
Implantable Pulse Generator Implant Date: 20171003
MDC IDC SESS DTM: 20190813134034

## 2017-11-26 LAB — CUP PACEART REMOTE DEVICE CHECK
Date Time Interrogation Session: 20190915140845
MDC IDC PG IMPLANT DT: 20171003

## 2017-12-15 ENCOUNTER — Ambulatory Visit (INDEPENDENT_AMBULATORY_CARE_PROVIDER_SITE_OTHER): Payer: Medicare Other | Admitting: *Deleted

## 2017-12-15 DIAGNOSIS — I639 Cerebral infarction, unspecified: Secondary | ICD-10-CM

## 2017-12-15 NOTE — Progress Notes (Signed)
Carelink Summary Report / Loop Recorder 

## 2017-12-25 ENCOUNTER — Telehealth: Payer: Self-pay | Admitting: Cardiology

## 2017-12-25 NOTE — Telephone Encounter (Signed)
Spoke w/ pt daughter and requested that he send a manual transmission b/c his home monitor has not updated in at least 14 days.   

## 2018-01-02 LAB — CUP PACEART REMOTE DEVICE CHECK
Date Time Interrogation Session: 20191018141048
MDC IDC PG IMPLANT DT: 20171003

## 2018-01-17 ENCOUNTER — Ambulatory Visit (INDEPENDENT_AMBULATORY_CARE_PROVIDER_SITE_OTHER): Payer: Medicare Other

## 2018-01-17 DIAGNOSIS — I639 Cerebral infarction, unspecified: Secondary | ICD-10-CM

## 2018-01-18 NOTE — Progress Notes (Signed)
Carelink Summary Report / Loop Recorder 

## 2018-02-14 ENCOUNTER — Telehealth: Payer: Self-pay

## 2018-02-14 NOTE — Telephone Encounter (Signed)
Attempted to confirm remote transmission with pt. No answer and was unable to leave a message.  Pt monitor has not updated within 14 days.

## 2018-02-19 ENCOUNTER — Ambulatory Visit (INDEPENDENT_AMBULATORY_CARE_PROVIDER_SITE_OTHER): Payer: Medicare Other

## 2018-02-19 DIAGNOSIS — I639 Cerebral infarction, unspecified: Secondary | ICD-10-CM

## 2018-02-19 LAB — CUP PACEART REMOTE DEVICE CHECK
MDC IDC PG IMPLANT DT: 20171003
MDC IDC SESS DTM: 20191223144159

## 2018-02-22 NOTE — Progress Notes (Signed)
Carelink Summary Report / Loop Recorder 

## 2018-03-04 LAB — CUP PACEART REMOTE DEVICE CHECK
Implantable Pulse Generator Implant Date: 20171003
MDC IDC SESS DTM: 20191120144106

## 2018-03-26 ENCOUNTER — Ambulatory Visit (INDEPENDENT_AMBULATORY_CARE_PROVIDER_SITE_OTHER): Payer: Medicare Other

## 2018-03-26 DIAGNOSIS — I639 Cerebral infarction, unspecified: Secondary | ICD-10-CM | POA: Diagnosis not present

## 2018-03-27 LAB — CUP PACEART REMOTE DEVICE CHECK
Implantable Pulse Generator Implant Date: 20171003
MDC IDC SESS DTM: 20200125150621

## 2018-03-27 NOTE — Progress Notes (Signed)
Carelink Summary Report / Loop Recorder 

## 2018-04-26 ENCOUNTER — Ambulatory Visit (INDEPENDENT_AMBULATORY_CARE_PROVIDER_SITE_OTHER): Payer: Medicare Other | Admitting: *Deleted

## 2018-04-26 DIAGNOSIS — I639 Cerebral infarction, unspecified: Secondary | ICD-10-CM

## 2018-04-27 LAB — CUP PACEART REMOTE DEVICE CHECK
Implantable Pulse Generator Implant Date: 20171003
MDC IDC SESS DTM: 20200227153616

## 2018-05-02 NOTE — Progress Notes (Signed)
Carelink Summary Report / Loop Recorder 

## 2018-05-29 ENCOUNTER — Ambulatory Visit (INDEPENDENT_AMBULATORY_CARE_PROVIDER_SITE_OTHER): Payer: Medicare Other | Admitting: *Deleted

## 2018-05-29 ENCOUNTER — Other Ambulatory Visit: Payer: Self-pay

## 2018-05-29 DIAGNOSIS — I639 Cerebral infarction, unspecified: Secondary | ICD-10-CM | POA: Diagnosis not present

## 2018-05-29 LAB — CUP PACEART REMOTE DEVICE CHECK
Date Time Interrogation Session: 20200331150251
MDC IDC PG IMPLANT DT: 20171003

## 2018-06-06 NOTE — Progress Notes (Signed)
Carelink Summary Report / Loop Recorder 

## 2018-07-02 ENCOUNTER — Ambulatory Visit (INDEPENDENT_AMBULATORY_CARE_PROVIDER_SITE_OTHER): Payer: Medicare Other | Admitting: *Deleted

## 2018-07-02 ENCOUNTER — Other Ambulatory Visit: Payer: Self-pay

## 2018-07-02 DIAGNOSIS — I639 Cerebral infarction, unspecified: Secondary | ICD-10-CM

## 2018-07-02 LAB — CUP PACEART REMOTE DEVICE CHECK
Date Time Interrogation Session: 20200503174032
Implantable Pulse Generator Implant Date: 20171003

## 2018-07-09 NOTE — Progress Notes (Signed)
Carelink Summary Report / Loop Recorder 

## 2018-08-03 ENCOUNTER — Ambulatory Visit (INDEPENDENT_AMBULATORY_CARE_PROVIDER_SITE_OTHER): Payer: Medicare Other | Admitting: *Deleted

## 2018-08-03 DIAGNOSIS — I639 Cerebral infarction, unspecified: Secondary | ICD-10-CM

## 2018-08-03 LAB — CUP PACEART REMOTE DEVICE CHECK
Date Time Interrogation Session: 20200605175053
Implantable Pulse Generator Implant Date: 20171003

## 2018-08-10 NOTE — Progress Notes (Signed)
Carelink Summary Report / Loop Recorder 

## 2018-09-05 ENCOUNTER — Encounter: Payer: Medicare Other | Admitting: *Deleted

## 2018-10-08 ENCOUNTER — Ambulatory Visit (INDEPENDENT_AMBULATORY_CARE_PROVIDER_SITE_OTHER): Payer: Medicare Other | Admitting: *Deleted

## 2018-10-08 DIAGNOSIS — I639 Cerebral infarction, unspecified: Secondary | ICD-10-CM | POA: Diagnosis not present

## 2018-10-08 LAB — CUP PACEART REMOTE DEVICE CHECK
Date Time Interrogation Session: 20200810201040
Implantable Pulse Generator Implant Date: 20171003

## 2018-10-15 ENCOUNTER — Emergency Department (HOSPITAL_COMMUNITY): Payer: Medicare Other

## 2018-10-15 ENCOUNTER — Encounter (HOSPITAL_COMMUNITY): Payer: Self-pay | Admitting: Emergency Medicine

## 2018-10-15 ENCOUNTER — Other Ambulatory Visit: Payer: Self-pay

## 2018-10-15 ENCOUNTER — Emergency Department (HOSPITAL_COMMUNITY)
Admission: EM | Admit: 2018-10-15 | Discharge: 2018-10-15 | Disposition: A | Discharge status: Home / Self Care | Payer: Medicare Other | Attending: Emergency Medicine | Admitting: Emergency Medicine

## 2018-10-15 DIAGNOSIS — Z888 Allergy status to other drugs, medicaments and biological substances status: Secondary | ICD-10-CM | POA: Insufficient documentation

## 2018-10-15 DIAGNOSIS — Z79899 Other long term (current) drug therapy: Secondary | ICD-10-CM | POA: Insufficient documentation

## 2018-10-15 DIAGNOSIS — Z951 Presence of aortocoronary bypass graft: Secondary | ICD-10-CM | POA: Insufficient documentation

## 2018-10-15 DIAGNOSIS — R112 Nausea with vomiting, unspecified: Secondary | ICD-10-CM

## 2018-10-15 DIAGNOSIS — R5383 Other fatigue: Secondary | ICD-10-CM | POA: Diagnosis not present

## 2018-10-15 DIAGNOSIS — I69359 Hemiplegia and hemiparesis following cerebral infarction affecting unspecified side: Secondary | ICD-10-CM | POA: Insufficient documentation

## 2018-10-15 DIAGNOSIS — N182 Chronic kidney disease, stage 2 (mild): Secondary | ICD-10-CM | POA: Diagnosis not present

## 2018-10-15 DIAGNOSIS — E785 Hyperlipidemia, unspecified: Secondary | ICD-10-CM | POA: Diagnosis not present

## 2018-10-15 DIAGNOSIS — Z7982 Long term (current) use of aspirin: Secondary | ICD-10-CM | POA: Insufficient documentation

## 2018-10-15 DIAGNOSIS — E1122 Type 2 diabetes mellitus with diabetic chronic kidney disease: Secondary | ICD-10-CM | POA: Diagnosis not present

## 2018-10-15 DIAGNOSIS — Z7984 Long term (current) use of oral hypoglycemic drugs: Secondary | ICD-10-CM | POA: Insufficient documentation

## 2018-10-15 DIAGNOSIS — R29810 Facial weakness: Secondary | ICD-10-CM | POA: Insufficient documentation

## 2018-10-15 DIAGNOSIS — I129 Hypertensive chronic kidney disease with stage 1 through stage 4 chronic kidney disease, or unspecified chronic kidney disease: Secondary | ICD-10-CM | POA: Diagnosis not present

## 2018-10-15 DIAGNOSIS — I2510 Atherosclerotic heart disease of native coronary artery without angina pectoris: Secondary | ICD-10-CM | POA: Diagnosis not present

## 2018-10-15 LAB — DIFFERENTIAL
Abs Immature Granulocytes: 0.14 10*3/uL — ABNORMAL HIGH (ref 0.00–0.07)
Basophils Absolute: 0 10*3/uL (ref 0.0–0.1)
Basophils Relative: 0 %
Eosinophils Absolute: 0 10*3/uL (ref 0.0–0.5)
Eosinophils Relative: 0 %
Immature Granulocytes: 1 %
Lymphocytes Relative: 11 %
Lymphs Abs: 1.5 10*3/uL (ref 0.7–4.0)
Monocytes Absolute: 0.7 10*3/uL (ref 0.1–1.0)
Monocytes Relative: 5 %
Neutro Abs: 11.8 10*3/uL — ABNORMAL HIGH (ref 1.7–7.7)
Neutrophils Relative %: 83 %

## 2018-10-15 LAB — COMPREHENSIVE METABOLIC PANEL
ALT: 25 U/L (ref 0–44)
AST: 24 U/L (ref 15–41)
Albumin: 4 g/dL (ref 3.5–5.0)
Alkaline Phosphatase: 62 U/L (ref 38–126)
Anion gap: 13 (ref 5–15)
BUN: 21 mg/dL (ref 8–23)
CO2: 19 mmol/L — ABNORMAL LOW (ref 22–32)
Calcium: 9.6 mg/dL (ref 8.9–10.3)
Chloride: 105 mmol/L (ref 98–111)
Creatinine, Ser: 1.99 mg/dL — ABNORMAL HIGH (ref 0.61–1.24)
GFR calc Af Amer: 34 mL/min — ABNORMAL LOW (ref >60)
GFR calc non Af Amer: 29 mL/min — ABNORMAL LOW (ref >60)
Glucose, Bld: 279 mg/dL — ABNORMAL HIGH (ref 70–99)
Potassium: 4.5 mmol/L (ref 3.5–5.1)
Sodium: 137 mmol/L (ref 135–145)
Total Bilirubin: 0.7 mg/dL (ref 0.3–1.2)
Total Protein: 8 g/dL (ref 6.5–8.1)

## 2018-10-15 LAB — I-STAT CHEM 8, ED
BUN: 24 mg/dL — ABNORMAL HIGH (ref 8–23)
Calcium, Ion: 1.24 mmol/L (ref 1.15–1.40)
Chloride: 106 mmol/L (ref 98–111)
Creatinine, Ser: 1.7 mg/dL — ABNORMAL HIGH (ref 0.61–1.24)
Glucose, Bld: 281 mg/dL — ABNORMAL HIGH (ref 70–99)
HCT: 53 % — ABNORMAL HIGH (ref 39.0–52.0)
Hemoglobin: 18 g/dL — ABNORMAL HIGH (ref 13.0–17.0)
Potassium: 4.5 mmol/L (ref 3.5–5.1)
Sodium: 138 mmol/L (ref 135–145)
TCO2: 20 mmol/L — ABNORMAL LOW (ref 22–32)

## 2018-10-15 LAB — CBC
HCT: 50.6 % (ref 39.0–52.0)
Hemoglobin: 16.5 g/dL (ref 13.0–17.0)
MCH: 28.8 pg (ref 26.0–34.0)
MCHC: 32.6 g/dL (ref 30.0–36.0)
MCV: 88.3 fL (ref 80.0–100.0)
Platelets: 285 10*3/uL (ref 150–400)
RBC: 5.73 MIL/uL (ref 4.22–5.81)
RDW: 14.1 % (ref 11.5–15.5)
WBC: 14.2 10*3/uL — ABNORMAL HIGH (ref 4.0–10.5)
nRBC: 0 % (ref 0.0–0.2)

## 2018-10-15 LAB — APTT: aPTT: 25 seconds (ref 24–36)

## 2018-10-15 LAB — PROTIME-INR
INR: 1.1 (ref 0.8–1.2)
Prothrombin Time: 13.7 seconds (ref 11.4–15.2)

## 2018-10-15 MED ORDER — SODIUM CHLORIDE 0.9% FLUSH
3.0000 mL | Freq: Once | INTRAVENOUS | Status: AC
  Administered 2018-10-15: 3 mL via INTRAVENOUS

## 2018-10-15 MED ORDER — PREDNISONE 20 MG PO TABS: 20.0000 mg | ORAL_TABLET | Freq: Every day | ORAL | 0 refills | Status: AC

## 2018-10-15 MED ORDER — SODIUM CHLORIDE 0.9 % IV BOLUS
500.0000 mL | Freq: Once | INTRAVENOUS | Status: AC
  Administered 2018-10-15: 500 mL via INTRAVENOUS

## 2018-10-15 MED ORDER — PREDNISONE 20 MG PO TABS
40.0000 mg | ORAL_TABLET | Freq: Once | ORAL | Status: AC
  Administered 2018-10-15: 40 mg via ORAL
  Filled 2018-10-15: qty 2

## 2018-10-15 NOTE — Discharge Instructions (Addendum)
Today you were seen for nausea and vomiting. We did some studies including labs and a CT scan of your head which were reassuring. For your gout, we started you on prednisone which we want you to take daily with breakfast for the next four days. Please call you primary care provider within the next day to schedule an appointment. Your glucose was elevated and you might need an increase to your diabetes regimen.  Please call your doctor and return to the emergency room for worsening nausea, vomiting, changes in mental status or any other concerning symptom.  Thank you for allowing [place] to be part of your medical care

## 2018-10-15 NOTE — ED Triage Notes (Addendum)
Pt [name]. EMS reports the patient called 9-1-1 last night around [id number] for nausea and arm pain. EMS reports the patient refused transport last night for similar symptoms. EMS reports the family told them the patient had "verbal asphasia" and they called 9-1-1. EMS reports stroke screen was negative, 12 lead was unremarkable, vital stable, and nausea controlled with 4mg  of Zofran. EMS reports family advised them he is already weak on the right side from a stroke and they found same to be true.   Pt reports nausea, vomiting, and htn. Pt reports waking up at 0500 this am feeling fine and then @ 0600 he felt hot and sweaty with nausea. Pt denies nausea. Pt reports right sided weakness from his previous stroke 2 years ago.l

## 2018-10-15 NOTE — ED Provider Notes (Addendum)
[facility] EMERGENCY DEPARTMENT Provider Note   CSN: [id number] Arrival date & time: 10/15/18  0807    History   Chief Complaint Chief Complaint  Patient presents with  . Emesis  . Possible Stroke    HPI [name] is a 85 y.o. male with past medical history of CVA, type 2 diabetes mellitus, hypertension who presents after 1 episode of nausea, dry heaving, lethargy this morning.  Patient reports that he threw up white phlegm.  Patient says that he is otherwise doing okay.  Reports that he is in his normal state of health yesterday.  Last reported oral intake yesterday at dinner.  Patient denies headache, vision changes, weakness, chest pain, shortness of breath, abdominal pain, diarrhea.  Patient says that he lives at home with his daughter.  He also reports significant pain in his left wrist which started on Saturday.  Patient says he has had frequent gout attacks in the past which have been treated with prednisone.  Patient does not take any preventative gout therapy.  Spoke with daughter on phone. Daughter states that patient was vomiting and not responding to her while he was throwing up. She was concerned and called 911. She did not observe any facial droop, dysarthria, weakness. Daughter reports that at baseline patient has no mental deficits and can remember things without difficulty.      HPI  Past Medical History:  Diagnosis Date  . Adenomatous polyp   . CAD (coronary artery disease)   . Chronic kidney disease   . Diverticulosis   . DVT (deep venous thrombosis) ([place])   . Gout   . History of pulmonary embolus (PE)    from a DVT in 2008  . Hyperglycemia   . Hyperlipidemia   . Hypertension   . Mallory-Weiss tear   . Peripheral vascular disease ([place])   . Stroke ([place]) 09/2015  . SVT (supraventricular tachycardia) ([place])   . Vitamin B12 deficiency     Patient Active Problem List   Diagnosis Date Noted  . Gait disturbance, post-stroke 11/23/2015  .  Hemiparesis affecting right side as late effect of cerebrovascular accident (CVA) ([place]) 11/23/2015  . Acute blood loss anemia   . Acute lower UTI   . Right elbow pain   . Nausea without vomiting   . Urinary retention   . 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 ([place])   . Urinary hesitancy   . Right sided weakness   . Acute ischemic left MCA stroke ([place])   . Hematuria   . Bleeding   . Leukocytosis   . Depression   . Steroid-induced hyperglycemia   . Left pontine CVA ([place]) 10/21/2015  . Dysarthria, post-stroke   . Right hemiparesis ([place])   . Sinus arrest   . CKD (chronic kidney disease)   . DM type 2 without retinopathy ([place])   . Coronary artery disease involving native coronary artery of native heart without angina pectoris   . Acute idiopathic gout of right hand   . Aphasia   . Dysphagia, post-stroke   . HLD (hyperlipidemia)   . Benign essential HTN   . Orthostatic hypotension   . 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 CVA (cerebral infarction) 10/14/2015  . Accelerated hypertension 10/14/2015  . Type 2 diabetes mellitus with circulatory disorder ([place]) 10/14/2015  . Peripheral vascular disease, unspecified ([place]) 06/04/2013    Past Surgical History:  Procedure Laterality Date  .  PTCA of  coronary lesion Right 1995  . APPENDECTOMY    . EP IMPLANTABLE DEVICE N/A 12/01/2015   Procedure: Loop Recorder Insertion;  Surgeon: Evans Lance, MD;  Location: [facility];  Service: Cardiovascular;  Laterality: N/A;  . PR VEIN BYPASS GRAFT,AORTO-FEM-POP Left    femoral-popliteal BP by Dr. Drucie Opitz  . repair of aal fissure   1978        Home Medications    Prior to Admission medications   Medication Sig Start Date End Date Taking? Authorizing Provider  acetaminophen (TYLENOL) 500 MG tablet Take 500 mg by mouth every 6 (six) hours as needed for fever.    Yes [provider]  amLODipine  (NORVASC) 10 MG tablet Take 1 tablet (10 mg total) by mouth daily. 11/12/15  Yes Angiulli, Lavon Paganini, PA-C  aspirin 325 MG tablet Take 1 tablet (325 mg total) by mouth daily. 10/21/15  Yes Patrecia Pour, MD  clopidogrel (PLAVIX) 75 MG tablet Take 1 tablet (75 mg total) by mouth daily. 11/11/15  Yes Angiulli, Lavon Paganini, PA-C  diclofenac sodium (VOLTAREN) 1 % GEL Apply 2 g topically 4 (four) times daily. 11/27/15  Yes Kirsteins, Luanna Salk, MD  famotidine (PEPCID) 20 MG tablet Take 1 tablet (20 mg total) by mouth 2 (two) times daily. Patient taking differently: Take 20 mg by mouth 2 (two) times daily as needed.  11/11/15  Yes Angiulli, Lavon Paganini, PA-C  glimepiride (AMARYL) 2 MG tablet Take 1 tablet (2 mg total) by mouth daily with breakfast. 11/12/15  Yes Angiulli, Lavon Paganini, PA-C  lisinopril (PRINIVIL,ZESTRIL) 20 MG tablet Take 20 mg by mouth daily.   Yes [provider]  predniSONE (DELTASONE) 20 MG tablet Take 1 tablet (20 mg total) by mouth daily with breakfast for 4 doses. 10/15/18 10/19/18  Jeanmarie Hubert, MD    Family History Family History  Problem Relation Age of Onset  . Cancer Mother        bladder  . CAD Father   . Heart attack Father     Social History Social History   Tobacco Use  . Smoking status: Never Smoker  . Smokeless tobacco: Never Used  Substance Use Topics  . Alcohol use: No  . Drug use: No     Allergies   Hydralazine, Lipitor [atorvastatin], and Zetia [ezetimibe]   Review of Systems Review of Systems  Constitutional: Negative for chills and fatigue.  HENT: Negative for congestion.   Respiratory: Positive for cough. Negative for shortness of breath.   Cardiovascular: Negative for chest pain.  Gastrointestinal: Positive for nausea and vomiting. Negative for abdominal pain.  Genitourinary: Negative for dysuria, frequency and urgency.  All other systems reviewed and are negative.    Physical Exam Updated Vital Signs BP (!) 166/65   Pulse 76   Temp  97.7 F (36.5 C) (Oral)   Resp 15   Ht 5\' 7"  (1.702 m)   Wt 80.8 kg   SpO2 96%   BMI 27.90 kg/m   Physical Exam Constitutional:      Appearance: Normal appearance.  HENT:     Head: Normocephalic and atraumatic.     Right Ear: External ear normal.     Left Ear: External ear normal.  Neck:     Musculoskeletal: Neck supple.  Cardiovascular:     Rate and Rhythm: Normal rate and regular rhythm.     Heart sounds: Normal heart sounds. No murmur. No friction rub. No gallop.   Pulmonary:  Breath sounds: Normal breath sounds. No wheezing, rhonchi or rales.  Abdominal:     General: Abdomen is flat. There is no distension.     Palpations: Abdomen is soft.     Tenderness: There is no abdominal tenderness. There is no guarding.  Musculoskeletal:     Comments: Mild swelling and tenderness of right medial wrist  Skin:    General: Skin is warm and dry.  Neurological:     General: No focal deficit present.     Mental Status: He is alert.     Motor: No weakness.     Comments: Very mild right sided facial droop when smiling, no other cranial nerve deficit  Patient is AOx2, correctly stating year but stating month is August. Patient answers questions appropriately but when asked to repeat phrases or perform serial 7's patient states he is unable to do that  Psychiatric:        Mood and Affect: Mood normal.        Behavior: Behavior normal.      ED Treatments / Results  Labs (all labs ordered are listed, but only abnormal results are displayed) Labs Reviewed  CBC - Abnormal; Notable for the following components:      Result Value   WBC 14.2 (*)    All other components within normal limits  DIFFERENTIAL - Abnormal; Notable for the following components:   Neutro Abs 11.8 (*)    Abs Immature Granulocytes 0.14 (*)    All other components within normal limits  COMPREHENSIVE METABOLIC PANEL - Abnormal; Notable for the following components:   CO2 19 (*)    Glucose, Bld 279 (*)     Creatinine, Ser 1.99 (*)    GFR calc non Af Amer 29 (*)    GFR calc Af Amer 34 (*)    All other components within normal limits  I-STAT CHEM 8, ED - Abnormal; Notable for the following components:   BUN 24 (*)    Creatinine, Ser 1.70 (*)    Glucose, Bld 281 (*)    TCO2 20 (*)    Hemoglobin 18.0 (*)    HCT 53.0 (*)    All other components within normal limits  PROTIME-INR  APTT  URINALYSIS, ROUTINE W REFLEX MICROSCOPIC  CBG MONITORING, ED    EKG EKG Interpretation  Date/Time:  Monday October 15 2018 08:16:46 EDT Ventricular Rate:  72 PR Interval:    QRS Duration: 126 QT Interval:  416 QTC Calculation: 456 R Axis:   -48 Text Interpretation:  Sinus rhythm Left bundle branch block No significant change since last tracing Abnormal ECG Confirmed by Carmin Muskrat [phone]) on 10/15/2018 8:26:22 AM Also confirmed by Carmin Muskrat ([id number]), editor Philomena Doheny [phone])  on 10/15/2018 9:51:04 AM   Radiology Dg Chest 2 View  Result Date: 10/15/2018 CLINICAL DATA:  Mental status change, ruling out infection. EXAM: CHEST - 2 VIEW COMPARISON:  Chest radiograph 09/03/2016 FINDINGS: Heart size within normal limits. Aortic atherosclerosis. Shallow inspiration radiograph with minimal bibasilar atelectasis. No focal consolidation within the lungs. No evidence of pleural effusion or pneumothorax. No acute bony abnormality. Thoracic dextrocurvature. Degenerative change of the spine. IMPRESSION: Shallow inspiration radiograph with minimal bibasilar atelectasis. No focal consolidation within the lungs. Electronically Signed   By: Kellie Simmering   On: 10/15/2018 09:43   Ct Head Wo Contrast  Result Date: 10/15/2018 CLINICAL DATA:  Aphasia last night, nausea and vomiting this morning, history stroke, coronary artery disease, hypertension, hyperlipidemia EXAM: CT HEAD WITHOUT  CONTRAST TECHNIQUE: Contiguous axial images were obtained from the base of the skull through the vertex without intravenous contrast.  Sagittal and coronal MPR images reconstructed from axial data set. COMPARISON:  10/25/2015 FINDINGS: Brain: Generalized atrophy. Ex vacuo dilatation of the ventricular system similar to prior exam. No midline shift or mass effect. Small vessel chronic ischemic changes of deep cerebral white matter. Old LEFT pontine infarct. No intracranial hemorrhage, mass lesion or evidence of acute infarction. Vascular: Atherosclerotic calcifications of internal carotid arteries at skull base. No definite hyperdense vessels Skull: Intact Sinuses/Orbits: Clear Other: N/A IMPRESSION: Atrophy with small vessel chronic ischemic changes of deep cerebral white matter. Old LEFT pontine infarct. No acute intracranial abnormalities. Electronically Signed   By: Lavonia Dana M.D.   On: 10/15/2018 10:03    Procedures Procedures (including critical care time)  Medications Ordered in ED Medications  sodium chloride flush (NS) 0.9 % injection 3 mL (3 mLs Intravenous Given 10/15/18 0843)  sodium chloride 0.9 % bolus 500 mL (0 mLs Intravenous Stopped 10/15/18 1420)  predniSONE (DELTASONE) tablet 40 mg (40 mg Oral Given 10/15/18 1120)     Initial Impression / Assessment and Plan / ED Course  I have reviewed the triage vital signs and the nursing notes.  Pertinent labs & imaging results that were available during my care of the patient were reviewed by me and considered in my medical decision making (see chart for details).        Patient with benign exam and no new deficits per exam. Per chart review, patient had mild residual right sided facial droop.  Patient with isolated episode of emesis which has resolved. Infectious workup with CXR without infiltrate and patient without dysuria. CT head without acute abnormality. Daughter at bedside and patient at baseline per daughter. Patient is on aspirin and plavix as outpatient.  Patient with elevated glucose to 281 without gap. Patient advised to followup with PCP for possible  medication adjustment. Creatinine elevated to 1.99 from baseline of 1.68 in the setting of decreased oral intake. Patient given 500 mL NS bolus. Patient with gout-like pain in left wrist. Patient started on 4 day course of prednisone.    Final Clinical Impressions(s) / ED Diagnoses   Final diagnoses:  None    ED Discharge Orders         Ordered    predniSONE (DELTASONE) 20 MG tablet  Daily with breakfast     10/15/18 1412           Jeanmarie Hubert, MD 10/15/18 1448    Jeanmarie Hubert, MD 10/15/18 1627    Carmin Muskrat, MD 10/18/18 [phone]

## 2018-10-15 NOTE — ED Notes (Signed)
Patient verbalizes understanding of discharge instructions. Opportunity for questioning and answers were provided. Armband removed by staff, pt discharged from ED via wheelchair to home.  

## 2018-10-16 NOTE — Progress Notes (Signed)
Carelink Summary Report / Loop Recorder 

## 2018-11-12 ENCOUNTER — Ambulatory Visit (INDEPENDENT_AMBULATORY_CARE_PROVIDER_SITE_OTHER): Payer: Medicare Other | Admitting: *Deleted

## 2018-11-12 DIAGNOSIS — I639 Cerebral infarction, unspecified: Secondary | ICD-10-CM

## 2018-11-12 LAB — CUP PACEART REMOTE DEVICE CHECK
Date Time Interrogation Session: 20200912201226
Implantable Pulse Generator Implant Date: 20171003

## 2018-11-23 NOTE — Progress Notes (Signed)
Carelink Summary Report / Loop Recorder 

## 2018-12-13 ENCOUNTER — Ambulatory Visit (INDEPENDENT_AMBULATORY_CARE_PROVIDER_SITE_OTHER): Payer: Medicare Other | Admitting: *Deleted

## 2018-12-13 DIAGNOSIS — I6302 Cerebral infarction due to thrombosis of basilar artery: Secondary | ICD-10-CM

## 2018-12-15 LAB — CUP PACEART REMOTE DEVICE CHECK
Date Time Interrogation Session: 20201015200847
Implantable Pulse Generator Implant Date: 20171003

## 2018-12-28 NOTE — Progress Notes (Signed)
Carelink Summary Report / Loop Recorder 

## 2019-01-08 ENCOUNTER — Telehealth: Payer: Self-pay | Admitting: Emergency Medicine

## 2019-01-08 NOTE — Telephone Encounter (Signed)
Attempted to send re,ote transmission . Receiver needs to charge. Daughter reports patient has had no change in condition. Alert was for tachy event but transmission was lost and need remote to view event. Will call back after receiver is charged and send remote .

## 2019-01-10 NOTE — Telephone Encounter (Signed)
The pt daughter tried sending a transmission again but received the error code [id number] again. I gave her the number to [facility] support to get additional help.

## 2019-01-15 ENCOUNTER — Ambulatory Visit (INDEPENDENT_AMBULATORY_CARE_PROVIDER_SITE_OTHER): Payer: Medicare Other | Admitting: *Deleted

## 2019-01-15 DIAGNOSIS — I63512 Cerebral infarction due to unspecified occlusion or stenosis of left middle cerebral artery: Secondary | ICD-10-CM | POA: Diagnosis not present

## 2019-01-16 LAB — CUP PACEART REMOTE DEVICE CHECK
Date Time Interrogation Session: 20201117200836
Implantable Pulse Generator Implant Date: 20171003

## 2019-01-16 NOTE — Telephone Encounter (Signed)
Pt daughter called and we did received the transmission 01-16-2019. I told her a nurse will review it and give her a call back.

## 2019-01-16 NOTE — Telephone Encounter (Signed)
Tachy episode from 12/04/18 at 20:16 reviewed. ECG shows 14sec duration tachy episode, indeterminate at first due to artifact, somewhat irregular at termination. LINQ implanted for cryptogenic stroke. As patient reportedly asymptomatic, routed to Dr. Lovena Le for review.

## 2019-01-16 NOTE — Telephone Encounter (Signed)
No answer

## 2019-01-23 NOTE — Telephone Encounter (Signed)
Looks like AT then noise then very brief atrial fib. Watchful waiting. GT

## 2019-02-12 NOTE — Progress Notes (Signed)
Carelink Summary Report / Loop Recorder 

## 2019-02-18 ENCOUNTER — Ambulatory Visit (INDEPENDENT_AMBULATORY_CARE_PROVIDER_SITE_OTHER): Payer: Medicare Other | Admitting: *Deleted

## 2019-02-18 DIAGNOSIS — I63512 Cerebral infarction due to unspecified occlusion or stenosis of left middle cerebral artery: Secondary | ICD-10-CM | POA: Diagnosis not present

## 2019-02-18 LAB — CUP PACEART REMOTE DEVICE CHECK
Date Time Interrogation Session: 20201220151439
Implantable Pulse Generator Implant Date: 20171003

## 2019-03-25 ENCOUNTER — Ambulatory Visit (INDEPENDENT_AMBULATORY_CARE_PROVIDER_SITE_OTHER): Payer: Medicare Other | Admitting: *Deleted

## 2019-03-25 DIAGNOSIS — I63512 Cerebral infarction due to unspecified occlusion or stenosis of left middle cerebral artery: Secondary | ICD-10-CM | POA: Diagnosis not present

## 2019-03-25 LAB — CUP PACEART REMOTE DEVICE CHECK
Date Time Interrogation Session: 20210124231540
Implantable Pulse Generator Implant Date: 20171003

## 2019-04-29 ENCOUNTER — Ambulatory Visit (INDEPENDENT_AMBULATORY_CARE_PROVIDER_SITE_OTHER): Payer: Medicare Other | Admitting: *Deleted

## 2019-04-29 DIAGNOSIS — I63512 Cerebral infarction due to unspecified occlusion or stenosis of left middle cerebral artery: Secondary | ICD-10-CM

## 2019-04-29 LAB — CUP PACEART REMOTE DEVICE CHECK
Date Time Interrogation Session: 20210228230848
Implantable Pulse Generator Implant Date: 20171003

## 2019-04-30 NOTE — Progress Notes (Signed)
ILR Remote 

## 2019-06-03 ENCOUNTER — Ambulatory Visit (INDEPENDENT_AMBULATORY_CARE_PROVIDER_SITE_OTHER): Payer: Medicare Other | Admitting: *Deleted

## 2019-06-03 DIAGNOSIS — I63512 Cerebral infarction due to unspecified occlusion or stenosis of left middle cerebral artery: Secondary | ICD-10-CM

## 2019-06-04 ENCOUNTER — Telehealth: Payer: Self-pay | Admitting: Internal Medicine

## 2019-06-04 LAB — CUP PACEART REMOTE DEVICE CHECK
Date Time Interrogation Session: 20210401001912
Implantable Pulse Generator Implant Date: 20171003

## 2019-06-04 NOTE — Telephone Encounter (Signed)
Patient's daughter called wanting to know if her father doesn't need to having his loop recorder monitored anymore.  It has been over 3 years since it has been implanted.

## 2019-06-04 NOTE — Telephone Encounter (Signed)
Spoke with pt daughter, DPR on file.  Advised the battery on ILR has not reached replacement time.  We typically recommend keeping ILR in and monitoring until the battery dies.  Daughter states that pt would like to stop the monitoring, pt was under impression that monitoring would only last for 3 years and then could be discontinued at pt request.  Advised that once monitoring is discontinued, regardless of status of implant we will not get data from implant.  Pt daughter states that pt still wishes to stop the remote monitoring of ILR.  If ok with Dr. Lovena Le, return kit needs to be sent to pt at his daughters home which is: [address], [place], [place] [phone]

## 2019-06-06 NOTE — Telephone Encounter (Signed)
Per Dr. Lovena Le- OK to discontinue monitoring

## 2019-06-06 NOTE — Telephone Encounter (Signed)
Unenrolled from [place], appointments cancelled, return kit ordered  [employer], NP 06/06/2019 10:49 AM

## 2020-05-10 ENCOUNTER — Encounter (HOSPITAL_COMMUNITY): Payer: Self-pay | Admitting: Emergency Medicine

## 2020-05-10 ENCOUNTER — Emergency Department (HOSPITAL_COMMUNITY): Payer: Medicare Other

## 2020-05-10 ENCOUNTER — Observation Stay (HOSPITAL_COMMUNITY)
Admission: EM | Admit: 2020-05-10 | Discharge: 2020-05-11 | Disposition: A | Discharge status: Home / Self Care | Payer: Medicare Other | Attending: Internal Medicine | Admitting: Internal Medicine

## 2020-05-10 ENCOUNTER — Other Ambulatory Visit: Payer: Self-pay

## 2020-05-10 DIAGNOSIS — Z20822 Contact with and (suspected) exposure to covid-19: Secondary | ICD-10-CM | POA: Insufficient documentation

## 2020-05-10 DIAGNOSIS — R0902 Hypoxemia: Secondary | ICD-10-CM | POA: Diagnosis not present

## 2020-05-10 DIAGNOSIS — R404 Transient alteration of awareness: Secondary | ICD-10-CM | POA: Diagnosis not present

## 2020-05-10 DIAGNOSIS — Z7984 Long term (current) use of oral hypoglycemic drugs: Secondary | ICD-10-CM | POA: Diagnosis not present

## 2020-05-10 DIAGNOSIS — N189 Chronic kidney disease, unspecified: Secondary | ICD-10-CM | POA: Diagnosis present

## 2020-05-10 DIAGNOSIS — I10 Essential (primary) hypertension: Secondary | ICD-10-CM | POA: Diagnosis present

## 2020-05-10 DIAGNOSIS — E119 Type 2 diabetes mellitus without complications: Secondary | ICD-10-CM

## 2020-05-10 DIAGNOSIS — R110 Nausea: Secondary | ICD-10-CM

## 2020-05-10 DIAGNOSIS — R112 Nausea with vomiting, unspecified: Secondary | ICD-10-CM | POA: Diagnosis not present

## 2020-05-10 DIAGNOSIS — Z743 Need for continuous supervision: Secondary | ICD-10-CM | POA: Diagnosis not present

## 2020-05-10 DIAGNOSIS — I6389 Other cerebral infarction: Secondary | ICD-10-CM | POA: Diagnosis not present

## 2020-05-10 DIAGNOSIS — I129 Hypertensive chronic kidney disease with stage 1 through stage 4 chronic kidney disease, or unspecified chronic kidney disease: Secondary | ICD-10-CM | POA: Insufficient documentation

## 2020-05-10 DIAGNOSIS — N1831 Chronic kidney disease, stage 3a: Secondary | ICD-10-CM | POA: Diagnosis not present

## 2020-05-10 DIAGNOSIS — Z7982 Long term (current) use of aspirin: Secondary | ICD-10-CM | POA: Insufficient documentation

## 2020-05-10 DIAGNOSIS — I2510 Atherosclerotic heart disease of native coronary artery without angina pectoris: Secondary | ICD-10-CM | POA: Insufficient documentation

## 2020-05-10 DIAGNOSIS — G319 Degenerative disease of nervous system, unspecified: Secondary | ICD-10-CM | POA: Diagnosis not present

## 2020-05-10 DIAGNOSIS — R6889 Other general symptoms and signs: Secondary | ICD-10-CM | POA: Diagnosis not present

## 2020-05-10 DIAGNOSIS — E1122 Type 2 diabetes mellitus with diabetic chronic kidney disease: Secondary | ICD-10-CM | POA: Insufficient documentation

## 2020-05-10 DIAGNOSIS — I672 Cerebral atherosclerosis: Secondary | ICD-10-CM | POA: Diagnosis not present

## 2020-05-10 DIAGNOSIS — I739 Peripheral vascular disease, unspecified: Secondary | ICD-10-CM | POA: Diagnosis not present

## 2020-05-10 DIAGNOSIS — Z79899 Other long term (current) drug therapy: Secondary | ICD-10-CM | POA: Diagnosis not present

## 2020-05-10 DIAGNOSIS — I69351 Hemiplegia and hemiparesis following cerebral infarction affecting right dominant side: Secondary | ICD-10-CM | POA: Insufficient documentation

## 2020-05-10 DIAGNOSIS — R55 Syncope and collapse: Principal | ICD-10-CM | POA: Insufficient documentation

## 2020-05-10 DIAGNOSIS — N183 Chronic kidney disease, stage 3 unspecified: Secondary | ICD-10-CM | POA: Diagnosis present

## 2020-05-10 LAB — URINALYSIS, ROUTINE W REFLEX MICROSCOPIC
Bilirubin Urine: NEGATIVE
Glucose, UA: NEGATIVE mg/dL
Hgb urine dipstick: NEGATIVE
Ketones, ur: 5 mg/dL — AB
Leukocytes,Ua: NEGATIVE
Nitrite: NEGATIVE
Protein, ur: NEGATIVE mg/dL
Specific Gravity, Urine: 1.012 (ref 1.005–1.030)
pH: 5 (ref 5.0–8.0)

## 2020-05-10 LAB — CBC
HCT: 48.1 % (ref 39.0–52.0)
Hemoglobin: 15.6 g/dL (ref 13.0–17.0)
MCH: 30.4 pg (ref 26.0–34.0)
MCHC: 32.4 g/dL (ref 30.0–36.0)
MCV: 93.6 fL (ref 80.0–100.0)
Platelets: 178 10*3/uL (ref 150–400)
RBC: 5.14 MIL/uL (ref 4.22–5.81)
RDW: 13.9 % (ref 11.5–15.5)
WBC: 12.1 10*3/uL — ABNORMAL HIGH (ref 4.0–10.5)
nRBC: 0 % (ref 0.0–0.2)

## 2020-05-10 LAB — HEPATIC FUNCTION PANEL
ALT: 18 U/L (ref 0–44)
AST: 17 U/L (ref 15–41)
Albumin: 3.4 g/dL — ABNORMAL LOW (ref 3.5–5.0)
Alkaline Phosphatase: 40 U/L (ref 38–126)
Bilirubin, Direct: 0.2 mg/dL (ref 0.0–0.2)
Indirect Bilirubin: 0.7 mg/dL (ref 0.3–0.9)
Total Bilirubin: 0.9 mg/dL (ref 0.3–1.2)
Total Protein: 6.5 g/dL (ref 6.5–8.1)

## 2020-05-10 LAB — BASIC METABOLIC PANEL
Anion gap: 7 (ref 5–15)
BUN: 19 mg/dL (ref 8–23)
CO2: 22 mmol/L (ref 22–32)
Calcium: 8.7 mg/dL — ABNORMAL LOW (ref 8.9–10.3)
Chloride: 108 mmol/L (ref 98–111)
Creatinine, Ser: 1.53 mg/dL — ABNORMAL HIGH (ref 0.61–1.24)
GFR, Estimated: 43 mL/min — ABNORMAL LOW (ref >60)
Glucose, Bld: 169 mg/dL — ABNORMAL HIGH (ref 70–99)
Potassium: 4.3 mmol/L (ref 3.5–5.1)
Sodium: 137 mmol/L (ref 135–145)

## 2020-05-10 LAB — TROPONIN I (HIGH SENSITIVITY)
Troponin I (High Sensitivity): 9 ng/L (ref <18)
Troponin I (High Sensitivity): 11 ng/L (ref <18)

## 2020-05-10 LAB — LIPASE, BLOOD: Lipase: 37 U/L (ref 11–51)

## 2020-05-10 LAB — RESP PANEL BY RT-PCR (FLU A&B, COVID) ARPGX2
Influenza A by PCR: NEGATIVE
Influenza B by PCR: NEGATIVE
SARS Coronavirus 2 by RT PCR: NEGATIVE

## 2020-05-10 LAB — GLUCOSE, CAPILLARY
Glucose-Capillary: 212 mg/dL — ABNORMAL HIGH (ref 70–99)
Glucose-Capillary: 118 mg/dL — ABNORMAL HIGH (ref 70–99)

## 2020-05-10 MED ORDER — PANTOPRAZOLE SODIUM 40 MG PO TBEC
40.0000 mg | DELAYED_RELEASE_TABLET | Freq: Every day | ORAL | Status: DC
  Administered 2020-05-10 – 2020-05-11 (×2): 40 mg via ORAL
  Filled 2020-05-10 (×2): qty 1

## 2020-05-10 MED ORDER — SODIUM CHLORIDE 0.9 % IV BOLUS
500.0000 mL | Freq: Once | INTRAVENOUS | Status: AC
  Administered 2020-05-10: 500 mL via INTRAVENOUS

## 2020-05-10 MED ORDER — ENOXAPARIN SODIUM 40 MG/0.4ML ~~LOC~~ SOLN: 40.0000 mg | SUBCUTANEOUS | Status: DC

## 2020-05-10 MED ORDER — CLOPIDOGREL BISULFATE 75 MG PO TABS
75.0000 mg | ORAL_TABLET | Freq: Every day | ORAL | Status: DC
  Administered 2020-05-10 – 2020-05-11 (×2): 75 mg via ORAL
  Filled 2020-05-10 (×2): qty 1

## 2020-05-10 MED ORDER — ROSUVASTATIN CALCIUM 5 MG PO TABS
5.0000 mg | ORAL_TABLET | ORAL | Status: DC
  Administered 2020-05-11: 5 mg via ORAL
  Filled 2020-05-10: qty 1

## 2020-05-10 MED ORDER — SODIUM CHLORIDE 0.9% FLUSH
3.0000 mL | Freq: Two times a day (BID) | INTRAVENOUS | Status: DC
  Administered 2020-05-10 – 2020-05-11 (×2): 3 mL via INTRAVENOUS

## 2020-05-10 MED ORDER — INSULIN ASPART 100 UNIT/ML ~~LOC~~ SOLN
0.0000 [IU] | Freq: Three times a day (TID) | SUBCUTANEOUS | Status: DC
  Administered 2020-05-11: 1 [IU] via SUBCUTANEOUS

## 2020-05-10 MED ORDER — ASPIRIN 325 MG PO TABS
325.0000 mg | ORAL_TABLET | Freq: Every day | ORAL | Status: DC
  Administered 2020-05-10 – 2020-05-11 (×2): 325 mg via ORAL
  Filled 2020-05-10 (×2): qty 1

## 2020-05-10 MED ORDER — AMLODIPINE BESYLATE 10 MG PO TABS
10.0000 mg | ORAL_TABLET | Freq: Every day | ORAL | Status: DC
  Administered 2020-05-10 – 2020-05-11 (×2): 10 mg via ORAL
  Filled 2020-05-10: qty 2
  Filled 2020-05-10: qty 1

## 2020-05-10 NOTE — ED Notes (Signed)
ED TO INPATIENT HANDOFF REPORT  Name/Age/Gender [name] 87 y.o. male  Code Status    Code Status Orders  (From admission, onward)         Start     Ordered   05/10/20 1455  Do not attempt resuscitation (DNR)  Continuous       Question Answer Comment  In the event of cardiac or respiratory ARREST Do not call a "code blue"   In the event of cardiac or respiratory ARREST Do not perform Intubation, CPR, defibrillation or ACLS   In the event of cardiac or respiratory ARREST Use medication by any route, position, wound care, and other measures to relive pain and suffering. May use oxygen, suction and manual treatment of airway obstruction as needed for comfort.      05/10/20 1454        Code Status History    Date Active Date Inactive Code Status Order ID Comments User Context   10/21/2015 1721 11/11/2015 1641 Full Code [id number]  Elizabeth Sauer Inpatient   10/21/2015 1721 10/21/2015 1721 Full Code [id number]  Elizabeth Sauer Inpatient   10/14/2015 1802 10/21/2015 1715 Full Code [id number]  Lavina [place], MD ED   Advance Care Planning Activity      Home/SNF/Other Home  Chief Complaint Syncope [R55]  Level of Care/Admitting Diagnosis ED Disposition    ED Disposition Condition [place]: [facility] [458099]  Level of Care: Telemetry [5]  Admit to tele based on following criteria: Eval of Syncope  Covid Evaluation: Asymptomatic Screening Protocol (No Symptoms)  Diagnosis: Syncope [206001]  Admitting Physician: Harold Hedge [[id number]]  Attending Physician: Harold Hedge [[id number]]       Medical History Past Medical History:  Diagnosis Date  . Adenomatous polyp   . CAD (coronary artery disease)   . Chronic kidney disease   . Diverticulosis   . DVT (deep venous thrombosis) ([place])   . Gout   . History of pulmonary embolus (PE)    from a DVT in 2008  . Hyperglycemia   . Hyperlipidemia   . Hypertension   .  Mallory-Weiss tear   . Peripheral vascular disease ([place])   . Stroke ([place]) 09/2015  . SVT (supraventricular tachycardia) ([place])   . Vitamin B12 deficiency     Allergies Allergies  Allergen Reactions  . Cephalexin     Other reaction(s): muscle/knee pain  . Glimepiride     Other reaction(s): headache  . Hydralazine Nausea Only  . Lipitor [Atorvastatin]     Muscle pain   . Zetia [Ezetimibe] Hives    IV Location/Drains/Wounds Patient Lines/Drains/Airways Status    Active Line/Drains/Airways    Name Placement date Placement time Site Days   Peripheral IV Left;Posterior Hand --  --  Hand  --          Labs/Imaging Results for orders placed or performed during the hospital encounter of 05/10/20 (from the past 48 hour(s))  Basic metabolic panel     Status: Abnormal   Collection Time: 05/10/20 11:38 AM  Result Value Ref Range   Sodium 137 135 - 145 mmol/L   Potassium 4.3 3.5 - 5.1 mmol/L   Chloride 108 98 - 111 mmol/L   CO2 22 22 - 32 mmol/L   Glucose, Bld 169 (H) 70 - 99 mg/dL    Comment: Glucose reference range applies only to samples taken after fasting for at least 8 hours.   BUN  19 8 - 23 mg/dL   Creatinine, Ser 1.53 (H) 0.61 - 1.24 mg/dL   Calcium 8.7 (L) 8.9 - 10.3 mg/dL   GFR, Estimated 43 (L) >60 mL/min    Comment: (NOTE) Calculated using the CKD-EPI Creatinine Equation (2021)    Anion gap 7 5 - 15    Comment: Performed at [facility], [place] [address], [place], [place] [address]  CBC     Status: Abnormal   Collection Time: 05/10/20 11:38 AM  Result Value Ref Range   WBC 12.1 (H) 4.0 - 10.5 K/uL   RBC 5.14 4.22 - 5.81 MIL/uL   Hemoglobin 15.6 13.0 - 17.0 g/dL   HCT 48.1 39.0 - 52.0 %   MCV 93.6 80.0 - 100.0 fL   MCH 30.4 26.0 - 34.0 pg   MCHC 32.4 30.0 - 36.0 g/dL   RDW 13.9 11.5 - 15.5 %   Platelets 178 150 - 400 K/uL   nRBC 0.0 0.0 - 0.2 %    Comment: Performed at [facility], [place] [address] [place], [place]  [address]  Hepatic function panel     Status: Abnormal   Collection Time: 05/10/20 11:50 AM  Result Value Ref Range   Total Protein 6.5 6.5 - 8.1 g/dL   Albumin 3.4 (L) 3.5 - 5.0 g/dL   AST 17 15 - 41 U/L   ALT 18 0 - 44 U/L   Alkaline Phosphatase 40 38 - 126 U/L   Total Bilirubin 0.9 0.3 - 1.2 mg/dL   Bilirubin, Direct 0.2 0.0 - 0.2 mg/dL   Indirect Bilirubin 0.7 0.3 - 0.9 mg/dL    Comment: Performed at [facility], [place] [address] [place], [place] [address]  Lipase, blood     Status: None   Collection Time: 05/10/20 11:50 AM  Result Value Ref Range   Lipase 37 11 - 51 U/L    Comment: Performed at [facility], [place] [address], [place], [place] [address]  Troponin I (High Sensitivity)     Status: None   Collection Time: 05/10/20 11:50 AM  Result Value Ref Range   Troponin I (High Sensitivity) 9 <18 ng/L    Comment: (NOTE) Elevated high sensitivity troponin I (hsTnI) values and significant  changes across serial measurements may suggest ACS but many other  chronic and acute conditions are known to elevate hsTnI results.  Refer to the "Links" section for chest pain algorithms and additional  guidance. Performed at [facility], [place] [address], [place], [place] [address]   Resp Panel by RT-PCR (Flu A&B, Covid) Nasopharyngeal Swab     Status: None   Collection Time: 05/10/20 11:50 AM   Specimen: Nasopharyngeal Swab; Nasopharyngeal(NP) swabs in vial transport medium  Result Value Ref Range   SARS Coronavirus 2 by RT PCR NEGATIVE NEGATIVE    Comment: (NOTE) SARS-CoV-2 target nucleic acids are NOT DETECTED.  The SARS-CoV-2 RNA is generally detectable in upper respiratory specimens during the acute phase of infection. The lowest concentration of SARS-CoV-2 viral copies this assay can detect is 138 copies/mL. A negative result does not preclude SARS-Cov-2 infection and should not be used as the sole basis for treatment or other  patient management decisions. A negative result may occur with  improper specimen collection/handling, submission of specimen other than nasopharyngeal swab, presence of viral mutation(s) within the areas targeted by this assay, and inadequate number of viral copies(<138 copies/mL). A negative result must be combined with clinical  observations, patient history, and epidemiological information. The expected result is Negative.  Fact Sheet for Patients:  EntrepreneurPulse.com.au  Fact Sheet for Healthcare Providers:  IncredibleEmployment.be  This test is no t yet approved or cleared by the [place] FDA and  has been authorized for detection and/or diagnosis of SARS-CoV-2 by FDA under an Emergency Use Authorization (EUA). This EUA will remain  in effect (meaning this test can be used) for the duration of the COVID-19 declaration under Section 564(b)(1) of the Act, 21 U.S.C.section 360bbb-3(b)(1), unless the authorization is terminated  or revoked sooner.       Influenza A by PCR NEGATIVE NEGATIVE   Influenza B by PCR NEGATIVE NEGATIVE    Comment: (NOTE) The Xpert Xpress SARS-CoV-2/FLU/RSV plus assay is intended as an aid in the diagnosis of influenza from Nasopharyngeal swab specimens and should not be used as a sole basis for treatment. Nasal washings and aspirates are unacceptable for Xpert Xpress SARS-CoV-2/FLU/RSV testing.  Fact Sheet for Patients: EntrepreneurPulse.com.au  Fact Sheet for Healthcare Providers: IncredibleEmployment.be  This test is not yet approved or cleared by the [place] FDA and has been authorized for detection and/or diagnosis of SARS-CoV-2 by FDA under an Emergency Use Authorization (EUA). This EUA will remain in effect (meaning this test can be used) for the duration of the COVID-19 declaration under Section 564(b)(1) of the Act, 21 U.S.C. section 360bbb-3(b)(1), unless  the authorization is terminated or revoked.  Performed at [facility], [place] [address], [place], [place] [address]   Troponin I (High Sensitivity)     Status: None   Collection Time: 05/10/20  1:50 PM  Result Value Ref Range   Troponin I (High Sensitivity) 11 <18 ng/L    Comment: (NOTE) Elevated high sensitivity troponin I (hsTnI) values and significant  changes across serial measurements may suggest ACS but many other  chronic and acute conditions are known to elevate hsTnI results.  Refer to the "Links" section for chest pain algorithms and additional  guidance. Performed at [facility], [place] [address] [place], [place] [address]   Urinalysis, Routine w reflex microscopic Urine, Clean Catch     Status: Abnormal   Collection Time: 05/10/20  2:00 PM  Result Value Ref Range   Color, Urine YELLOW YELLOW   APPearance CLEAR CLEAR   Specific Gravity, Urine 1.012 1.005 - 1.030   pH 5.0 5.0 - 8.0   Glucose, UA NEGATIVE NEGATIVE mg/dL   Hgb urine dipstick NEGATIVE NEGATIVE   Bilirubin Urine NEGATIVE NEGATIVE   Ketones, ur 5 (A) NEGATIVE mg/dL   Protein, ur NEGATIVE NEGATIVE mg/dL   Nitrite NEGATIVE NEGATIVE   Leukocytes,Ua NEGATIVE NEGATIVE    Comment: Performed at [place] [address] [place], [place] [address]   CT Head Wo Contrast  Result Date: 05/10/2020 CLINICAL DATA:  TIA symptoms EXAM: CT HEAD WITHOUT CONTRAST TECHNIQUE: Contiguous axial images were obtained from the base of the skull through the vertex without intravenous contrast. COMPARISON:  October 15, 2018 FINDINGS: Brain: Moderate diffuse atrophy is stable. There is no intracranial mass, hemorrhage, extra-axial fluid collection, or midline shift. There is decreased attenuation in the centra semiovale bilaterally, stable. There is a focal area of decreased attenuation in the left lower pons, consistent with a prior small infarct in this area. No acute  appearing infarct evident. Vascular: No hyperdense vessel. Foci of calcification noted in each distal vertebral artery and carotid siphon region. Skull: The bony calvarium appears intact. Sinuses/Orbits:  Visualized paranasal sinuses are clear. Visualized orbits appear symmetric bilaterally. Other: Mastoid air cells are clear. IMPRESSION: Stable atrophy with periventricular small vessel disease. Prior small infarct inferior left pons. No acute infarct evident. No mass or hemorrhage. There are multiple foci of arterial vascular calcification. Electronically Signed   By: Lowella Grip III M.D.   On: 05/10/2020 14:35   DG Chest Portable 1 View  Result Date: 05/10/2020 CLINICAL DATA:  Syncope EXAM: PORTABLE CHEST 1 VIEW COMPARISON:  10/15/2018 FINDINGS: The heart size and mediastinal contours are within normal limits. Both lungs are clear. The visualized skeletal structures are unremarkable. IMPRESSION: No acute abnormality of the lungs. Electronically Signed   By: Eddie Candle M.D.   On: 05/10/2020 12:34    Pending Labs Unresulted Labs (From admission, onward)          Start     Ordered   05/11/20 1829  Basic metabolic panel  Tomorrow morning,   R        05/10/20 1454   05/11/20 0500  CBC  Tomorrow morning,   R        05/10/20 1454          Vitals/Pain Today's Vitals   05/10/20 1315 05/10/20 1345 05/10/20 1400 05/10/20 1529  BP: 138/68 (!) 160/81 (!) 156/79 (!) 124/99  Pulse: 74 82 84 92  Resp: 14 16 16 16   Temp:    99.4 F (37.4 C)  TempSrc:    Oral  SpO2: 93% 94% 96% 95%  PainSc:    0-No pain    Isolation Precautions No active isolations  Medications Medications  aspirin tablet 325 mg (325 mg Oral Given 05/10/20 1531)  amLODipine (NORVASC) tablet 10 mg (10 mg Oral Given 05/10/20 1531)  rosuvastatin (CRESTOR) tablet 5 mg (has no administration in time range)  clopidogrel (PLAVIX) tablet 75 mg (75 mg Oral Given 05/10/20 1531)  sodium chloride flush (NS) 0.9 % injection 3 mL  (has no administration in time range)  enoxaparin (LOVENOX) injection 40 mg (has no administration in time range)  pantoprazole (PROTONIX) EC tablet 40 mg (40 mg Oral Given 05/10/20 1532)  sodium chloride 0.9 % bolus 500 mL (500 mLs Intravenous Bolus 05/10/20 1201)    Mobility walks with device Uses cane to ambulate

## 2020-05-10 NOTE — H&P (Addendum)
History and Physical        Hospital Admission Note Date: 05/10/2020  Patient name: [name] Medical record number: [id number] Date of birth: 01-07-1928 Age: 88 y.o. Gender: male  PCP: Lajean Manes, MD    Chief Complaint    Chief Complaint  Patient presents with  . Loss of Consciousness      HPI:   Patient is a poor historian and history is obtained from daughter at bedside and prior notes  This is a 87 year old male with past medical history of CAD, CKD, hypertension, hyperlipidemia, PVD, Mallory-Weiss tear, left temporal and left pontine stroke in 2017 with residual right-sided weakness with a loop recorder in place, SVT, type 2 diabetes who was brought in by ambulance a witnessed syncopal versus presyncopal episode this morning while eating breakfast with his son. Has been in his normal state of health until this morning when he was at his table eating breakfast and became diaphoretic and nauseated, felt lightheaded, became pale and questionably unresponsive per ED note vs. difficulty with speech and staring spell per daughter (she did not witness the event, unclear history)?  Patient states he was not fatigued afterwards and denies history of seizures.  Son's number is not in the chart to confirm.vomited with EMS and was given Zofran and fluids.  Denies any fevers, fall or any other pains currently he feels back to normal.  Of note, daughter states that he has had a chronic dry cough which seems worse with lying down and when he is eating.   ED Course: Afebrile, hemodynamically stable, on room air. Notable Labs: Sodium 137, K4.3, glucose 169, BUN 19, creatinine 1.5, troponin 9, WBC 12.1, Hb 15.6, COVID-19 and flu negative. Notable Imaging: CXR unremarkable, CT head-stable atrophy with periventricular small vessel disease, small prior small infarct in inferior left pons, multiple  foci of arterial vascular calcification, no acute infarct. Patient received 500 cc NS bolus.    Vitals:   05/10/20 1529 05/10/20 1617  BP: (!) 124/99 (!) 155/85  Pulse: 92 (!) 102  Resp: 16 16  Temp: 99.4 F (37.4 C) 98.6 F (37 C)  SpO2: 95% 96%     Review of Systems:  Review of Systems  All other systems reviewed and are negative.   Medical/Social/Family History   Past Medical History: Past Medical History:  Diagnosis Date  . Adenomatous polyp   . CAD (coronary artery disease)   . Chronic kidney disease   . Diverticulosis   . DVT (deep venous thrombosis) ([address])   . Gout   . History of pulmonary embolus (PE)    from a DVT in 2008  . Hyperglycemia   . Hyperlipidemia   . Hypertension   . Mallory-Weiss tear   . Peripheral vascular disease ([place])   . Stroke ([place]) 09/2015  . SVT (supraventricular tachycardia) ([place])   . Vitamin B12 deficiency     Past Surgical History:  Procedure Laterality Date  .  PTCA of coronary lesion Right 1995  . APPENDECTOMY    . EP IMPLANTABLE DEVICE N/A 12/01/2015   Procedure: Loop Recorder Insertion;  Surgeon: Evans Lance, MD;  Location: [facility];  Service: Cardiovascular;  Laterality: N/A;  . PR  VEIN BYPASS GRAFT,AORTO-FEM-POP Left    femoral-popliteal BP by Dr. Drucie Opitz  . repair of aal fissure   1978    Medications: Prior to Admission medications   Medication Sig Start Date End Date Taking? Authorizing Provider  acetaminophen (TYLENOL) 500 MG tablet Take 500 mg by mouth every 6 (six) hours as needed for fever.     [provider]  amLODipine (NORVASC) 10 MG tablet Take 1 tablet (10 mg total) by mouth daily. 11/12/15   Angiulli, Lavon Paganini, PA-C  aspirin 325 MG tablet Take 1 tablet (325 mg total) by mouth daily. 10/21/15   Patrecia Pour, MD  clopidogrel (PLAVIX) 75 MG tablet Take 1 tablet (75 mg total) by mouth daily. 11/11/15   Angiulli, Lavon Paganini, PA-C  diclofenac sodium (VOLTAREN) 1 % GEL Apply 2 g topically 4  (four) times daily. 11/27/15   Kirsteins, Luanna Salk, MD  famotidine (PEPCID) 20 MG tablet Take 1 tablet (20 mg total) by mouth 2 (two) times daily. Patient taking differently: Take 20 mg by mouth 2 (two) times daily as needed.  11/11/15   Angiulli, Lavon Paganini, PA-C  glimepiride (AMARYL) 2 MG tablet Take 1 tablet (2 mg total) by mouth daily with breakfast. 11/12/15   Angiulli, Lavon Paganini, PA-C  lisinopril (PRINIVIL,ZESTRIL) 20 MG tablet Take 20 mg by mouth daily.    [provider]    Allergies:   Allergies  Allergen Reactions  . Cephalexin     Other reaction(s): muscle/knee pain  . Glimepiride     Other reaction(s): headache  . Hydralazine Nausea Only  . Lipitor [Atorvastatin]     Muscle pain   . Zetia [Ezetimibe] Hives    Social History:  reports that he has never smoked. He has never used smokeless tobacco. He reports that he does not drink alcohol and does not use drugs.  Family History: Family History  Problem Relation Age of Onset  . Cancer Mother        bladder  . CAD Father   . Heart attack Father      Objective   Physical Exam: Blood pressure (!) 155/85, pulse (!) 102, temperature 98.6 F (37 C), temperature source Oral, resp. rate 16, height 5\' 8"  (1.727 m), weight 78.2 kg, SpO2 96 %.  Physical Exam Vitals and nursing note reviewed.  Constitutional:      Appearance: Normal appearance.  HENT:     Head: Normocephalic and atraumatic.  Eyes:     Conjunctiva/sclera: Conjunctivae normal.  Cardiovascular:     Rate and Rhythm: Normal rate and regular rhythm.  Pulmonary:     Effort: Pulmonary effort is normal.     Breath sounds: Normal breath sounds.  Abdominal:     General: Abdomen is flat.     Palpations: Abdomen is soft.  Musculoskeletal:        General: No swelling or tenderness.  Skin:    Coloration: Skin is not jaundiced or pale.  Neurological:     Mental Status: He is alert. Mental status is at baseline.     Comments: Right-sided upper and lower  extremities weakness  Psychiatric:        Mood and Affect: Mood normal.        Behavior: Behavior normal.     LABS on Admission: I have personally reviewed all the labs and imaging below    Basic Metabolic Panel: Recent Labs  Lab 05/10/20 1138  NA 137  K 4.3  CL 108  CO2 22  GLUCOSE 169*  BUN 19  CREATININE 1.53*  CALCIUM 8.7*   Liver Function Tests: Recent Labs  Lab 05/10/20 1150  AST 17  ALT 18  ALKPHOS 40  BILITOT 0.9  PROT 6.5  ALBUMIN 3.4*   Recent Labs  Lab 05/10/20 1150  LIPASE 37   No results for input(s): AMMONIA in the last 168 hours. CBC: Recent Labs  Lab 05/10/20 1138  WBC 12.1*  HGB 15.6  HCT 48.1  MCV 93.6  PLT 178   Cardiac Enzymes: No results for input(s): CKTOTAL, CKMB, CKMBINDEX, TROPONINI in the last 168 hours. BNP: Invalid input(s): POCBNP CBG: Recent Labs  Lab 05/10/20 1901  GLUCAP 212*    Radiological Exams on Admission:  CT Head Wo Contrast  Result Date: 05/10/2020 CLINICAL DATA:  TIA symptoms EXAM: CT HEAD WITHOUT CONTRAST TECHNIQUE: Contiguous axial images were obtained from the base of the skull through the vertex without intravenous contrast. COMPARISON:  October 15, 2018 FINDINGS: Brain: Moderate diffuse atrophy is stable. There is no intracranial mass, hemorrhage, extra-axial fluid collection, or midline shift. There is decreased attenuation in the centra semiovale bilaterally, stable. There is a focal area of decreased attenuation in the left lower pons, consistent with a prior small infarct in this area. No acute appearing infarct evident. Vascular: No hyperdense vessel. Foci of calcification noted in each distal vertebral artery and carotid siphon region. Skull: The bony calvarium appears intact. Sinuses/Orbits: Visualized paranasal sinuses are clear. Visualized orbits appear symmetric bilaterally. Other: Mastoid air cells are clear. IMPRESSION: Stable atrophy with periventricular small vessel disease. Prior small infarct  inferior left pons. No acute infarct evident. No mass or hemorrhage. There are multiple foci of arterial vascular calcification. Electronically Signed   By: Lowella Grip III M.D.   On: 05/10/2020 14:35   DG Chest Portable 1 View  Result Date: 05/10/2020 CLINICAL DATA:  Syncope EXAM: PORTABLE CHEST 1 VIEW COMPARISON:  10/15/2018 FINDINGS: The heart size and mediastinal contours are within normal limits. Both lungs are clear. The visualized skeletal structures are unremarkable. IMPRESSION: No acute abnormality of the lungs. Electronically Signed   By: Eddie Candle M.D.   On: 05/10/2020 12:34      EKG: unchanged from previous tracings, known LBBB   A & P   Principal Problem:   Syncope Active Problems:   Benign essential HTN   CKD (chronic kidney disease)   DM type 2 without retinopathy ([place])   1. Syncope vs presyncope, suspect vasovagal vs. Less likely seizure a. Prodromal symptoms of nausea, pallor while eating without postictal episode though possible staring spell and speech changes?  Appears back to baseline currently b. CT head-prior small infarct of the inferior left pons without acute infarct and multiple foci of arterial vascular calcification c. Telemetry d. Loop recorder interrogation e. Echo given his cardiac history and no recent echo on file f. If recurrence then would consider EEG g. PT/OT eval  2. Chronic dry cough a. Possibly GERD vs. Aspiration with history of stroke vs. other  b. CXR unremarkable c. Start Protonix PO daily d. SLP eval  3. Left temporal and left pontine stroke in 2017 with residual right-sided weakness, at baseline a. Checking lipid panel b. He is on aspirin, plavix and statin  4. Hypertension a. Continue home amlodipine b. Holding home lisinopril  5. Hyperlipidemia a. Continue Crestor and checking lipid panel  6. Type 2 diabetes a. Not on meds   b. Hb A1c c. Sliding scale  7. CKD 3 a,  at baseline    DVT prophylaxis: SCDs    Code Status: Full Code  Diet: heart healthy Family Communication: Admission, patients condition and plan of care including tests being ordered have been discussed with the patient who indicates understanding and agrees with the plan and Code Status. Patient's daughter was updated  Disposition Plan: The appropriate patient status for this patient is OBSERVATION. Observation status is judged to be reasonable and necessary in order to provide the required intensity of service to ensure the patient's safety. The patient's presenting symptoms, physical exam findings, and initial radiographic and laboratory data in the context of their medical condition is felt to place them at decreased risk for further clinical deterioration. Furthermore, it is anticipated that the patient will be medically stable for discharge from the hospital within 2 midnights of admission. The following factors support the patient status of observation.   " The patient's presenting symptoms include syncope vs. presyncope. " The physical exam findings include unremarkable. " The initial radiographic and laboratory data are unremarkable.    Status is: Observation  The patient remains OBS appropriate and will d/c before 2 midnights.  Dispo: The patient is from: Home              Anticipated d/c is to: Home              Patient currently is not medically stable to d/c.   Difficult to place patient No   Consultants  . none  Procedures  . none  Time Spent on Admission: 62 minutes    Harold Hedge, DO [facility]  05/10/2020, 7:05 PM

## 2020-05-10 NOTE — ED Notes (Signed)
ED TO INPATIENT HANDOFF REPORT  Name/Age/Gender Yetta Numbers 87 y.o. male  Code Status    Code Status Orders  (From admission, onward)         Start     Ordered   05/10/20 1455  Do not attempt resuscitation (DNR)  Continuous       Question Answer Comment  In the event of cardiac or respiratory ARREST Do not call a "code blue"   In the event of cardiac or respiratory ARREST Do not perform Intubation, CPR, defibrillation or ACLS   In the event of cardiac or respiratory ARREST Use medication by any route, position, wound care, and other measures to relive pain and suffering. May use oxygen, suction and manual treatment of airway obstruction as needed for comfort.      05/10/20 1454        Code Status History    Date Active Date Inactive Code Status Order ID Comments User Context   10/21/2015 1721 11/11/2015 1641 Full Code [id number]  Elizabeth Sauer Inpatient   10/21/2015 1721 10/21/2015 1721 Full Code [id number]  Elizabeth Sauer Inpatient   10/14/2015 1802 10/21/2015 1715 Full Code [id number]  Lavina Hamman, MD ED   Advance Care Planning Activity      Home/SNF/Other Home  Chief Complaint Syncope [R55]  Level of Care/Admitting Diagnosis ED Disposition    ED Disposition Condition [place]: [facility] [[id number]]  Level of Care: Telemetry [5]  Admit to tele based on following criteria: Eval of Syncope  Covid Evaluation: Asymptomatic Screening Protocol (No Symptoms)  Diagnosis: Syncope [206001]  Admitting Physician: Harold Hedge [[id number]]  Attending Physician: Harold Hedge [[id number]]       Medical History Past Medical History:  Diagnosis Date  . Adenomatous polyp   . CAD (coronary artery disease)   . Chronic kidney disease   . Diverticulosis   . DVT (deep venous thrombosis) ([place])   . Gout   . History of pulmonary embolus (PE)    from a DVT in 2008  . Hyperglycemia   . Hyperlipidemia   . Hypertension   .  Mallory-Weiss tear   . Peripheral vascular disease ([place])   . Stroke ([place]) 09/2015  . SVT (supraventricular tachycardia) ([place])   . Vitamin B12 deficiency     Allergies Allergies  Allergen Reactions  . Cephalexin     Other reaction(s): muscle/knee pain  . Glimepiride     Other reaction(s): headache  . Hydralazine Nausea Only  . Lipitor [Atorvastatin]     Muscle pain   . Zetia [Ezetimibe] Hives    IV Location/Drains/Wounds Patient Lines/Drains/Airways Status    Active Line/Drains/Airways    Name Placement date Placement time Site Days   Peripheral IV Left;Posterior Hand --  --  Hand  --          Labs/Imaging Results for orders placed or performed during the hospital encounter of 05/10/20 (from the past 48 hour(s))  Basic metabolic panel     Status: Abnormal   Collection Time: 05/10/20 11:38 AM  Result Value Ref Range   Sodium 137 135 - 145 mmol/L   Potassium 4.3 3.5 - 5.1 mmol/L   Chloride 108 98 - 111 mmol/L   CO2 22 22 - 32 mmol/L   Glucose, Bld 169 (H) 70 - 99 mg/dL    Comment: Glucose reference range applies only to samples taken after fasting for at least 8 hours.   BUN  19 8 - 23 mg/dL   Creatinine, Ser 1.53 (H) 0.61 - 1.24 mg/dL   Calcium 8.7 (L) 8.9 - 10.3 mg/dL   GFR, Estimated 43 (L) >60 mL/min    Comment: (NOTE) Calculated using the CKD-EPI Creatinine Equation (2021)    Anion gap 7 5 - 15    Comment: Performed at [facility], [place] [address], [place], [place] [address]  CBC     Status: Abnormal   Collection Time: 05/10/20 11:38 AM  Result Value Ref Range   WBC 12.1 (H) 4.0 - 10.5 K/uL   RBC 5.14 4.22 - 5.81 MIL/uL   Hemoglobin 15.6 13.0 - 17.0 g/dL   HCT 48.1 39.0 - 52.0 %   MCV 93.6 80.0 - 100.0 fL   MCH 30.4 26.0 - 34.0 pg   MCHC 32.4 30.0 - 36.0 g/dL   RDW 13.9 11.5 - 15.5 %   Platelets 178 150 - 400 K/uL   nRBC 0.0 0.0 - 0.2 %    Comment: Performed at [facility], [place] [address], [place], [place]  [address]  Hepatic function panel     Status: Abnormal   Collection Time: 05/10/20 11:50 AM  Result Value Ref Range   Total Protein 6.5 6.5 - 8.1 g/dL   Albumin 3.4 (L) 3.5 - 5.0 g/dL   AST 17 15 - 41 U/L   ALT 18 0 - 44 U/L   Alkaline Phosphatase 40 38 - 126 U/L   Total Bilirubin 0.9 0.3 - 1.2 mg/dL   Bilirubin, Direct 0.2 0.0 - 0.2 mg/dL   Indirect Bilirubin 0.7 0.3 - 0.9 mg/dL    Comment: Performed at [facility], [place] [address], [place], [place] [address]  Lipase, blood     Status: None   Collection Time: 05/10/20 11:50 AM  Result Value Ref Range   Lipase 37 11 - 51 U/L    Comment: Performed at [facility], [place] [address] [place], [place] [address]  Troponin I (High Sensitivity)     Status: None   Collection Time: 05/10/20 11:50 AM  Result Value Ref Range   Troponin I (High Sensitivity) 9 <18 ng/L    Comment: (NOTE) Elevated high sensitivity troponin I (hsTnI) values and significant  changes across serial measurements may suggest ACS but many other  chronic and acute conditions are known to elevate hsTnI results.  Refer to the "Links" section for chest pain algorithms and additional  guidance. Performed at [facility], [place] [address] [place], [place] [address]   Resp Panel by RT-PCR (Flu A&B, Covid) Nasopharyngeal Swab     Status: None   Collection Time: 05/10/20 11:50 AM   Specimen: Nasopharyngeal Swab; Nasopharyngeal(NP) swabs in vial transport medium  Result Value Ref Range   SARS Coronavirus 2 by RT PCR NEGATIVE NEGATIVE    Comment: (NOTE) SARS-CoV-2 target nucleic acids are NOT DETECTED.  The SARS-CoV-2 RNA is generally detectable in upper respiratory specimens during the acute phase of infection. The lowest concentration of SARS-CoV-2 viral copies this assay can detect is 138 copies/mL. A negative result does not preclude SARS-Cov-2 infection and should not be used as the sole basis for treatment or other  patient management decisions. A negative result may occur with  improper specimen collection/handling, submission of specimen other than nasopharyngeal swab, presence of viral mutation(s) within the areas targeted by this assay, and inadequate number of viral copies(<138 copies/mL). A negative result must be combined with clinical  observations, patient history, and epidemiological information. The expected result is Negative.  Fact Sheet for Patients:  EntrepreneurPulse.com.au  Fact Sheet for Healthcare Providers:  IncredibleEmployment.be  This test is no t yet approved or cleared by the [place] FDA and  has been authorized for detection and/or diagnosis of SARS-CoV-2 by FDA under an Emergency Use Authorization (EUA). This EUA will remain  in effect (meaning this test can be used) for the duration of the COVID-19 declaration under Section 564(b)(1) of the Act, 21 U.S.C.section 360bbb-3(b)(1), unless the authorization is terminated  or revoked sooner.       Influenza A by PCR NEGATIVE NEGATIVE   Influenza B by PCR NEGATIVE NEGATIVE    Comment: (NOTE) The Xpert Xpress SARS-CoV-2/FLU/RSV plus assay is intended as an aid in the diagnosis of influenza from Nasopharyngeal swab specimens and should not be used as a sole basis for treatment. Nasal washings and aspirates are unacceptable for Xpert Xpress SARS-CoV-2/FLU/RSV testing.  Fact Sheet for Patients: EntrepreneurPulse.com.au  Fact Sheet for Healthcare Providers: IncredibleEmployment.be  This test is not yet approved or cleared by the [place] FDA and has been authorized for detection and/or diagnosis of SARS-CoV-2 by FDA under an Emergency Use Authorization (EUA). This EUA will remain in effect (meaning this test can be used) for the duration of the COVID-19 declaration under Section 564(b)(1) of the Act, 21 U.S.C. section 360bbb-3(b)(1), unless  the authorization is terminated or revoked.  Performed at [facility], [place] [address] [place], [place] [address]   Troponin I (High Sensitivity)     Status: None   Collection Time: 05/10/20  1:50 PM  Result Value Ref Range   Troponin I (High Sensitivity) 11 <18 ng/L    Comment: (NOTE) Elevated high sensitivity troponin I (hsTnI) values and significant  changes across serial measurements may suggest ACS but many other  chronic and acute conditions are known to elevate hsTnI results.  Refer to the "Links" section for chest pain algorithms and additional  guidance. Performed at [facility], [place] [address] [place], [place] [address]   Urinalysis, Routine w reflex microscopic Urine, Clean Catch     Status: Abnormal   Collection Time: 05/10/20  2:00 PM  Result Value Ref Range   Color, Urine YELLOW YELLOW   APPearance CLEAR CLEAR   Specific Gravity, Urine 1.012 1.005 - 1.030   pH 5.0 5.0 - 8.0   Glucose, UA NEGATIVE NEGATIVE mg/dL   Hgb urine dipstick NEGATIVE NEGATIVE   Bilirubin Urine NEGATIVE NEGATIVE   Ketones, ur 5 (A) NEGATIVE mg/dL   Protein, ur NEGATIVE NEGATIVE mg/dL   Nitrite NEGATIVE NEGATIVE   Leukocytes,Ua NEGATIVE NEGATIVE    Comment: Performed at [place] [address], [place], [place] [address]   CT Head Wo Contrast  Result Date: 05/10/2020 CLINICAL DATA:  TIA symptoms EXAM: CT HEAD WITHOUT CONTRAST TECHNIQUE: Contiguous axial images were obtained from the base of the skull through the vertex without intravenous contrast. COMPARISON:  October 15, 2018 FINDINGS: Brain: Moderate diffuse atrophy is stable. There is no intracranial mass, hemorrhage, extra-axial fluid collection, or midline shift. There is decreased attenuation in the centra semiovale bilaterally, stable. There is a focal area of decreased attenuation in the left lower pons, consistent with a prior small infarct in this area. No acute  appearing infarct evident. Vascular: No hyperdense vessel. Foci of calcification noted in each distal vertebral artery and carotid siphon region. Skull: The bony calvarium appears intact. Sinuses/Orbits:  Visualized paranasal sinuses are clear. Visualized orbits appear symmetric bilaterally. Other: Mastoid air cells are clear. IMPRESSION: Stable atrophy with periventricular small vessel disease. Prior small infarct inferior left pons. No acute infarct evident. No mass or hemorrhage. There are multiple foci of arterial vascular calcification. Electronically Signed   By: Lowella Grip III M.D.   On: 05/10/2020 14:35   DG Chest Portable 1 View  Result Date: 05/10/2020 CLINICAL DATA:  Syncope EXAM: PORTABLE CHEST 1 VIEW COMPARISON:  10/15/2018 FINDINGS: The heart size and mediastinal contours are within normal limits. Both lungs are clear. The visualized skeletal structures are unremarkable. IMPRESSION: No acute abnormality of the lungs. Electronically Signed   By: Eddie Candle M.D.   On: 05/10/2020 12:34    Pending Labs Unresulted Labs (From admission, onward)          Start     Ordered   05/11/20 1572  Basic metabolic panel  Tomorrow morning,   R        05/10/20 1454   05/11/20 0500  CBC  Tomorrow morning,   R        05/10/20 1454          Vitals/Pain Today's Vitals   05/10/20 1245 05/10/20 1315 05/10/20 1345 05/10/20 1400  BP: 139/72 138/68 (!) 160/81 (!) 156/79  Pulse: 77 74 82 84  Resp: 16 14 16 16   Temp:      TempSrc:      SpO2: 96% 93% 94% 96%  PainSc:        Isolation Precautions No active isolations  Medications Medications  aspirin tablet 325 mg (has no administration in time range)  amLODipine (NORVASC) tablet 10 mg (has no administration in time range)  rosuvastatin (CRESTOR) tablet 5 mg (has no administration in time range)  clopidogrel (PLAVIX) tablet 75 mg (has no administration in time range)  sodium chloride flush (NS) 0.9 % injection 3 mL (has no  administration in time range)  enoxaparin (LOVENOX) injection 40 mg (has no administration in time range)  pantoprazole (PROTONIX) EC tablet 40 mg (has no administration in time range)  sodium chloride 0.9 % bolus 500 mL (500 mLs Intravenous Bolus 05/10/20 1201)

## 2020-05-10 NOTE — Evaluation (Signed)
Clinical/Bedside Swallow Evaluation Patient Details  Name: [name] MRN: [id number] Date of Birth: [birth year]  Today's Date: 05/10/2020 Time: SLP Start Time (ACUTE ONLY): 1800 SLP Stop Time (ACUTE ONLY): 1825 SLP Time Calculation (min) (ACUTE ONLY): 25 min  Past Medical History:  Past Medical History:  Diagnosis Date  . Adenomatous polyp   . CAD (coronary artery disease)   . Chronic kidney disease   . Diverticulosis   . DVT (deep venous thrombosis) ([place])   . Gout   . History of pulmonary embolus (PE)    from a DVT in 2008  . Hyperglycemia   . Hyperlipidemia   . Hypertension   . Mallory-Weiss tear   . Peripheral vascular disease ([place])   . Stroke ([place]) 09/2015  . SVT (supraventricular tachycardia) ([place])   . Vitamin B12 deficiency    Past Surgical History:  Past Surgical History:  Procedure Laterality Date  .  PTCA of coronary lesion Right 1995  . APPENDECTOMY    . EP IMPLANTABLE DEVICE N/A 12/01/2015   Procedure: Loop Recorder Insertion;  Surgeon: Evans Lance, MD;  Location: [facility];  Service: Cardiovascular;  Laterality: N/A;  . PR VEIN BYPASS GRAFT,AORTO-FEM-POP Left    femoral-popliteal BP by Dr. Drucie Opitz  . repair of aal fissure   1978   HPI:  Patient is a 87 y.o. male with PMH: CAD, CKD, HTN, HLD, PVD, left temporal and left pontine CVA in 2017 (residual right sided weakness, was in CIR), DM-2, who was brought to ED by ambulance after witnessed syncopal versus presyncopal episode this morning while eating breakfast with his son.  He suddenly had become diaphoretic and nauseated, felt light headed, became pale and questionably unresponsive per ED note. He vomited when EMS was present. CT did not reveal any acute intracranial abnormality, CXR unremarkable.  Esophagram in 2017 was WNL and MBS in 2018 was WFL-WNL.   Assessment / Plan / Recommendation Clinical Impression  Patient presents with what appears to be a primary esophageal dysphagia but cannot r/o  an esophageal component. Patient exhibited immediate belching and globus sensation with sips of thin liquids and did exhibit a very delayed cough. His voice prior to and after PO's did not change but was mildly hoarse. (granddaughter reports this is current baseline). Granddaughter and patient both reported that he was able to consume grilled cheese sandwich and some banana pudding without difficulty. SLP will check in with patient and POC tomorrow in AM to determine if MBS is needed/warranted. SLP Visit Diagnosis: Dysphagia, unspecified (R13.10)    Aspiration Risk  Mild aspiration risk    Diet Recommendation Regular;Thin liquid   Liquid Administration via: Cup;Straw Medication Administration: Whole meds with liquid Supervision: Patient able to self feed Compensations: Minimize environmental distractions;Slow rate;Small sips/bites Postural Changes: Seated upright at 90 degrees;Remain upright for at least 30 minutes after po intake    Other  Recommendations Recommended Consults: Consider esophageal assessment Oral Care Recommendations: Oral care BID   Follow up Recommendations None      Frequency and Duration min 1 x/week  1 week       Prognosis Prognosis for Safe Diet Advancement: Good      Swallow Study   General Date of Onset: 05/10/20 HPI: Patient is a 87 y.o. male with PMH: CAD, CKD, HTN, HLD, PVD, left temporal and left pontine CVA in 2017 (residual right sided weakness, was in CIR), DM-2, who was brought to ED by ambulance after witnessed syncopal versus presyncopal episode this  morning while eating breakfast with his son.  He suddenly had become diaphoretic and nauseated, felt light headed, became pale and questionably unresponsive per ED note. He vomited when EMS was present. CT did not reveal any acute intracranial abnormality, CXR unremarkable.  Esophagram in 2017 was WNL and MBS in 2018 was WFL-WNL. Type of Study: Bedside Swallow Evaluation Previous Swallow Assessment: MBS  2018 Diet Prior to this Study: Regular;Thin liquids Temperature Spikes Noted: No Respiratory Status: Room air History of Recent Intubation: No Behavior/Cognition: Alert;Pleasant mood;Cooperative Oral Cavity Assessment: Within Functional Limits Oral Care Completed by SLP: No Oral Cavity - Dentition: Adequate natural dentition Vision: Functional for self-feeding Self-Feeding Abilities: Able to feed self Patient Positioning: Upright in bed Baseline Vocal Quality: Hoarse Volitional Cough: Strong Volitional Swallow: Able to elicit    Oral/Motor/Sensory Function Overall Oral Motor/Sensory Function: Mild impairment Facial ROM: Within Functional Limits Facial Symmetry: Within Functional Limits Facial Strength: Reduced right (not very noticeable, patient reports he drools on right side mouth) Lingual ROM: Within Functional Limits Lingual Symmetry: Within Functional Limits Lingual Strength: Within Functional Limits   Ice Chips     Thin Liquid Thin Liquid: Impaired Presentation: Straw;Self Fed Pharyngeal  Phase Impairments: Other (comments);Cough - Delayed Other Comments: patient with immediate belching and globus sensation    Nectar Thick     Honey Thick     Puree Puree: Not tested   Solid     Solid: Not tested     Sonia Baller, MA, CCC-SLP Speech Therapy

## 2020-05-10 NOTE — ED Triage Notes (Signed)
Patient BIBA from home c/o syncopal episode that occurred this morning while he was sitting at table eating breakfast. Patient was diaphoretic and pale upon EMS arrival.  Syncopal episode was witnessed by his son. Patient also c/o n/v en route to ED.  4mg  Zofran  400 cc NS   BP 172/80 SpO2 96% RA  CBG 165

## 2020-05-10 NOTE — Progress Notes (Signed)
[employer] representative notified of request for loop recorder interrogation. States will be out in the morning to perform.  MD notified.

## 2020-05-10 NOTE — ED Provider Notes (Signed)
Emergency Department Provider Note   I have reviewed the triage vital signs and the nursing notes.   HISTORY  Chief Complaint Loss of Consciousness   HPI [name] is a 87 y.o. male with past medical history reviewed below presents to the emergency department with syncope episode this morning.  Patient states he been in his normal state of health and feeling well when this morning he became diaphoretic and nauseated.  He felt lightheaded and apparently had a syncope event witnessed by family at home.  They describe him as looking very pale and then became unresponsive briefly but then regained consciousness.  EMS was called and arrived on scene who gave a small IV fluid bolus along with some Zofran.  Vital signs remained unremarkable in route.  Patient is currently asymptomatic.  Past Medical History:  Diagnosis Date  . Adenomatous polyp   . CAD (coronary artery disease)   . Chronic kidney disease   . Diverticulosis   . DVT (deep venous thrombosis) ([place])   . Gout   . History of pulmonary embolus (PE)    from a DVT in 2008  . Hyperglycemia   . Hyperlipidemia   . Hypertension   . Mallory-Weiss tear   . Peripheral vascular disease ([place])   . Stroke ([place]) 09/2015  . SVT (supraventricular tachycardia) ([place])   . Vitamin B12 deficiency     Patient Active Problem List   Diagnosis Date Noted  . Syncope 05/10/2020  . Gait disturbance, post-stroke 11/23/2015  . Hemiparesis affecting right side as late effect of cerebrovascular accident (CVA) ([place]) 11/23/2015  . Acute blood loss anemia   . Acute lower UTI   . Right elbow pain   . Nausea without vomiting   . Urinary retention   . 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 Urinary hesitancy   . Right sided weakness   . Acute ischemic left MCA stroke ([place])   . Hematuria   . Bleeding   . Leukocytosis   . Depression   . Steroid-induced hyperglycemia   . Left pontine CVA ([place]) 10/21/2015  .  Dysarthria, post-stroke   . Right hemiparesis ([place])   . Sinus arrest   . CKD (chronic kidney disease)   . DM type 2 without retinopathy ([place])   . Coronary artery disease involving native coronary artery of native heart without angina pectoris   . Acute idiopathic gout of right hand   . Aphasia   . Dysphagia, post-stroke   . HLD (hyperlipidemia)   . Benign essential HTN   . Orthostatic hypotension   . 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 CVA (cerebral infarction) 10/14/2015  . Accelerated hypertension 10/14/2015  . Type 2 diabetes mellitus with circulatory disorder ([place]) 10/14/2015  . Peripheral vascular disease, unspecified ([place]) 06/04/2013    Past Surgical History:  Procedure Laterality Date  .  PTCA of coronary lesion Right 1995  . APPENDECTOMY    . EP IMPLANTABLE DEVICE N/A 12/01/2015   Procedure: Loop Recorder Insertion;  Surgeon: Evans Lance, MD;  Location: [facility];  Service: Cardiovascular;  Laterality: N/A;  . PR VEIN BYPASS GRAFT,AORTO-FEM-POP Left    femoral-popliteal BP by Dr. Drucie Opitz  . repair of aal fissure   1978    Allergies Cephalexin, Glimepiride, Hydralazine, Lipitor [atorvastatin], and Zetia [ezetimibe]  Family History  Problem Relation Age of Onset  . Cancer Mother  bladder  . CAD Father   . Heart attack Father     Social History Social History   Tobacco Use  . Smoking status: Never Smoker  . Smokeless tobacco: Never Used  Substance Use Topics  . Alcohol use: No  . Drug use: No    Review of Systems  Constitutional: No fever/chills Eyes: No visual changes. ENT: No sore throat. Cardiovascular: Denies chest pain. Positive syncope and diaphoresis.  Respiratory: Denies shortness of breath. Gastrointestinal: No abdominal pain. Positive nausea, no vomiting.  No diarrhea.  No constipation. Genitourinary: Negative for dysuria. Musculoskeletal: Negative for back pain. Skin:  Negative for rash. Neurological: Negative for headaches, focal weakness or numbness.  10-point ROS otherwise negative.  ____________________________________________   PHYSICAL EXAM:  VITAL SIGNS: ED Triage Vitals  Enc Vitals Group     BP 05/10/20 1140 (!) 147/67     Pulse Rate 05/10/20 1140 75     Resp 05/10/20 1140 16     Temp 05/10/20 1200 97.8 F (36.6 C)     Temp Source 05/10/20 1200 Oral     SpO2 05/10/20 1140 94 %    Constitutional: Alert and oriented. Well appearing and in no acute distress. Eyes: Conjunctivae are normal.  Head: Atraumatic. Nose: No congestion/rhinnorhea. Mouth/Throat: Mucous membranes are moist.  Neck: No stridor.   Cardiovascular: Normal rate, regular rhythm. Good peripheral circulation. Grossly normal heart sounds.   Respiratory: Normal respiratory effort.  No retractions. Lungs CTAB. Gastrointestinal: Soft and nontender. No distention.  Musculoskeletal: No lower extremity tenderness nor edema. No gross deformities of extremities. Neurologic:  Normal speech and language. No gross focal neurologic deficits are appreciated.  Skin:  Skin is warm, dry and intact. No rash noted.   ____________________________________________   LABS (all labs ordered are listed, but only abnormal results are displayed)  Labs Reviewed  BASIC METABOLIC PANEL - Abnormal; Notable for the following components:      Result Value   Glucose, Bld 169 (*)    Creatinine, Ser 1.53 (*)    Calcium 8.7 (*)    GFR, Estimated 43 (*)    All other components within normal limits  CBC - Abnormal; Notable for the following components:   WBC 12.1 (*)    All other components within normal limits  URINALYSIS, ROUTINE W REFLEX MICROSCOPIC - Abnormal; Notable for the following components:   Ketones, ur 5 (*)    All other components within normal limits  HEPATIC FUNCTION PANEL - Abnormal; Notable for the following components:   Albumin 3.4 (*)    All other components within normal  limits  RESP PANEL BY RT-PCR (FLU A&B, COVID) ARPGX2  LIPASE, BLOOD  BASIC METABOLIC PANEL  CBC  TROPONIN I (HIGH SENSITIVITY)  TROPONIN I (HIGH SENSITIVITY)   ____________________________________________  EKG   EKG Interpretation  Date/Time:  Sunday May 10 2020 11:43:19 EDT Ventricular Rate:  76 PR Interval:    QRS Duration: 126 QT Interval:  429 QTC Calculation: 483 R Axis:   -29 Text Interpretation: Sinus rhythm Left bundle branch block No significant change since Aug 2020 tracing Confirmed by Nanda Quinton [phone]) on 05/10/2020 12:18:14 PM       ____________________________________________  RADIOLOGY  CT Head Wo Contrast  Result Date: 05/10/2020 CLINICAL DATA:  TIA symptoms EXAM: CT HEAD WITHOUT CONTRAST TECHNIQUE: Contiguous axial images were obtained from the base of the skull through the vertex without intravenous contrast. COMPARISON:  October 15, 2018 FINDINGS: Brain: Moderate diffuse atrophy is stable. There is no  intracranial mass, hemorrhage, extra-axial fluid collection, or midline shift. There is decreased attenuation in the centra semiovale bilaterally, stable. There is a focal area of decreased attenuation in the left lower pons, consistent with a prior small infarct in this area. No acute appearing infarct evident. Vascular: No hyperdense vessel. Foci of calcification noted in each distal vertebral artery and carotid siphon region. Skull: The bony calvarium appears intact. Sinuses/Orbits: Visualized paranasal sinuses are clear. Visualized orbits appear symmetric bilaterally. Other: Mastoid air cells are clear. IMPRESSION: Stable atrophy with periventricular small vessel disease. Prior small infarct inferior left pons. No acute infarct evident. No mass or hemorrhage. There are multiple foci of arterial vascular calcification. Electronically Signed   By: Lowella Grip III M.D.   On: 05/10/2020 14:35   DG Chest Portable 1 View  Result Date: 05/10/2020 CLINICAL  DATA:  Syncope EXAM: PORTABLE CHEST 1 VIEW COMPARISON:  10/15/2018 FINDINGS: The heart size and mediastinal contours are within normal limits. Both lungs are clear. The visualized skeletal structures are unremarkable. IMPRESSION: No acute abnormality of the lungs. Electronically Signed   By: Eddie Candle M.D.   On: 05/10/2020 12:34    ____________________________________________   PROCEDURES  Procedure(s) performed:   Procedures   ____________________________________________   INITIAL IMPRESSION / ASSESSMENT AND PLAN / ED COURSE  Pertinent labs & imaging results that were available during my care of the patient were reviewed by me and considered in my medical decision making (see chart for details).   Patient presents to the emergency department for evaluation of syncope event this morning.  Feeling well now.  Some nausea associated with the event.  Vasovagal syncope possible but given the patient's age and medical history feel that cardiogenic syncope is also high on my differential.  He is having a nontender abdomen and no subjective pain on exam.  EKG interpreted by me shows left bundle branch block which is similar to his prior tracings from 2020.  Will follow labs and reassess.   Labs are largely reassuring. No abnormality on telemetry. Daughter at bedside now and reports some trouble with speech but back to normal now. Will add on CT head with is negative. Very low suspicion for TIA. Plan for admit for syncope w/u.   Discussed patient's case with [facility] to request admission. Patient and family (if present) updated with plan. Care transferred to [facility] service.  I reviewed all nursing notes, vitals, pertinent old records, EKGs, labs, imaging (as available).  ____________________________________________  FINAL CLINICAL IMPRESSION(S) / ED DIAGNOSES  Final diagnoses:  Near syncope  Nausea without vomiting     MEDICATIONS GIVEN DURING THIS VISIT:  Medications  aspirin tablet 325  mg (325 mg Oral Given 05/10/20 1531)  amLODipine (NORVASC) tablet 10 mg (10 mg Oral Given 05/10/20 1531)  rosuvastatin (CRESTOR) tablet 5 mg (has no administration in time range)  clopidogrel (PLAVIX) tablet 75 mg (75 mg Oral Given 05/10/20 1531)  sodium chloride flush (NS) 0.9 % injection 3 mL (has no administration in time range)  pantoprazole (PROTONIX) EC tablet 40 mg (40 mg Oral Given 05/10/20 1532)  enoxaparin (LOVENOX) injection 40 mg (has no administration in time range)  sodium chloride 0.9 % bolus 500 mL (500 mLs Intravenous Bolus 05/10/20 1201)    Note:  This document was prepared using Dragon voice recognition software and may include unintentional dictation errors.  Nanda Quinton, MD, [facility] Emergency Medicine    Cayli Escajeda, [facility], MD 05/10/20 [phone]

## 2020-05-11 ENCOUNTER — Observation Stay (HOSPITAL_BASED_OUTPATIENT_CLINIC_OR_DEPARTMENT_OTHER): Payer: Medicare Other

## 2020-05-11 DIAGNOSIS — E119 Type 2 diabetes mellitus without complications: Secondary | ICD-10-CM | POA: Diagnosis not present

## 2020-05-11 DIAGNOSIS — R55 Syncope and collapse: Secondary | ICD-10-CM | POA: Diagnosis not present

## 2020-05-11 DIAGNOSIS — I10 Essential (primary) hypertension: Secondary | ICD-10-CM

## 2020-05-11 DIAGNOSIS — R110 Nausea: Secondary | ICD-10-CM

## 2020-05-11 LAB — ECHOCARDIOGRAM COMPLETE
Area-P 1/2: 2.83 cm2
Height: 68 in
S' Lateral: 2.65 cm
Weight: 2747.81 oz

## 2020-05-11 LAB — BASIC METABOLIC PANEL
Anion gap: 8 (ref 5–15)
BUN: 19 mg/dL (ref 8–23)
CO2: 23 mmol/L (ref 22–32)
Calcium: 9 mg/dL (ref 8.9–10.3)
Chloride: 106 mmol/L (ref 98–111)
Creatinine, Ser: 1.4 mg/dL — ABNORMAL HIGH (ref 0.61–1.24)
GFR, Estimated: 48 mL/min — ABNORMAL LOW (ref >60)
Glucose, Bld: 91 mg/dL (ref 70–99)
Potassium: 4.1 mmol/L (ref 3.5–5.1)
Sodium: 137 mmol/L (ref 135–145)

## 2020-05-11 LAB — HEMOGLOBIN A1C
Hgb A1c MFr Bld: 7.3 % — ABNORMAL HIGH (ref 4.8–5.6)
Mean Plasma Glucose: 162.81 mg/dL

## 2020-05-11 LAB — CBC
HCT: 44.2 % (ref 39.0–52.0)
Hemoglobin: 14.5 g/dL (ref 13.0–17.0)
MCH: 29.9 pg (ref 26.0–34.0)
MCHC: 32.8 g/dL (ref 30.0–36.0)
MCV: 91.1 fL (ref 80.0–100.0)
Platelets: 170 10*3/uL (ref 150–400)
RBC: 4.85 MIL/uL (ref 4.22–5.81)
RDW: 13.9 % (ref 11.5–15.5)
WBC: 10.2 10*3/uL (ref 4.0–10.5)
nRBC: 0 % (ref 0.0–0.2)

## 2020-05-11 LAB — GLUCOSE, CAPILLARY
Glucose-Capillary: 93 mg/dL (ref 70–99)
Glucose-Capillary: 107 mg/dL — ABNORMAL HIGH (ref 70–99)
Glucose-Capillary: 133 mg/dL — ABNORMAL HIGH (ref 70–99)

## 2020-05-11 LAB — LIPID PANEL
Cholesterol: 155 mg/dL (ref 0–200)
HDL: 33 mg/dL — ABNORMAL LOW (ref >40)
LDL Cholesterol: 106 mg/dL — ABNORMAL HIGH (ref 0–99)
Total CHOL/HDL Ratio: 4.7 RATIO
Triglycerides: 78 mg/dL (ref <150)
VLDL: 16 mg/dL (ref 0–40)

## 2020-05-11 MED ORDER — PANTOPRAZOLE SODIUM 40 MG PO TBEC: 40.0000 mg | DELAYED_RELEASE_TABLET | Freq: Every day | ORAL | 3 refills | Status: DC

## 2020-05-11 MED ORDER — UNABLE TO FIND: 0 refills | Status: DC

## 2020-05-11 MED ORDER — ONDANSETRON 4 MG PO TBDP: 4.0000 mg | ORAL_TABLET | Freq: Three times a day (TID) | ORAL | 0 refills | Status: DC | PRN

## 2020-05-11 MED ORDER — LISINOPRIL 30 MG PO TABS: 30.0000 mg | ORAL_TABLET | Freq: Every day | ORAL | Status: DC

## 2020-05-11 NOTE — Evaluation (Signed)
Physical Therapy Evaluation Patient Details Name: [name] MRN: [id number] DOB: 21-May-1927 Today's Date: 05/11/2020   History of Present Illness  This is a 87 year old male with past medical history of CAD, CKD, hypertension, hyperlipidemia, PVD, Mallory-Weiss tear, left temporal and left pontine stroke in 2017 with residual right-sided weakness with a loop recorder in place, SVT, type 2 diabetes who was brought in by ambulance a witnessed syncopal versus presyncopal episode   Clinical Impression  The patient is   Alert, spoke with daughter  Who reports patient sounds near baseline as far as his speech and  Processing. Family available  24/7.  Patient ambulated x 200' using RW and close supervision. Recommend  HHPT.  HR noted at 121 briefly on monitor. Patient reports no dizziness.   Pt admitted with above diagnosis.  Pt currently with functional limitations due to the deficits listed below (see PT Problem List). Pt will benefit from skilled PT to increase their independence and safety with mobility to allow discharge to the venue listed below.       Follow Up Recommendations Home health PT    Equipment Recommendations  None recommended by PT    Recommendations for Other Services       Precautions / Restrictions Precautions Precautions: Fall Precaution Comments: has Loop recorder      Mobility  Bed Mobility Overal bed mobility: Needs Assistance Bed Mobility: Supine to Sit     Supine to sit: Supervision     General bed mobility comments: for safety, extra time to right trunk    Transfers Overall transfer level: Needs assistance Equipment used: Rolling walker (2 wheeled) Transfers: Sit to/from Stand Sit to Stand: Min guard         General transfer comment: cues for hand placement to stand and sit down.  Ambulation/Gait Ambulation/Gait assistance: Supervision Gait Distance (Feet): 200 Feet Assistive device: Rolling walker (2 wheeled) Gait Pattern/deviations:  Step-through pattern;Decreased step length - right   Gait velocity interpretation: <1.31 ft/sec, indicative of household ambulator General Gait Details: gait slow but steady with RW  Stairs            Wheelchair Mobility    Modified Rankin (Stroke Patients Only)       Balance Overall balance assessment: Needs assistance Sitting-balance support: No upper extremity supported;Feet supported Sitting balance-Leahy Scale: Fair     Standing balance support: During functional activity;Bilateral upper extremity supported Standing balance-Leahy Scale: Fair                               Pertinent Vitals/Pain Pain Assessment: No/denies pain    Home Living Family/patient expects to be discharged to:: Private residence Living Arrangements: Children Available Help at Discharge: Family Type of Home: House Home Access: Stairs to enter Entrance Stairs-Rails: Left Entrance Stairs-Number of Steps: 3   Home Equipment: Cane - single point Additional Comments: lives with daughter and son in law    Prior Function Level of Independence: Independent with assistive device(s)   Gait / Transfers Assistance Needed: amb withy a cane  ADL's / Homemaking Assistance Needed: sponge bathes        Hand Dominance        Extremity/Trunk Assessment        Lower Extremity Assessment Lower Extremity Assessment: Generalized weakness;RLE deficits/detail RLE Deficits / Details: mildly decreased speed of movemnt.    Cervical / Trunk Assessment Cervical / Trunk Assessment: Normal  Communication   Communication: Expressive difficulties  Cognition Arousal/Alertness: Awake/alert Behavior During Therapy: WFL for tasks assessed/performed Overall Cognitive Status: History of cognitive impairments - at baseline                                 General Comments: slow to answer, Oriented  to self, hospital, why he is here      General Comments      Exercises      Assessment/Plan    PT Assessment Patient needs continued PT services  PT Problem List Decreased strength;Decreased mobility;Decreased safety awareness;Decreased activity tolerance;Decreased balance;Decreased knowledge of use of DME;Decreased cognition       PT Treatment Interventions      PT Goals (Current goals can be found in the Care Plan section)  Acute Rehab PT Goals Patient Stated Goal: go home PT Goal Formulation: With patient Time For Goal Achievement: 05/25/20 Potential to Achieve Goals: Good    Frequency Min 3X/week   Barriers to discharge        Co-evaluation               AM-PAC PT "6 Clicks" Mobility  Outcome Measure Help needed turning from your back to your side while in a flat bed without using bedrails?: None Help needed moving from lying on your back to sitting on the side of a flat bed without using bedrails?: None Help needed moving to and from a bed to a chair (including a wheelchair)?: A Little Help needed standing up from a chair using your arms (e.g., wheelchair or bedside chair)?: A Little Help needed to walk in hospital room?: A Little Help needed climbing 3-5 steps with a railing? : A Little 6 Click Score: 20    End of Session Equipment Utilized During Treatment: Gait belt Activity Tolerance: Patient tolerated treatment well Patient left: in chair;with call bell/phone within reach;with chair alarm set Nurse Communication: Mobility status PT Visit Diagnosis: Hemiplegia and hemiparesis Hemiplegia - Right/Left: Right Hemiplegia - dominant/non-dominant: Dominant Hemiplegia - caused by: Other cerebrovascular disease    Time: 0852-0911 PT Time Calculation (min) (ACUTE ONLY): 19 min   Charges:   PT Evaluation $PT Eval Low Complexity: [place] PT Acute Rehabilitation Services Pager [phone] Office [phone]   Claretha Cooper 05/11/2020, 10:21 AM

## 2020-05-11 NOTE — Discharge Summary (Signed)
Physician Discharge Summary   Patient ID: [name] MRN: [id number] DOB/AGE: October 21, 1927 87 y.o.  Admit date: 05/10/2020 Discharge date: 05/11/2020  Primary Care Physician:  Lajean Manes, MD   Recommendations for Outpatient Follow-up:  1. Follow up with PCP in 1-2 weeks 2. Started on Protonix 40 mg daily  Home Health: Home health PT Equipment/Devices:   Discharge Condition: stable  CODE STATUS: FULL  Diet recommendation: Heart healthy diet   Discharge Diagnoses:    . syncope likely vasovagal   GERD . Benign essential HTN . CKD (chronic kidney disease) History of left temporal and left pontine stroke in 2017 with mild residual right-sided weakness, at baseline Hypertension Diabetes mellitus, type II, NIDDM, controlled CKD stage IIIa  Consults:  none    Allergies:   Allergies  Allergen Reactions  . Cephalexin     Other reaction(s): muscle/knee pain  . Glimepiride     Other reaction(s): headache  . Hydralazine Nausea Only  . Lipitor [Atorvastatin]     Muscle pain   . Zetia [Ezetimibe] Hives     DISCHARGE MEDICATIONS: Allergies as of 05/11/2020      Reactions   Cephalexin    Other reaction(s): muscle/knee pain   Glimepiride    Other reaction(s): headache   Hydralazine Nausea Only   Lipitor [atorvastatin]    Muscle pain   Zetia [ezetimibe] Hives      Medication List    TAKE these medications   amLODipine 10 MG tablet Commonly known as: NORVASC Take 1 tablet (10 mg total) by mouth daily.   aspirin 325 MG tablet Take 1 tablet (325 mg total) by mouth daily.   clopidogrel 75 MG tablet Commonly known as: PLAVIX Take 1 tablet (75 mg total) by mouth daily.   glimepiride 2 MG tablet Commonly known as: AMARYL Take 1 tablet (2 mg total) by mouth daily with breakfast.   lisinopril 30 MG tablet Commonly known as: ZESTRIL Take 1 tablet (30 mg total) by mouth daily. Start taking on: May 12, 2020   ondansetron 4 MG disintegrating tablet Commonly  known as: Zofran ODT Take 1 tablet (4 mg total) by mouth every 8 (eight) hours as needed for nausea or vomiting.   pantoprazole 40 MG tablet Commonly known as: PROTONIX Take 1 tablet (40 mg total) by mouth daily. Start taking on: May 12, 2020   rosuvastatin 5 MG tablet Commonly known as: CRESTOR Take 5 mg by mouth 2 (two) times a week. Monday and Friday   UNABLE TO FIND Outpatient physical therapy  Diagnosis: Prior history of CVA, generalized debility        Brief H and P: For complete details please refer to admission H and P, but in brief patient is a 87 year old male with history of CAD, CKD stage IIIa, hypertension, hyperlipidemia, PVD, Mallory-Weiss tear, history of prior stroke in 2017 with mild residual right-sided weakness, loop recorder in place, SVT, type 2 diabetes presented to ED with syncopal versus presyncopal episode while eating breakfast with his son.  Patient had been in his normal state of health until this morning when he was eating breakfast, had nausea and vomiting x1 per patient, was gagging.  Then he became diaphoretic, nauseated, felt lightheaded and questionably passed out.  Per patient and his daughter he did not pass out. Patient also has chronic dry cough, worse with lying down or eating.   Hospital Course:   Near syncope -Likely vasovagal episode.  Per daughter, he had prior similar episode when he had gagging  and vomiting. -No arrhythmias on telemetry.  [employer] rep was consulted.  I discussed with myself with the [employer] rep, the loop recorder was placed in 05-29-15 and the battery is dead, hence no interrogation Discussed with patient's daughter, she reported that they had not seen any atrial fibrillation in last 3 years and has discussed about discontinuing monitoring.  I did notify cardiology for outpatient follow-up, office will call with appointment. -PT evaluation was done, patient back to his baseline, recommended home health PT -2D echo showed  EF of 60 to 65%, G1 DD.  EKG showed LBBB, similar to prior EKGs, no acute changes.  Chronic dry cough, likely GERD -SLP evaluation done, no aspiration -  Placed on Protonix 40 mg daily    Benign essential HTN -BP now stable, continue amlodipine, lisinopril    CKD (chronic kidney disease) stage IIIa Presented with creatinine of 1.5, baseline 1.6-1.7 -Creatinine currently 1.4    DM type 2 not on long-term insulin, with diabetic nephropathy CBGs controlled, resume Amaryl outpatient  Hyperlipidemia LDL 106, continue statin  History of prior CVA, May 29, 2015 with mild residual right-sided weakness Continue aspirin, Plavix, statin, no acute issues CT head was negative for any acute stroke  Day of Discharge S: No acute complaints, feels better, back to his baseline, hoping to go home  BP (!) 153/64 (BP Location: Left Arm)   Pulse 74   Temp 98.2 F (36.8 C) (Oral)   Resp 19   Ht 5\' 8"  (1.727 m)   Wt 77.9 kg   SpO2 97%   BMI 26.11 kg/m   Physical Exam: General: Alert and awake oriented x3 not in any acute distress. HEENT: anicteric sclera, pupils reactive to light and accommodation CVS: S1-S2 clear no murmur rubs or gallops Chest: clear to auscultation bilaterally, no wheezing rales or rhonchi Abdomen: soft nontender, nondistended, normal bowel sounds Extremities: no cyanosis, clubbing or edema noted bilaterally Neuro: mild right-sided weakness from prior stroke    Get Medicines reviewed and adjusted: Please take all your medications with you for your next visit with your Primary MD  Please request your Primary MD to go over all hospital tests and procedure/radiological results at the follow up. Please ask your Primary MD to get all Hospital records sent to his/her office.  If you experience worsening of your admission symptoms, develop shortness of breath, life threatening emergency, suicidal or homicidal thoughts you must seek medical attention immediately by calling 911 or  calling your MD immediately  if symptoms less severe.  You must read complete instructions/literature along with all the possible adverse reactions/side effects for all the Medicines you take and that have been prescribed to you. Take any new Medicines after you have completely understood and accept all the possible adverse reactions/side effects.   Do not drive when taking pain medications.   Do not take more than prescribed Pain, Sleep and Anxiety Medications  Special Instructions: If you have smoked or chewed Tobacco  in the last 2 yrs please stop smoking, stop any regular Alcohol  and or any Recreational drug use.  Wear Seat belts while driving.  Please note  You were cared for by a hospitalist during your hospital stay. Once you are discharged, your primary care physician will handle any further medical issues. Please note that NO REFILLS for any discharge medications will be authorized once you are discharged, as it is imperative that you return to your primary care physician (or establish a relationship with a primary care physician if you  do not have one) for your aftercare needs so that they can reassess your need for medications and monitor your lab values.   The results of significant diagnostics from this hospitalization (including imaging, microbiology, ancillary and laboratory) are listed below for reference.      Procedures/Studies:  CT Head Wo Contrast  Result Date: 05/10/2020 CLINICAL DATA:  TIA symptoms EXAM: CT HEAD WITHOUT CONTRAST TECHNIQUE: Contiguous axial images were obtained from the base of the skull through the vertex without intravenous contrast. COMPARISON:  October 15, 2018 FINDINGS: Brain: Moderate diffuse atrophy is stable. There is no intracranial mass, hemorrhage, extra-axial fluid collection, or midline shift. There is decreased attenuation in the centra semiovale bilaterally, stable. There is a focal area of decreased attenuation in the left lower pons,  consistent with a prior small infarct in this area. No acute appearing infarct evident. Vascular: No hyperdense vessel. Foci of calcification noted in each distal vertebral artery and carotid siphon region. Skull: The bony calvarium appears intact. Sinuses/Orbits: Visualized paranasal sinuses are clear. Visualized orbits appear symmetric bilaterally. Other: Mastoid air cells are clear. IMPRESSION: Stable atrophy with periventricular small vessel disease. Prior small infarct inferior left pons. No acute infarct evident. No mass or hemorrhage. There are multiple foci of arterial vascular calcification. Electronically Signed   By: Lowella Grip III M.D.   On: 05/10/2020 14:35   DG Chest Portable 1 View  Result Date: 05/10/2020 CLINICAL DATA:  Syncope EXAM: PORTABLE CHEST 1 VIEW COMPARISON:  10/15/2018 FINDINGS: The heart size and mediastinal contours are within normal limits. Both lungs are clear. The visualized skeletal structures are unremarkable. IMPRESSION: No acute abnormality of the lungs. Electronically Signed   By: Eddie Candle M.D.   On: 05/10/2020 12:34       LAB RESULTS: Basic Metabolic Panel: Recent Labs  Lab 05/10/20 1138 05/11/20 0445  NA 137 137  K 4.3 4.1  CL 108 106  CO2 22 23  GLUCOSE 169* 91  BUN 19 19  CREATININE 1.53* 1.40*  CALCIUM 8.7* 9.0   Liver Function Tests: Recent Labs  Lab 05/10/20 1150  AST 17  ALT 18  ALKPHOS 40  BILITOT 0.9  PROT 6.5  ALBUMIN 3.4*   Recent Labs  Lab 05/10/20 1150  LIPASE 37   No results for input(s): AMMONIA in the last 168 hours. CBC: Recent Labs  Lab 05/10/20 1138 05/11/20 0445  WBC 12.1* 10.2  HGB 15.6 14.5  HCT 48.1 44.2  MCV 93.6 91.1  PLT 178 170   Cardiac Enzymes: No results for input(s): CKTOTAL, CKMB, CKMBINDEX, TROPONINI in the last 168 hours. BNP: Invalid input(s): POCBNP CBG: Recent Labs  Lab 05/11/20 0737 05/11/20 1152  GLUCAP 107* 133*       Disposition and Follow-up: Discharge  Instructions    Diet Carb Modified   Complete by: As directed    Increase activity slowly   Complete by: As directed        DISPOSITION: [facility] in [place]. Call.   Why: take manual script to [facility] for otpt PT. Contact information: [place] [address] [place] [address]       [place], Hal, MD. Schedule an appointment as soon as possible for a visit in 2 week(s).   Specialty: Internal Medicine Contact information: [address] [place] [address] [place] [address] [phone]  Time coordinating discharge:  35 minutes  Signed:   Estill Cotta M.D. [facility] 05/11/2020, 1:56 PM

## 2020-05-11 NOTE — Progress Notes (Signed)
  Speech Language Pathology Treatment: Dysphagia  Patient Details Name: [name] MRN: [id number] DOB: 1927/08/28 Today's Date: 05/11/2020 Time: 4970-2637 SLP Time Calculation (min) (ACUTE ONLY): 25 min  Assessment / Plan / Recommendation Clinical Impression  Pt was seen at bedside during lunch to assess diet tolerance and provide education. Pt was eating lunch, and was observed self feeding pot roast, macaroni and cheese, broccoli, and a cookie. Pt was drinking thin liquids via straw. No obvious oral issues observed or reported, and no overt s/s aspiration on any texture. SLP reviewed safe swallow precautions and esophageal precautions with pt, and posted this information at [facility]. No further ST intervention recommended at this time. Please reconsult if needs arise.   HPI HPI: Patient is a 87 y.o. male with PMH: CAD, CKD, HTN, HLD, PVD, left temporal and left pontine CVA in 2017 (residual right sided weakness, was in CIR), DM-2, who was brought to ED by ambulance after witnessed syncopal versus presyncopal episode this morning while eating breakfast with his son.  He suddenly had become diaphoretic and nauseated, felt light headed, became pale and questionably unresponsive per ED note. He vomited when EMS was present. CT did not reveal any acute intracranial abnormality, CXR unremarkable.  Esophagram in 2017 was WNL and MBS in 2018 was WFL-WNL.      SLP Plan  Discharge SLP treatment due to goals met       Recommendations  Diet recommendations: Regular;Thin liquid Liquids provided via: Cup;Straw Medication Administration: Whole meds with liquid Compensations: Minimize environmental distractions;Slow rate;Small sips/bites Postural Changes and/or Swallow Maneuvers: Seated upright 90 degrees;Upright 30-60 min after meal                Oral Care Recommendations: Oral care BID Follow up Recommendations: None SLP Visit Diagnosis: Dysphagia, unspecified (R13.10) Plan: Discharge SLP  treatment due to (comment)       GO               Amarachukwu Lakatos B. Quentin Ore, [facility], [place] Speech Language Pathologist Office: [phone] Pager: [phone]  Shonna Chock 05/11/2020, 1:39 PM

## 2020-05-11 NOTE — TOC Transition Note (Signed)
Transition of Care [facility]) - CM/SW Discharge Note   Patient Details  Name: Kiefer Opheim MRN: [id number] Date of Birth: 1927-06-18  Transition of Care [facility]) CM/SW Contact:  Dessa Phi, RN Phone Number: 05/11/2020, 1:18 PM   Clinical Narrative: Recc for HHPT-checked-resouts unable to accept:[facility]-spoke to dtr [facility]ney agree to otpt PT-has done it in [facility] in [place]. [place] [phone]. MD updated. No further CM needs            Patient Goals and CMS Choice        Discharge Placement                       Discharge Plan and Services                                     Social Determinants of Health (SDOH) Interventions     Readmission Risk Interventions No flowsheet data found.

## 2020-05-11 NOTE — Care Management Obs Status (Signed)
[name] NOTIFICATION   Patient Details  Name: [name] MRN: [id number] Date of Birth: [birth year]   Medicare Observation Status Notification Given:  Yes    MahabirJuliann Pulse, RN 05/11/2020, 1:43 PM

## 2020-05-11 NOTE — Evaluation (Signed)
Occupational Therapy Evaluation Patient Details Name: [name] MRN: [id number] DOB: October 25, 1927 Today's Date: 05/11/2020    History of Present Illness This is a 87 year old male with past medical history of CAD, CKD, hypertension, hyperlipidemia, PVD, Mallory-Weiss tear, left temporal and left pontine stroke in 2017 with residual right-sided weakness with a loop recorder in place, SVT, type 2 diabetes who was brought in by ambulance a witnessed syncopal versus presyncopal episode this morning while eating breakfast with his son   Clinical Impression   Patient is currently requiring assistance with ADLs including min guard to minimal assist with toileting, with LE dressing, and with bathing, and supervision with all other ADLs, all of which may be below patient's typical baseline at home, but n family present to confirm, although daughter on phone endorses baseline cognitive impairment.   During this evaluation, patient was limited by generalized weakness and baseline RT sided weakness from 2017 CVA, which has the potential to impact patient's safety and independence during functional mobility, as well as performance for ADLs. [place] "6-clicks" Daily Activity Inpatient Short Form score of 19/24 indicates 42.80% ADL impairment this session. Patient lives with his daughter and SIL, who are able to provide 24/7 supervision and assistance.  Patient demonstrates good rehab potential, and should benefit from continued skilled occupational therapy services while in acute care to maximize safety, independence and quality of life at home.  No post-acute OT needs identified. ?     Follow Up Recommendations    Home with family.    Equipment Recommendations    NA   Recommendations for Other Services       Precautions / Restrictions Precautions Precautions: Fall Precaution Comments: has Loop recorder Restrictions Weight Bearing Restrictions: No      Mobility Bed Mobility Overal  bed mobility: Needs Assistance Bed Mobility: Supine to Sit     Supine to sit: Supervision     General bed mobility comments: for safety, extra time to right trunk-may be baseline Patient Response: Cooperative  Transfers Overall transfer level: Needs assistance Equipment used: Rolling walker (2 wheeled) Transfers: Sit to/from Stand Sit to Stand: Min guard         General transfer comment: cues for hand placement to stand and sit down.    Balance Overall balance assessment: Needs assistance Sitting-balance support: No upper extremity supported;Feet supported Sitting balance-Leahy Scale: Fair     Standing balance support: During functional activity;Bilateral upper extremity supported Standing balance-Leahy Scale: Fair                             ADL either performed or assessed with clinical judgement   ADL Overall ADL's : Needs assistance/impaired Eating/Feeding: Set up;Sitting   Grooming: Set up;Supervision/safety;Sitting   Upper Body Bathing: Sitting;Supervision/ safety;Set up;Cueing for sequencing   Lower Body Bathing: Min guard;Cueing for sequencing   Upper Body Dressing : Set up;Supervision/safety;Sitting;Cueing for sequencing   Lower Body Dressing: Sit to/from stand;Min guard;Cueing for sequencing   Toilet Transfer: Supervision/safety;RW;Cueing for sequencing   Toileting- Clothing Manipulation and Hygiene: Min guard;Cueing for sequencing   Tub/ Shower Transfer: Min guard;Rolling walker   Functional mobility during ADLs: Supervision/safety;Rolling walker       Vision   Vision Assessment?: No apparent visual deficits     Perception     Praxis      Pertinent Vitals/Pain Pain Assessment: No/denies pain     Hand Dominance Right   Extremity/Trunk Assessment Upper Extremity Assessment Upper  Extremity Assessment: RUE deficits/detail;Generalized weakness RUE Deficits / Details: Generalized weakness from CVA, but using RUE during  functional mobility as able. RUE Coordination: decreased fine motor   Lower Extremity Assessment Lower Extremity Assessment: Defer to PT evaluation RLE Deficits / Details: mildly decreased speed of movemnt.   Cervical / Trunk Assessment Cervical / Trunk Assessment: Normal   Communication Communication Communication: Expressive difficulties   Cognition Arousal/Alertness: Awake/alert Behavior During Therapy: WFL for tasks assessed/performed Overall Cognitive Status: History of cognitive impairments - at baseline                                 General Comments: slow to answer, Oriented  to self, hospital, why he is here   General Comments       Exercises     Shoulder Instructions      Home Living Family/patient expects to be discharged to:: Private residence Living Arrangements: Children Available Help at Discharge: Family;Available 24 hours/day Type of Home: House Home Access: Stairs to enter [employer] of Steps: 3 Entrance Stairs-Rails: Left Home Layout: One level     Bathroom Shower/Tub: Occupational psychologist: Standard Bathroom Accessibility: Yes   Home Equipment: Cane - single point;Walker - 2 wheels   Additional Comments: lives with daughter and son in [place].  Pt stated he is unaware whether he has a shower seat, but confirmed that he sits down to shower.      Prior Functioning/Environment Level of Independence: Needs assistance  Gait / Transfers Assistance Needed: amb withy a cane ADL's / Homemaking Assistance Needed: sponge bathes; per daughter on phone, pt with h/o ognitive impairment since CVA in 2017, and requires supervision and light assistance as needed.  Daughter and SIL perform IADLs.   Comments: Listens to music        OT Problem List: Decreased strength;Decreased coordination;Cardiopulmonary status limiting activity;Decreased activity tolerance;Decreased safety awareness;Impaired balance (sitting and/or  standing);Decreased knowledge of use of DME or AE;Impaired UE functional use;Decreased knowledge of precautions      OT Treatment/Interventions: Self-care/ADL training;Therapeutic activities;Therapeutic exercise;Neuromuscular education;Cognitive remediation/compensation;Energy conservation;DME and/or AE instruction;Patient/family education;Balance training    OT Goals(Current goals can be found in the care plan section) Acute Rehab OT Goals Patient Stated Goal: go home OT Goal Formulation: With patient ADL Goals Pt Will Perform Lower Body Dressing: with supervision Pt Will Transfer to Toilet: with supervision Pt Will Perform Toileting - Clothing Manipulation and hygiene: with supervision Additional ADL Goal #1: Pt will engage in 8 min standing functional activities without loss of balance, in order to demonstrate improved activity tolerance and balance needed to perform ADLs safely at home. Additional ADL Goal #2: Pt will stand at sink and perform 3/3 grooming tasks with supervision in order to demonstrate standing balance and tolerance needed to return home with increased safety.  OT Frequency: Min 2X/week   Barriers to D/C:    No known       Co-evaluation PT/OT/SLP Co-Evaluation/Treatment: Yes Reason for Co-Treatment: For patient/therapist safety PT goals addressed during session: Mobility/safety with mobility OT goals addressed during session: ADL's and self-care      AM-PAC OT "6 Clicks" Daily Activity     Outcome Measure Help from another person eating meals?: None Help from another person taking care of personal grooming?: A Little Help from another person toileting, which includes using toliet, bedpan, or urinal?: A Little Help from another person bathing (including washing, rinsing, drying)?: A  Little Help from another person to put on and taking off regular upper body clothing?: A Little Help from another person to put on and taking off regular lower body clothing?: A  Little 6 Click Score: 19   End of Session Equipment Utilized During Treatment: Gait belt;Rolling walker Nurse Communication: Mobility status  Activity Tolerance: Patient tolerated treatment well Patient left: in chair;with call bell/phone within reach;with chair alarm set  OT Visit Diagnosis: Other symptoms and signs involving cognitive function;Muscle weakness (generalized) (M62.81);Unsteadiness on feet (R26.81);Hemiplegia and hemiparesis Hemiplegia - Right/Left: Right Hemiplegia - dominant/non-dominant: Dominant Hemiplegia - caused by: Cerebral infarction (2017)                Time: 1027-2536 OT Time Calculation (min): 22 min Charges:  OT General Charges $OT Visit: 1 Visit OT Evaluation $OT Eval Low Complexity: [place], [place] Office: [phone] 05/11/2020  Julien Girt 05/11/2020, 11:06 AM

## 2020-05-11 NOTE — Progress Notes (Signed)
  Echocardiogram 2D Echocardiogram has been performed.  [name] 05/11/2020, 11:42 AM

## 2020-05-20 DIAGNOSIS — E7800 Pure hypercholesterolemia, unspecified: Secondary | ICD-10-CM | POA: Diagnosis not present

## 2020-05-20 DIAGNOSIS — I129 Hypertensive chronic kidney disease with stage 1 through stage 4 chronic kidney disease, or unspecified chronic kidney disease: Secondary | ICD-10-CM | POA: Diagnosis not present

## 2020-05-20 DIAGNOSIS — E1151 Type 2 diabetes mellitus with diabetic peripheral angiopathy without gangrene: Secondary | ICD-10-CM | POA: Diagnosis not present

## 2020-05-20 DIAGNOSIS — E1122 Type 2 diabetes mellitus with diabetic chronic kidney disease: Secondary | ICD-10-CM | POA: Diagnosis not present

## 2020-05-20 DIAGNOSIS — N1832 Chronic kidney disease, stage 3b: Secondary | ICD-10-CM | POA: Diagnosis not present

## 2020-05-25 ENCOUNTER — Ambulatory Visit: Payer: Medicare Other | Admitting: Internal Medicine

## 2020-05-27 DIAGNOSIS — R55 Syncope and collapse: Secondary | ICD-10-CM | POA: Diagnosis not present

## 2020-07-20 DIAGNOSIS — Z79899 Other long term (current) drug therapy: Secondary | ICD-10-CM | POA: Diagnosis not present

## 2020-07-20 DIAGNOSIS — M109 Gout, unspecified: Secondary | ICD-10-CM | POA: Diagnosis not present

## 2020-07-20 DIAGNOSIS — N1832 Chronic kidney disease, stage 3b: Secondary | ICD-10-CM | POA: Diagnosis not present

## 2020-07-20 DIAGNOSIS — E1122 Type 2 diabetes mellitus with diabetic chronic kidney disease: Secondary | ICD-10-CM | POA: Diagnosis not present

## 2020-07-20 DIAGNOSIS — E1151 Type 2 diabetes mellitus with diabetic peripheral angiopathy without gangrene: Secondary | ICD-10-CM | POA: Diagnosis not present

## 2020-07-20 DIAGNOSIS — I129 Hypertensive chronic kidney disease with stage 1 through stage 4 chronic kidney disease, or unspecified chronic kidney disease: Secondary | ICD-10-CM | POA: Diagnosis not present

## 2020-07-23 DIAGNOSIS — C44329 Squamous cell carcinoma of skin of other parts of face: Secondary | ICD-10-CM | POA: Diagnosis not present

## 2020-08-16 ENCOUNTER — Emergency Department (HOSPITAL_COMMUNITY): Payer: Medicare Other

## 2020-08-16 ENCOUNTER — Emergency Department (HOSPITAL_COMMUNITY)
Admission: EM | Admit: 2020-08-16 | Discharge: 2020-08-16 | Disposition: A | Discharge status: Home / Self Care | Payer: Medicare Other | Attending: Emergency Medicine | Admitting: Emergency Medicine

## 2020-08-16 ENCOUNTER — Encounter (HOSPITAL_COMMUNITY): Payer: Self-pay | Admitting: Emergency Medicine

## 2020-08-16 ENCOUNTER — Other Ambulatory Visit: Payer: Self-pay

## 2020-08-16 DIAGNOSIS — I10 Essential (primary) hypertension: Secondary | ICD-10-CM | POA: Diagnosis not present

## 2020-08-16 DIAGNOSIS — Z79899 Other long term (current) drug therapy: Secondary | ICD-10-CM | POA: Insufficient documentation

## 2020-08-16 DIAGNOSIS — M25561 Pain in right knee: Secondary | ICD-10-CM

## 2020-08-16 DIAGNOSIS — M25562 Pain in left knee: Secondary | ICD-10-CM | POA: Diagnosis not present

## 2020-08-16 DIAGNOSIS — Z743 Need for continuous supervision: Secondary | ICD-10-CM | POA: Diagnosis not present

## 2020-08-16 DIAGNOSIS — G8929 Other chronic pain: Secondary | ICD-10-CM | POA: Insufficient documentation

## 2020-08-16 DIAGNOSIS — N189 Chronic kidney disease, unspecified: Secondary | ICD-10-CM | POA: Diagnosis not present

## 2020-08-16 DIAGNOSIS — R195 Other fecal abnormalities: Secondary | ICD-10-CM | POA: Diagnosis not present

## 2020-08-16 DIAGNOSIS — I2510 Atherosclerotic heart disease of native coronary artery without angina pectoris: Secondary | ICD-10-CM | POA: Insufficient documentation

## 2020-08-16 DIAGNOSIS — I1310 Hypertensive heart and chronic kidney disease without heart failure, with stage 1 through stage 4 chronic kidney disease, or unspecified chronic kidney disease: Secondary | ICD-10-CM | POA: Diagnosis not present

## 2020-08-16 DIAGNOSIS — E1122 Type 2 diabetes mellitus with diabetic chronic kidney disease: Secondary | ICD-10-CM | POA: Insufficient documentation

## 2020-08-16 DIAGNOSIS — R531 Weakness: Secondary | ICD-10-CM | POA: Diagnosis not present

## 2020-08-16 DIAGNOSIS — Z7902 Long term (current) use of antithrombotics/antiplatelets: Secondary | ICD-10-CM | POA: Diagnosis not present

## 2020-08-16 DIAGNOSIS — R6889 Other general symptoms and signs: Secondary | ICD-10-CM | POA: Diagnosis not present

## 2020-08-16 LAB — COMPREHENSIVE METABOLIC PANEL
ALT: 18 U/L (ref 0–44)
AST: 17 U/L (ref 15–41)
Albumin: 3.8 g/dL (ref 3.5–5.0)
Alkaline Phosphatase: 44 U/L (ref 38–126)
Anion gap: 7 (ref 5–15)
BUN: 21 mg/dL (ref 8–23)
CO2: 27 mmol/L (ref 22–32)
Calcium: 9.4 mg/dL (ref 8.9–10.3)
Chloride: 103 mmol/L (ref 98–111)
Creatinine, Ser: 1.22 mg/dL (ref 0.61–1.24)
GFR, Estimated: 56 mL/min — ABNORMAL LOW (ref >60)
Glucose, Bld: 112 mg/dL — ABNORMAL HIGH (ref 70–99)
Potassium: 4.1 mmol/L (ref 3.5–5.1)
Sodium: 137 mmol/L (ref 135–145)
Total Bilirubin: 0.9 mg/dL (ref 0.3–1.2)
Total Protein: 7.2 g/dL (ref 6.5–8.1)

## 2020-08-16 LAB — CBC WITH DIFFERENTIAL/PLATELET
Abs Immature Granulocytes: 0.08 10*3/uL — ABNORMAL HIGH (ref 0.00–0.07)
Basophils Absolute: 0 10*3/uL (ref 0.0–0.1)
Basophils Relative: 0 %
Eosinophils Absolute: 0.1 10*3/uL (ref 0.0–0.5)
Eosinophils Relative: 1 %
HCT: 52 % (ref 39.0–52.0)
Hemoglobin: 16.9 g/dL (ref 13.0–17.0)
Immature Granulocytes: 1 %
Lymphocytes Relative: 13 %
Lymphs Abs: 1.9 10*3/uL (ref 0.7–4.0)
MCH: 29.9 pg (ref 26.0–34.0)
MCHC: 32.5 g/dL (ref 30.0–36.0)
MCV: 91.9 fL (ref 80.0–100.0)
Monocytes Absolute: 1.5 10*3/uL — ABNORMAL HIGH (ref 0.1–1.0)
Monocytes Relative: 11 %
Neutro Abs: 10.4 10*3/uL — ABNORMAL HIGH (ref 1.7–7.7)
Neutrophils Relative %: 74 %
Platelets: 182 10*3/uL (ref 150–400)
RBC: 5.66 MIL/uL (ref 4.22–5.81)
RDW: 14.6 % (ref 11.5–15.5)
WBC: 14 10*3/uL — ABNORMAL HIGH (ref 4.0–10.5)
nRBC: 0 % (ref 0.0–0.2)

## 2020-08-16 LAB — TYPE AND SCREEN
ABO/RH(D): A POS
Antibody Screen: NEGATIVE

## 2020-08-16 LAB — POC OCCULT BLOOD, ED: Fecal Occult Bld: NEGATIVE

## 2020-08-16 MED ORDER — TRAMADOL HCL 50 MG PO TABS: 50.0000 mg | ORAL_TABLET | Freq: Three times a day (TID) | ORAL | 0 refills | Status: DC | PRN

## 2020-08-16 MED ORDER — AMLODIPINE BESYLATE 5 MG PO TABS
10.0000 mg | ORAL_TABLET | Freq: Once | ORAL | Status: DC
  Filled 2020-08-16: qty 2

## 2020-08-16 MED ORDER — PREDNISONE 20 MG PO TABS: 40.0000 mg | ORAL_TABLET | Freq: Every day | ORAL | 0 refills | Status: AC

## 2020-08-16 MED ORDER — PREDNISONE 20 MG PO TABS
40.0000 mg | ORAL_TABLET | Freq: Once | ORAL | Status: AC
  Administered 2020-08-16: 40 mg via ORAL
  Filled 2020-08-16: qty 2

## 2020-08-16 MED ORDER — LISINOPRIL 20 MG PO TABS
30.0000 mg | ORAL_TABLET | Freq: Once | ORAL | Status: DC
  Filled 2020-08-16: qty 1

## 2020-08-16 NOTE — ED Provider Notes (Signed)
[facility] DEPT Provider Note   CSN: [id number] Arrival date & time: 08/16/20  0857     History Chief Complaint  Patient presents with   Knee Pain   Dark Stools    [name] is a 87 y.o. male.  Patient comes in with acute on chronic knee pain bilaterally.  Recently treated for gout in the right knee.  States that both knees still hurt but may be there is some worse pain in the left knee today.  Has also noticed some dark stools over the last week.  Appears that he does have a walker and wheelchair at home.  Pain was out of proportion today which brought him in.  The history is provided by the patient.  Knee Pain Lower extremity pain location: both knees, left worse than right. Pain details:    Quality:  Aching and dull   Severity:  Moderate   Onset quality:  Gradual   Timing:  Constant   Progression:  Unchanged Chronicity:  Recurrent Associated symptoms: stiffness and swelling   Associated symptoms: no back pain, no decreased ROM, no fatigue, no fever, no itching and no muscle weakness       Past Medical History:  Diagnosis Date   Adenomatous polyp    CAD (coronary artery disease)    Chronic kidney disease    Diverticulosis    DVT (deep venous thrombosis) (HCC)    Gout    History of pulmonary embolus (PE)    from a DVT in 2008   Hyperglycemia    Hyperlipidemia    Hypertension    Mallory-Weiss tear    Peripheral vascular disease ([place])    Stroke ([place]) 09/2015   SVT (supraventricular tachycardia) (HCC)    Vitamin B12 deficiency     Patient Active Problem List   Diagnosis Date Noted   Syncope 05/10/2020   Gait disturbance, post-stroke 11/23/2015   Hemiparesis affecting right side as late effect of cerebrovascular accident (CVA) ([place]) 11/23/2015   Acute blood loss anemia    Acute lower UTI    Right elbow pain    Nausea without vomiting    Urinary retention    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  ([place])    Urinary hesitancy    Right sided weakness    Acute ischemic left MCA stroke (HCC)    Hematuria    Bleeding    Leukocytosis    Depression    Steroid-induced hyperglycemia    Left pontine CVA ([place]) 10/21/2015   Dysarthria, post-stroke    Right hemiparesis (HCC)    Sinus arrest    CKD (chronic kidney disease)    DM type 2 without retinopathy ([place])    Coronary artery disease involving native coronary artery of native heart without angina pectoris    Acute idiopathic gout of right hand    Aphasia    Dysphagia, post-stroke    HLD (hyperlipidemia)    Benign essential HTN    Orthostatic hypotension    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   Cerebral infarction ([place]) 10/14/2015   Accelerated hypertension 10/14/2015   Type 2 diabetes mellitus with circulatory disorder ([place]) 10/14/2015   Peripheral vascular disease, unspecified ([place]) 06/04/2013    Past Surgical History:  Procedure Laterality Date    PTCA of coronary lesion Right 1995   APPENDECTOMY     EP IMPLANTABLE DEVICE N/A 12/01/2015   Procedure: Loop Recorder Insertion;  Surgeon: Evans Lance, MD;  Location: [facility];  Service: Cardiovascular;  Laterality: N/A;   PR VEIN BYPASS GRAFT,AORTO-FEM-POP Left    femoral-popliteal BP by Dr. Drucie Opitz   repair of aal fissure   1978       Family History  Problem Relation Age of Onset   Cancer Mother        bladder   CAD Father    Heart attack Father     Social History   Tobacco Use   Smoking status: Never   Smokeless tobacco: Never  Substance Use Topics   Alcohol use: No   Drug use: No    Home Medications Prior to Admission medications   Medication Sig Start Date End Date Taking? Authorizing Provider  predniSONE (DELTASONE) 20 MG tablet Take 2 tablets (40 mg total) by mouth daily for 5 days. 08/16/20 08/21/20 Yes Leontine Radman, DO  traMADol (ULTRAM) 50 MG tablet Take 1 tablet (50 mg total) by mouth  every 8 (eight) hours as needed for up to 15 doses. 08/16/20  Yes Ailee Pates, DO  amLODipine (NORVASC) 10 MG tablet Take 1 tablet (10 mg total) by mouth daily. 11/12/15   Angiulli, Lavon Paganini, PA-C  aspirin 325 MG tablet Take 1 tablet (325 mg total) by mouth daily. 10/21/15   Patrecia Pour, MD  clopidogrel (PLAVIX) 75 MG tablet Take 1 tablet (75 mg total) by mouth daily. 11/11/15   Angiulli, Lavon Paganini, PA-C  famotidine (PEPCID) 20 MG tablet Take 20 mg by mouth 2 (two) times daily. 05/21/20   [provider]  glimepiride (AMARYL) 2 MG tablet Take 1 tablet (2 mg total) by mouth daily with breakfast. 11/12/15   Angiulli, Lavon Paganini, PA-C  lisinopril (ZESTRIL) 30 MG tablet Take 1 tablet (30 mg total) by mouth daily. 05/12/20   Rai, Vernelle Emerald, MD  ondansetron (ZOFRAN ODT) 4 MG disintegrating tablet Take 1 tablet (4 mg total) by mouth every 8 (eight) hours as needed for nausea or vomiting. 05/11/20   Rai, Ripudeep K, MD  pantoprazole (PROTONIX) 40 MG tablet Take 1 tablet (40 mg total) by mouth daily. 05/12/20   Rai, Ripudeep K, MD  rosuvastatin (CRESTOR) 5 MG tablet Take 5 mg by mouth 2 (two) times a week. Monday and Friday 02/22/20   [provider]  UNABLE TO FIND Outpatient physical therapy  Diagnosis: Prior history of CVA, generalized debility 05/11/20   Rai, Vernelle Emerald, MD    Allergies    Cephalexin, Glimepiride, Hydralazine, Lipitor [atorvastatin], and Zetia [ezetimibe]  Review of Systems   Review of Systems  Constitutional:  Negative for chills, fatigue and fever.  HENT:  Negative for ear pain and sore throat.   Eyes:  Negative for pain and visual disturbance.  Respiratory:  Negative for cough and shortness of breath.   Cardiovascular:  Negative for chest pain and palpitations.  Gastrointestinal:  Positive for blood in stool. Negative for abdominal pain and vomiting.  Genitourinary:  Negative for dysuria and hematuria.  Musculoskeletal:  Positive for arthralgias, gait problem,  joint swelling and stiffness. Negative for back pain.  Skin:  Negative for color change, itching and rash.  Neurological:  Negative for seizures and syncope.  All other systems reviewed and are negative.  Physical Exam Updated Vital Signs BP (!) 155/61   Pulse 80   Temp 99 F (37.2 C) (Oral)   Resp 15   Ht 5\' 8"  (1.727 m)   Wt 78 kg   SpO2 94%   BMI 26.15 kg/m  Physical Exam Vitals and nursing note reviewed.  Constitutional:      General: He is not in acute distress.    Appearance: He is well-developed. He is not ill-appearing.  HENT:     Head: Normocephalic and atraumatic.  Eyes:     Extraocular Movements: Extraocular movements intact.     Conjunctiva/sclera: Conjunctivae normal.     Pupils: Pupils are equal, round, and reactive to light.  Cardiovascular:     Rate and Rhythm: Normal rate and regular rhythm.     Heart sounds: No murmur heard. Pulmonary:     Effort: Pulmonary effort is normal. No respiratory distress.     Breath sounds: Normal breath sounds.  Abdominal:     Palpations: Abdomen is soft.     Tenderness: There is no abdominal tenderness.  Genitourinary:    Rectum: Guaiac result negative.  Musculoskeletal:        General: Swelling and tenderness present. Normal range of motion.     Cervical back: Neck supple.     Comments: There is some trace swelling to the left knee when compared to the right but there is no issues with range of motion of the knees with some minimal pain  Skin:    General: Skin is warm and dry.     Capillary Refill: Capillary refill takes less than 2 seconds.  Neurological:     General: No focal deficit present.     Mental Status: He is alert and oriented to person, place, and time.     Cranial Nerves: No cranial nerve deficit.     Sensory: No sensory deficit.     Motor: No weakness.     Coordination: Coordination normal.     Comments: 5+ out of 5 strength throughout, normal sensation    ED Results / Procedures / Treatments    Labs (all labs ordered are listed, but only abnormal results are displayed) Labs Reviewed  CBC WITH DIFFERENTIAL/PLATELET - Abnormal; Notable for the following components:      Result Value   WBC 14.0 (*)    Neutro Abs 10.4 (*)    Monocytes Absolute 1.5 (*)    Abs Immature Granulocytes 0.08 (*)    All other components within normal limits  COMPREHENSIVE METABOLIC PANEL - Abnormal; Notable for the following components:   Glucose, Bld 112 (*)    GFR, Estimated 56 (*)    All other components within normal limits  POC OCCULT BLOOD, ED  TYPE AND SCREEN    EKG EKG Interpretation  Date/Time:  Sunday August 16 2020 10:07:38 EDT Ventricular Rate:  82 PR Interval:  205 QRS Duration: 118 QT Interval:  372 QTC Calculation: 435 R Axis:   -49 Text Interpretation: Sinus rhythm LAD, consider left anterior fascicular block LVH with secondary repolarization abnormality Confirmed by Lennice Sites ([id number]) on 08/16/2020 10:11:02 AM  Radiology DG Knee Complete 4 Views Left  Result Date: 08/16/2020 CLINICAL DATA:  Left knee pain.  No known injury. EXAM: LEFT KNEE - COMPLETE 4+ VIEW COMPARISON:  None. FINDINGS: No evidence of fracture or dislocation. Small knee joint effusion is noted. No evidence of arthropathy or other osseous abnormality. Peripheral vascular calcification noted. IMPRESSION: Small knee joint effusion. No osseous abnormality. Peripheral vascular calcification. Electronically Signed   By: Marlaine Hind M.D.   On: 08/16/2020 10:33   DG Knee Complete 4 Views Right  Result Date: 08/16/2020 CLINICAL DATA:  Right knee pain.  No known injury. EXAM: RIGHT KNEE - COMPLETE 4+ VIEW COMPARISON:  None. FINDINGS: No evidence of fracture, dislocation, or joint effusion. No evidence of arthropathy or other focal bone abnormality. Extensive peripheral vascular calcification noted. IMPRESSION: No acute findings. Extensive peripheral vascular calcification. Electronically Signed   By: Marlaine Hind M.D.    On: 08/16/2020 10:33    Procedures Procedures   Medications Ordered in ED Medications  predniSONE (DELTASONE) tablet 40 mg (40 mg Oral Given 08/16/20 1118)    ED Course  I have reviewed the triage vital signs and the nursing notes.  Pertinent labs & imaging results that were available during my care of the patient were reviewed by me and considered in my medical decision making (see chart for details).    MDM Rules/Calculators/A&P                          [name] is here with bilateral knee pain and dark stools.  Normal vitals.  No fever.  History of gout.  No history of GI bleed.  Hemoccult is negative.  Brown stool on exam.  No concern for GI bleed.  Hemoglobin is normal at 16.9.  Lab work otherwise is unremarkable.  He was recently treated for gout of his right knee.  Still having pain in bilateral knees worse this morning.  He has no real evidence of gout on exam does have little bit of trace swelling on the left knee.  Overall suspect inflammatory arthritis.  X-ray showed no acute fracture.  Did show mild joint swelling in the left knee.  No concern for septic joint.  Will prescribe prednisone and tramadol have him follow-up with primary care doctor.  He has a wheelchair and walker at home.  He is neurovascular neuromuscularly intact.  Overall suspect acute on chronic knee pain.  Discharged in good condition.  This chart was dictated using voice recognition software.  Despite best efforts to proofread,  errors can occur which can change the documentation meaning.   Final Clinical Impression(s) / ED Diagnoses Final diagnoses:  Acute pain of both knees    Rx / DC Orders ED Discharge Orders          Ordered    traMADol (ULTRAM) 50 MG tablet  Every 8 hours PRN        08/16/20 1118    predniSONE (DELTASONE) 20 MG tablet  Daily        08/16/20 1118             Aniylah Avans, DO 08/16/20 1119

## 2020-08-16 NOTE — Discharge Instructions (Addendum)
Take tramadol as prescribed.  Take 1000 mg of Tylenol every 6 hours as needed for pain as well.  Recommend ice, rest, using wheelchair and walker at all times.

## 2020-08-16 NOTE — ED Triage Notes (Signed)
BIB EMS from home, C/C bilateral knee pain for 'a long time,' denies trauma or falls, ambulated to RR this AM and called EMS d/t pain.

## 2020-08-27 DIAGNOSIS — E7800 Pure hypercholesterolemia, unspecified: Secondary | ICD-10-CM | POA: Diagnosis not present

## 2020-08-27 DIAGNOSIS — I129 Hypertensive chronic kidney disease with stage 1 through stage 4 chronic kidney disease, or unspecified chronic kidney disease: Secondary | ICD-10-CM | POA: Diagnosis not present

## 2020-08-27 DIAGNOSIS — E1122 Type 2 diabetes mellitus with diabetic chronic kidney disease: Secondary | ICD-10-CM | POA: Diagnosis not present

## 2020-08-27 DIAGNOSIS — E1151 Type 2 diabetes mellitus with diabetic peripheral angiopathy without gangrene: Secondary | ICD-10-CM | POA: Diagnosis not present

## 2020-08-27 DIAGNOSIS — N1832 Chronic kidney disease, stage 3b: Secondary | ICD-10-CM | POA: Diagnosis not present

## 2020-10-15 DIAGNOSIS — M10429 Other secondary gout, unspecified elbow: Secondary | ICD-10-CM | POA: Diagnosis not present

## 2020-10-15 DIAGNOSIS — E1122 Type 2 diabetes mellitus with diabetic chronic kidney disease: Secondary | ICD-10-CM | POA: Diagnosis not present

## 2020-10-15 DIAGNOSIS — N183 Chronic kidney disease, stage 3 unspecified: Secondary | ICD-10-CM | POA: Diagnosis not present

## 2020-10-15 DIAGNOSIS — Z7984 Long term (current) use of oral hypoglycemic drugs: Secondary | ICD-10-CM | POA: Diagnosis not present

## 2020-10-15 DIAGNOSIS — N1832 Chronic kidney disease, stage 3b: Secondary | ICD-10-CM | POA: Diagnosis not present

## 2020-10-15 DIAGNOSIS — M25571 Pain in right ankle and joints of right foot: Secondary | ICD-10-CM | POA: Diagnosis not present

## 2020-10-18 ENCOUNTER — Inpatient Hospital Stay (HOSPITAL_COMMUNITY)
Admission: EM | Admit: 2020-10-18 | Discharge: 2020-10-21 | DRG: 177 | Disposition: A | Discharge status: Home / Self Care | Payer: Medicare Other | Attending: Internal Medicine | Admitting: Internal Medicine

## 2020-10-18 ENCOUNTER — Encounter (HOSPITAL_COMMUNITY): Payer: Self-pay

## 2020-10-18 ENCOUNTER — Other Ambulatory Visit: Payer: Self-pay

## 2020-10-18 ENCOUNTER — Emergency Department (HOSPITAL_COMMUNITY): Payer: Medicare Other

## 2020-10-18 DIAGNOSIS — E1159 Type 2 diabetes mellitus with other circulatory complications: Secondary | ICD-10-CM | POA: Diagnosis not present

## 2020-10-18 DIAGNOSIS — J9601 Acute respiratory failure with hypoxia: Secondary | ICD-10-CM | POA: Diagnosis not present

## 2020-10-18 DIAGNOSIS — I10 Essential (primary) hypertension: Secondary | ICD-10-CM | POA: Diagnosis present

## 2020-10-18 DIAGNOSIS — Z79899 Other long term (current) drug therapy: Secondary | ICD-10-CM

## 2020-10-18 DIAGNOSIS — Z7984 Long term (current) use of oral hypoglycemic drugs: Secondary | ICD-10-CM | POA: Diagnosis not present

## 2020-10-18 DIAGNOSIS — Z743 Need for continuous supervision: Secondary | ICD-10-CM | POA: Diagnosis not present

## 2020-10-18 DIAGNOSIS — I130 Hypertensive heart and chronic kidney disease with heart failure and stage 1 through stage 4 chronic kidney disease, or unspecified chronic kidney disease: Secondary | ICD-10-CM | POA: Diagnosis present

## 2020-10-18 DIAGNOSIS — E119 Type 2 diabetes mellitus without complications: Secondary | ICD-10-CM

## 2020-10-18 DIAGNOSIS — Z888 Allergy status to other drugs, medicaments and biological substances status: Secondary | ICD-10-CM | POA: Diagnosis not present

## 2020-10-18 DIAGNOSIS — N1831 Chronic kidney disease, stage 3a: Secondary | ICD-10-CM

## 2020-10-18 DIAGNOSIS — R0602 Shortness of breath: Secondary | ICD-10-CM | POA: Diagnosis not present

## 2020-10-18 DIAGNOSIS — Z28310 Unvaccinated for covid-19: Secondary | ICD-10-CM

## 2020-10-18 DIAGNOSIS — N1832 Chronic kidney disease, stage 3b: Secondary | ICD-10-CM | POA: Diagnosis present

## 2020-10-18 DIAGNOSIS — Z881 Allergy status to other antibiotic agents status: Secondary | ICD-10-CM

## 2020-10-18 DIAGNOSIS — I5032 Chronic diastolic (congestive) heart failure: Secondary | ICD-10-CM | POA: Diagnosis not present

## 2020-10-18 DIAGNOSIS — R059 Cough, unspecified: Secondary | ICD-10-CM | POA: Diagnosis not present

## 2020-10-18 DIAGNOSIS — R531 Weakness: Secondary | ICD-10-CM

## 2020-10-18 DIAGNOSIS — Z7902 Long term (current) use of antithrombotics/antiplatelets: Secondary | ICD-10-CM | POA: Diagnosis not present

## 2020-10-18 DIAGNOSIS — Z66 Do not resuscitate: Secondary | ICD-10-CM | POA: Diagnosis not present

## 2020-10-18 DIAGNOSIS — U071 COVID-19: Principal | ICD-10-CM | POA: Diagnosis present

## 2020-10-18 DIAGNOSIS — N179 Acute kidney failure, unspecified: Secondary | ICD-10-CM | POA: Diagnosis not present

## 2020-10-18 DIAGNOSIS — Z7982 Long term (current) use of aspirin: Secondary | ICD-10-CM

## 2020-10-18 DIAGNOSIS — R0902 Hypoxemia: Secondary | ICD-10-CM | POA: Diagnosis not present

## 2020-10-18 DIAGNOSIS — I2510 Atherosclerotic heart disease of native coronary artery without angina pectoris: Secondary | ICD-10-CM | POA: Diagnosis not present

## 2020-10-18 DIAGNOSIS — Z8249 Family history of ischemic heart disease and other diseases of the circulatory system: Secondary | ICD-10-CM

## 2020-10-18 DIAGNOSIS — Z86718 Personal history of other venous thrombosis and embolism: Secondary | ICD-10-CM | POA: Diagnosis not present

## 2020-10-18 DIAGNOSIS — E1151 Type 2 diabetes mellitus with diabetic peripheral angiopathy without gangrene: Secondary | ICD-10-CM | POA: Diagnosis present

## 2020-10-18 DIAGNOSIS — I69351 Hemiplegia and hemiparesis following cerebral infarction affecting right dominant side: Secondary | ICD-10-CM | POA: Diagnosis not present

## 2020-10-18 DIAGNOSIS — E1122 Type 2 diabetes mellitus with diabetic chronic kidney disease: Secondary | ICD-10-CM | POA: Diagnosis not present

## 2020-10-18 DIAGNOSIS — N189 Chronic kidney disease, unspecified: Secondary | ICD-10-CM | POA: Diagnosis present

## 2020-10-18 DIAGNOSIS — N183 Chronic kidney disease, stage 3 unspecified: Secondary | ICD-10-CM | POA: Diagnosis present

## 2020-10-18 DIAGNOSIS — E785 Hyperlipidemia, unspecified: Secondary | ICD-10-CM | POA: Diagnosis not present

## 2020-10-18 DIAGNOSIS — M109 Gout, unspecified: Secondary | ICD-10-CM | POA: Diagnosis not present

## 2020-10-18 DIAGNOSIS — R6889 Other general symptoms and signs: Secondary | ICD-10-CM | POA: Diagnosis not present

## 2020-10-18 LAB — URINALYSIS, ROUTINE W REFLEX MICROSCOPIC
Bacteria, UA: NONE SEEN
Bilirubin Urine: NEGATIVE
Glucose, UA: NEGATIVE mg/dL
Ketones, ur: NEGATIVE mg/dL
Leukocytes,Ua: NEGATIVE
Nitrite: NEGATIVE
Protein, ur: NEGATIVE mg/dL
Specific Gravity, Urine: 1.013 (ref 1.005–1.030)
pH: 5 (ref 5.0–8.0)

## 2020-10-18 LAB — CBC
HCT: 44 % (ref 39.0–52.0)
Hemoglobin: 14.3 g/dL (ref 13.0–17.0)
MCH: 30.2 pg (ref 26.0–34.0)
MCHC: 32.5 g/dL (ref 30.0–36.0)
MCV: 92.8 fL (ref 80.0–100.0)
Platelets: 207 10*3/uL (ref 150–400)
RBC: 4.74 MIL/uL (ref 4.22–5.81)
RDW: 14.9 % (ref 11.5–15.5)
WBC: 10.6 10*3/uL — ABNORMAL HIGH (ref 4.0–10.5)
nRBC: 0 % (ref 0.0–0.2)

## 2020-10-18 LAB — CREATININE, URINE, RANDOM: Creatinine, Urine: 99.53 mg/dL

## 2020-10-18 LAB — DIFFERENTIAL
Band Neutrophils: 2 %
Basophils Relative: 0 %
Blasts: NONE SEEN %
Eosinophils Relative: 0 %
Lymphocytes Relative: 12 %
Metamyelocytes Relative: NONE SEEN %
Monocytes Relative: 12 %
Myelocytes: NONE SEEN %
Neutrophils Relative %: 74 %
Promyelocytes Relative: NONE SEEN %
RBC Morphology: NORMAL
WBC Morphology: REACTIVE
nRBC: NONE SEEN /100 WBC

## 2020-10-18 LAB — FERRITIN: Ferritin: 107 ng/mL (ref 24–336)

## 2020-10-18 LAB — COMPREHENSIVE METABOLIC PANEL
ALT: 16 U/L (ref 0–44)
AST: 24 U/L (ref 15–41)
Albumin: 3.8 g/dL (ref 3.5–5.0)
Alkaline Phosphatase: 47 U/L (ref 38–126)
Anion gap: 12 (ref 5–15)
BUN: 20 mg/dL (ref 8–23)
CO2: 24 mmol/L (ref 22–32)
Calcium: 9 mg/dL (ref 8.9–10.3)
Chloride: 102 mmol/L (ref 98–111)
Creatinine, Ser: 1.76 mg/dL — ABNORMAL HIGH (ref 0.61–1.24)
GFR, Estimated: 36 mL/min — ABNORMAL LOW (ref >60)
Glucose, Bld: 95 mg/dL (ref 70–99)
Potassium: 4.1 mmol/L (ref 3.5–5.1)
Sodium: 138 mmol/L (ref 135–145)
Total Bilirubin: 0.9 mg/dL (ref 0.3–1.2)
Total Protein: 7.3 g/dL (ref 6.5–8.1)

## 2020-10-18 LAB — C-REACTIVE PROTEIN: CRP: 7.5 mg/dL — ABNORMAL HIGH (ref <1.0)

## 2020-10-18 LAB — CBG MONITORING, ED: Glucose-Capillary: 79 mg/dL (ref 70–99)

## 2020-10-18 LAB — RESP PANEL BY RT-PCR (FLU A&B, COVID) ARPGX2
Influenza A by PCR: NEGATIVE
Influenza B by PCR: NEGATIVE
SARS Coronavirus 2 by RT PCR: POSITIVE — AB

## 2020-10-18 LAB — SODIUM, URINE, RANDOM: Sodium, Ur: 147 mmol/L

## 2020-10-18 LAB — PROCALCITONIN: Procalcitonin: 0.12 ng/mL

## 2020-10-18 LAB — PHOSPHORUS: Phosphorus: 2.7 mg/dL (ref 2.5–4.6)

## 2020-10-18 LAB — CK: Total CK: 86 U/L (ref 49–397)

## 2020-10-18 LAB — TRIGLYCERIDES: Triglycerides: 96 mg/dL (ref <150)

## 2020-10-18 LAB — FIBRINOGEN: Fibrinogen: 603 mg/dL — ABNORMAL HIGH (ref 210–475)

## 2020-10-18 LAB — GLUCOSE, CAPILLARY: Glucose-Capillary: 80 mg/dL (ref 70–99)

## 2020-10-18 LAB — D-DIMER, QUANTITATIVE: D-Dimer, Quant: 1.58 ug/mL-FEU — ABNORMAL HIGH (ref 0.00–0.50)

## 2020-10-18 LAB — MAGNESIUM: Magnesium: 1.8 mg/dL (ref 1.7–2.4)

## 2020-10-18 LAB — TROPONIN I (HIGH SENSITIVITY)
Troponin I (High Sensitivity): 21 ng/L — ABNORMAL HIGH (ref <18)
Troponin I (High Sensitivity): 27 ng/L — ABNORMAL HIGH (ref <18)

## 2020-10-18 LAB — LACTATE DEHYDROGENASE: LDH: 141 U/L (ref 98–192)

## 2020-10-18 MED ORDER — ROSUVASTATIN CALCIUM 5 MG PO TABS
5.0000 mg | ORAL_TABLET | ORAL | Status: DC
  Administered 2020-10-19: 5 mg via ORAL
  Filled 2020-10-18: qty 1

## 2020-10-18 MED ORDER — GUAIFENESIN ER 600 MG PO TB12
600.0000 mg | ORAL_TABLET | Freq: Two times a day (BID) | ORAL | Status: DC
  Administered 2020-10-18 – 2020-10-21 (×6): 600 mg via ORAL
  Filled 2020-10-18 (×6): qty 1

## 2020-10-18 MED ORDER — CLOPIDOGREL BISULFATE 75 MG PO TABS
75.0000 mg | ORAL_TABLET | Freq: Every day | ORAL | Status: DC
  Administered 2020-10-19 – 2020-10-21 (×3): 75 mg via ORAL
  Filled 2020-10-18 (×3): qty 1

## 2020-10-18 MED ORDER — ENOXAPARIN SODIUM 30 MG/0.3ML IJ SOSY
30.0000 mg | PREFILLED_SYRINGE | INTRAMUSCULAR | Status: DC
  Administered 2020-10-18 – 2020-10-20 (×3): 30 mg via SUBCUTANEOUS
  Filled 2020-10-18 (×3): qty 0.3

## 2020-10-18 MED ORDER — AMLODIPINE BESYLATE 10 MG PO TABS
10.0000 mg | ORAL_TABLET | Freq: Every day | ORAL | Status: DC
  Administered 2020-10-18 – 2020-10-21 (×4): 10 mg via ORAL
  Filled 2020-10-18 (×4): qty 1

## 2020-10-18 MED ORDER — SODIUM CHLORIDE 0.9 % IV SOLN
100.0000 mg | Freq: Every day | INTRAVENOUS | Status: DC
  Administered 2020-10-19 – 2020-10-20 (×2): 100 mg via INTRAVENOUS
  Filled 2020-10-18 (×3): qty 20

## 2020-10-18 MED ORDER — ALBUTEROL SULFATE (2.5 MG/3ML) 0.083% IN NEBU: 2.5000 mg | INHALATION_SOLUTION | RESPIRATORY_TRACT | Status: DC | PRN

## 2020-10-18 MED ORDER — INSULIN ASPART 100 UNIT/ML IJ SOLN
0.0000 [IU] | Freq: Three times a day (TID) | INTRAMUSCULAR | Status: DC
  Administered 2020-10-19 – 2020-10-21 (×5): 2 [IU] via SUBCUTANEOUS
  Administered 2020-10-21: 3 [IU] via SUBCUTANEOUS

## 2020-10-18 MED ORDER — INSULIN ASPART 100 UNIT/ML IJ SOLN
0.0000 [IU] | INTRAMUSCULAR | Status: DC
  Filled 2020-10-18: qty 0.09

## 2020-10-18 MED ORDER — SODIUM CHLORIDE 0.9 % IV SOLN
75.0000 mL/h | INTRAVENOUS | Status: AC
  Administered 2020-10-18: 75 mL/h via INTRAVENOUS

## 2020-10-18 MED ORDER — SODIUM CHLORIDE 0.9 % IV SOLN
200.0000 mg | Freq: Once | INTRAVENOUS | Status: AC
  Administered 2020-10-18: 200 mg via INTRAVENOUS
  Filled 2020-10-18: qty 200

## 2020-10-18 MED ORDER — ACETAMINOPHEN 325 MG PO TABS
650.0000 mg | ORAL_TABLET | Freq: Four times a day (QID) | ORAL | Status: DC | PRN
  Administered 2020-10-19: 650 mg via ORAL
  Filled 2020-10-18: qty 2

## 2020-10-18 MED ORDER — ACETAMINOPHEN 650 MG RE SUPP: 650.0000 mg | Freq: Four times a day (QID) | RECTAL | Status: DC | PRN

## 2020-10-18 MED ORDER — HYDROCODONE-ACETAMINOPHEN 5-325 MG PO TABS
1.0000 | ORAL_TABLET | ORAL | Status: DC | PRN
  Administered 2020-10-18: 2 via ORAL
  Filled 2020-10-18: qty 2

## 2020-10-18 NOTE — ED Provider Notes (Signed)
[facility] DEPT Provider Note   CSN: [id number] Arrival date & time: 10/18/20  1609     History Chief Complaint  Patient presents with   Weakness   Emesis    [name] is a 87 y.o. male.  Patient c/o ?positive covid test at home yesterday, states recently around family member who was covid positive. Symptoms acute onset, moderate, persistent, worse today.  Pt is poor historian - level 5 caveat. Pt indicates has felt generally weak for past 2-3 days, with body ches, chills. Denies fever, although temp in ED 100.7. Occasional non prod cough. Mild sob and doe. No chest pain. No sore throat. No headache or neck pain/stiffness. No abd pain or nvd. No dysuria or gu c/o. No rash. Denies prior covid vaccine.   The history is provided by the patient, the EMS personnel and medical records. The history is limited by the condition of the patient.      Past Medical History:  Diagnosis Date   Adenomatous polyp    CAD (coronary artery disease)    Chronic kidney disease    Diverticulosis    DVT (deep venous thrombosis) (HCC)    Gout    History of pulmonary embolus (PE)    from a DVT in 2008   Hyperglycemia    Hyperlipidemia    Hypertension    Mallory-Weiss tear    Peripheral vascular disease ([place])    Stroke ([place]) 09/2015   SVT (supraventricular tachycardia) ([place])    Vitamin B12 deficiency     Patient Active Problem List   Diagnosis Date Noted   Syncope 05/10/2020   Gait disturbance, post-stroke 11/23/2015   Hemiparesis affecting right side as late effect of cerebrovascular accident (CVA) ([place]) 11/23/2015   Acute blood loss anemia    Acute lower UTI    Right elbow pain    Nausea without vomiting    Urinary retention    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 (HCC)    Urinary hesitancy    Right sided weakness    Acute ischemic left MCA stroke (HCC)    Hematuria    Bleeding    Leukocytosis    Depression     Steroid-induced hyperglycemia    Left pontine CVA ([place]) 10/21/2015   Dysarthria, post-stroke    Right hemiparesis (HCC)    Sinus arrest    CKD (chronic kidney disease)    DM type 2 without retinopathy ([place])    Coronary artery disease involving native coronary artery of native heart without angina pectoris    Acute idiopathic gout of right hand    Aphasia    Dysphagia, post-stroke    HLD (hyperlipidemia)    Benign essential HTN    Orthostatic hypotension    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[place]) 10/14/2015   Cerebral infarction ([place]) 10/14/2015   Accelerated hypertension 10/14/2015   Type 2 diabetes mellitus with circulatory disorder ([place]) 10/14/2015   Peripheral vascular disease, unspecified ([place]) 06/04/2013    Past Surgical History:  Procedure Laterality Date    PTCA of coronary lesion Right 1995   APPENDECTOMY     EP IMPLANTABLE DEVICE N/A 12/01/2015   Procedure: Loop Recorder Insertion;  Surgeon: Evans Lance, MD;  Location: [facility];  Service: Cardiovascular;  Laterality: N/A;   PR VEIN BYPASS GRAFT,AORTO-FEM-POP Left    femoral-popliteal BP by Dr. Drucie Opitz   repair of aal fissure   [phone]  Family History  Problem Relation Age of Onset   Cancer Mother        bladder   CAD Father    Heart attack Father     Social History   Tobacco Use   Smoking status: Never   Smokeless tobacco: Never  Substance Use Topics   Alcohol use: No   Drug use: No    Home Medications Prior to Admission medications   Medication Sig Start Date End Date Taking? Authorizing Provider  amLODipine (NORVASC) 10 MG tablet Take 1 tablet (10 mg total) by mouth daily. 11/12/15   Angiulli, Lavon Paganini, PA-C  aspirin 325 MG tablet Take 1 tablet (325 mg total) by mouth daily. 10/21/15   Patrecia Pour, MD  clopidogrel (PLAVIX) 75 MG tablet Take 1 tablet (75 mg total) by mouth daily. 11/11/15   Angiulli, Lavon Paganini, PA-C  famotidine (PEPCID) 20 MG tablet  Take 20 mg by mouth 2 (two) times daily. 05/21/20   [provider]  glimepiride (AMARYL) 2 MG tablet Take 1 tablet (2 mg total) by mouth daily with breakfast. 11/12/15   Angiulli, Lavon Paganini, PA-C  lisinopril (ZESTRIL) 30 MG tablet Take 1 tablet (30 mg total) by mouth daily. 05/12/20   Rai, Vernelle Emerald, MD  ondansetron (ZOFRAN ODT) 4 MG disintegrating tablet Take 1 tablet (4 mg total) by mouth every 8 (eight) hours as needed for nausea or vomiting. 05/11/20   Rai, Ripudeep K, MD  pantoprazole (PROTONIX) 40 MG tablet Take 1 tablet (40 mg total) by mouth daily. 05/12/20   Rai, Ripudeep K, MD  rosuvastatin (CRESTOR) 5 MG tablet Take 5 mg by mouth 2 (two) times a week. Monday and Friday 02/22/20   [provider]  traMADol (ULTRAM) 50 MG tablet Take 1 tablet (50 mg total) by mouth every 8 (eight) hours as needed for up to 15 doses. 08/16/20   Lennice Sites, DO  UNABLE TO FIND Outpatient physical therapy  Diagnosis: Prior history of CVA, generalized debility 05/11/20   Rai, Vernelle Emerald, MD    Allergies    Cephalexin, Glimepiride, Hydralazine, Lipitor [atorvastatin], and Zetia [ezetimibe]  Review of Systems   Review of Systems  Constitutional:  Positive for chills.  HENT:  Negative for sore throat.   Eyes:  Negative for redness.  Respiratory:  Positive for cough and shortness of breath.   Cardiovascular:  Negative for chest pain and leg swelling.  Gastrointestinal:  Positive for vomiting. Negative for abdominal pain and diarrhea.  Genitourinary:  Negative for dysuria and flank pain.  Musculoskeletal:  Positive for myalgias. Negative for back pain and neck pain.  Skin:  Negative for rash.  Neurological:  Positive for weakness. Negative for headaches.  Hematological:  Does not bruise/bleed easily.  Psychiatric/Behavioral:  Negative for confusion.    Physical Exam Updated Vital Signs BP (!) 157/70   Pulse 90   Temp (!) 100.7 F (38.2 C) (Oral)   Resp 20   SpO2 91%   Physical  Exam Vitals and nursing note reviewed.  Constitutional:      Appearance: Normal appearance. He is well-developed.  HENT:     Head: Atraumatic.     Nose: Nose normal.     Mouth/Throat:     Mouth: Mucous membranes are moist.     Pharynx: Oropharynx is clear.  Eyes:     General: No scleral icterus.    Conjunctiva/sclera: Conjunctivae normal.     Pupils: Pupils are equal, round, and reactive to light.  Neck:  Trachea: No tracheal deviation.     Comments: No stiffness or rigidity.  Cardiovascular:     Rate and Rhythm: Normal rate and regular rhythm.     Pulses: Normal pulses.     Heart sounds: Normal heart sounds. No murmur heard.   No friction rub. No gallop.  Pulmonary:     Effort: Pulmonary effort is normal. No accessory muscle usage or respiratory distress.     Breath sounds: Normal breath sounds.  Abdominal:     General: Bowel sounds are normal. There is no distension.     Palpations: Abdomen is soft.     Tenderness: There is no abdominal tenderness. There is no guarding.  Genitourinary:    Comments: No cva tenderness. Musculoskeletal:        General: No swelling or tenderness.     Cervical back: Normal range of motion and neck supple. No rigidity.     Right lower leg: No edema.     Left lower leg: No edema.  Skin:    General: Skin is warm and dry.     Findings: No rash.  Neurological:     Mental Status: He is alert.     Comments: Alert, speech clear.   Psychiatric:        Mood and Affect: Mood normal.    ED Results / Procedures / Treatments   Labs (all labs ordered are listed, but only abnormal results are displayed) Results for orders placed or performed during the hospital encounter of 10/18/20  CBC  Result Value Ref Range   WBC 10.6 (H) 4.0 - 10.5 K/uL   RBC 4.74 4.22 - 5.81 MIL/uL   Hemoglobin 14.3 13.0 - 17.0 g/dL   HCT 44.0 39.0 - 52.0 %   MCV 92.8 80.0 - 100.0 fL   MCH 30.2 26.0 - 34.0 pg   MCHC 32.5 30.0 - 36.0 g/dL   RDW 14.9 11.5 - 15.5 %    Platelets 207 150 - 400 K/uL   nRBC 0.0 0.0 - 0.2 %  Comprehensive metabolic panel  Result Value Ref Range   Sodium 138 135 - 145 mmol/L   Potassium 4.1 3.5 - 5.1 mmol/L   Chloride 102 98 - 111 mmol/L   CO2 24 22 - 32 mmol/L   Glucose, Bld 95 70 - 99 mg/dL   BUN 20 8 - 23 mg/dL   Creatinine, Ser 1.76 (H) 0.61 - 1.24 mg/dL   Calcium 9.0 8.9 - 10.3 mg/dL   Total Protein 7.3 6.5 - 8.1 g/dL   Albumin 3.8 3.5 - 5.0 g/dL   AST 24 15 - 41 U/L   ALT 16 0 - 44 U/L   Alkaline Phosphatase 47 38 - 126 U/L   Total Bilirubin 0.9 0.3 - 1.2 mg/dL   GFR, Estimated 36 (L) >60 mL/min   Anion gap 12 5 - 15  Lactate dehydrogenase  Result Value Ref Range   LDH 141 98 - 192 U/L  Ferritin  Result Value Ref Range   Ferritin 107 24 - 336 ng/mL  Triglycerides  Result Value Ref Range   Triglycerides 96 <150 mg/dL  Fibrinogen  Result Value Ref Range   Fibrinogen 603 (H) 210 - 475 mg/dL  C-reactive protein  Result Value Ref Range   CRP 7.5 (H) <1.0 mg/dL  Troponin I (High Sensitivity)  Result Value Ref Range   Troponin I (High Sensitivity) 21 (H) <18 ng/L    EKG None  Radiology DG Chest [facility] 1 View  Result  Date: 10/18/2020 CLINICAL DATA:  Cough, COVID, shortness of breath EXAM: PORTABLE CHEST 1 VIEW COMPARISON:  05/10/2020 FINDINGS: Heart and mediastinal contours are within normal limits. No focal opacities or effusions. No acute bony abnormality. IMPRESSION: No active disease. Electronically Signed   By: Rolm Baptise M.D.   On: 10/18/2020 17:30    Procedures Procedures   Medications Ordered in ED Medications - No data to display  ED Course  I have reviewed the triage vital signs and the nursing notes.  Pertinent labs & imaging results that were available during my care of the patient were reviewed by me and considered in my medical decision making (see chart for details).    MDM Rules/Calculators/A&P                           Iv ns. Continuous pulse ox and cardiac monitoring. Stat  labs. Ecg. Cxr.   Reviewed nursing notes and prior charts for additional history.   Labs reviewed/interpreted by me -  mild aki.   CXR reviewed/interpreted by me - No pna.  Kazimierz Rodi was evaluated in Emergency Department on 10/18/2020 for the symptoms described in the history of present illness. He was evaluated in the context of the global COVID-19 pandemic, which necessitated consideration that the patient might be at risk for infection with the SARS-CoV-2 virus that causes COVID-19. Institutional protocols and algorithms that pertain to the evaluation of patients at risk for COVID-19 are in a state of rapid change based on information released by regulatory bodies including the CDC and federal and state organizations. These policies and algorithms were followed during the patient's care in the ED.  On room air, pts sats 89-91% at rest. With minimal activity in room, sats in upper 80s.   Given age, comorbidities,weakness, nv, mildl hypoxia, mild aki, feel pt would benefit from admission/tx.   Hospitalist consulted for admission - discussed w Dr Roel Cluck - will admit.        Final Clinical Impression(s) / ED Diagnoses Final diagnoses:  None    Rx / DC Orders ED Discharge Orders     None        Lajean Saver, MD 10/18/20 [phone]

## 2020-10-18 NOTE — ED Notes (Signed)
Spoke with daughter and gave her an update on the patient.

## 2020-10-18 NOTE — ED Notes (Signed)
Pt is ready for transport

## 2020-10-18 NOTE — ED Triage Notes (Signed)
Per EMS, patient from home, positive Covid test at home yesterday with weakness, chills, and body aches without fever. N/V started today. A&Ox4.  18g R AC 453m NS '4mg'$  Zofran

## 2020-10-18 NOTE — H&P (Signed)
[name] [id number] DOB: May 23, 1927 DOA: 10/18/2020     PCP: Lajean Manes, MD   Outpatient Specialists:  CARDS:   Dr. Lovena Le    Patient arrived to ER on 10/18/20 at 1609 Referred by Attending Toy Baker, MD   Patient coming from: home Lives  With family    Chief Complaint:   Chief Complaint  Patient presents with   Weakness   Emesis    HPI: [name] is a 87 y.o. male with medical history significant of DM2, CVA, CKD stage 3b, HTN, HLD, GERD  Presented with 3 days of feeling generally fatigued some weakness chills body aches he did not report any fever he had some nausea and vomiting that started today. Family members are positive for COVID patient tested himself at home and found to be positive as well. Has been having some dry cough some shortness of breath worse with exertion.  But no chest pain no sore throat no headache  Infectious risk factors:  Reports  shortness of breath, dry cough,  Body aches, severe fatigue Reports known sick contacts, known COVID 19 exposure,       KNOWN COVID POSITIVE   Has  NOt been vaccinated against COVID **and boosted   Initial COVID TEST  in house  PCR testing  Pending  Lab Results  Component Value Date   [place] NEGATIVE 05/10/2020    Regarding pertinent Chronic problems:    Hyperlipidemia - on statins Crestor Lipid Panel     Component Value Date/Time   CHOL 155 05/11/2020 0445   TRIG 96 10/18/2020 1701   HDL 33 (L) 05/11/2020 0445   CHOLHDL 4.7 05/11/2020 0445   VLDL 16 05/11/2020 0445   LDLCALC 106 (H) 05/11/2020 0445    HTN on NOrvasc, lisinopril    chronic CHF diastolic  - last echo March 2022 showing grade 1 diastolic dysfunction    CAD  - On Aspirin, statin, Plavix                 -  followed by cardiology           DM 2 -  Lab Results  Component Value Date   HGBA1C 7.3 (H) 05/11/2020   on  PO meds only   Hx of CVA -   with residual deficits on Aspirin 81 mg, Plavix   CKD stage  IIIb- baseline Cr 1.4 CrCl cannot be calculated (Unknown ideal weight.).  Lab Results  Component Value Date   CREATININE 1.76 (H) 10/18/2020   CREATININE 1.22 08/16/2020   CREATININE 1.40 (H) 05/11/2020     While in ER: CXR no infiltrates Noted hypoxic on RA down to mid 80's     ED Triage Vitals  Enc Vitals Group     BP 10/18/20 1640 (!) 186/72     Pulse Rate 10/18/20 1640 89     Resp 10/18/20 1640 18     Temp 10/18/20 1640 (!) 100.7 F (38.2 C)     Temp Source 10/18/20 1640 Oral     SpO2 10/18/20 1640 96 %     Weight --      Height --      Head Circumference --      Peak Flow --      Pain Score 10/18/20 1647 0     Pain Loc --      Pain Edu? --      Excl. in [place]? --   TMAX(24)@     _________________________________________ Significant  initial  Findings: Abnormal Labs Reviewed  CBC - Abnormal; Notable for the following components:      Result Value   WBC 10.6 (*)    All other components within normal limits  COMPREHENSIVE METABOLIC PANEL - Abnormal; Notable for the following components:   Creatinine, Ser 1.76 (*)    GFR, Estimated 36 (*)    All other components within normal limits  FIBRINOGEN - Abnormal; Notable for the following components:   Fibrinogen 603 (*)    All other components within normal limits  C-REACTIVE PROTEIN - Abnormal; Notable for the following components:   CRP 7.5 (*)    All other components within normal limits  TROPONIN I (HIGH SENSITIVITY) - Abnormal; Notable for the following components:   Troponin I (High Sensitivity) 21 (*)    All other components within normal limits   ____________________________________________ Ordered    CXR -  NON acute  _________________________ Troponin 21 ECG: Ordered Personally reviewed by me showing: HR : 91 Rhythm: NSR,  no evidence of ischemic changes QTC 416  The recent clinical data is shown below. Vitals:   10/18/20 1640 10/18/20 1730 10/18/20 1800 10/18/20 1830  BP: (!) 186/72 (!) 151/62  (!) 157/70 138/83  Pulse: 89 89 90 96  Resp: 18 (!) 21 20 (!) 26  Temp: (!) 100.7 F (38.2 C)     TempSrc: Oral     SpO2: 96% 91% 91% 93%    WBC     Component Value Date/Time   WBC 10.6 (H) 10/18/2020 1701   LYMPHSABS 1.9 08/16/2020 0923   MONOABS 1.5 (H) 08/16/2020 0923   EOSABS 0.1 08/16/2020 0923   BASOSABS 0.0 08/16/2020 0923    Procalcitonin 0.12     UA   no evidence of UTI    Urine analysis:    Component Value Date/Time   COLORURINE YELLOW 10/18/2020 [place] 10/18/2020 1637   LABSPEC 1.013 10/18/2020 1637   PHURINE 5.0 10/18/2020 [place] 10/18/2020 1637   HGBUR MODERATE (A) 10/18/2020 [place] 10/18/2020 1637   KETONESUR NEGATIVE 10/18/2020 1637   PROTEINUR NEGATIVE 10/18/2020 1637   NITRITE NEGATIVE 10/18/2020 [place] 10/18/2020 1637     ________ Hospitalist was called for admission for AKI and Covid infection   The following Work up has been ordered so far:  Orders Placed This Encounter  Procedures   DG Chest Port 1 View   CBC   Comprehensive metabolic panel   Urinalysis, Routine w reflex microscopic   Procalcitonin   Lactate dehydrogenase   Ferritin   Triglycerides   Fibrinogen   C-reactive protein   Diet NPO time specified   Cardiac monitoring   Initiate Carrier Fluid Protocol   Place surgical mask on patient   Patient to wear surgical mask during transportation   Assess patient for ability to self-prone. If able (can move self in bed, ambulate) and stable (SpO2 and oxygen requirement):   RN/NT - Document specific oxygen requirements in [facility]   Notify EDP if new oxygen requirements escalates > 4L per minute [place]   Cardiac monitoring   Consult to hospitalist   Airborne and Contact precautions   Pulse oximetry, continuous   Oxygen therapy Mode or (Route): Nasal cannula; Liters Per Minute: 2   EKG 12-Lead   Admit to Inpatient (patient's expected length of stay will be  greater than 2 midnights or inpatient only procedure)     Following Medications  were ordered in ER: Medications - No data to display      Consult Orders  (From admission, onward)           Start     Ordered   10/18/20 1835  Consult to hospitalist  Once       Provider:  (Not yet assigned)  Question Answer Comment  Place call to: [facility]   Reason for Consult Admit      10/18/20 1834             OTHER Significant initial  Findings:  labs showing:  Recent Labs  Lab 10/18/20 1701  NA 138  K 4.1  CO2 24  GLUCOSE 95  BUN 20  CREATININE 1.76*  CALCIUM 9.0    Cr   Up from baseline see below Lab Results  Component Value Date   CREATININE 1.76 (H) 10/18/2020   CREATININE 1.22 08/16/2020   CREATININE 1.40 (H) 05/11/2020    Recent Labs  Lab 10/18/20 1701  AST 24  ALT 16  ALKPHOS 47  BILITOT 0.9  PROT 7.3  ALBUMIN 3.8   Lab Results  Component Value Date   CALCIUM 9.0 10/18/2020       Plt: Lab Results  Component Value Date   PLT 207 10/18/2020     COVID-19 Labs  Recent Labs    10/18/20 1701  FERRITIN 107  LDH 141  CRP 7.5*    Lab Results  Component Value Date   SARSCOV2NAA NEGATIVE 05/10/2020        Recent Labs  Lab 10/18/20 1701  WBC 10.6*  HGB 14.3  HCT 44.0  MCV 92.8  PLT 207    HG/HCT  stable,       Component Value Date/Time   HGB 14.3 10/18/2020 1701   HCT 44.0 10/18/2020 1701   MCV 92.8 10/18/2020 1701      Cardiac Panel (last 3 results) No results for input(s): CKTOTAL, CKMB, TROPONINI, RELINDX in the last 72 hours.   BNP (last 3 results) No results for input(s): BNP in the last 8760 hours.    DM  labs:  HbA1C: Recent Labs    05/11/20 0445  HGBA1C 7.3*       CBG (last 3)  No results for input(s): GLUCAP in the last 72 hours.        Cultures:    Component Value Date/Time   SDES BLOOD LEFT ANTECUBITAL 06/04/2016 2235   SPECREQUEST  06/04/2016 2235    BOTTLES DRAWN AEROBIC AND ANAEROBIC  Blood Culture adequate volume   CULT  06/04/2016 2235    NO GROWTH 5 DAYS Performed at [facility], [place] [address] [place], [place] [address]    REPTSTATUS 06/10/2016 FINAL 06/04/2016 2235     Radiological Exams on Admission: DG Chest Port 1 View  Result Date: 10/18/2020 CLINICAL DATA:  Cough, COVID, shortness of breath EXAM: PORTABLE CHEST 1 VIEW COMPARISON:  05/10/2020 FINDINGS: Heart and mediastinal contours are within normal limits. No focal opacities or effusions. No acute bony abnormality. IMPRESSION: No active disease. Electronically Signed   By: Rolm Baptise M.D.   On: 10/18/2020 17:30   _______________________________________________________________________________________________________ Latest  Blood pressure 138/83, pulse 96, temperature (!) 100.7 F (38.2 C), temperature source Oral, resp. rate (!) 26, SpO2 93 %.   Review of Systems:    Pertinent positives include:  chills, fatigue,  Constitutional:  No weight loss, night sweats, Fevers, weight loss  HEENT:  No headaches, Difficulty swallowing,Tooth/dental problems,Sore throat,  No sneezing, itching, ear ache, nasal congestion, post nasal drip,  Cardio-vascular:  No chest pain, Orthopnea, PND, anasarca, dizziness, palpitations.no Bilateral lower extremity swelling  GI:  No heartburn, indigestion, abdominal pain, nausea, vomiting, diarrhea, change in bowel habits, loss of appetite, melena, blood in stool, hematemesis Resp:  no shortness of breath at rest. No dyspnea on exertion, No excess mucus, no productive cough, No non-productive cough, No coughing up of blood.No change in color of mucus.No wheezing. Skin:  no rash or lesions. No jaundice GU:  no dysuria, change in color of urine, no urgency or frequency. No straining to urinate.  No flank pain.  Musculoskeletal:  No joint pain or no joint swelling. No decreased range of motion. No back pain.  Psych:  No change in mood or affect. No depression or  anxiety. No memory loss.  Neuro: no localizing neurological complaints, no tingling, no weakness, no double vision, no gait abnormality, no slurred speech, no confusion  All systems reviewed and apart from [place] all are negative _______________________________________________________________________________________________ Past Medical History:   Past Medical History:  Diagnosis Date   Adenomatous polyp    CAD (coronary artery disease)    Chronic kidney disease    Diverticulosis    DVT (deep venous thrombosis) (HCC)    Gout    History of pulmonary embolus (PE)    from a DVT in 2008   Hyperglycemia    Hyperlipidemia    Hypertension    Mallory-Weiss tear    Peripheral vascular disease ([place])    Stroke ([place]) 09/2015   SVT (supraventricular tachycardia) (HCC)    Vitamin B12 deficiency       Past Surgical History:  Procedure Laterality Date    PTCA of coronary lesion Right 1995   APPENDECTOMY     EP IMPLANTABLE DEVICE N/A 12/01/2015   Procedure: Loop Recorder Insertion;  Surgeon: Evans Lance, MD;  Location: [facility];  Service: Cardiovascular;  Laterality: N/A;   PR VEIN BYPASS GRAFT,AORTO-FEM-POP Left    femoral-popliteal BP by Dr. Drucie Opitz   repair of aal fissure   1978    Social History:  Ambulatory  cane,       reports that he has never smoked. He has never used smokeless tobacco. He reports that he does not drink alcohol and does not use drugs.     Family History:   Family History  Problem Relation Age of Onset   Cancer Mother        bladder   CAD Father    Heart attack Father    ______________________________________________________________________________________________ Allergies: Allergies  Allergen Reactions   Cephalexin     Other reaction(s): muscle/knee pain   Glimepiride     Other reaction(s): headache   Hydralazine Nausea Only   Lipitor [Atorvastatin]     Muscle pain    Zetia [Ezetimibe] Hives     Prior to Admission  medications   Medication Sig Start Date End Date Taking? Authorizing Provider  amLODipine (NORVASC) 10 MG tablet Take 1 tablet (10 mg total) by mouth daily. 11/12/15   Angiulli, Lavon Paganini, PA-C  aspirin 325 MG tablet Take 1 tablet (325 mg total) by mouth daily. 10/21/15   Patrecia Pour, MD  clopidogrel (PLAVIX) 75 MG tablet Take 1 tablet (75 mg total) by mouth daily. 11/11/15   Angiulli, Lavon Paganini, PA-C  famotidine (PEPCID) 20 MG tablet Take 20 mg by mouth 2 (two) times daily. 05/21/20   [provider]  glimepiride (AMARYL) 2  MG tablet Take 1 tablet (2 mg total) by mouth daily with breakfast. 11/12/15   Angiulli, Lavon Paganini, PA-C  lisinopril (ZESTRIL) 30 MG tablet Take 1 tablet (30 mg total) by mouth daily. 05/12/20   Rai, Vernelle Emerald, MD  ondansetron (ZOFRAN ODT) 4 MG disintegrating tablet Take 1 tablet (4 mg total) by mouth every 8 (eight) hours as needed for nausea or vomiting. 05/11/20   Rai, Ripudeep K, MD  pantoprazole (PROTONIX) 40 MG tablet Take 1 tablet (40 mg total) by mouth daily. 05/12/20   Rai, Ripudeep K, MD  rosuvastatin (CRESTOR) 5 MG tablet Take 5 mg by mouth 2 (two) times a week. Monday and Friday 02/22/20   [provider]  traMADol (ULTRAM) 50 MG tablet Take 1 tablet (50 mg total) by mouth every 8 (eight) hours as needed for up to 15 doses. 08/16/20   Lennice Sites, DO  UNABLE TO FIND Outpatient physical therapy  Diagnosis: Prior history of CVA, generalized debility 05/11/20   Mendel Corning, MD    ___________________________________________________________________________________________________ Physical Exam: Vitals with BMI 10/18/2020 10/18/2020 10/18/2020  Height - - -  Weight - - -  BMI - - -  Systolic 0000000 [id number]  Diastolic 83 70 62  Pulse 96 90 89     1. General:  in No  Acute distress    Chronically ill   2. Psychological: Alert and  Oriented 3. Head/ENT:     Dry Mucous Membranes                          Head Non traumatic, neck supple                            Poor Dentition 4. SKIN:  decreased Skin turgor,  Skin clean Dry and intact no rash 5. Heart: Regular rate and rhythm no  Murmur, no Rub or gallop 6. Lungs:   no wheezes or crackles   7. Abdomen: Soft,  non-tender, Non distended  bowel sounds present 8. Lower extremities: no clubbing, cyanosis, no  edema 9. Neurologically Grossly intact, moving all 4 extremities equally intact 10. MSK: Normal range of motion    Chart has been reviewed  ______________________________________________________________________________________________  Assessment/Plan  87 y.o. male with medical history significant of DM2, CVA, CKD stage 3b, HTN, HLD, GERD    Admitted for AKI and COVID infection   Present on Admission:  COVID-19 virus infection -  FROM HOME   WITH KNOWN HX OF COVID19 ER Novel Corona Virus testing:  Ordered 10/18/20 and is  pending   Immunization status: not immunized    Following concerning LAB/ imaging findings:       BMP: increased BUN/Cr   CRP,   increased   Procalcitonin: low      -Following work-up initiated:      sputum cultures  Ordered 10/18/20,      Following complications noted:  evidence of AKI - will provide gentle rehydration  acute respiratory failure with hypoxia - continue oxygen treatment as needed hypoxic with exertion only  Plan of treatment: Admit on [place] Precautions   pharmacy consult for remdesivir  Pt sp 4 days of Paxlovid  - Will follow daily d.dimer - Assess for ability to prone  - Supportive management -Fluid sparing resuscitation  -Provide oxygen as needed currently on  SpO2: 97 %    Poor Prognostic factors  87 y.o.  Personal hx of  DM2, CAD  HTN,  NON-Vaccinated status Evidence of  organ damage  present AKI,       Will order Airborne and Contact precautions  Family/ patient prognosis discussion: I have discussed case with the family/ patient  who are aware of their prognosis At this point they would like    to be  DNR/DNI      The treatment plan and use of medications and known side effects were discussed with patient/  family. It was clearly explained that there is no proven definitive treatment for COVID-19 infection yet. Any medications used here are based on case reports/anecdotal data which are not peer-reviewed and has not been studied using randomized control trials.  Complete risks and long-term side effects are unknown, however in the best clinical judgment they seem to be of some clinical benefit rather than medical risks.  Patient/ family agree with the treatment plan and want to receive these treatments as indicated.      Type 2 diabetes mellitus with circulatory disorder (HCC) -  - Order Sensitive    SSI    -  check TSH and HgA1C  - Hold by mouth medications    HLD (hyperlipidemia) -chronic stable continue home medication   Hx of  CVA - cont plavix   Coronary artery disease involving native coronary artery of native heart without angina pectoris -continue aspirin Plavix currently stable   AKI (acute kidney injury) ([place]) CKD (chronic kidney disease) -acute on chronic renal failure.  Gently rehydrate follow renal function. Avoid nephrotoxic medications   Benign essential HTN -resume Norvasc hold ACE inhibitor given AKI    Acute respiratory failure with hypoxia (HCC) -mild in the setting of COVID infection wants to have exertion.  Provide oxygen as needed  this patient has acute respiratory failure with Hypoxia    O2 saturatio< 90% on RA   Provide O2 therapy and titrate as needed  Continuous pulse ox   check Pulse ox with ambulation prior to discharge     may need  TC consult for home O2 set up   Prone if able  flutter valve ordered   Other plan as per orders.  DVT prophylaxis:  Lovenox       Code Status:    Code Status: Prior  DNR/DNI   as per patient   I had personally discussed CODE STATUS with patient        Family Communication:   Family not at  Bedside Discussed case with  patient's daughter on the phone  Disposition Plan:   To home once workup is complete and patient is stable   Following barriers for discharge:                            Electrolytes corrected                                                          Pain controlled with PO medications                               Afebrile,  able to transition to PO antibiotics  Will need to be able to tolerate PO                            Will likely need home health, home O2, set up                                Would benefit from PT/OT eval prior to DC  Ordered                   Swallow eval - SLP ordered                                       Consults called: none  Admission status:  ED Disposition     ED Disposition  Admit   Condition  --   [place]: [place] [[id number]]  Level of Care: Telemetry [5]  Admit to tele based on following criteria: Other see comments  Comments: covid  May admit patient to [facility] or [facility] if equivalent level of care is available:: No  Covid Evaluation: Confirmed COVID Positive  Diagnosis: COVID-19 virus infection [id number]  Admitting Physician: Toy Baker [[id number]]  Attending Physician: Toy Baker [[id number]]  Estimated length of stay: past midnight tomorrow  Certification:: I certify this patient will need inpatient services for at least 2 midnights            inpatient     I Expect 2 midnight stay secondary to severity of patient's current illness need for inpatient interventions justified by the following:  Severe lab/radiological/exam abnormalities including:    AKI, and COVID infection  and extensive comorbidities including:  DM2   CHF   CKD   history of stroke with residual deficits    That are currently affecting medical management.   I expect  patient to be hospitalized for 2 midnights requiring inpatient medical care.  Patient is at high risk for  adverse outcome (such as loss of life or disability) if not treated.  Indication for inpatient stay as follows:    Hemodynamic instability despite maximal medical therapy,    New or worsening hypoxia  Need for IV antibiotics, IV fluids, IV rate controling medications, IV antihypertensives, IV pain medications, IV anticoagulation    Level of care     tele    indefinitely please discontinue once patient no longer qualifies COVID-19 Labs    Lab Results  Component Value Date   [place] NEGATIVE 05/10/2020     Precautions: admitted as  covid positive Airborne and Contact precautions    PPE: Used by the provider:   N95   eye Goggles,  Gloves  gown    Abbi Mancini 10/18/2020, 10:15 PM    [facility]     after 2 AM please page floor coverage PA If 7AM-7PM, please contact the day team taking care of the patient using [email or web address]   Patient was evaluated in the context of the global COVID-19 pandemic, which necessitated consideration that the patient might be at risk for infection with the SARS-CoV-2 virus that causes COVID-19. Institutional protocols and algorithms that pertain to the evaluation of patients at risk for COVID-19 are in a state of rapid change based on information released by regulatory bodies including the CDC and federal and state organizations. These policies  and algorithms were followed during the patient's care.

## 2020-10-18 NOTE — ED Notes (Signed)
Call daughter, Sheppard Coil with update. [phone]

## 2020-10-19 ENCOUNTER — Encounter (HOSPITAL_COMMUNITY): Payer: Self-pay | Admitting: Internal Medicine

## 2020-10-19 DIAGNOSIS — R0902 Hypoxemia: Secondary | ICD-10-CM

## 2020-10-19 LAB — CBC WITH DIFFERENTIAL/PLATELET
Abs Immature Granulocytes: 0.04 10*3/uL (ref 0.00–0.07)
Basophils Absolute: 0 10*3/uL (ref 0.0–0.1)
Basophils Relative: 0 %
Eosinophils Absolute: 0.2 10*3/uL (ref 0.0–0.5)
Eosinophils Relative: 2 %
HCT: 45.3 % (ref 39.0–52.0)
Hemoglobin: 14.5 g/dL (ref 13.0–17.0)
Immature Granulocytes: 1 %
Lymphocytes Relative: 20 %
Lymphs Abs: 1.6 10*3/uL (ref 0.7–4.0)
MCH: 30 pg (ref 26.0–34.0)
MCHC: 32 g/dL (ref 30.0–36.0)
MCV: 93.6 fL (ref 80.0–100.0)
Monocytes Absolute: 1.3 10*3/uL — ABNORMAL HIGH (ref 0.1–1.0)
Monocytes Relative: 16 %
Neutro Abs: 4.9 10*3/uL (ref 1.7–7.7)
Neutrophils Relative %: 61 %
Platelets: 187 10*3/uL (ref 150–400)
RBC: 4.84 MIL/uL (ref 4.22–5.81)
RDW: 15.1 % (ref 11.5–15.5)
WBC: 8 10*3/uL (ref 4.0–10.5)
nRBC: 0 % (ref 0.0–0.2)

## 2020-10-19 LAB — FERRITIN
Ferritin: 117 ng/mL (ref 24–336)
Ferritin: 129 ng/mL (ref 24–336)

## 2020-10-19 LAB — COMPREHENSIVE METABOLIC PANEL
ALT: 17 U/L (ref 0–44)
AST: 34 U/L (ref 15–41)
Albumin: 3.8 g/dL (ref 3.5–5.0)
Alkaline Phosphatase: 43 U/L (ref 38–126)
Anion gap: 10 (ref 5–15)
BUN: 20 mg/dL (ref 8–23)
CO2: 21 mmol/L — ABNORMAL LOW (ref 22–32)
Calcium: 8.9 mg/dL (ref 8.9–10.3)
Chloride: 105 mmol/L (ref 98–111)
Creatinine, Ser: 1.64 mg/dL — ABNORMAL HIGH (ref 0.61–1.24)
GFR, Estimated: 39 mL/min — ABNORMAL LOW (ref >60)
Glucose, Bld: 89 mg/dL (ref 70–99)
Potassium: 3.9 mmol/L (ref 3.5–5.1)
Sodium: 136 mmol/L (ref 135–145)
Total Bilirubin: 0.9 mg/dL (ref 0.3–1.2)
Total Protein: 7 g/dL (ref 6.5–8.1)

## 2020-10-19 LAB — C-REACTIVE PROTEIN: CRP: 8.6 mg/dL — ABNORMAL HIGH (ref <1.0)

## 2020-10-19 LAB — HEMOGLOBIN A1C
Hgb A1c MFr Bld: 6.5 % — ABNORMAL HIGH (ref 4.8–5.6)
Mean Plasma Glucose: 139.85 mg/dL

## 2020-10-19 LAB — PHOSPHORUS: Phosphorus: 3.2 mg/dL (ref 2.5–4.6)

## 2020-10-19 LAB — TSH: TSH: 4.049 u[IU]/mL (ref 0.350–4.500)

## 2020-10-19 LAB — MAGNESIUM: Magnesium: 1.9 mg/dL (ref 1.7–2.4)

## 2020-10-19 LAB — D-DIMER, QUANTITATIVE: D-Dimer, Quant: 1.38 ug/mL-FEU — ABNORMAL HIGH (ref 0.00–0.50)

## 2020-10-19 LAB — GLUCOSE, CAPILLARY
Glucose-Capillary: 88 mg/dL (ref 70–99)
Glucose-Capillary: 154 mg/dL — ABNORMAL HIGH (ref 70–99)
Glucose-Capillary: 119 mg/dL — ABNORMAL HIGH (ref 70–99)

## 2020-10-19 MED ORDER — PREDNISONE 50 MG PO TABS: 50.0000 mg | ORAL_TABLET | Freq: Every day | ORAL | Status: DC

## 2020-10-19 MED ORDER — ONDANSETRON HCL 4 MG/2ML IJ SOLN
4.0000 mg | Freq: Four times a day (QID) | INTRAMUSCULAR | Status: DC | PRN
  Administered 2020-10-19: 4 mg via INTRAVENOUS
  Filled 2020-10-19: qty 2

## 2020-10-19 MED ORDER — GUAIFENESIN-DM 100-10 MG/5ML PO SYRP
5.0000 mL | ORAL_SOLUTION | ORAL | Status: DC | PRN
  Administered 2020-10-19: 5 mL via ORAL
  Filled 2020-10-19: qty 10

## 2020-10-19 MED ORDER — PHENOL 1.4 % MT LIQD
1.0000 | OROMUCOSAL | Status: DC | PRN
  Administered 2020-10-20: 1 via OROMUCOSAL
  Filled 2020-10-19: qty 177

## 2020-10-19 MED ORDER — ASCORBIC ACID 500 MG PO TABS
500.0000 mg | ORAL_TABLET | Freq: Every day | ORAL | Status: DC
  Administered 2020-10-19 – 2020-10-21 (×3): 500 mg via ORAL
  Filled 2020-10-19 (×3): qty 1

## 2020-10-19 MED ORDER — METHYLPREDNISOLONE SODIUM SUCC 125 MG IJ SOLR
75.0000 mg | INTRAMUSCULAR | Status: DC
Start: 2020-10-19 — End: 2020-10-21
  Administered 2020-10-19 – 2020-10-20 (×2): 75 mg via INTRAVENOUS
  Filled 2020-10-19 (×3): qty 2

## 2020-10-19 MED ORDER — ZINC SULFATE 220 (50 ZN) MG PO CAPS
220.0000 mg | ORAL_CAPSULE | Freq: Every day | ORAL | Status: DC
  Administered 2020-10-20 – 2020-10-21 (×2): 220 mg via ORAL
  Filled 2020-10-19 (×2): qty 1

## 2020-10-19 NOTE — Hospital Course (Addendum)
87 year old man presented with fatigue, chills, nausea, vomiting, positive family contact for COVID, found to be positive COVID himself at home.  Admitted for COVID-19 with mild hypoxia.  Doing well, likely discharge 8/24

## 2020-10-19 NOTE — Evaluation (Signed)
Occupational Therapy Evaluation Patient Details Name: [name] MRN: [id number] DOB: Dec 11, 1927 Today's Date: 10/19/2020    History of Present Illness [name] is a 87 y.o. male who is covid19 positive. PMH: DM2, CVA, CKD stage 3b, HTN, HLD, GERD   Clinical Impression   Patient is a 87 year old male who was admitted for above. Patient was previously living at home with 24/7 supervision from daughter. Currently, patient is noted to have decreased activity tolerance, increased confusion, decreased standing balance, and decreased endurance impacting patients participation in ADLs. Patient would continue to benefit from skilled OT services at this time while admitted to address noted deficits in order to improve overall safety and independence in ADLs.      Follow Up Recommendations  No OT follow up    Equipment Recommendations  None recommended by OT           Precautions / Restrictions Precautions Precautions: Fall Restrictions Weight Bearing Restrictions: No      Mobility Bed Mobility Overal bed mobility: Needs Assistance Bed Mobility: Supine to Sit     Supine to sit: Min guard     General bed mobility comments: patient was in recliner    Transfers Overall transfer level: Needs assistance Equipment used: Rolling walker (2 wheeled) Transfers: Sit to/from Stand Sit to Stand: Min assist Stand pivot transfers: Min assist       General transfer comment: min A to power to stand and steady, weight slightly posterior that improves with time    Balance Overall balance assessment: Needs assistance Sitting-balance support: Feet supported;No upper extremity supported Sitting balance-Leahy Scale: Fair     Standing balance support: Bilateral upper extremity supported Standing balance-Leahy Scale: Poor                             ADL either performed or assessed with clinical judgement   ADL Overall ADL's : Needs assistance/impaired Eating/Feeding: Set  up;Sitting   Grooming: Oral care;Wash/dry face;Wash/dry hands;Set up;Sitting   Upper Body Bathing: Sitting;Minimal assistance   Lower Body Bathing: Sitting/lateral leans;Moderate assistance Lower Body Bathing Details (indicate cue type and reason): patient reported having assistance for LB Dressing tasks from daughter for socks. patient reported being able to complete donning pants and undergarment. Upper Body Dressing : Set up;Sitting   Lower Body Dressing: Minimal assistance;Sit to/from stand Lower Body Dressing Details (indicate cue type and reason): patient reported having assistance for LB Dressing tasks from daughter for socks. patient reported being able to complete donning pants and undergarment. Toilet Transfer: Radiographer, therapeutic Details (indicate cue type and reason): patietn was min A to sit down on commode due to height to prevent plopping. patient was educated on using grab bar v.s. holding rolling walker. Toileting- Clothing Manipulation and Hygiene: Minimal assistance;Sit to/from stand;Cueing for sequencing Toileting - Clothing Manipulation Details (indicate cue type and reason): with education to get pants up from under feet prior to attempting to stand up.     Functional mobility during ADLs: Rolling walker;Min guard       Vision Patient Visual Report: No change from baseline                  Pertinent Vitals/Pain Pain Assessment: No/denies pain     Hand Dominance Right   Extremity/Trunk Assessment Upper Extremity Assessment Upper Extremity Assessment: Defer to OT evaluation RUE Deficits / Details: patient was noted to have decreased dexterity of right index finger with increased  time for movements. patient reported side effect of CVA. patient able to range shoulder to 90 degrees bilaterally abduction. RUE Coordination: decreased fine motor (patient reported having difficulty with tying shoes at home with daughter support when  needing to leave house. patient reported using slide on slippers in the house to avoid this.)   Lower Extremity Assessment Lower Extremity Assessment: Overall WFL for tasks assessed RLE Deficits / Details: AROM WNL, strength 4/5 throughout, denies numbness/tingling RLE Sensation: WNL RLE Coordination: WNL LLE Deficits / Details: AROM WNL, strength 4+/5 throughout, denies numbness/tingling LLE Sensation: WNL LLE Coordination: WNL   Cervical / Trunk Assessment Cervical / Trunk Assessment: Normal   Communication Communication Communication: No difficulties   Cognition Arousal/Alertness: Awake/alert Behavior During Therapy: WFL for tasks assessed/performed Overall Cognitive Status: Within Functional Limits for tasks assessed                                 General Comments: Pt alert and oriented to self, location, situation and year only. patient was noted to take conversations to off topic tasks and needed increased safety cues to maintain on task.   General Comments  Pt on RA with SpO2 >90% during eval, 96% at EOS when up in chair    Exercises General Exercises - Lower Extremity Long Arc Quad: Seated;AROM;Strengthening;Both;15 reps Hip Flexion/Marching: Seated;AROM;Strengthening;Both;15 reps   Shoulder Instructions      Home Living Family/patient expects to be discharged to:: Private residence Living Arrangements: Children Available Help at Discharge: Family;Available 24 hours/day Type of Home: House Home Access: Stairs to enter [employer] of Steps: 3 Entrance Stairs-Rails: Left Home Layout: Able to live on main level with bedroom/bathroom;Two level;Laundry or work area in basement     [employer] Shower/Tub: Occupational psychologist: Standard Bathroom Accessibility: Yes   Home Equipment: Kasandra Knudsen - single point;Walker - 2 wheels   Additional Comments: lives with daughter and son in [place].  Pt stated he is unaware whether he has a shower seat,  but confirmed that he sits down to shower.      Prior Functioning/Environment Level of Independence: Independent with assistive device(s)        Comments: Pt reports independent with bathing, dressing, fixing small meals, toileting, household ambulation with SPC or RW, able to ambulate limited community distances. Pt's daughter providing transportation        OT Problem List: Decreased strength;Decreased activity tolerance;Impaired balance (sitting and/or standing);Decreased safety awareness;Decreased knowledge of use of DME or AE      OT Treatment/Interventions: Self-care/ADL training;Therapeutic exercise;Therapeutic activities;Balance training;DME and/or AE instruction;Patient/family education    OT Goals(Current goals can be found in the care plan section) Acute Rehab OT Goals Patient Stated Goal: return home with daughter OT Goal Formulation: With patient Time For Goal Achievement: 11/02/20 Potential to Achieve Goals: Good  OT Frequency: Min 2X/week    AM-PAC OT "6 Clicks" Daily Activity     Outcome Measure Help from another person eating meals?: A Little Help from another person taking care of personal grooming?: A Little Help from another person toileting, which includes using toliet, bedpan, or urinal?: A Lot Help from another person bathing (including washing, rinsing, drying)?: A Lot Help from another person to put on and taking off regular upper body clothing?: A Little Help from another person to put on and taking off regular lower body clothing?: A Lot 6 Click Score: 15   End of Session Equipment  Utilized During Treatment: Gait belt;Rolling walker Nurse Communication: Mobility status  Activity Tolerance: Patient tolerated treatment well Patient left: in chair;with call bell/phone within reach;with chair alarm set  OT Visit Diagnosis: Unsteadiness on feet (R26.81);Muscle weakness (generalized) (M62.81)                Time: 1237-1300 OT Time Calculation (min): 23  min Charges:  OT General Charges $OT Visit: 1 Visit OT Evaluation $OT Eval Low Complexity: 1 Low OT Treatments $Self Care/Home Management : 8-22 mins  Jackelyn Poling OTR/L, MS Acute Rehabilitation Department Office# [phone] Pager# [phone]   [place] 10/19/2020, 2:02 PM

## 2020-10-19 NOTE — Assessment & Plan Note (Signed)
-  continue Plavix °

## 2020-10-19 NOTE — Assessment & Plan Note (Addendum)
--  suspect stage IIIa; creatinine stable, AKI ruled out

## 2020-10-19 NOTE — Assessment & Plan Note (Addendum)
--   Mild hypoxia only has resolved.  Continue remdesivir and steroids given elevated CRP will reevaluate tomorrow.  Was treated with 4 days of Paxlovid as outpatient.

## 2020-10-19 NOTE — Assessment & Plan Note (Signed)
--  stable, continue SSI, can resume glimepiride as outpatient

## 2020-10-19 NOTE — Evaluation (Signed)
Physical Therapy Evaluation Patient Details Name: [name] MRN: [id number] DOB: [birth year] Today's Date: 10/19/2020   History of Present Illness  [name] is a 87 y.o. male who is covid19 positive. PMH: DM2, CVA, CKD stage 3b, HTN, HLD, GERD  Clinical Impression  Pt admitted with above diagnosis. Pt from home with daughter who works from home, using [facility] or RW for household and limiting community distances, reports stroke ~3 years ago with R sided weakness but is independent with all mobility. Pt currently requiring min A to power to stand, min guard to steady with ambulation, maintains SpO2 >90% on RA and able to continue conversation while ambulating. Pt tolerates seated BLE strengthening exercises; educated on spending time OOB, seated exercise, and ambulating with nursing to restroom/in room as able. Recommend return home with daughter and son-in-law who are present 24/7. Pt currently with functional limitations due to the deficits listed below (see PT Problem List). Pt will benefit from skilled PT to increase their independence and safety with mobility to allow discharge to the venue listed below.       Follow Up Recommendations No PT follow up;Supervision - Intermittent    Equipment Recommendations  None recommended by PT    Recommendations for Other Services       Precautions / Restrictions Precautions Precautions: Fall Restrictions Weight Bearing Restrictions: No      Mobility  Bed Mobility Overal bed mobility: Needs Assistance Bed Mobility: Supine to Sit  Supine to sit: Min guard  General bed mobility comments: increased time and effort, use of bedrail to upright trunk and scoot out to EOB    Transfers Overall transfer level: Needs assistance Equipment used: Rolling walker (2 wheeled) Transfers: Sit to/from Stand Sit to Stand: Min assist Stand pivot transfers: Min assist  General transfer comment: min A to power to stand and steady, weight slightly posterior that  improves with time  Ambulation/Gait Ambulation/Gait assistance: Min guard Gait Distance (Feet): 20 Feet Assistive device: Rolling walker (2 wheeled) Gait Pattern/deviations: Step-through pattern;Decreased stride length Gait velocity: decreased   General Gait Details: step through pattern using RW, equal bil step length and foot clearance, trunk slightly flexed forward, able to continue conversation without SOB  Stairs            Wheelchair Mobility    Modified Rankin (Stroke Patients Only)       Balance Overall balance assessment: Mild deficits observed, not formally tested       Pertinent Vitals/Pain Pain Assessment: No/denies pain    Home Living Family/patient expects to be discharged to:: Private residence Living Arrangements: Children Available Help at Discharge: Family;Available 24 hours/day Type of Home: House Home Access: Stairs to enter Entrance Stairs-Rails: Left Entrance Stairs-Number of Steps: 3 Home Layout: Able to live on main level with bedroom/bathroom;Two level;Laundry or work area in [place]: Kasandra Knudsen - single point;Walker - 2 wheels Additional Comments: lives with daughter and son in [place].  Pt stated he is unaware whether he has a shower seat, but confirmed that he sits down to shower.    Prior Function Level of Independence: Independent with assistive device(s)  Comments: Pt reports independent with bathing, dressing, fixing small meals, toileting, household ambulation with SPC or RW, able to ambulate limited community distances. Pt's daughter providing transportation     Hand Dominance   Dominant Hand: Right    Extremity/Trunk Assessment   Upper Extremity Assessment Upper Extremity Assessment: Defer to OT evaluation RUE Deficits / Details: patient was noted to have  decreased dexterity of right index finger with increased time for movements. patient reported side effect of CVA. patient able to range shoulder to 90 degrees  bilaterally abduction. RUE Coordination: decreased fine motor (patient reported having difficulty with tying shoes at home with daughter support when needing to leave house. patient reported using slide on slippers in the house to avoid this.)    Lower Extremity Assessment Lower Extremity Assessment: Overall WFL for tasks assessed RLE Deficits / Details: AROM WNL, strength 4/5 throughout, denies numbness/tingling RLE Sensation: WNL RLE Coordination: WNL LLE Deficits / Details: AROM WNL, strength 4+/5 throughout, denies numbness/tingling LLE Sensation: WNL LLE Coordination: WNL    Cervical / Trunk Assessment Cervical / Trunk Assessment: Normal  Communication   Communication: No difficulties  Cognition Arousal/Alertness: Awake/alert Behavior During Therapy: WFL for tasks assessed/performed Overall Cognitive Status: Within Functional Limits for tasks assessed  General Comments: Pt alert and oriented to self, location, situation and year only.      General Comments General comments (skin integrity, edema, etc.): Pt on RA with SpO2 >90% during eval, 96% at EOS when up in chair    Exercises General Exercises - Lower Extremity Long Arc Quad: Seated;AROM;Strengthening;Both;15 reps Hip Flexion/Marching: Seated;AROM;Strengthening;Both;15 reps   Assessment/Plan    PT Assessment Patient needs continued PT services  PT Problem List Decreased activity tolerance;Decreased mobility;Cardiopulmonary status limiting activity;Decreased balance       PT Treatment Interventions DME instruction;Gait training;Functional mobility training;Therapeutic activities;Therapeutic exercise;Balance training;Neuromuscular re-education;Patient/family education    PT Goals (Current goals can be found in the Care Plan section)  Acute Rehab PT Goals Patient Stated Goal: return home with daughter PT Goal Formulation: With patient Time For Goal Achievement: 11/02/20 Potential to Achieve Goals: Good     Frequency Min 3X/week   Barriers to discharge        Co-evaluation               AM-PAC PT "6 Clicks" Mobility  Outcome Measure Help needed turning from your back to your side while in a flat bed without using bedrails?: A Little Help needed moving from lying on your back to sitting on the side of a flat bed without using bedrails?: A Little Help needed moving to and from a bed to a chair (including a wheelchair)?: A Little Help needed standing up from a chair using your arms (e.g., wheelchair or bedside chair)?: A Little Help needed to walk in hospital room?: A Little Help needed climbing 3-5 steps with a railing? : A Little 6 Click Score: 18    End of Session Equipment Utilized During Treatment: Gait belt Activity Tolerance: Patient tolerated treatment well Patient left: in chair;with call bell/phone within reach;with chair alarm set;Other (comment) (MD in room) Nurse Communication: Mobility status;Other (comment) (SpO2 on RA) PT Visit Diagnosis: Other abnormalities of gait and mobility (R26.89)    Time: [id number] PT Time Calculation (min) (ACUTE ONLY): 27 min   Charges:   PT Evaluation $PT Eval Low Complexity: 1 Low PT Treatments $Therapeutic Activity: 8-22 mins         Tori Shruthi Northrup PT, DPT 10/19/20, 1:14 PM

## 2020-10-19 NOTE — Progress Notes (Signed)
PROGRESS NOTE  [name] [id number] DOB: [birth year] DOA: 10/18/2020 PCP: Lajean Manes, MD  Brief History   87 year old man presented with fatigue, chills, nausea, vomiting, positive family contact for COVID, found to be positive COVID himself at home.  Admitted for COVID-19 with mild hypoxia.  A & P  * COVID-19 virus infection -- Mild hypoxia only, started on remdesivir and steroids, markedly improved today, will monitor overnight, may be able to go home tomorrow.  Was treated with 4 days of Paxlovid as outpatient. -- Follow inflammatory markers.  Hypoxia -- Very mild, not below 90%.  Supportive care.  DM type 2 without retinopathy ([place]) --stable, continue SSI, can resume glimepiride as outpatient  CKD (chronic kidney disease), stage III ([place]) --creatinine stable, AKI ruled out  Hemiparesis affecting right side as late effect of cerebrovascular accident (CVA) ([place]) --continue Plavix   Disposition Plan:  Discussion: discussed w/ daughter by telephone, anticipate discharge tomorrow, patient improving. No PT need  Status is: Inpatient  Remains inpatient appropriate because:IV treatments appropriate due to intensity of illness or inability to take PO and Inpatient level of care appropriate due to severity of illness  Dispo: The patient is from: Home              Anticipated d/c is to: Home              Patient currently is not medically stable to d/c.   Difficult to place patient No  DVT prophylaxis: enoxaparin (LOVENOX) injection 30 mg Start: 10/18/20 2200   Code Status: DNR Level of care: Telemetry Family Communication:   Murray Hodgkins, MD  [facility] Direct contact: see [email or web address] (further directions at bottom of note if needed) 7PM-7AM contact night coverage as at bottom of note 10/19/2020, 3:10 PM  LOS: 1 day   Interval History/Subjective  CC: f/u SOB  Feels much better Breathing better  Objective   Vitals:  Vitals:   10/19/20 0307 10/19/20  1355  BP: 127/83 137/76  Pulse: 72 76  Resp: 18 16  Temp: 98.6 F (37 C) 98.6 F (37 C)  SpO2: 94% 95%    Exam: Physical Exam Constitutional:      General: He is not in acute distress.    Appearance: Normal appearance. He is not ill-appearing or toxic-appearing.  Cardiovascular:     Rate and Rhythm: Normal rate and regular rhythm.     Heart sounds: No murmur heard. Pulmonary:     Effort: Pulmonary effort is normal. No respiratory distress.     Breath sounds: Normal breath sounds. No wheezing, rhonchi or rales.  Musculoskeletal:     Right lower leg: No edema.     Left lower leg: No edema.  Neurological:     Mental Status: He is alert.  Psychiatric:        Mood and Affect: Mood normal.        Behavior: Behavior normal.    I have personally reviewed the labs and other data, making special note of:   Today's Data  Creatinine stable 1.64 CRP 8.6 CBC unremarkable Ddimer down to 1.38  Scheduled Meds:  amLODipine  10 mg Oral Daily   vitamin C  500 mg Oral Daily   clopidogrel  75 mg Oral Daily   enoxaparin (LOVENOX) injection  30 mg Subcutaneous Q24H   guaiFENesin  600 mg Oral BID   insulin aspart  0-9 Units Subcutaneous TID WC   methylPREDNISolone (SOLU-MEDROL) injection  75 mg Intravenous Q24H   Followed by   [  START ON 10/22/2020] predniSONE  50 mg Oral Daily   rosuvastatin  5 mg Oral Once per day on Mon Fri   zinc sulfate  220 mg Oral Daily   Continuous Infusions:  remdesivir 100 mg in NS 100 mL 100 mg (10/19/20 1022)    Principal Problem:   COVID-19 virus infection Active Problems:   Hypoxia   CKD (chronic kidney disease), stage III ([place])   DM type 2 without retinopathy ([place])   Benign essential HTN   Coronary artery disease involving native coronary artery of native heart without angina pectoris   Hemiparesis affecting right side as late effect of cerebrovascular accident (CVA) ([place])   LOS: 1 day   How to contact the [facility] Attending or Consulting provider  [place] or covering provider during after hours [place], for this patient?  Check the care team in [facility] and look for a) attending/consulting [facility] provider listed and b) the [facility] team listed Log into [email or web address] and use [place]'s universal password to access. If you do not have the password, please contact the hospital operator. Locate the [facility] provider you are looking for under [facility] and page to a number that you can be directly reached. If you still have difficulty reaching the provider, please page the [facility] (Director on Call) for the Hospitalists listed on amion for assistance.

## 2020-10-19 NOTE — Assessment & Plan Note (Signed)
--   Very mild, not below 90%.  Supportive care.

## 2020-10-20 LAB — C-REACTIVE PROTEIN: CRP: 8.4 mg/dL — ABNORMAL HIGH (ref <1.0)

## 2020-10-20 LAB — GLUCOSE, CAPILLARY
Glucose-Capillary: 234 mg/dL — ABNORMAL HIGH (ref 70–99)
Glucose-Capillary: 177 mg/dL — ABNORMAL HIGH (ref 70–99)
Glucose-Capillary: 171 mg/dL — ABNORMAL HIGH (ref 70–99)
Glucose-Capillary: 167 mg/dL — ABNORMAL HIGH (ref 70–99)
Glucose-Capillary: 241 mg/dL — ABNORMAL HIGH (ref 70–99)

## 2020-10-20 LAB — D-DIMER, QUANTITATIVE: D-Dimer, Quant: 1.12 ug/mL-FEU — ABNORMAL HIGH (ref 0.00–0.50)

## 2020-10-20 NOTE — Plan of Care (Signed)
  Problem: Education: Goal: Knowledge of General Education information will improve Description: Including pain rating scale, medication(s)/side effects and non-pharmacologic comfort measures Outcome: Progressing   Problem: Activity: Goal: Risk for activity intolerance will decrease Outcome: Progressing   Problem: Nutrition: Goal: Adequate nutrition will be maintained Outcome: Progressing   Problem: Coping: Goal: Level of anxiety will decrease Outcome: Progressing   Problem: Elimination: Goal: Will not experience complications related to urinary retention Outcome: Progressing   Problem: Pain Managment: Goal: General experience of comfort will improve Outcome: Progressing   Problem: Safety: Goal: Ability to remain free from injury will improve Outcome: Progressing   Problem: Skin Integrity: Goal: Risk for impaired skin integrity will decrease Outcome: Progressing   Problem: Respiratory: Goal: Will maintain a patent airway Outcome: Progressing

## 2020-10-20 NOTE — Progress Notes (Addendum)
PROGRESS NOTE  [name] [id number] DOB: 1928/02/23 DOA: 10/18/2020 PCP: Lajean Manes, MD  Brief History   87 year old man presented with fatigue, chills, nausea, vomiting, positive family contact for COVID, found to be positive COVID himself at home.  Admitted for COVID-19 with mild hypoxia.  Doing well, likely discharge 8/24  A & P  * COVID-19 virus infection -- Mild hypoxia only has resolved.  Continue remdesivir and steroids given elevated CRP will reevaluate tomorrow.  Was treated with 4 days of Paxlovid as outpatient.   Hypoxia -- Very mild, not below 90%.  Supportive care.  DM type 2 without retinopathy ([place]) --stable, continue SSI, can resume glimepiride as outpatient  CKD (chronic kidney disease), stage III ([place]) --suspect stage IIIa; creatinine stable, AKI ruled out  Hemiparesis affecting right side as late effect of cerebrovascular accident (CVA) ([place]) --continue Plavix   Disposition Plan:  Discussion: Overall doing well, anticipate discharge home tomorrow.  No PT needed.  Status is: Inpatient  Remains inpatient appropriate because:IV treatments appropriate due to intensity of illness or inability to take PO and Inpatient level of care appropriate due to severity of illness  Dispo: The patient is from: Home              Anticipated d/c is to: Home              Patient currently is not medically stable to d/c.   Difficult to place patient No  DVT prophylaxis: enoxaparin (LOVENOX) injection 30 mg Start: 10/18/20 2200   Code Status: DNR Level of care: Telemetry Family Communication: updated daughter Mariann Laster by phone  Murray Hodgkins, MD  [facility] Direct contact: see [email or web address] (further directions at bottom of note if needed) 7PM-7AM contact night coverage as at bottom of note 10/20/2020, 2:17 PM  LOS: 2 days   Interval History/Subjective  CC: f/u SOB  Feels ok, breathing ok  Objective   Vitals:  Vitals:   10/19/20 2154 10/20/20 0659  BP:  (!) 155/81 (!) 152/78  Pulse: 83 65  Resp: 18 18  Temp: 98.2 F (36.8 C) 98.8 F (37.1 C)  SpO2: 98% 97%    Exam: Physical Exam Constitutional:      Appearance: Normal appearance.  Cardiovascular:     Rate and Rhythm: Normal rate and regular rhythm.     Heart sounds: No murmur heard. Pulmonary:     Effort: Pulmonary effort is normal. No respiratory distress.     Breath sounds: Normal breath sounds. No wheezing, rhonchi or rales.  Neurological:     Mental Status: He is alert.  Psychiatric:        Mood and Affect: Mood normal.        Behavior: Behavior normal.    I have personally reviewed the labs and other data, making special note of:   Today's Data  CRP 8.6 > 8.4 Ddimer 1.38 > 1.12  Scheduled Meds:  amLODipine  10 mg Oral Daily   vitamin C  500 mg Oral Daily   clopidogrel  75 mg Oral Daily   enoxaparin (LOVENOX) injection  30 mg Subcutaneous Q24H   guaiFENesin  600 mg Oral BID   insulin aspart  0-9 Units Subcutaneous TID WC   methylPREDNISolone (SOLU-MEDROL) injection  75 mg Intravenous Q24H   Followed by   Derrill Memo ON 10/22/2020] predniSONE  50 mg Oral Daily   rosuvastatin  5 mg Oral Once per day on Mon Fri   zinc sulfate  220 mg Oral Daily   Continuous  Infusions:  remdesivir 100 mg in NS 100 mL 100 mg (10/20/20 0930)    Principal Problem:   COVID-19 virus infection Active Problems:   Hypoxia   CKD (chronic kidney disease), stage III (HCC)   DM type 2 without retinopathy ([place])   Benign essential HTN   Coronary artery disease involving native coronary artery of native heart without angina pectoris   Hemiparesis affecting right side as late effect of cerebrovascular accident (CVA) ([place])   LOS: 2 days   How to contact the [facility] Attending or Consulting provider [place] or covering provider during after hours [place], for this patient?  Check the care team in [facility] and look for a) attending/consulting [facility] provider listed and b) the [facility] team listed Log into  [email or web address] and use [place]'s universal password to access. If you do not have the password, please contact the hospital operator. Locate the [facility] provider you are looking for under [facility] and page to a number that you can be directly reached. If you still have difficulty reaching the provider, please page the [facility] (Director on Call) for the Hospitalists listed on amion for assistance.

## 2020-10-21 LAB — GLUCOSE, CAPILLARY
Glucose-Capillary: 173 mg/dL — ABNORMAL HIGH (ref 70–99)
Glucose-Capillary: 236 mg/dL — ABNORMAL HIGH (ref 70–99)

## 2020-10-21 LAB — C-REACTIVE PROTEIN: CRP: 4.3 mg/dL — ABNORMAL HIGH (ref <1.0)

## 2020-10-21 LAB — D-DIMER, QUANTITATIVE: D-Dimer, Quant: 0.74 ug/mL-FEU — ABNORMAL HIGH (ref 0.00–0.50)

## 2020-10-21 MED ORDER — PREDNISONE 50 MG PO TABS
50.0000 mg | ORAL_TABLET | Freq: Every day | ORAL | 0 refills | Status: DC
Start: 2020-10-21 — End: 2022-04-29

## 2020-10-21 MED ORDER — ZINC SULFATE 220 (50 ZN) MG PO CAPS: 220.0000 mg | ORAL_CAPSULE | Freq: Every day | ORAL | Status: DC

## 2020-10-21 MED ORDER — ASCORBIC ACID 500 MG PO TABS: 500.0000 mg | ORAL_TABLET | Freq: Every day | ORAL | Status: DC

## 2020-10-21 NOTE — Care Management Important Message (Signed)
Important Message  Patient Details IM Letter placed in Patient's door caddy. Name: [name] MRN: [id number] Date of Birth: October 23, 1927   Medicare Important Message Given:  Yes     Kerin Salen 10/21/2020, 1:56 PM

## 2020-10-21 NOTE — Plan of Care (Signed)
Problem: Education: Goal: Knowledge of General Education information will improve Description: Including pain rating scale, medication(s)/side effects and non-pharmacologic comfort measures 10/21/2020 1322 by Lennie Hummer, RN Outcome: Adequate for Discharge 10/21/2020 1322 by Lennie Hummer, RN Outcome: Adequate for Discharge 10/21/2020 1217 by Lennie Hummer, RN Outcome: Adequate for Discharge   Problem: Health Behavior/Discharge Planning: Goal: Ability to manage health-related needs will improve 10/21/2020 1322 by Lennie Hummer, RN Outcome: Adequate for Discharge 10/21/2020 1322 by Lennie Hummer, RN Outcome: Adequate for Discharge   Problem: Clinical Measurements: Goal: Ability to maintain clinical measurements within normal limits will improve 10/21/2020 1322 by Lennie Hummer, RN Outcome: Adequate for Discharge 10/21/2020 1322 by Lennie Hummer, RN Outcome: Adequate for Discharge Goal: Will remain free from infection 10/21/2020 1322 by Lennie Hummer, RN Outcome: Adequate for Discharge 10/21/2020 1322 by Lennie Hummer, RN Outcome: Adequate for Discharge Goal: Diagnostic test results will improve 10/21/2020 1322 by Lennie Hummer, RN Outcome: Adequate for Discharge 10/21/2020 1322 by Lennie Hummer, RN Outcome: Adequate for Discharge Goal: Respiratory complications will improve 10/21/2020 1322 by Lennie Hummer, RN Outcome: Adequate for Discharge 10/21/2020 1322 by Lennie Hummer, RN Outcome: Adequate for Discharge Goal: Cardiovascular complication will be avoided 10/21/2020 1322 by Lennie Hummer, RN Outcome: Adequate for Discharge 10/21/2020 1322 by Lennie Hummer, RN Outcome: Adequate for Discharge   Problem: Activity: Goal: Risk for activity intolerance will decrease 10/21/2020 1322 by Lennie Hummer, RN Outcome: Adequate for Discharge 10/21/2020 1322 by Lennie Hummer, RN Outcome: Adequate for Discharge   Problem: Nutrition: Goal: Adequate nutrition  will be maintained 10/21/2020 1322 by Lennie Hummer, RN Outcome: Adequate for Discharge 10/21/2020 1322 by Lennie Hummer, RN Outcome: Adequate for Discharge 10/21/2020 1217 by Lennie Hummer, RN Outcome: Adequate for Discharge   Problem: Coping: Goal: Level of anxiety will decrease 10/21/2020 1322 by Lennie Hummer, RN Outcome: Adequate for Discharge 10/21/2020 1322 by Lennie Hummer, RN Outcome: Adequate for Discharge   Problem: Elimination: Goal: Will not experience complications related to bowel motility 10/21/2020 1322 by Lennie Hummer, RN Outcome: Adequate for Discharge 10/21/2020 1322 by Lennie Hummer, RN Outcome: Adequate for Discharge Goal: Will not experience complications related to urinary retention 10/21/2020 1322 by Lennie Hummer, RN Outcome: Adequate for Discharge 10/21/2020 1322 by Lennie Hummer, RN Outcome: Adequate for Discharge   Problem: Pain Managment: Goal: General experience of comfort will improve 10/21/2020 1322 by Lennie Hummer, RN Outcome: Adequate for Discharge 10/21/2020 1322 by Lennie Hummer, RN Outcome: Adequate for Discharge   Problem: Safety: Goal: Ability to remain free from injury will improve 10/21/2020 1322 by Lennie Hummer, RN Outcome: Adequate for Discharge 10/21/2020 1322 by Lennie Hummer, RN Outcome: Adequate for Discharge 10/21/2020 1217 by Lennie Hummer, RN Outcome: Adequate for Discharge   Problem: Skin Integrity: Goal: Risk for impaired skin integrity will decrease 10/21/2020 1322 by Lennie Hummer, RN Outcome: Adequate for Discharge 10/21/2020 1322 by Lennie Hummer, RN Outcome: Adequate for Discharge   Problem: Education: Goal: Knowledge of risk factors and measures for prevention of condition will improve 10/21/2020 1322 by Lennie Hummer, RN Outcome: Adequate for Discharge 10/21/2020 1322 by Lennie Hummer, RN Outcome: Adequate for Discharge 10/21/2020 1217 by Lennie Hummer, RN Outcome: Adequate for  Discharge   Problem: Coping: Goal: Psychosocial and spiritual needs will be supported 10/21/2020 1322 by Lennie Hummer, RN Outcome: Adequate for Discharge 10/21/2020 1322 by Lennie Hummer, RN Outcome: Adequate for Discharge 10/21/2020 1217 by Lennie Hummer, RN Outcome: Adequate for Discharge   Problem: Respiratory: Goal: Will maintain a patent airway 10/21/2020 1322 by Esmond Plants,  Glenda Chroman, RN Outcome: Adequate for Discharge 10/21/2020 1322 by Lennie Hummer, RN Outcome: Adequate for Discharge 10/21/2020 1217 by Lennie Hummer, RN Outcome: Adequate for Discharge Goal: Complications related to the disease process, condition or treatment will be avoided or minimized 10/21/2020 1322 by Lennie Hummer, RN Outcome: Adequate for Discharge 10/21/2020 1322 by Lennie Hummer, RN Outcome: Adequate for Discharge 10/21/2020 1217 by Lennie Hummer, RN Outcome: Adequate for Discharge

## 2020-10-21 NOTE — Plan of Care (Signed)
  Problem: Education: Goal: Knowledge of General Education information will improve Description: Including pain rating scale, medication(s)/side effects and non-pharmacologic comfort measures Outcome: Adequate for Discharge   Problem: Nutrition: Goal: Adequate nutrition will be maintained Outcome: Adequate for Discharge   Problem: Safety: Goal: Ability to remain free from injury will improve Outcome: Adequate for Discharge   Problem: Education: Goal: Knowledge of risk factors and measures for prevention of condition will improve Outcome: Adequate for Discharge   Problem: Coping: Goal: Psychosocial and spiritual needs will be supported Outcome: Adequate for Discharge   Problem: Respiratory: Goal: Will maintain a patent airway Outcome: Adequate for Discharge Goal: Complications related to the disease process, condition or treatment will be avoided or minimized Outcome: Adequate for Discharge

## 2020-10-21 NOTE — Discharge Summary (Signed)
Physician Discharge Summary  Adnrew Honkala [id number] DOB: 1927-07-28 DOA: 10/18/2020  PCP: Lajean Manes, MD  Admit date: 10/18/2020 Discharge date: 10/21/2020  Admitted From: home Discharge disposition: home   Recommendations for Outpatient Follow-Up:   BMP 1 week   Discharge Diagnosis:   Principal Problem:   COVID-19 virus infection Active Problems:   Benign essential HTN   CKD (chronic kidney disease), stage III ([place])   DM type 2 without retinopathy ([place])   Coronary artery disease involving native coronary artery of native heart without angina pectoris   Hemiparesis affecting right side as late effect of cerebrovascular accident (CVA) ([place])   Hypoxia    Discharge Condition: Improved.  Diet recommendation:  carb mod/heart healthy  Wound care: None.  Code status: DNR   History of Present Illness:   [name] is a 87 y.o. male with medical history significant of DM2, CVA, CKD stage 3b, HTN, HLD, GERD   Presented with 3 days of feeling generally fatigued some weakness chills body aches he did not report any fever he had some nausea and vomiting that started today. Family members are positive for COVID patient tested himself at home and found to be positive as well. Has been having some dry cough some shortness of breath worse with exertion.  But no chest pain no sore throat no headache     Hospital Course by Problem:   COVID-19 virus infection -- Mild hypoxia only has resolved.   -- Was treated with 4 days of Paxlovid as outpatient. - steroids -s/p remdesivir    Hypoxia -- Very mild, not below 90%.  Supportive care.   DM type 2 without retinopathy ([place]) --stable -- resume glimepiride as outpatient   CKD (chronic kidney disease), stage III ([place]) --suspect stage IIIa; creatinine stable -outpatient follow up   Hemiparesis affecting right side as late effect of cerebrovascular accident (CVA) ([place]) --continue Plavix      Medical  Consultants:      Discharge Exam:   Vitals:   10/21/20 0601 10/21/20 0808  BP: (!) 142/71 (!) 156/68  Pulse: 69 75  Resp: 20 20  Temp: 97.9 F (36.6 C) 98 F (36.7 C)  SpO2: 98% 97%   Vitals:   10/20/20 2019 10/21/20 0458 10/21/20 0601 10/21/20 0808  BP: (!) 155/69 140/74 (!) 142/71 (!) 156/68  Pulse: 75 63 69 75  Resp: '18 16 20 20  '$ Temp: 97.6 F (36.4 C) 98.4 F (36.9 C) 97.9 F (36.6 C) 98 F (36.7 C)  TempSrc: Oral Oral Oral Oral  SpO2: 95% 99% 98% 97%  Weight:      Height:        General exam: Appears calm and comfortable.   The results of significant diagnostics from this hospitalization (including imaging, microbiology, ancillary and laboratory) are listed below for reference.     Procedures and Diagnostic Studies:   DG Chest Port 1 View  Result Date: 10/18/2020 CLINICAL DATA:  Cough, COVID, shortness of breath EXAM: PORTABLE CHEST 1 VIEW COMPARISON:  05/10/2020 FINDINGS: Heart and mediastinal contours are within normal limits. No focal opacities or effusions. No acute bony abnormality. IMPRESSION: No active disease. Electronically Signed   By: Rolm Baptise M.D.   On: 10/18/2020 17:30     Labs:   Basic Metabolic Panel: Recent Labs  Lab 10/18/20 1701 10/18/20 1918 10/19/20 0325  NA 138  --  136  K 4.1  --  3.9  CL 102  --  105  CO2 24  --  21*  GLUCOSE 95  --  89  BUN 20  --  20  CREATININE 1.76*  --  1.64*  CALCIUM 9.0  --  8.9  MG  --  1.8 1.9  PHOS  --  2.7 3.2   GFR Estimated Creatinine Clearance: 28.4 mL/min (A) (by C-G formula based on SCr of 1.64 mg/dL (H)). Liver Function Tests: Recent Labs  Lab 10/18/20 1701 10/19/20 0325  AST 24 34  ALT 16 17  ALKPHOS 47 43  BILITOT 0.9 0.9  PROT 7.3 7.0  ALBUMIN 3.8 3.8   No results for input(s): LIPASE, AMYLASE in the last 168 hours. No results for input(s): AMMONIA in the last 168 hours. Coagulation profile No results for input(s): INR, PROTIME in the last 168 hours.  CBC: Recent  Labs  Lab 10/18/20 1701 10/19/20 0325  WBC 10.6* 8.0  NEUTROABS  --  4.9  HGB 14.3 14.5  HCT 44.0 45.3  MCV 92.8 93.6  PLT 207 187   Cardiac Enzymes: Recent Labs  Lab 10/18/20 1918  CKTOTAL 86   BNP: Invalid input(s): POCBNP CBG: Recent Labs  Lab 10/19/20 2152 10/20/20 0807 10/20/20 1122 10/20/20 1703 10/20/20 2017  GLUCAP 234* 177* 171* 167* 241*   D-Dimer Recent Labs    10/20/20 0331 10/21/20 0356  DDIMER 1.12* 0.74*   Hgb A1c Recent Labs    10/18/20 1701  HGBA1C 6.5*   Lipid Profile Recent Labs    10/18/20 1701  TRIG 96   Thyroid function studies Recent Labs    10/19/20 0325  TSH 4.049   Anemia work up Recent Labs    10/18/20 1701 10/19/20 0325  FERRITIN 107 129  117   Microbiology Recent Results (from the past 240 hour(s))  Resp Panel by RT-PCR (Flu A&B, Covid) Nasopharyngeal Swab     Status: Abnormal   Collection Time: 10/18/20  8:10 PM   Specimen: Nasopharyngeal Swab; Nasopharyngeal(NP) swabs in vial transport medium  Result Value Ref Range Status   SARS Coronavirus 2 by RT PCR POSITIVE (A) NEGATIVE Final    Comment: CRITICAL RESULT CALLED TO, READ BACK BY AND VERIFIED WITH: RAFSLAWSKI H. RN @ 2233 BY MECIAL J. (NOTE) SARS-CoV-2 target nucleic acids are DETECTED.  The SARS-CoV-2 RNA is generally detectable in upper respiratory specimens during the acute phase of infection. Positive results are indicative of the presence of the identified virus, but do not rule out bacterial infection or co-infection with other pathogens not detected by the test. Clinical correlation with patient history and other diagnostic information is necessary to determine patient infection status. The expected result is Negative.  Fact Sheet for Patients: EntrepreneurPulse.com.au  Fact Sheet for Healthcare Providers: IncredibleEmployment.be  This test is not yet approved or cleared by the [place] FDA and  has  been authorized for detection and/or diagnosis of SARS-CoV-2 by FDA under an Emergency Use Authorization (EUA).  This EUA will remain in effect (meaning this  test can be used) for the duration of  the COVID-19 declaration under Section 564(b)(1) of the Act, 21 U.S.C. section 360bbb-3(b)(1), unless the authorization is terminated or revoked sooner.     Influenza A by PCR NEGATIVE NEGATIVE Final   Influenza B by PCR NEGATIVE NEGATIVE Final    Comment: (NOTE) The Xpert Xpress SARS-CoV-2/FLU/RSV plus assay is intended as an aid in the diagnosis of influenza from Nasopharyngeal swab specimens and should not be used as a sole basis for treatment. Nasal washings and aspirates  are unacceptable for Xpert Xpress SARS-CoV-2/FLU/RSV testing.  Fact Sheet for Patients: EntrepreneurPulse.com.au  Fact Sheet for Healthcare Providers: IncredibleEmployment.be  This test is not yet approved or cleared by the [place] FDA and has been authorized for detection and/or diagnosis of SARS-CoV-2 by FDA under an Emergency Use Authorization (EUA). This EUA will remain in effect (meaning this test can be used) for the duration of the COVID-19 declaration under Section 564(b)(1) of the Act, 21 U.S.C. section 360bbb-3(b)(1), unless the authorization is terminated or revoked.  Performed at [facility], [place] [address], [place], [place] [address]      Discharge Instructions:   Discharge Instructions     Diet - low sodium heart healthy   Complete by: As directed    Diet Carb Modified   Complete by: As directed    Increase activity slowly   Complete by: As directed       Allergies as of 10/21/2020       Reactions   Cephalexin    Other reaction(s): muscle/knee pain   Glimepiride    Other reaction(s): headache   Hydralazine Nausea Only   Lipitor [atorvastatin]    Muscle pain   Zetia [ezetimibe] Hives        Medication List      STOP taking these medications    aspirin 325 MG tablet   febuxostat 40 MG tablet Commonly known as: ULORIC   ondansetron 4 MG disintegrating tablet Commonly known as: Zofran ODT   pantoprazole 40 MG tablet Commonly known as: PROTONIX   PAXLOVID PO   traMADol 50 MG tablet Commonly known as: ULTRAM   UNABLE TO FIND       TAKE these medications    amLODipine 10 MG tablet Commonly known as: NORVASC Take 1 tablet (10 mg total) by mouth daily.   ascorbic acid 500 MG tablet Commonly known as: VITAMIN C Take 1 tablet (500 mg total) by mouth daily. Start taking on: October 22, 2020   clopidogrel 75 MG tablet Commonly known as: PLAVIX Take 1 tablet (75 mg total) by mouth daily.   glimepiride 2 MG tablet Commonly known as: AMARYL Take 1 tablet (2 mg total) by mouth daily with breakfast.   lisinopril 30 MG tablet Commonly known as: ZESTRIL Take 1 tablet (30 mg total) by mouth daily.   predniSONE 50 MG tablet Commonly known as: DELTASONE Take 1 tablet (50 mg total) by mouth daily with breakfast. What changed:  medication strength how much to take when to take this   rosuvastatin 5 MG tablet Commonly known as: CRESTOR Take 5 mg by mouth once a week. Monday and Friday   zinc sulfate 220 (50 Zn) MG capsule Take 1 capsule (220 mg total) by mouth daily. Start taking on: October 22, 2020          Time coordinating discharge: 35 min  Signed:  Geradine Girt DO  [facility] 10/21/2020, 11:20 AM

## 2020-11-05 DIAGNOSIS — U071 COVID-19: Secondary | ICD-10-CM | POA: Diagnosis not present

## 2020-11-05 DIAGNOSIS — N1832 Chronic kidney disease, stage 3b: Secondary | ICD-10-CM | POA: Diagnosis not present

## 2020-11-05 DIAGNOSIS — R110 Nausea: Secondary | ICD-10-CM | POA: Diagnosis not present

## 2021-01-05 DIAGNOSIS — R54 Age-related physical debility: Secondary | ICD-10-CM | POA: Diagnosis not present

## 2021-01-05 DIAGNOSIS — N1832 Chronic kidney disease, stage 3b: Secondary | ICD-10-CM | POA: Diagnosis not present

## 2021-01-05 DIAGNOSIS — M109 Gout, unspecified: Secondary | ICD-10-CM | POA: Diagnosis not present

## 2021-01-05 DIAGNOSIS — Z79899 Other long term (current) drug therapy: Secondary | ICD-10-CM | POA: Diagnosis not present

## 2021-01-05 DIAGNOSIS — R260 Ataxic gait: Secondary | ICD-10-CM | POA: Diagnosis not present

## 2021-01-19 DIAGNOSIS — Z0000 Encounter for general adult medical examination without abnormal findings: Secondary | ICD-10-CM | POA: Diagnosis not present

## 2021-01-19 DIAGNOSIS — N1832 Chronic kidney disease, stage 3b: Secondary | ICD-10-CM | POA: Diagnosis not present

## 2021-01-19 DIAGNOSIS — H3530 Unspecified macular degeneration: Secondary | ICD-10-CM | POA: Diagnosis not present

## 2021-01-19 DIAGNOSIS — E1122 Type 2 diabetes mellitus with diabetic chronic kidney disease: Secondary | ICD-10-CM | POA: Diagnosis not present

## 2021-01-19 DIAGNOSIS — Z1389 Encounter for screening for other disorder: Secondary | ICD-10-CM | POA: Diagnosis not present

## 2021-01-19 DIAGNOSIS — R260 Ataxic gait: Secondary | ICD-10-CM | POA: Diagnosis not present

## 2021-01-19 DIAGNOSIS — M109 Gout, unspecified: Secondary | ICD-10-CM | POA: Diagnosis not present

## 2021-01-19 DIAGNOSIS — I129 Hypertensive chronic kidney disease with stage 1 through stage 4 chronic kidney disease, or unspecified chronic kidney disease: Secondary | ICD-10-CM | POA: Diagnosis not present

## 2021-01-19 DIAGNOSIS — R110 Nausea: Secondary | ICD-10-CM | POA: Diagnosis not present

## 2021-01-19 DIAGNOSIS — K760 Fatty (change of) liver, not elsewhere classified: Secondary | ICD-10-CM | POA: Diagnosis not present

## 2021-01-19 DIAGNOSIS — E1151 Type 2 diabetes mellitus with diabetic peripheral angiopathy without gangrene: Secondary | ICD-10-CM | POA: Diagnosis not present

## 2021-01-19 DIAGNOSIS — E7800 Pure hypercholesterolemia, unspecified: Secondary | ICD-10-CM | POA: Diagnosis not present

## 2021-04-09 DIAGNOSIS — G629 Polyneuropathy, unspecified: Secondary | ICD-10-CM | POA: Diagnosis not present

## 2021-04-09 DIAGNOSIS — E1151 Type 2 diabetes mellitus with diabetic peripheral angiopathy without gangrene: Secondary | ICD-10-CM | POA: Diagnosis not present

## 2021-07-27 IMAGING — CT CT HEAD W/O CM
3 series · 14 of 47 positions shown, 16 images · non-contrast
Comparison: October 15, 2018

CLINICAL DATA: TIA symptoms

EXAM:
CT HEAD WITHOUT CONTRAST
TECHNIQUE: Contiguous axial images were obtained from the base of the skull
through the vertex without intravenous contrast.

[Series 2: head wo · axial · 0.47mm/px · z∈[-77,+53]mm · 8 of 32 slices shown, 10 images]
[im 3/32  brain]
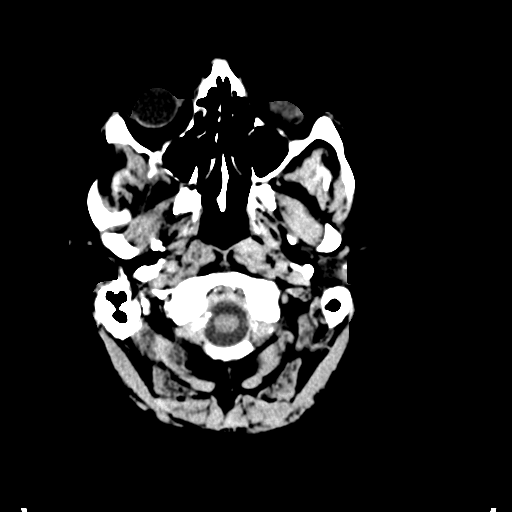
[im 3/32  bone]
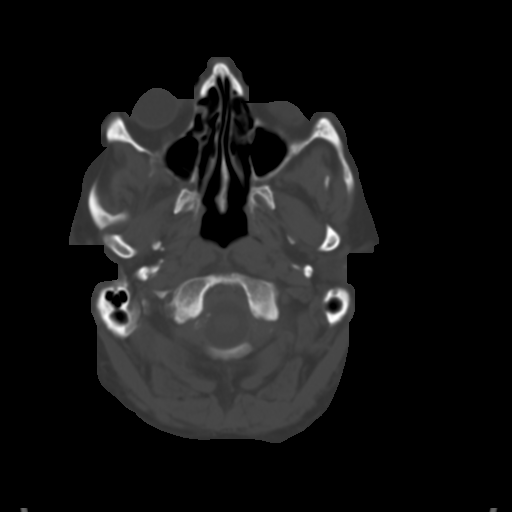
[im 7/32  brain]
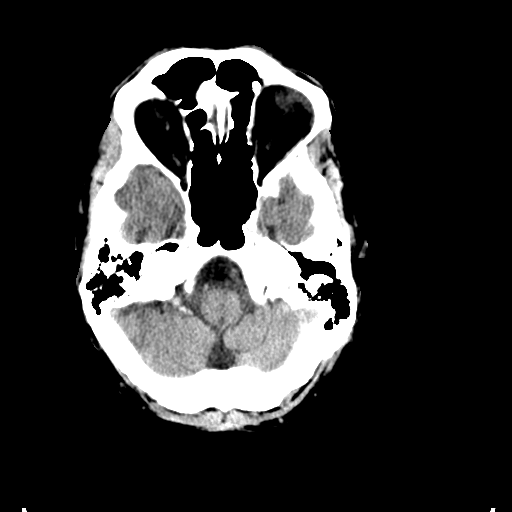
[im 10/32  brain]
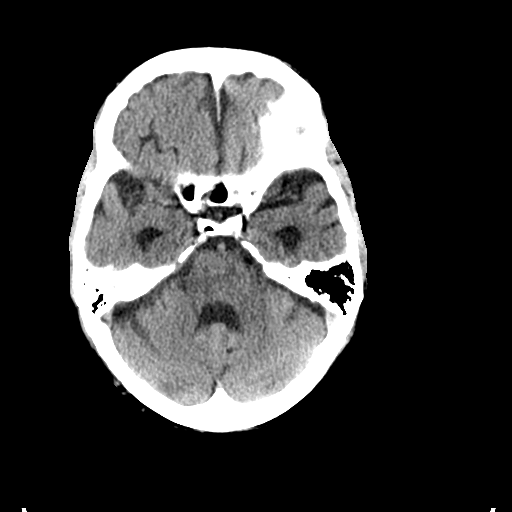
[im 14/32  brain]
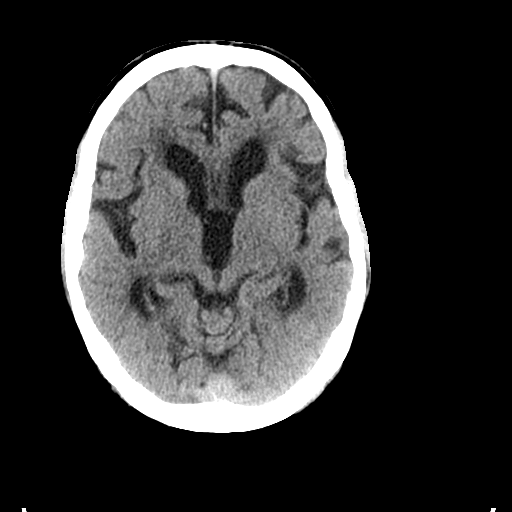
[im 18/32  brain]
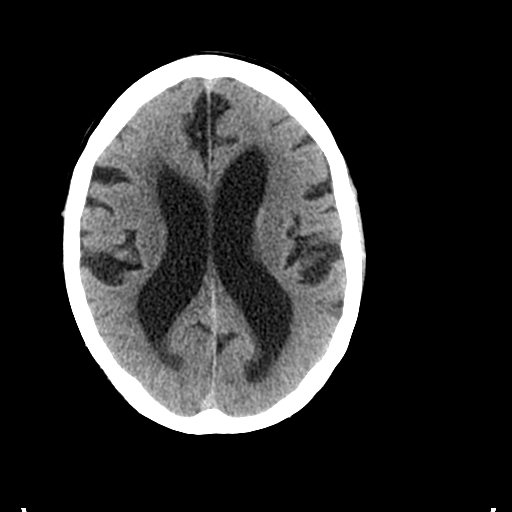
[im 18/32  bone]
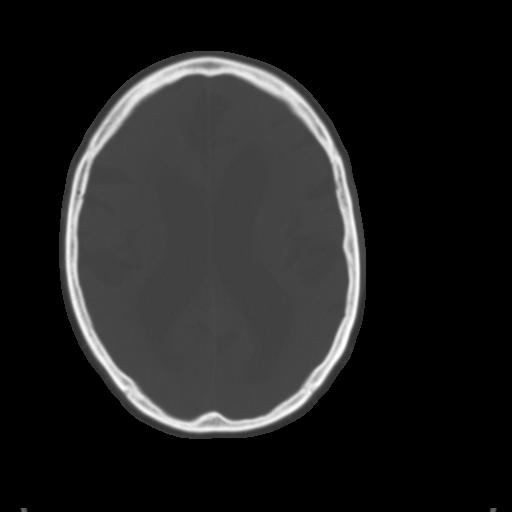
[im 22/32  brain]
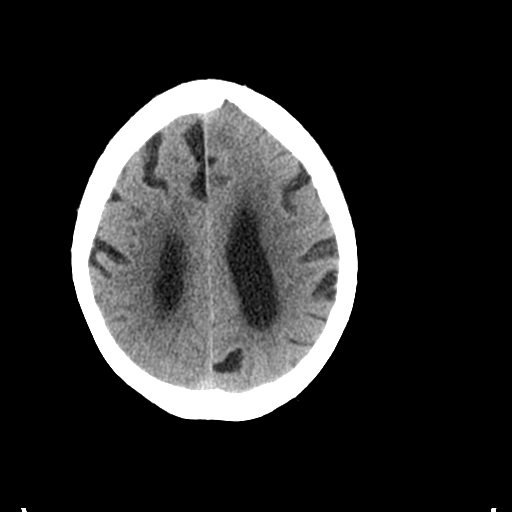
[im 25/32  brain]
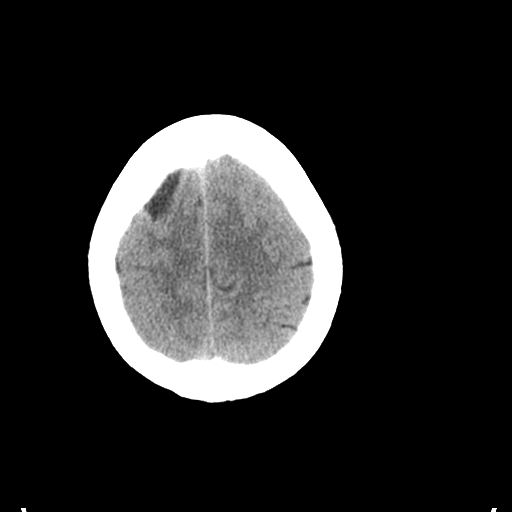
[im 29/32  brain]
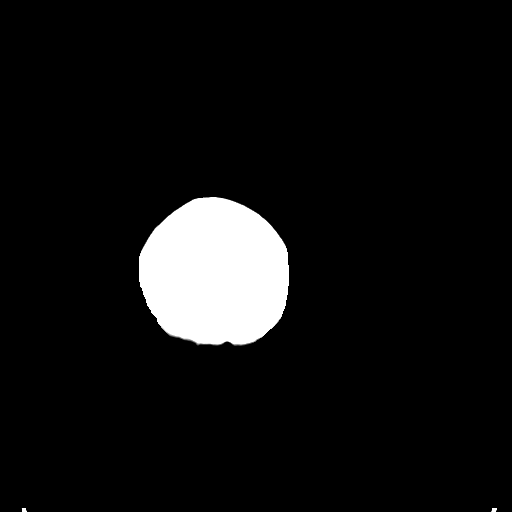

[Series 5: coronal soft tissue · coronal · 0.30mm/px · 3 of 69 slices shown]
[im 23/69  brain]
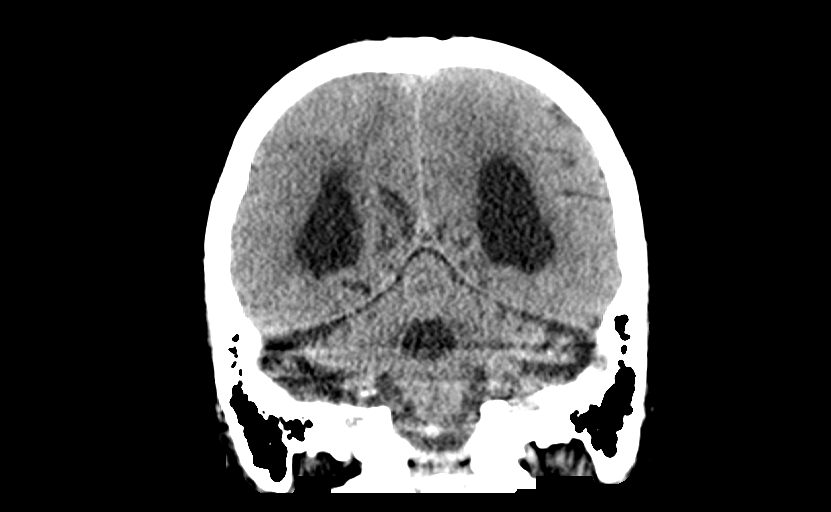
[im 31/69  brain]
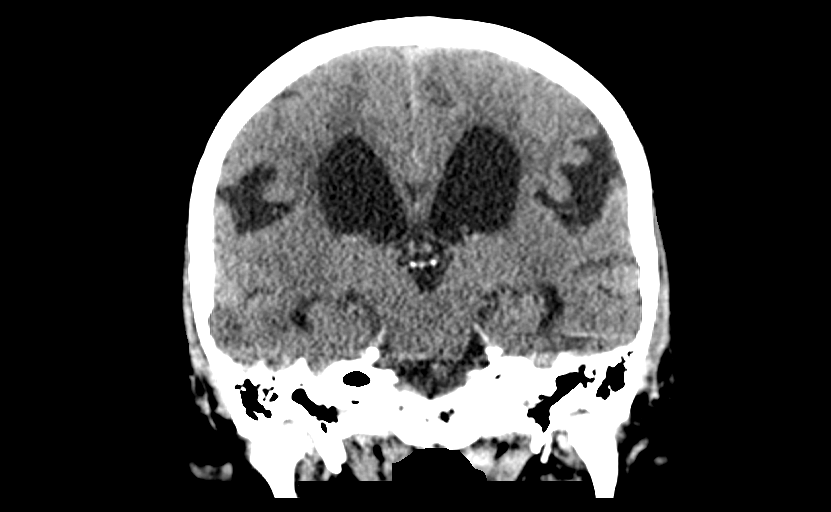
[im 38/69  brain]
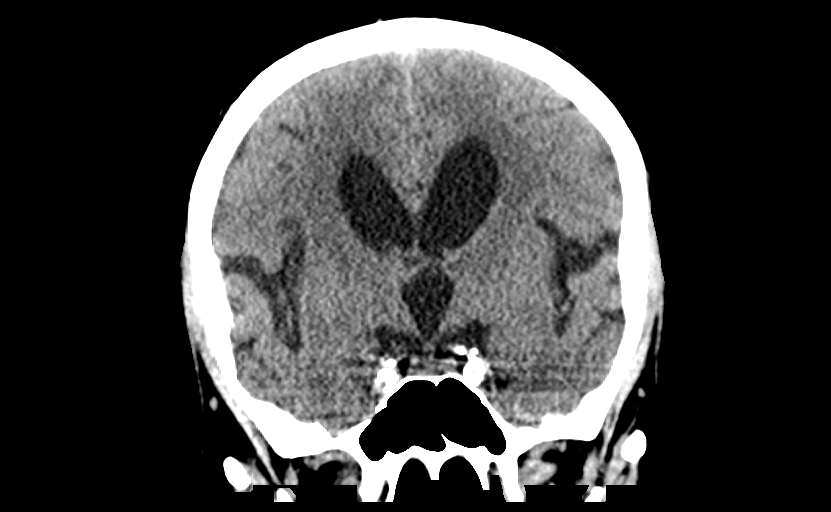

[Series 6: sagittal soft tissue · sagittal · 0.35mm/px · 3 of 56 slices shown]
[im 19/56  brain]
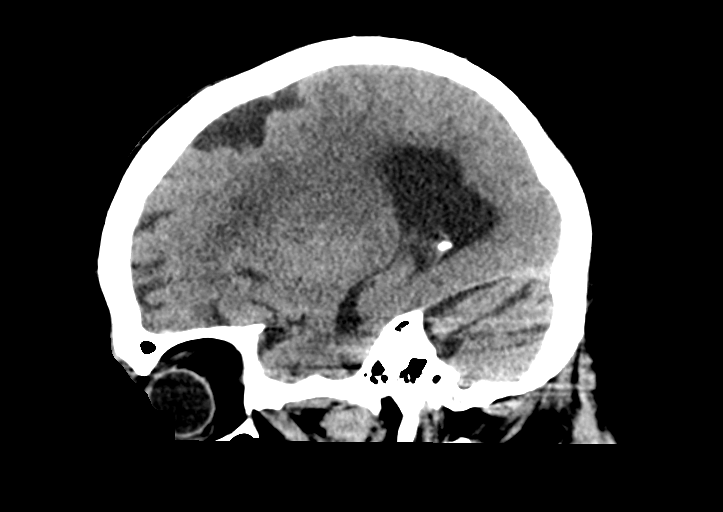
[im 28/56  brain]
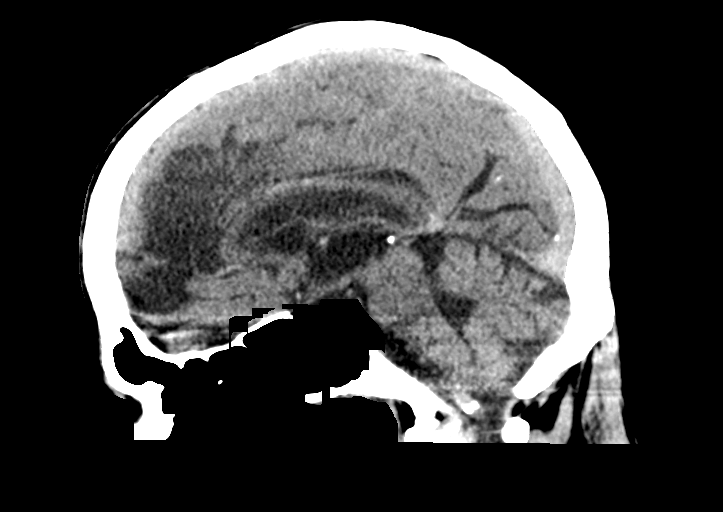
[im 37/56  brain]
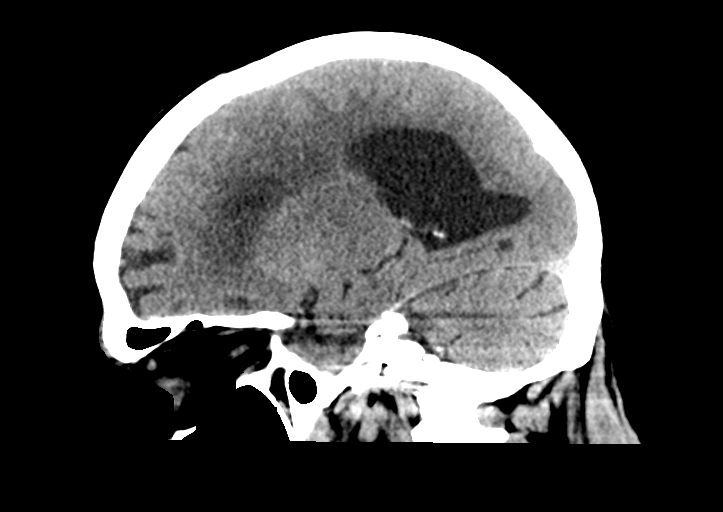

[14 of 47 positions shown; findings below may reference images not displayed]

FINDINGS: Brain: Moderate diffuse atrophy is stable. There is no intracranial
mass, hemorrhage, extra-axial fluid collection, or midline shift.
There is decreased attenuation in the centra semiovale bilaterally,
stable. There is a focal area of decreased attenuation in the left
lower pons, consistent with a prior small infarct in this area. No
acute appearing infarct evident.

Vascular: No hyperdense vessel. Foci of calcification noted in each
distal vertebral artery and carotid siphon region.

Skull: The bony calvarium appears intact.

Sinuses/Orbits: Visualized paranasal sinuses are clear. Visualized
orbits appear symmetric bilaterally.

Other: Mastoid air cells are clear.
IMPRESSION: Stable atrophy with periventricular small vessel disease. Prior
small infarct inferior left pons. No acute infarct evident. No mass
or hemorrhage.

There are multiple foci of arterial vascular calcification.

## 2021-08-20 DIAGNOSIS — M109 Gout, unspecified: Secondary | ICD-10-CM | POA: Diagnosis not present

## 2021-09-08 DIAGNOSIS — R54 Age-related physical debility: Secondary | ICD-10-CM | POA: Diagnosis not present

## 2021-09-08 DIAGNOSIS — R260 Ataxic gait: Secondary | ICD-10-CM | POA: Diagnosis not present

## 2021-09-08 DIAGNOSIS — E1151 Type 2 diabetes mellitus with diabetic peripheral angiopathy without gangrene: Secondary | ICD-10-CM | POA: Diagnosis not present

## 2021-09-08 DIAGNOSIS — I129 Hypertensive chronic kidney disease with stage 1 through stage 4 chronic kidney disease, or unspecified chronic kidney disease: Secondary | ICD-10-CM | POA: Diagnosis not present

## 2021-09-08 DIAGNOSIS — R42 Dizziness and giddiness: Secondary | ICD-10-CM | POA: Diagnosis not present

## 2021-09-08 DIAGNOSIS — N1832 Chronic kidney disease, stage 3b: Secondary | ICD-10-CM | POA: Diagnosis not present

## 2021-09-22 DIAGNOSIS — M2550 Pain in unspecified joint: Secondary | ICD-10-CM | POA: Diagnosis not present

## 2021-09-22 DIAGNOSIS — R110 Nausea: Secondary | ICD-10-CM | POA: Diagnosis not present

## 2021-09-22 DIAGNOSIS — Z7409 Other reduced mobility: Secondary | ICD-10-CM | POA: Diagnosis not present

## 2021-09-22 DIAGNOSIS — N1832 Chronic kidney disease, stage 3b: Secondary | ICD-10-CM | POA: Diagnosis not present

## 2021-09-22 DIAGNOSIS — G629 Polyneuropathy, unspecified: Secondary | ICD-10-CM | POA: Diagnosis not present

## 2021-09-22 DIAGNOSIS — R54 Age-related physical debility: Secondary | ICD-10-CM | POA: Diagnosis not present

## 2021-09-24 DIAGNOSIS — E538 Deficiency of other specified B group vitamins: Secondary | ICD-10-CM | POA: Diagnosis not present

## 2021-09-24 DIAGNOSIS — Z8616 Personal history of COVID-19: Secondary | ICD-10-CM | POA: Diagnosis not present

## 2021-09-24 DIAGNOSIS — R260 Ataxic gait: Secondary | ICD-10-CM | POA: Diagnosis not present

## 2021-09-24 DIAGNOSIS — Z86711 Personal history of pulmonary embolism: Secondary | ICD-10-CM | POA: Diagnosis not present

## 2021-09-24 DIAGNOSIS — H3530 Unspecified macular degeneration: Secondary | ICD-10-CM | POA: Diagnosis not present

## 2021-09-24 DIAGNOSIS — K760 Fatty (change of) liver, not elsewhere classified: Secondary | ICD-10-CM | POA: Diagnosis not present

## 2021-09-24 DIAGNOSIS — Z7984 Long term (current) use of oral hypoglycemic drugs: Secondary | ICD-10-CM | POA: Diagnosis not present

## 2021-09-24 DIAGNOSIS — M109 Gout, unspecified: Secondary | ICD-10-CM | POA: Diagnosis not present

## 2021-09-24 DIAGNOSIS — E7800 Pure hypercholesterolemia, unspecified: Secondary | ICD-10-CM | POA: Diagnosis not present

## 2021-09-24 DIAGNOSIS — I129 Hypertensive chronic kidney disease with stage 1 through stage 4 chronic kidney disease, or unspecified chronic kidney disease: Secondary | ICD-10-CM | POA: Diagnosis not present

## 2021-09-24 DIAGNOSIS — N1832 Chronic kidney disease, stage 3b: Secondary | ICD-10-CM | POA: Diagnosis not present

## 2021-09-24 DIAGNOSIS — R54 Age-related physical debility: Secondary | ICD-10-CM | POA: Diagnosis not present

## 2021-09-24 DIAGNOSIS — E1142 Type 2 diabetes mellitus with diabetic polyneuropathy: Secondary | ICD-10-CM | POA: Diagnosis not present

## 2021-09-24 DIAGNOSIS — E1122 Type 2 diabetes mellitus with diabetic chronic kidney disease: Secondary | ICD-10-CM | POA: Diagnosis not present

## 2021-09-24 DIAGNOSIS — I2510 Atherosclerotic heart disease of native coronary artery without angina pectoris: Secondary | ICD-10-CM | POA: Diagnosis not present

## 2021-09-24 DIAGNOSIS — E1151 Type 2 diabetes mellitus with diabetic peripheral angiopathy without gangrene: Secondary | ICD-10-CM | POA: Diagnosis not present

## 2021-09-27 DIAGNOSIS — R058 Other specified cough: Secondary | ICD-10-CM | POA: Diagnosis not present

## 2021-09-30 DIAGNOSIS — R54 Age-related physical debility: Secondary | ICD-10-CM | POA: Diagnosis not present

## 2021-09-30 DIAGNOSIS — R260 Ataxic gait: Secondary | ICD-10-CM | POA: Diagnosis not present

## 2021-09-30 DIAGNOSIS — I69398 Other sequelae of cerebral infarction: Secondary | ICD-10-CM | POA: Diagnosis not present

## 2021-09-30 DIAGNOSIS — E538 Deficiency of other specified B group vitamins: Secondary | ICD-10-CM | POA: Diagnosis not present

## 2021-09-30 DIAGNOSIS — Z7984 Long term (current) use of oral hypoglycemic drugs: Secondary | ICD-10-CM | POA: Diagnosis not present

## 2021-09-30 DIAGNOSIS — I2510 Atherosclerotic heart disease of native coronary artery without angina pectoris: Secondary | ICD-10-CM | POA: Diagnosis not present

## 2021-09-30 DIAGNOSIS — E1142 Type 2 diabetes mellitus with diabetic polyneuropathy: Secondary | ICD-10-CM | POA: Diagnosis not present

## 2021-09-30 DIAGNOSIS — E1122 Type 2 diabetes mellitus with diabetic chronic kidney disease: Secondary | ICD-10-CM | POA: Diagnosis not present

## 2021-09-30 DIAGNOSIS — I129 Hypertensive chronic kidney disease with stage 1 through stage 4 chronic kidney disease, or unspecified chronic kidney disease: Secondary | ICD-10-CM | POA: Diagnosis not present

## 2021-09-30 DIAGNOSIS — K760 Fatty (change of) liver, not elsewhere classified: Secondary | ICD-10-CM | POA: Diagnosis not present

## 2021-09-30 DIAGNOSIS — H3530 Unspecified macular degeneration: Secondary | ICD-10-CM | POA: Diagnosis not present

## 2021-09-30 DIAGNOSIS — N1832 Chronic kidney disease, stage 3b: Secondary | ICD-10-CM | POA: Diagnosis not present

## 2021-09-30 DIAGNOSIS — M109 Gout, unspecified: Secondary | ICD-10-CM | POA: Diagnosis not present

## 2021-09-30 DIAGNOSIS — E7800 Pure hypercholesterolemia, unspecified: Secondary | ICD-10-CM | POA: Diagnosis not present

## 2021-09-30 DIAGNOSIS — E1151 Type 2 diabetes mellitus with diabetic peripheral angiopathy without gangrene: Secondary | ICD-10-CM | POA: Diagnosis not present

## 2021-09-30 DIAGNOSIS — Z86711 Personal history of pulmonary embolism: Secondary | ICD-10-CM | POA: Diagnosis not present

## 2021-09-30 DIAGNOSIS — Z8616 Personal history of COVID-19: Secondary | ICD-10-CM | POA: Diagnosis not present

## 2021-10-01 DIAGNOSIS — N1832 Chronic kidney disease, stage 3b: Secondary | ICD-10-CM | POA: Diagnosis not present

## 2021-10-01 DIAGNOSIS — I2510 Atherosclerotic heart disease of native coronary artery without angina pectoris: Secondary | ICD-10-CM | POA: Diagnosis not present

## 2021-10-01 DIAGNOSIS — R260 Ataxic gait: Secondary | ICD-10-CM | POA: Diagnosis not present

## 2021-10-01 DIAGNOSIS — E538 Deficiency of other specified B group vitamins: Secondary | ICD-10-CM | POA: Diagnosis not present

## 2021-10-01 DIAGNOSIS — M109 Gout, unspecified: Secondary | ICD-10-CM | POA: Diagnosis not present

## 2021-10-01 DIAGNOSIS — Z8616 Personal history of COVID-19: Secondary | ICD-10-CM | POA: Diagnosis not present

## 2021-10-01 DIAGNOSIS — E7800 Pure hypercholesterolemia, unspecified: Secondary | ICD-10-CM | POA: Diagnosis not present

## 2021-10-01 DIAGNOSIS — Z86711 Personal history of pulmonary embolism: Secondary | ICD-10-CM | POA: Diagnosis not present

## 2021-10-01 DIAGNOSIS — K760 Fatty (change of) liver, not elsewhere classified: Secondary | ICD-10-CM | POA: Diagnosis not present

## 2021-10-01 DIAGNOSIS — I69398 Other sequelae of cerebral infarction: Secondary | ICD-10-CM | POA: Diagnosis not present

## 2021-10-01 DIAGNOSIS — E1142 Type 2 diabetes mellitus with diabetic polyneuropathy: Secondary | ICD-10-CM | POA: Diagnosis not present

## 2021-10-01 DIAGNOSIS — H3530 Unspecified macular degeneration: Secondary | ICD-10-CM | POA: Diagnosis not present

## 2021-10-01 DIAGNOSIS — E1151 Type 2 diabetes mellitus with diabetic peripheral angiopathy without gangrene: Secondary | ICD-10-CM | POA: Diagnosis not present

## 2021-10-01 DIAGNOSIS — I129 Hypertensive chronic kidney disease with stage 1 through stage 4 chronic kidney disease, or unspecified chronic kidney disease: Secondary | ICD-10-CM | POA: Diagnosis not present

## 2021-10-01 DIAGNOSIS — Z7984 Long term (current) use of oral hypoglycemic drugs: Secondary | ICD-10-CM | POA: Diagnosis not present

## 2021-10-01 DIAGNOSIS — E1122 Type 2 diabetes mellitus with diabetic chronic kidney disease: Secondary | ICD-10-CM | POA: Diagnosis not present

## 2021-10-01 DIAGNOSIS — R54 Age-related physical debility: Secondary | ICD-10-CM | POA: Diagnosis not present

## 2021-10-05 DIAGNOSIS — R54 Age-related physical debility: Secondary | ICD-10-CM | POA: Diagnosis not present

## 2021-10-05 DIAGNOSIS — I69398 Other sequelae of cerebral infarction: Secondary | ICD-10-CM | POA: Diagnosis not present

## 2021-10-05 DIAGNOSIS — E1151 Type 2 diabetes mellitus with diabetic peripheral angiopathy without gangrene: Secondary | ICD-10-CM | POA: Diagnosis not present

## 2021-10-05 DIAGNOSIS — Z86711 Personal history of pulmonary embolism: Secondary | ICD-10-CM | POA: Diagnosis not present

## 2021-10-05 DIAGNOSIS — E538 Deficiency of other specified B group vitamins: Secondary | ICD-10-CM | POA: Diagnosis not present

## 2021-10-05 DIAGNOSIS — E1122 Type 2 diabetes mellitus with diabetic chronic kidney disease: Secondary | ICD-10-CM | POA: Diagnosis not present

## 2021-10-05 DIAGNOSIS — E7800 Pure hypercholesterolemia, unspecified: Secondary | ICD-10-CM | POA: Diagnosis not present

## 2021-10-05 DIAGNOSIS — I129 Hypertensive chronic kidney disease with stage 1 through stage 4 chronic kidney disease, or unspecified chronic kidney disease: Secondary | ICD-10-CM | POA: Diagnosis not present

## 2021-10-05 DIAGNOSIS — K760 Fatty (change of) liver, not elsewhere classified: Secondary | ICD-10-CM | POA: Diagnosis not present

## 2021-10-05 DIAGNOSIS — H3530 Unspecified macular degeneration: Secondary | ICD-10-CM | POA: Diagnosis not present

## 2021-10-05 DIAGNOSIS — R260 Ataxic gait: Secondary | ICD-10-CM | POA: Diagnosis not present

## 2021-10-05 DIAGNOSIS — N1832 Chronic kidney disease, stage 3b: Secondary | ICD-10-CM | POA: Diagnosis not present

## 2021-10-05 DIAGNOSIS — E1142 Type 2 diabetes mellitus with diabetic polyneuropathy: Secondary | ICD-10-CM | POA: Diagnosis not present

## 2021-10-05 DIAGNOSIS — M109 Gout, unspecified: Secondary | ICD-10-CM | POA: Diagnosis not present

## 2021-10-05 DIAGNOSIS — Z8616 Personal history of COVID-19: Secondary | ICD-10-CM | POA: Diagnosis not present

## 2021-10-05 DIAGNOSIS — Z7984 Long term (current) use of oral hypoglycemic drugs: Secondary | ICD-10-CM | POA: Diagnosis not present

## 2021-10-05 DIAGNOSIS — I2510 Atherosclerotic heart disease of native coronary artery without angina pectoris: Secondary | ICD-10-CM | POA: Diagnosis not present

## 2021-10-07 DIAGNOSIS — E1142 Type 2 diabetes mellitus with diabetic polyneuropathy: Secondary | ICD-10-CM | POA: Diagnosis not present

## 2021-10-07 DIAGNOSIS — I129 Hypertensive chronic kidney disease with stage 1 through stage 4 chronic kidney disease, or unspecified chronic kidney disease: Secondary | ICD-10-CM | POA: Diagnosis not present

## 2021-10-07 DIAGNOSIS — Z86711 Personal history of pulmonary embolism: Secondary | ICD-10-CM | POA: Diagnosis not present

## 2021-10-07 DIAGNOSIS — E538 Deficiency of other specified B group vitamins: Secondary | ICD-10-CM | POA: Diagnosis not present

## 2021-10-07 DIAGNOSIS — E1122 Type 2 diabetes mellitus with diabetic chronic kidney disease: Secondary | ICD-10-CM | POA: Diagnosis not present

## 2021-10-07 DIAGNOSIS — N1832 Chronic kidney disease, stage 3b: Secondary | ICD-10-CM | POA: Diagnosis not present

## 2021-10-07 DIAGNOSIS — H3530 Unspecified macular degeneration: Secondary | ICD-10-CM | POA: Diagnosis not present

## 2021-10-07 DIAGNOSIS — I2510 Atherosclerotic heart disease of native coronary artery without angina pectoris: Secondary | ICD-10-CM | POA: Diagnosis not present

## 2021-10-07 DIAGNOSIS — M109 Gout, unspecified: Secondary | ICD-10-CM | POA: Diagnosis not present

## 2021-10-07 DIAGNOSIS — E7800 Pure hypercholesterolemia, unspecified: Secondary | ICD-10-CM | POA: Diagnosis not present

## 2021-10-07 DIAGNOSIS — Z7984 Long term (current) use of oral hypoglycemic drugs: Secondary | ICD-10-CM | POA: Diagnosis not present

## 2021-10-07 DIAGNOSIS — I69398 Other sequelae of cerebral infarction: Secondary | ICD-10-CM | POA: Diagnosis not present

## 2021-10-07 DIAGNOSIS — E1151 Type 2 diabetes mellitus with diabetic peripheral angiopathy without gangrene: Secondary | ICD-10-CM | POA: Diagnosis not present

## 2021-10-07 DIAGNOSIS — Z8616 Personal history of COVID-19: Secondary | ICD-10-CM | POA: Diagnosis not present

## 2021-10-07 DIAGNOSIS — K760 Fatty (change of) liver, not elsewhere classified: Secondary | ICD-10-CM | POA: Diagnosis not present

## 2021-10-07 DIAGNOSIS — R260 Ataxic gait: Secondary | ICD-10-CM | POA: Diagnosis not present

## 2021-10-07 DIAGNOSIS — R54 Age-related physical debility: Secondary | ICD-10-CM | POA: Diagnosis not present

## 2021-10-08 DIAGNOSIS — I69398 Other sequelae of cerebral infarction: Secondary | ICD-10-CM | POA: Diagnosis not present

## 2021-10-08 DIAGNOSIS — Z8616 Personal history of COVID-19: Secondary | ICD-10-CM | POA: Diagnosis not present

## 2021-10-08 DIAGNOSIS — R54 Age-related physical debility: Secondary | ICD-10-CM | POA: Diagnosis not present

## 2021-10-08 DIAGNOSIS — I2510 Atherosclerotic heart disease of native coronary artery without angina pectoris: Secondary | ICD-10-CM | POA: Diagnosis not present

## 2021-10-08 DIAGNOSIS — N1832 Chronic kidney disease, stage 3b: Secondary | ICD-10-CM | POA: Diagnosis not present

## 2021-10-08 DIAGNOSIS — E538 Deficiency of other specified B group vitamins: Secondary | ICD-10-CM | POA: Diagnosis not present

## 2021-10-08 DIAGNOSIS — E1151 Type 2 diabetes mellitus with diabetic peripheral angiopathy without gangrene: Secondary | ICD-10-CM | POA: Diagnosis not present

## 2021-10-08 DIAGNOSIS — E1142 Type 2 diabetes mellitus with diabetic polyneuropathy: Secondary | ICD-10-CM | POA: Diagnosis not present

## 2021-10-08 DIAGNOSIS — Z7984 Long term (current) use of oral hypoglycemic drugs: Secondary | ICD-10-CM | POA: Diagnosis not present

## 2021-10-08 DIAGNOSIS — Z86711 Personal history of pulmonary embolism: Secondary | ICD-10-CM | POA: Diagnosis not present

## 2021-10-08 DIAGNOSIS — H3530 Unspecified macular degeneration: Secondary | ICD-10-CM | POA: Diagnosis not present

## 2021-10-08 DIAGNOSIS — K760 Fatty (change of) liver, not elsewhere classified: Secondary | ICD-10-CM | POA: Diagnosis not present

## 2021-10-08 DIAGNOSIS — E1122 Type 2 diabetes mellitus with diabetic chronic kidney disease: Secondary | ICD-10-CM | POA: Diagnosis not present

## 2021-10-08 DIAGNOSIS — M109 Gout, unspecified: Secondary | ICD-10-CM | POA: Diagnosis not present

## 2021-10-08 DIAGNOSIS — E7800 Pure hypercholesterolemia, unspecified: Secondary | ICD-10-CM | POA: Diagnosis not present

## 2021-10-08 DIAGNOSIS — R260 Ataxic gait: Secondary | ICD-10-CM | POA: Diagnosis not present

## 2021-10-08 DIAGNOSIS — I129 Hypertensive chronic kidney disease with stage 1 through stage 4 chronic kidney disease, or unspecified chronic kidney disease: Secondary | ICD-10-CM | POA: Diagnosis not present

## 2021-10-13 DIAGNOSIS — I69398 Other sequelae of cerebral infarction: Secondary | ICD-10-CM | POA: Diagnosis not present

## 2021-10-13 DIAGNOSIS — Z8616 Personal history of COVID-19: Secondary | ICD-10-CM | POA: Diagnosis not present

## 2021-10-13 DIAGNOSIS — H3530 Unspecified macular degeneration: Secondary | ICD-10-CM | POA: Diagnosis not present

## 2021-10-13 DIAGNOSIS — Z86711 Personal history of pulmonary embolism: Secondary | ICD-10-CM | POA: Diagnosis not present

## 2021-10-13 DIAGNOSIS — R54 Age-related physical debility: Secondary | ICD-10-CM | POA: Diagnosis not present

## 2021-10-13 DIAGNOSIS — I2510 Atherosclerotic heart disease of native coronary artery without angina pectoris: Secondary | ICD-10-CM | POA: Diagnosis not present

## 2021-10-13 DIAGNOSIS — Z7984 Long term (current) use of oral hypoglycemic drugs: Secondary | ICD-10-CM | POA: Diagnosis not present

## 2021-10-13 DIAGNOSIS — M109 Gout, unspecified: Secondary | ICD-10-CM | POA: Diagnosis not present

## 2021-10-13 DIAGNOSIS — R260 Ataxic gait: Secondary | ICD-10-CM | POA: Diagnosis not present

## 2021-10-13 DIAGNOSIS — E1151 Type 2 diabetes mellitus with diabetic peripheral angiopathy without gangrene: Secondary | ICD-10-CM | POA: Diagnosis not present

## 2021-10-13 DIAGNOSIS — E538 Deficiency of other specified B group vitamins: Secondary | ICD-10-CM | POA: Diagnosis not present

## 2021-10-13 DIAGNOSIS — E1122 Type 2 diabetes mellitus with diabetic chronic kidney disease: Secondary | ICD-10-CM | POA: Diagnosis not present

## 2021-10-13 DIAGNOSIS — K760 Fatty (change of) liver, not elsewhere classified: Secondary | ICD-10-CM | POA: Diagnosis not present

## 2021-10-13 DIAGNOSIS — E1142 Type 2 diabetes mellitus with diabetic polyneuropathy: Secondary | ICD-10-CM | POA: Diagnosis not present

## 2021-10-13 DIAGNOSIS — E7800 Pure hypercholesterolemia, unspecified: Secondary | ICD-10-CM | POA: Diagnosis not present

## 2021-10-13 DIAGNOSIS — I129 Hypertensive chronic kidney disease with stage 1 through stage 4 chronic kidney disease, or unspecified chronic kidney disease: Secondary | ICD-10-CM | POA: Diagnosis not present

## 2021-10-13 DIAGNOSIS — N1832 Chronic kidney disease, stage 3b: Secondary | ICD-10-CM | POA: Diagnosis not present

## 2021-10-20 DIAGNOSIS — R260 Ataxic gait: Secondary | ICD-10-CM | POA: Diagnosis not present

## 2021-10-20 DIAGNOSIS — M109 Gout, unspecified: Secondary | ICD-10-CM | POA: Diagnosis not present

## 2021-10-20 DIAGNOSIS — I129 Hypertensive chronic kidney disease with stage 1 through stage 4 chronic kidney disease, or unspecified chronic kidney disease: Secondary | ICD-10-CM | POA: Diagnosis not present

## 2021-10-20 DIAGNOSIS — N1832 Chronic kidney disease, stage 3b: Secondary | ICD-10-CM | POA: Diagnosis not present

## 2021-10-20 DIAGNOSIS — Z7984 Long term (current) use of oral hypoglycemic drugs: Secondary | ICD-10-CM | POA: Diagnosis not present

## 2021-10-20 DIAGNOSIS — R54 Age-related physical debility: Secondary | ICD-10-CM | POA: Diagnosis not present

## 2021-10-20 DIAGNOSIS — I2510 Atherosclerotic heart disease of native coronary artery without angina pectoris: Secondary | ICD-10-CM | POA: Diagnosis not present

## 2021-10-20 DIAGNOSIS — E538 Deficiency of other specified B group vitamins: Secondary | ICD-10-CM | POA: Diagnosis not present

## 2021-10-20 DIAGNOSIS — Z8616 Personal history of COVID-19: Secondary | ICD-10-CM | POA: Diagnosis not present

## 2021-10-20 DIAGNOSIS — I69398 Other sequelae of cerebral infarction: Secondary | ICD-10-CM | POA: Diagnosis not present

## 2021-10-20 DIAGNOSIS — E7800 Pure hypercholesterolemia, unspecified: Secondary | ICD-10-CM | POA: Diagnosis not present

## 2021-10-20 DIAGNOSIS — H3530 Unspecified macular degeneration: Secondary | ICD-10-CM | POA: Diagnosis not present

## 2021-10-20 DIAGNOSIS — K760 Fatty (change of) liver, not elsewhere classified: Secondary | ICD-10-CM | POA: Diagnosis not present

## 2021-10-20 DIAGNOSIS — Z86711 Personal history of pulmonary embolism: Secondary | ICD-10-CM | POA: Diagnosis not present

## 2021-10-20 DIAGNOSIS — E1142 Type 2 diabetes mellitus with diabetic polyneuropathy: Secondary | ICD-10-CM | POA: Diagnosis not present

## 2021-10-20 DIAGNOSIS — E1151 Type 2 diabetes mellitus with diabetic peripheral angiopathy without gangrene: Secondary | ICD-10-CM | POA: Diagnosis not present

## 2021-10-20 DIAGNOSIS — E1122 Type 2 diabetes mellitus with diabetic chronic kidney disease: Secondary | ICD-10-CM | POA: Diagnosis not present

## 2021-10-26 DIAGNOSIS — N1832 Chronic kidney disease, stage 3b: Secondary | ICD-10-CM | POA: Diagnosis not present

## 2021-10-26 DIAGNOSIS — I2510 Atherosclerotic heart disease of native coronary artery without angina pectoris: Secondary | ICD-10-CM | POA: Diagnosis not present

## 2021-10-26 DIAGNOSIS — E1142 Type 2 diabetes mellitus with diabetic polyneuropathy: Secondary | ICD-10-CM | POA: Diagnosis not present

## 2021-10-26 DIAGNOSIS — I129 Hypertensive chronic kidney disease with stage 1 through stage 4 chronic kidney disease, or unspecified chronic kidney disease: Secondary | ICD-10-CM | POA: Diagnosis not present

## 2021-10-26 DIAGNOSIS — Z86711 Personal history of pulmonary embolism: Secondary | ICD-10-CM | POA: Diagnosis not present

## 2021-10-26 DIAGNOSIS — R54 Age-related physical debility: Secondary | ICD-10-CM | POA: Diagnosis not present

## 2021-10-26 DIAGNOSIS — Z7984 Long term (current) use of oral hypoglycemic drugs: Secondary | ICD-10-CM | POA: Diagnosis not present

## 2021-10-26 DIAGNOSIS — I69398 Other sequelae of cerebral infarction: Secondary | ICD-10-CM | POA: Diagnosis not present

## 2021-10-26 DIAGNOSIS — E1151 Type 2 diabetes mellitus with diabetic peripheral angiopathy without gangrene: Secondary | ICD-10-CM | POA: Diagnosis not present

## 2021-10-26 DIAGNOSIS — E7800 Pure hypercholesterolemia, unspecified: Secondary | ICD-10-CM | POA: Diagnosis not present

## 2021-10-26 DIAGNOSIS — R260 Ataxic gait: Secondary | ICD-10-CM | POA: Diagnosis not present

## 2021-10-26 DIAGNOSIS — E538 Deficiency of other specified B group vitamins: Secondary | ICD-10-CM | POA: Diagnosis not present

## 2021-10-26 DIAGNOSIS — E1122 Type 2 diabetes mellitus with diabetic chronic kidney disease: Secondary | ICD-10-CM | POA: Diagnosis not present

## 2021-10-26 DIAGNOSIS — K760 Fatty (change of) liver, not elsewhere classified: Secondary | ICD-10-CM | POA: Diagnosis not present

## 2021-10-26 DIAGNOSIS — M109 Gout, unspecified: Secondary | ICD-10-CM | POA: Diagnosis not present

## 2021-10-26 DIAGNOSIS — Z8616 Personal history of COVID-19: Secondary | ICD-10-CM | POA: Diagnosis not present

## 2021-10-26 DIAGNOSIS — H3530 Unspecified macular degeneration: Secondary | ICD-10-CM | POA: Diagnosis not present

## 2021-11-03 DIAGNOSIS — Z8616 Personal history of COVID-19: Secondary | ICD-10-CM | POA: Diagnosis not present

## 2021-11-03 DIAGNOSIS — R54 Age-related physical debility: Secondary | ICD-10-CM | POA: Diagnosis not present

## 2021-11-03 DIAGNOSIS — E1151 Type 2 diabetes mellitus with diabetic peripheral angiopathy without gangrene: Secondary | ICD-10-CM | POA: Diagnosis not present

## 2021-11-03 DIAGNOSIS — E538 Deficiency of other specified B group vitamins: Secondary | ICD-10-CM | POA: Diagnosis not present

## 2021-11-03 DIAGNOSIS — N1832 Chronic kidney disease, stage 3b: Secondary | ICD-10-CM | POA: Diagnosis not present

## 2021-11-03 DIAGNOSIS — I129 Hypertensive chronic kidney disease with stage 1 through stage 4 chronic kidney disease, or unspecified chronic kidney disease: Secondary | ICD-10-CM | POA: Diagnosis not present

## 2021-11-03 DIAGNOSIS — Z86711 Personal history of pulmonary embolism: Secondary | ICD-10-CM | POA: Diagnosis not present

## 2021-11-03 DIAGNOSIS — M109 Gout, unspecified: Secondary | ICD-10-CM | POA: Diagnosis not present

## 2021-11-03 DIAGNOSIS — E1142 Type 2 diabetes mellitus with diabetic polyneuropathy: Secondary | ICD-10-CM | POA: Diagnosis not present

## 2021-11-03 DIAGNOSIS — I69398 Other sequelae of cerebral infarction: Secondary | ICD-10-CM | POA: Diagnosis not present

## 2021-11-03 DIAGNOSIS — I2510 Atherosclerotic heart disease of native coronary artery without angina pectoris: Secondary | ICD-10-CM | POA: Diagnosis not present

## 2021-11-03 DIAGNOSIS — R260 Ataxic gait: Secondary | ICD-10-CM | POA: Diagnosis not present

## 2021-11-03 DIAGNOSIS — E1122 Type 2 diabetes mellitus with diabetic chronic kidney disease: Secondary | ICD-10-CM | POA: Diagnosis not present

## 2021-11-03 DIAGNOSIS — H3530 Unspecified macular degeneration: Secondary | ICD-10-CM | POA: Diagnosis not present

## 2021-11-03 DIAGNOSIS — K760 Fatty (change of) liver, not elsewhere classified: Secondary | ICD-10-CM | POA: Diagnosis not present

## 2021-11-03 DIAGNOSIS — E7800 Pure hypercholesterolemia, unspecified: Secondary | ICD-10-CM | POA: Diagnosis not present

## 2021-11-03 DIAGNOSIS — Z7984 Long term (current) use of oral hypoglycemic drugs: Secondary | ICD-10-CM | POA: Diagnosis not present

## 2021-11-08 DIAGNOSIS — M109 Gout, unspecified: Secondary | ICD-10-CM | POA: Diagnosis not present

## 2021-11-08 DIAGNOSIS — E538 Deficiency of other specified B group vitamins: Secondary | ICD-10-CM | POA: Diagnosis not present

## 2021-11-08 DIAGNOSIS — I129 Hypertensive chronic kidney disease with stage 1 through stage 4 chronic kidney disease, or unspecified chronic kidney disease: Secondary | ICD-10-CM | POA: Diagnosis not present

## 2021-11-08 DIAGNOSIS — K760 Fatty (change of) liver, not elsewhere classified: Secondary | ICD-10-CM | POA: Diagnosis not present

## 2021-11-08 DIAGNOSIS — R54 Age-related physical debility: Secondary | ICD-10-CM | POA: Diagnosis not present

## 2021-11-08 DIAGNOSIS — E7800 Pure hypercholesterolemia, unspecified: Secondary | ICD-10-CM | POA: Diagnosis not present

## 2021-11-08 DIAGNOSIS — E1142 Type 2 diabetes mellitus with diabetic polyneuropathy: Secondary | ICD-10-CM | POA: Diagnosis not present

## 2021-11-08 DIAGNOSIS — Z8616 Personal history of COVID-19: Secondary | ICD-10-CM | POA: Diagnosis not present

## 2021-11-08 DIAGNOSIS — I2510 Atherosclerotic heart disease of native coronary artery without angina pectoris: Secondary | ICD-10-CM | POA: Diagnosis not present

## 2021-11-08 DIAGNOSIS — Z7984 Long term (current) use of oral hypoglycemic drugs: Secondary | ICD-10-CM | POA: Diagnosis not present

## 2021-11-08 DIAGNOSIS — R260 Ataxic gait: Secondary | ICD-10-CM | POA: Diagnosis not present

## 2021-11-08 DIAGNOSIS — I69398 Other sequelae of cerebral infarction: Secondary | ICD-10-CM | POA: Diagnosis not present

## 2021-11-08 DIAGNOSIS — H3530 Unspecified macular degeneration: Secondary | ICD-10-CM | POA: Diagnosis not present

## 2021-11-08 DIAGNOSIS — E1151 Type 2 diabetes mellitus with diabetic peripheral angiopathy without gangrene: Secondary | ICD-10-CM | POA: Diagnosis not present

## 2021-11-08 DIAGNOSIS — N1832 Chronic kidney disease, stage 3b: Secondary | ICD-10-CM | POA: Diagnosis not present

## 2021-11-08 DIAGNOSIS — Z86711 Personal history of pulmonary embolism: Secondary | ICD-10-CM | POA: Diagnosis not present

## 2021-11-08 DIAGNOSIS — E1122 Type 2 diabetes mellitus with diabetic chronic kidney disease: Secondary | ICD-10-CM | POA: Diagnosis not present

## 2021-11-18 DIAGNOSIS — R54 Age-related physical debility: Secondary | ICD-10-CM | POA: Diagnosis not present

## 2021-11-18 DIAGNOSIS — Z7984 Long term (current) use of oral hypoglycemic drugs: Secondary | ICD-10-CM | POA: Diagnosis not present

## 2021-11-18 DIAGNOSIS — I69398 Other sequelae of cerebral infarction: Secondary | ICD-10-CM | POA: Diagnosis not present

## 2021-11-18 DIAGNOSIS — I129 Hypertensive chronic kidney disease with stage 1 through stage 4 chronic kidney disease, or unspecified chronic kidney disease: Secondary | ICD-10-CM | POA: Diagnosis not present

## 2021-11-18 DIAGNOSIS — Z86711 Personal history of pulmonary embolism: Secondary | ICD-10-CM | POA: Diagnosis not present

## 2021-11-18 DIAGNOSIS — H3530 Unspecified macular degeneration: Secondary | ICD-10-CM | POA: Diagnosis not present

## 2021-11-18 DIAGNOSIS — E538 Deficiency of other specified B group vitamins: Secondary | ICD-10-CM | POA: Diagnosis not present

## 2021-11-18 DIAGNOSIS — E1142 Type 2 diabetes mellitus with diabetic polyneuropathy: Secondary | ICD-10-CM | POA: Diagnosis not present

## 2021-11-18 DIAGNOSIS — E7800 Pure hypercholesterolemia, unspecified: Secondary | ICD-10-CM | POA: Diagnosis not present

## 2021-11-18 DIAGNOSIS — R260 Ataxic gait: Secondary | ICD-10-CM | POA: Diagnosis not present

## 2021-11-18 DIAGNOSIS — K760 Fatty (change of) liver, not elsewhere classified: Secondary | ICD-10-CM | POA: Diagnosis not present

## 2021-11-18 DIAGNOSIS — E1151 Type 2 diabetes mellitus with diabetic peripheral angiopathy without gangrene: Secondary | ICD-10-CM | POA: Diagnosis not present

## 2021-11-18 DIAGNOSIS — N1832 Chronic kidney disease, stage 3b: Secondary | ICD-10-CM | POA: Diagnosis not present

## 2021-11-18 DIAGNOSIS — Z8616 Personal history of COVID-19: Secondary | ICD-10-CM | POA: Diagnosis not present

## 2021-11-18 DIAGNOSIS — E1122 Type 2 diabetes mellitus with diabetic chronic kidney disease: Secondary | ICD-10-CM | POA: Diagnosis not present

## 2021-11-18 DIAGNOSIS — M109 Gout, unspecified: Secondary | ICD-10-CM | POA: Diagnosis not present

## 2021-11-18 DIAGNOSIS — I2510 Atherosclerotic heart disease of native coronary artery without angina pectoris: Secondary | ICD-10-CM | POA: Diagnosis not present

## 2021-11-19 DIAGNOSIS — M109 Gout, unspecified: Secondary | ICD-10-CM | POA: Diagnosis not present

## 2021-11-19 DIAGNOSIS — E1151 Type 2 diabetes mellitus with diabetic peripheral angiopathy without gangrene: Secondary | ICD-10-CM | POA: Diagnosis not present

## 2021-11-19 DIAGNOSIS — I69398 Other sequelae of cerebral infarction: Secondary | ICD-10-CM | POA: Diagnosis not present

## 2021-11-19 DIAGNOSIS — Z86711 Personal history of pulmonary embolism: Secondary | ICD-10-CM | POA: Diagnosis not present

## 2021-11-19 DIAGNOSIS — E7800 Pure hypercholesterolemia, unspecified: Secondary | ICD-10-CM | POA: Diagnosis not present

## 2021-11-19 DIAGNOSIS — H3530 Unspecified macular degeneration: Secondary | ICD-10-CM | POA: Diagnosis not present

## 2021-11-19 DIAGNOSIS — N1832 Chronic kidney disease, stage 3b: Secondary | ICD-10-CM | POA: Diagnosis not present

## 2021-11-19 DIAGNOSIS — E538 Deficiency of other specified B group vitamins: Secondary | ICD-10-CM | POA: Diagnosis not present

## 2021-11-19 DIAGNOSIS — Z8616 Personal history of COVID-19: Secondary | ICD-10-CM | POA: Diagnosis not present

## 2021-11-19 DIAGNOSIS — I2510 Atherosclerotic heart disease of native coronary artery without angina pectoris: Secondary | ICD-10-CM | POA: Diagnosis not present

## 2021-11-19 DIAGNOSIS — R54 Age-related physical debility: Secondary | ICD-10-CM | POA: Diagnosis not present

## 2021-11-19 DIAGNOSIS — I129 Hypertensive chronic kidney disease with stage 1 through stage 4 chronic kidney disease, or unspecified chronic kidney disease: Secondary | ICD-10-CM | POA: Diagnosis not present

## 2021-11-19 DIAGNOSIS — R260 Ataxic gait: Secondary | ICD-10-CM | POA: Diagnosis not present

## 2021-11-19 DIAGNOSIS — E1142 Type 2 diabetes mellitus with diabetic polyneuropathy: Secondary | ICD-10-CM | POA: Diagnosis not present

## 2021-11-19 DIAGNOSIS — K760 Fatty (change of) liver, not elsewhere classified: Secondary | ICD-10-CM | POA: Diagnosis not present

## 2021-11-19 DIAGNOSIS — E1122 Type 2 diabetes mellitus with diabetic chronic kidney disease: Secondary | ICD-10-CM | POA: Diagnosis not present

## 2021-11-19 DIAGNOSIS — Z7984 Long term (current) use of oral hypoglycemic drugs: Secondary | ICD-10-CM | POA: Diagnosis not present

## 2021-11-25 DIAGNOSIS — H3530 Unspecified macular degeneration: Secondary | ICD-10-CM | POA: Diagnosis not present

## 2021-11-25 DIAGNOSIS — Z7902 Long term (current) use of antithrombotics/antiplatelets: Secondary | ICD-10-CM | POA: Diagnosis not present

## 2021-11-25 DIAGNOSIS — I129 Hypertensive chronic kidney disease with stage 1 through stage 4 chronic kidney disease, or unspecified chronic kidney disease: Secondary | ICD-10-CM | POA: Diagnosis not present

## 2021-11-25 DIAGNOSIS — I2510 Atherosclerotic heart disease of native coronary artery without angina pectoris: Secondary | ICD-10-CM | POA: Diagnosis not present

## 2021-11-25 DIAGNOSIS — Z7984 Long term (current) use of oral hypoglycemic drugs: Secondary | ICD-10-CM | POA: Diagnosis not present

## 2021-11-25 DIAGNOSIS — E1122 Type 2 diabetes mellitus with diabetic chronic kidney disease: Secondary | ICD-10-CM | POA: Diagnosis not present

## 2021-11-25 DIAGNOSIS — K760 Fatty (change of) liver, not elsewhere classified: Secondary | ICD-10-CM | POA: Diagnosis not present

## 2021-11-25 DIAGNOSIS — E7800 Pure hypercholesterolemia, unspecified: Secondary | ICD-10-CM | POA: Diagnosis not present

## 2021-11-25 DIAGNOSIS — Z8616 Personal history of COVID-19: Secondary | ICD-10-CM | POA: Diagnosis not present

## 2021-11-25 DIAGNOSIS — M109 Gout, unspecified: Secondary | ICD-10-CM | POA: Diagnosis not present

## 2021-11-25 DIAGNOSIS — N1832 Chronic kidney disease, stage 3b: Secondary | ICD-10-CM | POA: Diagnosis not present

## 2021-11-25 DIAGNOSIS — R260 Ataxic gait: Secondary | ICD-10-CM | POA: Diagnosis not present

## 2021-11-25 DIAGNOSIS — E1142 Type 2 diabetes mellitus with diabetic polyneuropathy: Secondary | ICD-10-CM | POA: Diagnosis not present

## 2021-11-25 DIAGNOSIS — E538 Deficiency of other specified B group vitamins: Secondary | ICD-10-CM | POA: Diagnosis not present

## 2021-11-25 DIAGNOSIS — R54 Age-related physical debility: Secondary | ICD-10-CM | POA: Diagnosis not present

## 2021-11-25 DIAGNOSIS — Z86711 Personal history of pulmonary embolism: Secondary | ICD-10-CM | POA: Diagnosis not present

## 2021-11-25 DIAGNOSIS — E1151 Type 2 diabetes mellitus with diabetic peripheral angiopathy without gangrene: Secondary | ICD-10-CM | POA: Diagnosis not present

## 2021-11-30 DIAGNOSIS — Z7902 Long term (current) use of antithrombotics/antiplatelets: Secondary | ICD-10-CM | POA: Diagnosis not present

## 2021-11-30 DIAGNOSIS — E1122 Type 2 diabetes mellitus with diabetic chronic kidney disease: Secondary | ICD-10-CM | POA: Diagnosis not present

## 2021-11-30 DIAGNOSIS — N1832 Chronic kidney disease, stage 3b: Secondary | ICD-10-CM | POA: Diagnosis not present

## 2021-11-30 DIAGNOSIS — I129 Hypertensive chronic kidney disease with stage 1 through stage 4 chronic kidney disease, or unspecified chronic kidney disease: Secondary | ICD-10-CM | POA: Diagnosis not present

## 2021-11-30 DIAGNOSIS — E7800 Pure hypercholesterolemia, unspecified: Secondary | ICD-10-CM | POA: Diagnosis not present

## 2021-11-30 DIAGNOSIS — I2510 Atherosclerotic heart disease of native coronary artery without angina pectoris: Secondary | ICD-10-CM | POA: Diagnosis not present

## 2021-11-30 DIAGNOSIS — Z86711 Personal history of pulmonary embolism: Secondary | ICD-10-CM | POA: Diagnosis not present

## 2021-11-30 DIAGNOSIS — Z8616 Personal history of COVID-19: Secondary | ICD-10-CM | POA: Diagnosis not present

## 2021-11-30 DIAGNOSIS — H3530 Unspecified macular degeneration: Secondary | ICD-10-CM | POA: Diagnosis not present

## 2021-11-30 DIAGNOSIS — E538 Deficiency of other specified B group vitamins: Secondary | ICD-10-CM | POA: Diagnosis not present

## 2021-11-30 DIAGNOSIS — Z7984 Long term (current) use of oral hypoglycemic drugs: Secondary | ICD-10-CM | POA: Diagnosis not present

## 2021-11-30 DIAGNOSIS — E1151 Type 2 diabetes mellitus with diabetic peripheral angiopathy without gangrene: Secondary | ICD-10-CM | POA: Diagnosis not present

## 2021-11-30 DIAGNOSIS — M109 Gout, unspecified: Secondary | ICD-10-CM | POA: Diagnosis not present

## 2021-11-30 DIAGNOSIS — R54 Age-related physical debility: Secondary | ICD-10-CM | POA: Diagnosis not present

## 2021-11-30 DIAGNOSIS — R260 Ataxic gait: Secondary | ICD-10-CM | POA: Diagnosis not present

## 2021-11-30 DIAGNOSIS — E1142 Type 2 diabetes mellitus with diabetic polyneuropathy: Secondary | ICD-10-CM | POA: Diagnosis not present

## 2021-11-30 DIAGNOSIS — K760 Fatty (change of) liver, not elsewhere classified: Secondary | ICD-10-CM | POA: Diagnosis not present

## 2021-12-09 DIAGNOSIS — E1151 Type 2 diabetes mellitus with diabetic peripheral angiopathy without gangrene: Secondary | ICD-10-CM | POA: Diagnosis not present

## 2021-12-09 DIAGNOSIS — E1142 Type 2 diabetes mellitus with diabetic polyneuropathy: Secondary | ICD-10-CM | POA: Diagnosis not present

## 2021-12-09 DIAGNOSIS — R54 Age-related physical debility: Secondary | ICD-10-CM | POA: Diagnosis not present

## 2021-12-09 DIAGNOSIS — E1122 Type 2 diabetes mellitus with diabetic chronic kidney disease: Secondary | ICD-10-CM | POA: Diagnosis not present

## 2021-12-09 DIAGNOSIS — H3530 Unspecified macular degeneration: Secondary | ICD-10-CM | POA: Diagnosis not present

## 2021-12-09 DIAGNOSIS — N1832 Chronic kidney disease, stage 3b: Secondary | ICD-10-CM | POA: Diagnosis not present

## 2021-12-09 DIAGNOSIS — I2510 Atherosclerotic heart disease of native coronary artery without angina pectoris: Secondary | ICD-10-CM | POA: Diagnosis not present

## 2021-12-09 DIAGNOSIS — K760 Fatty (change of) liver, not elsewhere classified: Secondary | ICD-10-CM | POA: Diagnosis not present

## 2021-12-09 DIAGNOSIS — Z86711 Personal history of pulmonary embolism: Secondary | ICD-10-CM | POA: Diagnosis not present

## 2021-12-09 DIAGNOSIS — Z8616 Personal history of COVID-19: Secondary | ICD-10-CM | POA: Diagnosis not present

## 2021-12-09 DIAGNOSIS — R260 Ataxic gait: Secondary | ICD-10-CM | POA: Diagnosis not present

## 2021-12-09 DIAGNOSIS — Z7984 Long term (current) use of oral hypoglycemic drugs: Secondary | ICD-10-CM | POA: Diagnosis not present

## 2021-12-09 DIAGNOSIS — I129 Hypertensive chronic kidney disease with stage 1 through stage 4 chronic kidney disease, or unspecified chronic kidney disease: Secondary | ICD-10-CM | POA: Diagnosis not present

## 2021-12-09 DIAGNOSIS — E538 Deficiency of other specified B group vitamins: Secondary | ICD-10-CM | POA: Diagnosis not present

## 2021-12-09 DIAGNOSIS — Z7902 Long term (current) use of antithrombotics/antiplatelets: Secondary | ICD-10-CM | POA: Diagnosis not present

## 2021-12-09 DIAGNOSIS — M109 Gout, unspecified: Secondary | ICD-10-CM | POA: Diagnosis not present

## 2021-12-09 DIAGNOSIS — E7800 Pure hypercholesterolemia, unspecified: Secondary | ICD-10-CM | POA: Diagnosis not present

## 2021-12-11 DIAGNOSIS — E7800 Pure hypercholesterolemia, unspecified: Secondary | ICD-10-CM | POA: Diagnosis not present

## 2021-12-11 DIAGNOSIS — E538 Deficiency of other specified B group vitamins: Secondary | ICD-10-CM | POA: Diagnosis not present

## 2021-12-11 DIAGNOSIS — I129 Hypertensive chronic kidney disease with stage 1 through stage 4 chronic kidney disease, or unspecified chronic kidney disease: Secondary | ICD-10-CM | POA: Diagnosis not present

## 2021-12-11 DIAGNOSIS — R54 Age-related physical debility: Secondary | ICD-10-CM | POA: Diagnosis not present

## 2021-12-11 DIAGNOSIS — E1122 Type 2 diabetes mellitus with diabetic chronic kidney disease: Secondary | ICD-10-CM | POA: Diagnosis not present

## 2021-12-11 DIAGNOSIS — E1142 Type 2 diabetes mellitus with diabetic polyneuropathy: Secondary | ICD-10-CM | POA: Diagnosis not present

## 2021-12-11 DIAGNOSIS — K760 Fatty (change of) liver, not elsewhere classified: Secondary | ICD-10-CM | POA: Diagnosis not present

## 2021-12-11 DIAGNOSIS — Z7902 Long term (current) use of antithrombotics/antiplatelets: Secondary | ICD-10-CM | POA: Diagnosis not present

## 2021-12-11 DIAGNOSIS — E1151 Type 2 diabetes mellitus with diabetic peripheral angiopathy without gangrene: Secondary | ICD-10-CM | POA: Diagnosis not present

## 2021-12-11 DIAGNOSIS — I2510 Atherosclerotic heart disease of native coronary artery without angina pectoris: Secondary | ICD-10-CM | POA: Diagnosis not present

## 2021-12-11 DIAGNOSIS — Z7984 Long term (current) use of oral hypoglycemic drugs: Secondary | ICD-10-CM | POA: Diagnosis not present

## 2021-12-11 DIAGNOSIS — M109 Gout, unspecified: Secondary | ICD-10-CM | POA: Diagnosis not present

## 2021-12-11 DIAGNOSIS — H3530 Unspecified macular degeneration: Secondary | ICD-10-CM | POA: Diagnosis not present

## 2021-12-11 DIAGNOSIS — N1832 Chronic kidney disease, stage 3b: Secondary | ICD-10-CM | POA: Diagnosis not present

## 2021-12-11 DIAGNOSIS — R260 Ataxic gait: Secondary | ICD-10-CM | POA: Diagnosis not present

## 2021-12-11 DIAGNOSIS — Z86711 Personal history of pulmonary embolism: Secondary | ICD-10-CM | POA: Diagnosis not present

## 2021-12-11 DIAGNOSIS — Z8616 Personal history of COVID-19: Secondary | ICD-10-CM | POA: Diagnosis not present

## 2021-12-16 DIAGNOSIS — E1151 Type 2 diabetes mellitus with diabetic peripheral angiopathy without gangrene: Secondary | ICD-10-CM | POA: Diagnosis not present

## 2021-12-16 DIAGNOSIS — E7800 Pure hypercholesterolemia, unspecified: Secondary | ICD-10-CM | POA: Diagnosis not present

## 2021-12-16 DIAGNOSIS — R260 Ataxic gait: Secondary | ICD-10-CM | POA: Diagnosis not present

## 2021-12-16 DIAGNOSIS — Z7984 Long term (current) use of oral hypoglycemic drugs: Secondary | ICD-10-CM | POA: Diagnosis not present

## 2021-12-16 DIAGNOSIS — M109 Gout, unspecified: Secondary | ICD-10-CM | POA: Diagnosis not present

## 2021-12-16 DIAGNOSIS — Z7902 Long term (current) use of antithrombotics/antiplatelets: Secondary | ICD-10-CM | POA: Diagnosis not present

## 2021-12-16 DIAGNOSIS — R54 Age-related physical debility: Secondary | ICD-10-CM | POA: Diagnosis not present

## 2021-12-16 DIAGNOSIS — H3530 Unspecified macular degeneration: Secondary | ICD-10-CM | POA: Diagnosis not present

## 2021-12-16 DIAGNOSIS — N1832 Chronic kidney disease, stage 3b: Secondary | ICD-10-CM | POA: Diagnosis not present

## 2021-12-16 DIAGNOSIS — E1122 Type 2 diabetes mellitus with diabetic chronic kidney disease: Secondary | ICD-10-CM | POA: Diagnosis not present

## 2021-12-16 DIAGNOSIS — I2510 Atherosclerotic heart disease of native coronary artery without angina pectoris: Secondary | ICD-10-CM | POA: Diagnosis not present

## 2021-12-16 DIAGNOSIS — Z86711 Personal history of pulmonary embolism: Secondary | ICD-10-CM | POA: Diagnosis not present

## 2021-12-16 DIAGNOSIS — I129 Hypertensive chronic kidney disease with stage 1 through stage 4 chronic kidney disease, or unspecified chronic kidney disease: Secondary | ICD-10-CM | POA: Diagnosis not present

## 2021-12-16 DIAGNOSIS — Z8616 Personal history of COVID-19: Secondary | ICD-10-CM | POA: Diagnosis not present

## 2021-12-16 DIAGNOSIS — E1142 Type 2 diabetes mellitus with diabetic polyneuropathy: Secondary | ICD-10-CM | POA: Diagnosis not present

## 2021-12-16 DIAGNOSIS — E538 Deficiency of other specified B group vitamins: Secondary | ICD-10-CM | POA: Diagnosis not present

## 2021-12-16 DIAGNOSIS — K760 Fatty (change of) liver, not elsewhere classified: Secondary | ICD-10-CM | POA: Diagnosis not present

## 2021-12-18 DIAGNOSIS — E1122 Type 2 diabetes mellitus with diabetic chronic kidney disease: Secondary | ICD-10-CM | POA: Diagnosis not present

## 2021-12-18 DIAGNOSIS — E538 Deficiency of other specified B group vitamins: Secondary | ICD-10-CM | POA: Diagnosis not present

## 2021-12-18 DIAGNOSIS — I2510 Atherosclerotic heart disease of native coronary artery without angina pectoris: Secondary | ICD-10-CM | POA: Diagnosis not present

## 2021-12-18 DIAGNOSIS — Z7984 Long term (current) use of oral hypoglycemic drugs: Secondary | ICD-10-CM | POA: Diagnosis not present

## 2021-12-18 DIAGNOSIS — E7800 Pure hypercholesterolemia, unspecified: Secondary | ICD-10-CM | POA: Diagnosis not present

## 2021-12-18 DIAGNOSIS — E1142 Type 2 diabetes mellitus with diabetic polyneuropathy: Secondary | ICD-10-CM | POA: Diagnosis not present

## 2021-12-18 DIAGNOSIS — Z7902 Long term (current) use of antithrombotics/antiplatelets: Secondary | ICD-10-CM | POA: Diagnosis not present

## 2021-12-18 DIAGNOSIS — K760 Fatty (change of) liver, not elsewhere classified: Secondary | ICD-10-CM | POA: Diagnosis not present

## 2021-12-18 DIAGNOSIS — I129 Hypertensive chronic kidney disease with stage 1 through stage 4 chronic kidney disease, or unspecified chronic kidney disease: Secondary | ICD-10-CM | POA: Diagnosis not present

## 2021-12-18 DIAGNOSIS — R54 Age-related physical debility: Secondary | ICD-10-CM | POA: Diagnosis not present

## 2021-12-18 DIAGNOSIS — H3530 Unspecified macular degeneration: Secondary | ICD-10-CM | POA: Diagnosis not present

## 2021-12-18 DIAGNOSIS — Z8616 Personal history of COVID-19: Secondary | ICD-10-CM | POA: Diagnosis not present

## 2021-12-18 DIAGNOSIS — E1151 Type 2 diabetes mellitus with diabetic peripheral angiopathy without gangrene: Secondary | ICD-10-CM | POA: Diagnosis not present

## 2021-12-18 DIAGNOSIS — M109 Gout, unspecified: Secondary | ICD-10-CM | POA: Diagnosis not present

## 2021-12-18 DIAGNOSIS — R260 Ataxic gait: Secondary | ICD-10-CM | POA: Diagnosis not present

## 2021-12-18 DIAGNOSIS — Z86711 Personal history of pulmonary embolism: Secondary | ICD-10-CM | POA: Diagnosis not present

## 2021-12-18 DIAGNOSIS — N1832 Chronic kidney disease, stage 3b: Secondary | ICD-10-CM | POA: Diagnosis not present

## 2021-12-24 DIAGNOSIS — I129 Hypertensive chronic kidney disease with stage 1 through stage 4 chronic kidney disease, or unspecified chronic kidney disease: Secondary | ICD-10-CM | POA: Diagnosis not present

## 2021-12-24 DIAGNOSIS — E1142 Type 2 diabetes mellitus with diabetic polyneuropathy: Secondary | ICD-10-CM | POA: Diagnosis not present

## 2021-12-24 DIAGNOSIS — E1122 Type 2 diabetes mellitus with diabetic chronic kidney disease: Secondary | ICD-10-CM | POA: Diagnosis not present

## 2021-12-24 DIAGNOSIS — R54 Age-related physical debility: Secondary | ICD-10-CM | POA: Diagnosis not present

## 2021-12-24 DIAGNOSIS — M109 Gout, unspecified: Secondary | ICD-10-CM | POA: Diagnosis not present

## 2021-12-24 DIAGNOSIS — H3530 Unspecified macular degeneration: Secondary | ICD-10-CM | POA: Diagnosis not present

## 2021-12-24 DIAGNOSIS — E7800 Pure hypercholesterolemia, unspecified: Secondary | ICD-10-CM | POA: Diagnosis not present

## 2021-12-24 DIAGNOSIS — I2510 Atherosclerotic heart disease of native coronary artery without angina pectoris: Secondary | ICD-10-CM | POA: Diagnosis not present

## 2021-12-24 DIAGNOSIS — E538 Deficiency of other specified B group vitamins: Secondary | ICD-10-CM | POA: Diagnosis not present

## 2021-12-24 DIAGNOSIS — Z7902 Long term (current) use of antithrombotics/antiplatelets: Secondary | ICD-10-CM | POA: Diagnosis not present

## 2021-12-24 DIAGNOSIS — K760 Fatty (change of) liver, not elsewhere classified: Secondary | ICD-10-CM | POA: Diagnosis not present

## 2021-12-24 DIAGNOSIS — Z8616 Personal history of COVID-19: Secondary | ICD-10-CM | POA: Diagnosis not present

## 2021-12-24 DIAGNOSIS — R260 Ataxic gait: Secondary | ICD-10-CM | POA: Diagnosis not present

## 2021-12-24 DIAGNOSIS — E1151 Type 2 diabetes mellitus with diabetic peripheral angiopathy without gangrene: Secondary | ICD-10-CM | POA: Diagnosis not present

## 2021-12-24 DIAGNOSIS — N1832 Chronic kidney disease, stage 3b: Secondary | ICD-10-CM | POA: Diagnosis not present

## 2021-12-24 DIAGNOSIS — Z86711 Personal history of pulmonary embolism: Secondary | ICD-10-CM | POA: Diagnosis not present

## 2021-12-24 DIAGNOSIS — Z7984 Long term (current) use of oral hypoglycemic drugs: Secondary | ICD-10-CM | POA: Diagnosis not present

## 2021-12-30 DIAGNOSIS — E1122 Type 2 diabetes mellitus with diabetic chronic kidney disease: Secondary | ICD-10-CM | POA: Diagnosis not present

## 2021-12-30 DIAGNOSIS — Z8616 Personal history of COVID-19: Secondary | ICD-10-CM | POA: Diagnosis not present

## 2021-12-30 DIAGNOSIS — K760 Fatty (change of) liver, not elsewhere classified: Secondary | ICD-10-CM | POA: Diagnosis not present

## 2021-12-30 DIAGNOSIS — Z86711 Personal history of pulmonary embolism: Secondary | ICD-10-CM | POA: Diagnosis not present

## 2021-12-30 DIAGNOSIS — I129 Hypertensive chronic kidney disease with stage 1 through stage 4 chronic kidney disease, or unspecified chronic kidney disease: Secondary | ICD-10-CM | POA: Diagnosis not present

## 2021-12-30 DIAGNOSIS — E1151 Type 2 diabetes mellitus with diabetic peripheral angiopathy without gangrene: Secondary | ICD-10-CM | POA: Diagnosis not present

## 2021-12-30 DIAGNOSIS — N1832 Chronic kidney disease, stage 3b: Secondary | ICD-10-CM | POA: Diagnosis not present

## 2021-12-30 DIAGNOSIS — R54 Age-related physical debility: Secondary | ICD-10-CM | POA: Diagnosis not present

## 2021-12-30 DIAGNOSIS — E1142 Type 2 diabetes mellitus with diabetic polyneuropathy: Secondary | ICD-10-CM | POA: Diagnosis not present

## 2021-12-30 DIAGNOSIS — Z7902 Long term (current) use of antithrombotics/antiplatelets: Secondary | ICD-10-CM | POA: Diagnosis not present

## 2021-12-30 DIAGNOSIS — R260 Ataxic gait: Secondary | ICD-10-CM | POA: Diagnosis not present

## 2021-12-30 DIAGNOSIS — M109 Gout, unspecified: Secondary | ICD-10-CM | POA: Diagnosis not present

## 2021-12-30 DIAGNOSIS — I2510 Atherosclerotic heart disease of native coronary artery without angina pectoris: Secondary | ICD-10-CM | POA: Diagnosis not present

## 2021-12-30 DIAGNOSIS — Z7984 Long term (current) use of oral hypoglycemic drugs: Secondary | ICD-10-CM | POA: Diagnosis not present

## 2021-12-30 DIAGNOSIS — E538 Deficiency of other specified B group vitamins: Secondary | ICD-10-CM | POA: Diagnosis not present

## 2021-12-30 DIAGNOSIS — H3530 Unspecified macular degeneration: Secondary | ICD-10-CM | POA: Diagnosis not present

## 2021-12-30 DIAGNOSIS — E7800 Pure hypercholesterolemia, unspecified: Secondary | ICD-10-CM | POA: Diagnosis not present

## 2022-01-04 DIAGNOSIS — H3530 Unspecified macular degeneration: Secondary | ICD-10-CM | POA: Diagnosis not present

## 2022-01-04 DIAGNOSIS — R54 Age-related physical debility: Secondary | ICD-10-CM | POA: Diagnosis not present

## 2022-01-04 DIAGNOSIS — N1832 Chronic kidney disease, stage 3b: Secondary | ICD-10-CM | POA: Diagnosis not present

## 2022-01-04 DIAGNOSIS — E1142 Type 2 diabetes mellitus with diabetic polyneuropathy: Secondary | ICD-10-CM | POA: Diagnosis not present

## 2022-01-04 DIAGNOSIS — R260 Ataxic gait: Secondary | ICD-10-CM | POA: Diagnosis not present

## 2022-01-04 DIAGNOSIS — Z86711 Personal history of pulmonary embolism: Secondary | ICD-10-CM | POA: Diagnosis not present

## 2022-01-04 DIAGNOSIS — I129 Hypertensive chronic kidney disease with stage 1 through stage 4 chronic kidney disease, or unspecified chronic kidney disease: Secondary | ICD-10-CM | POA: Diagnosis not present

## 2022-01-04 DIAGNOSIS — E7800 Pure hypercholesterolemia, unspecified: Secondary | ICD-10-CM | POA: Diagnosis not present

## 2022-01-04 DIAGNOSIS — Z7902 Long term (current) use of antithrombotics/antiplatelets: Secondary | ICD-10-CM | POA: Diagnosis not present

## 2022-01-04 DIAGNOSIS — E1151 Type 2 diabetes mellitus with diabetic peripheral angiopathy without gangrene: Secondary | ICD-10-CM | POA: Diagnosis not present

## 2022-01-04 DIAGNOSIS — M109 Gout, unspecified: Secondary | ICD-10-CM | POA: Diagnosis not present

## 2022-01-04 DIAGNOSIS — E538 Deficiency of other specified B group vitamins: Secondary | ICD-10-CM | POA: Diagnosis not present

## 2022-01-04 DIAGNOSIS — Z8616 Personal history of COVID-19: Secondary | ICD-10-CM | POA: Diagnosis not present

## 2022-01-04 DIAGNOSIS — K760 Fatty (change of) liver, not elsewhere classified: Secondary | ICD-10-CM | POA: Diagnosis not present

## 2022-01-04 DIAGNOSIS — E1122 Type 2 diabetes mellitus with diabetic chronic kidney disease: Secondary | ICD-10-CM | POA: Diagnosis not present

## 2022-01-04 DIAGNOSIS — Z7984 Long term (current) use of oral hypoglycemic drugs: Secondary | ICD-10-CM | POA: Diagnosis not present

## 2022-01-04 DIAGNOSIS — I2510 Atherosclerotic heart disease of native coronary artery without angina pectoris: Secondary | ICD-10-CM | POA: Diagnosis not present

## 2022-01-11 DIAGNOSIS — I129 Hypertensive chronic kidney disease with stage 1 through stage 4 chronic kidney disease, or unspecified chronic kidney disease: Secondary | ICD-10-CM | POA: Diagnosis not present

## 2022-01-11 DIAGNOSIS — Z7902 Long term (current) use of antithrombotics/antiplatelets: Secondary | ICD-10-CM | POA: Diagnosis not present

## 2022-01-11 DIAGNOSIS — H3530 Unspecified macular degeneration: Secondary | ICD-10-CM | POA: Diagnosis not present

## 2022-01-11 DIAGNOSIS — Z7984 Long term (current) use of oral hypoglycemic drugs: Secondary | ICD-10-CM | POA: Diagnosis not present

## 2022-01-11 DIAGNOSIS — E538 Deficiency of other specified B group vitamins: Secondary | ICD-10-CM | POA: Diagnosis not present

## 2022-01-11 DIAGNOSIS — E1142 Type 2 diabetes mellitus with diabetic polyneuropathy: Secondary | ICD-10-CM | POA: Diagnosis not present

## 2022-01-11 DIAGNOSIS — K760 Fatty (change of) liver, not elsewhere classified: Secondary | ICD-10-CM | POA: Diagnosis not present

## 2022-01-11 DIAGNOSIS — N1832 Chronic kidney disease, stage 3b: Secondary | ICD-10-CM | POA: Diagnosis not present

## 2022-01-11 DIAGNOSIS — R260 Ataxic gait: Secondary | ICD-10-CM | POA: Diagnosis not present

## 2022-01-11 DIAGNOSIS — R54 Age-related physical debility: Secondary | ICD-10-CM | POA: Diagnosis not present

## 2022-01-11 DIAGNOSIS — Z8616 Personal history of COVID-19: Secondary | ICD-10-CM | POA: Diagnosis not present

## 2022-01-11 DIAGNOSIS — E7800 Pure hypercholesterolemia, unspecified: Secondary | ICD-10-CM | POA: Diagnosis not present

## 2022-01-11 DIAGNOSIS — E1151 Type 2 diabetes mellitus with diabetic peripheral angiopathy without gangrene: Secondary | ICD-10-CM | POA: Diagnosis not present

## 2022-01-11 DIAGNOSIS — E1122 Type 2 diabetes mellitus with diabetic chronic kidney disease: Secondary | ICD-10-CM | POA: Diagnosis not present

## 2022-01-11 DIAGNOSIS — I2510 Atherosclerotic heart disease of native coronary artery without angina pectoris: Secondary | ICD-10-CM | POA: Diagnosis not present

## 2022-01-11 DIAGNOSIS — Z86711 Personal history of pulmonary embolism: Secondary | ICD-10-CM | POA: Diagnosis not present

## 2022-01-11 DIAGNOSIS — M109 Gout, unspecified: Secondary | ICD-10-CM | POA: Diagnosis not present

## 2022-01-14 DIAGNOSIS — I129 Hypertensive chronic kidney disease with stage 1 through stage 4 chronic kidney disease, or unspecified chronic kidney disease: Secondary | ICD-10-CM | POA: Diagnosis not present

## 2022-01-14 DIAGNOSIS — M109 Gout, unspecified: Secondary | ICD-10-CM | POA: Diagnosis not present

## 2022-01-14 DIAGNOSIS — E7800 Pure hypercholesterolemia, unspecified: Secondary | ICD-10-CM | POA: Diagnosis not present

## 2022-01-14 DIAGNOSIS — Z86711 Personal history of pulmonary embolism: Secondary | ICD-10-CM | POA: Diagnosis not present

## 2022-01-14 DIAGNOSIS — H3530 Unspecified macular degeneration: Secondary | ICD-10-CM | POA: Diagnosis not present

## 2022-01-14 DIAGNOSIS — R260 Ataxic gait: Secondary | ICD-10-CM | POA: Diagnosis not present

## 2022-01-14 DIAGNOSIS — R54 Age-related physical debility: Secondary | ICD-10-CM | POA: Diagnosis not present

## 2022-01-14 DIAGNOSIS — Z7984 Long term (current) use of oral hypoglycemic drugs: Secondary | ICD-10-CM | POA: Diagnosis not present

## 2022-01-14 DIAGNOSIS — E1142 Type 2 diabetes mellitus with diabetic polyneuropathy: Secondary | ICD-10-CM | POA: Diagnosis not present

## 2022-01-14 DIAGNOSIS — E1122 Type 2 diabetes mellitus with diabetic chronic kidney disease: Secondary | ICD-10-CM | POA: Diagnosis not present

## 2022-01-14 DIAGNOSIS — I2510 Atherosclerotic heart disease of native coronary artery without angina pectoris: Secondary | ICD-10-CM | POA: Diagnosis not present

## 2022-01-14 DIAGNOSIS — E538 Deficiency of other specified B group vitamins: Secondary | ICD-10-CM | POA: Diagnosis not present

## 2022-01-14 DIAGNOSIS — Z7902 Long term (current) use of antithrombotics/antiplatelets: Secondary | ICD-10-CM | POA: Diagnosis not present

## 2022-01-14 DIAGNOSIS — E1151 Type 2 diabetes mellitus with diabetic peripheral angiopathy without gangrene: Secondary | ICD-10-CM | POA: Diagnosis not present

## 2022-01-14 DIAGNOSIS — Z8616 Personal history of COVID-19: Secondary | ICD-10-CM | POA: Diagnosis not present

## 2022-01-14 DIAGNOSIS — K760 Fatty (change of) liver, not elsewhere classified: Secondary | ICD-10-CM | POA: Diagnosis not present

## 2022-01-14 DIAGNOSIS — N1832 Chronic kidney disease, stage 3b: Secondary | ICD-10-CM | POA: Diagnosis not present

## 2022-02-02 DIAGNOSIS — R260 Ataxic gait: Secondary | ICD-10-CM | POA: Diagnosis not present

## 2022-02-02 DIAGNOSIS — E7800 Pure hypercholesterolemia, unspecified: Secondary | ICD-10-CM | POA: Diagnosis not present

## 2022-02-02 DIAGNOSIS — R110 Nausea: Secondary | ICD-10-CM | POA: Diagnosis not present

## 2022-02-02 DIAGNOSIS — K760 Fatty (change of) liver, not elsewhere classified: Secondary | ICD-10-CM | POA: Diagnosis not present

## 2022-02-02 DIAGNOSIS — E1122 Type 2 diabetes mellitus with diabetic chronic kidney disease: Secondary | ICD-10-CM | POA: Diagnosis not present

## 2022-02-02 DIAGNOSIS — H3530 Unspecified macular degeneration: Secondary | ICD-10-CM | POA: Diagnosis not present

## 2022-02-02 DIAGNOSIS — Z23 Encounter for immunization: Secondary | ICD-10-CM | POA: Diagnosis not present

## 2022-02-02 DIAGNOSIS — N1832 Chronic kidney disease, stage 3b: Secondary | ICD-10-CM | POA: Diagnosis not present

## 2022-02-02 DIAGNOSIS — M109 Gout, unspecified: Secondary | ICD-10-CM | POA: Diagnosis not present

## 2022-02-02 DIAGNOSIS — Z0000 Encounter for general adult medical examination without abnormal findings: Secondary | ICD-10-CM | POA: Diagnosis not present

## 2022-02-02 DIAGNOSIS — E1151 Type 2 diabetes mellitus with diabetic peripheral angiopathy without gangrene: Secondary | ICD-10-CM | POA: Diagnosis not present

## 2022-04-19 DIAGNOSIS — J984 Other disorders of lung: Secondary | ICD-10-CM | POA: Diagnosis not present

## 2022-04-19 DIAGNOSIS — Z20822 Contact with and (suspected) exposure to covid-19: Secondary | ICD-10-CM | POA: Diagnosis not present

## 2022-04-19 DIAGNOSIS — J9811 Atelectasis: Secondary | ICD-10-CM | POA: Diagnosis not present

## 2022-04-19 DIAGNOSIS — R6889 Other general symptoms and signs: Secondary | ICD-10-CM | POA: Diagnosis not present

## 2022-04-19 DIAGNOSIS — A419 Sepsis, unspecified organism: Secondary | ICD-10-CM | POA: Diagnosis not present

## 2022-04-19 DIAGNOSIS — R059 Cough, unspecified: Secondary | ICD-10-CM | POA: Diagnosis not present

## 2022-04-19 DIAGNOSIS — I7090 Unspecified atherosclerosis: Secondary | ICD-10-CM | POA: Diagnosis not present

## 2022-04-19 DIAGNOSIS — R918 Other nonspecific abnormal finding of lung field: Secondary | ICD-10-CM | POA: Diagnosis not present

## 2022-04-19 DIAGNOSIS — Z66 Do not resuscitate: Secondary | ICD-10-CM | POA: Diagnosis not present

## 2022-04-19 DIAGNOSIS — I499 Cardiac arrhythmia, unspecified: Secondary | ICD-10-CM | POA: Diagnosis not present

## 2022-04-19 DIAGNOSIS — Z881 Allergy status to other antibiotic agents status: Secondary | ICD-10-CM | POA: Diagnosis not present

## 2022-04-19 DIAGNOSIS — R0902 Hypoxemia: Secondary | ICD-10-CM | POA: Diagnosis not present

## 2022-04-19 DIAGNOSIS — Z743 Need for continuous supervision: Secondary | ICD-10-CM | POA: Diagnosis not present

## 2022-04-19 DIAGNOSIS — R10817 Generalized abdominal tenderness: Secondary | ICD-10-CM | POA: Diagnosis not present

## 2022-04-19 DIAGNOSIS — R509 Fever, unspecified: Secondary | ICD-10-CM | POA: Diagnosis not present

## 2022-04-19 DIAGNOSIS — R000 Tachycardia, unspecified: Secondary | ICD-10-CM | POA: Diagnosis not present

## 2022-04-19 DIAGNOSIS — J690 Pneumonitis due to inhalation of food and vomit: Secondary | ICD-10-CM | POA: Diagnosis not present

## 2022-04-19 DIAGNOSIS — R6520 Severe sepsis without septic shock: Secondary | ICD-10-CM | POA: Diagnosis not present

## 2022-04-19 DIAGNOSIS — J9601 Acute respiratory failure with hypoxia: Secondary | ICD-10-CM | POA: Diagnosis not present

## 2022-04-19 DIAGNOSIS — R1110 Vomiting, unspecified: Secondary | ICD-10-CM | POA: Diagnosis not present

## 2022-04-20 ENCOUNTER — Inpatient Hospital Stay: Admit: 2022-04-20 | Payer: Medicare Other

## 2022-04-20 ENCOUNTER — Encounter (HOSPITAL_COMMUNITY): Payer: Self-pay

## 2022-04-20 ENCOUNTER — Inpatient Hospital Stay: Admit: 2022-04-20 | Payer: Medicare Other | Admitting: Internal Medicine

## 2022-04-20 DIAGNOSIS — J439 Emphysema, unspecified: Secondary | ICD-10-CM | POA: Diagnosis not present

## 2022-04-20 DIAGNOSIS — J9811 Atelectasis: Secondary | ICD-10-CM | POA: Diagnosis not present

## 2022-04-20 DIAGNOSIS — Z9981 Dependence on supplemental oxygen: Secondary | ICD-10-CM | POA: Diagnosis not present

## 2022-04-20 DIAGNOSIS — I4891 Unspecified atrial fibrillation: Secondary | ICD-10-CM | POA: Diagnosis not present

## 2022-04-20 DIAGNOSIS — I69351 Hemiplegia and hemiparesis following cerebral infarction affecting right dominant side: Secondary | ICD-10-CM | POA: Diagnosis not present

## 2022-04-20 DIAGNOSIS — J9601 Acute respiratory failure with hypoxia: Secondary | ICD-10-CM | POA: Diagnosis not present

## 2022-04-20 DIAGNOSIS — R0602 Shortness of breath: Secondary | ICD-10-CM | POA: Diagnosis not present

## 2022-04-20 DIAGNOSIS — Z66 Do not resuscitate: Secondary | ICD-10-CM | POA: Diagnosis not present

## 2022-04-20 DIAGNOSIS — R7989 Other specified abnormal findings of blood chemistry: Secondary | ICD-10-CM | POA: Diagnosis not present

## 2022-04-20 DIAGNOSIS — N189 Chronic kidney disease, unspecified: Secondary | ICD-10-CM | POA: Diagnosis not present

## 2022-04-20 DIAGNOSIS — M6281 Muscle weakness (generalized): Secondary | ICD-10-CM | POA: Diagnosis not present

## 2022-04-20 DIAGNOSIS — R10817 Generalized abdominal tenderness: Secondary | ICD-10-CM | POA: Diagnosis not present

## 2022-04-20 DIAGNOSIS — Z7902 Long term (current) use of antithrombotics/antiplatelets: Secondary | ICD-10-CM | POA: Diagnosis not present

## 2022-04-20 DIAGNOSIS — R279 Unspecified lack of coordination: Secondary | ICD-10-CM | POA: Diagnosis not present

## 2022-04-20 DIAGNOSIS — Z7952 Long term (current) use of systemic steroids: Secondary | ICD-10-CM | POA: Diagnosis not present

## 2022-04-20 DIAGNOSIS — E785 Hyperlipidemia, unspecified: Secondary | ICD-10-CM | POA: Diagnosis not present

## 2022-04-20 DIAGNOSIS — J690 Pneumonitis due to inhalation of food and vomit: Secondary | ICD-10-CM | POA: Diagnosis not present

## 2022-04-20 DIAGNOSIS — A419 Sepsis, unspecified organism: Secondary | ICD-10-CM | POA: Diagnosis not present

## 2022-04-20 DIAGNOSIS — Z881 Allergy status to other antibiotic agents status: Secondary | ICD-10-CM | POA: Diagnosis not present

## 2022-04-20 DIAGNOSIS — Z7984 Long term (current) use of oral hypoglycemic drugs: Secondary | ICD-10-CM | POA: Diagnosis not present

## 2022-04-20 DIAGNOSIS — N183 Chronic kidney disease, stage 3 unspecified: Secondary | ICD-10-CM | POA: Diagnosis not present

## 2022-04-20 DIAGNOSIS — R1110 Vomiting, unspecified: Secondary | ICD-10-CM | POA: Diagnosis not present

## 2022-04-20 DIAGNOSIS — E1122 Type 2 diabetes mellitus with diabetic chronic kidney disease: Secondary | ICD-10-CM | POA: Diagnosis not present

## 2022-04-20 DIAGNOSIS — I2489 Other forms of acute ischemic heart disease: Secondary | ICD-10-CM | POA: Diagnosis not present

## 2022-04-20 DIAGNOSIS — R6520 Severe sepsis without septic shock: Secondary | ICD-10-CM | POA: Diagnosis not present

## 2022-04-20 DIAGNOSIS — J984 Other disorders of lung: Secondary | ICD-10-CM | POA: Diagnosis not present

## 2022-04-20 DIAGNOSIS — E1151 Type 2 diabetes mellitus with diabetic peripheral angiopathy without gangrene: Secondary | ICD-10-CM | POA: Diagnosis not present

## 2022-04-20 DIAGNOSIS — I2510 Atherosclerotic heart disease of native coronary artery without angina pectoris: Secondary | ICD-10-CM | POA: Diagnosis not present

## 2022-04-20 DIAGNOSIS — J988 Other specified respiratory disorders: Secondary | ICD-10-CM | POA: Diagnosis not present

## 2022-04-20 DIAGNOSIS — I7090 Unspecified atherosclerosis: Secondary | ICD-10-CM | POA: Diagnosis not present

## 2022-04-20 DIAGNOSIS — I129 Hypertensive chronic kidney disease with stage 1 through stage 4 chronic kidney disease, or unspecified chronic kidney disease: Secondary | ICD-10-CM | POA: Diagnosis not present

## 2022-04-20 DIAGNOSIS — R918 Other nonspecific abnormal finding of lung field: Secondary | ICD-10-CM | POA: Diagnosis not present

## 2022-04-20 DIAGNOSIS — M7989 Other specified soft tissue disorders: Secondary | ICD-10-CM | POA: Diagnosis not present

## 2022-04-20 DIAGNOSIS — Z888 Allergy status to other drugs, medicaments and biological substances status: Secondary | ICD-10-CM | POA: Diagnosis not present

## 2022-04-20 DIAGNOSIS — J470 Bronchiectasis with acute lower respiratory infection: Secondary | ICD-10-CM | POA: Diagnosis not present

## 2022-04-20 DIAGNOSIS — R5381 Other malaise: Secondary | ICD-10-CM | POA: Diagnosis not present

## 2022-04-20 DIAGNOSIS — Z86711 Personal history of pulmonary embolism: Secondary | ICD-10-CM | POA: Diagnosis not present

## 2022-04-21 DIAGNOSIS — I4891 Unspecified atrial fibrillation: Secondary | ICD-10-CM | POA: Diagnosis not present

## 2022-04-21 DIAGNOSIS — J690 Pneumonitis due to inhalation of food and vomit: Secondary | ICD-10-CM | POA: Diagnosis not present

## 2022-04-21 DIAGNOSIS — A419 Sepsis, unspecified organism: Secondary | ICD-10-CM | POA: Diagnosis not present

## 2022-04-21 DIAGNOSIS — J9601 Acute respiratory failure with hypoxia: Secondary | ICD-10-CM | POA: Diagnosis not present

## 2022-04-21 DIAGNOSIS — J988 Other specified respiratory disorders: Secondary | ICD-10-CM | POA: Diagnosis not present

## 2022-04-22 DIAGNOSIS — I4891 Unspecified atrial fibrillation: Secondary | ICD-10-CM | POA: Diagnosis not present

## 2022-04-22 DIAGNOSIS — J988 Other specified respiratory disorders: Secondary | ICD-10-CM | POA: Diagnosis not present

## 2022-04-22 DIAGNOSIS — J9601 Acute respiratory failure with hypoxia: Secondary | ICD-10-CM | POA: Diagnosis not present

## 2022-04-22 DIAGNOSIS — A419 Sepsis, unspecified organism: Secondary | ICD-10-CM | POA: Diagnosis not present

## 2022-04-22 DIAGNOSIS — J690 Pneumonitis due to inhalation of food and vomit: Secondary | ICD-10-CM | POA: Diagnosis not present

## 2022-04-23 DIAGNOSIS — J9601 Acute respiratory failure with hypoxia: Secondary | ICD-10-CM | POA: Diagnosis not present

## 2022-04-23 DIAGNOSIS — J690 Pneumonitis due to inhalation of food and vomit: Secondary | ICD-10-CM | POA: Diagnosis not present

## 2022-04-23 DIAGNOSIS — Z9981 Dependence on supplemental oxygen: Secondary | ICD-10-CM | POA: Diagnosis not present

## 2022-04-25 DIAGNOSIS — J9601 Acute respiratory failure with hypoxia: Secondary | ICD-10-CM | POA: Diagnosis not present

## 2022-04-25 DIAGNOSIS — J439 Emphysema, unspecified: Secondary | ICD-10-CM | POA: Diagnosis not present

## 2022-04-25 DIAGNOSIS — M6281 Muscle weakness (generalized): Secondary | ICD-10-CM | POA: Diagnosis not present

## 2022-04-25 DIAGNOSIS — J690 Pneumonitis due to inhalation of food and vomit: Secondary | ICD-10-CM | POA: Diagnosis not present

## 2022-04-25 DIAGNOSIS — R5381 Other malaise: Secondary | ICD-10-CM | POA: Diagnosis not present

## 2022-04-25 DIAGNOSIS — I2510 Atherosclerotic heart disease of native coronary artery without angina pectoris: Secondary | ICD-10-CM | POA: Diagnosis not present

## 2022-04-25 DIAGNOSIS — R279 Unspecified lack of coordination: Secondary | ICD-10-CM | POA: Diagnosis not present

## 2022-04-25 DIAGNOSIS — N183 Chronic kidney disease, stage 3 unspecified: Secondary | ICD-10-CM | POA: Diagnosis not present

## 2022-04-29 DEATH — deceased
